# Patient Record
Sex: Female | Born: 1945 | Race: Black or African American | Hispanic: No | Marital: Single | Smoking: Never smoker
Health system: Southern US, Community
[De-identification: ages and names within clinical notes are randomized; demographics above are authoritative.]

## PROBLEM LIST (undated history)

## (undated) DIAGNOSIS — F419 Anxiety disorder, unspecified: Secondary | ICD-10-CM

## (undated) DIAGNOSIS — I499 Cardiac arrhythmia, unspecified: Secondary | ICD-10-CM

## (undated) DIAGNOSIS — K219 Gastro-esophageal reflux disease without esophagitis: Secondary | ICD-10-CM

## (undated) DIAGNOSIS — R011 Cardiac murmur, unspecified: Secondary | ICD-10-CM

## (undated) DIAGNOSIS — F32A Depression, unspecified: Secondary | ICD-10-CM

## (undated) DIAGNOSIS — G4730 Sleep apnea, unspecified: Secondary | ICD-10-CM

## (undated) DIAGNOSIS — E785 Hyperlipidemia, unspecified: Secondary | ICD-10-CM

## (undated) DIAGNOSIS — I10 Essential (primary) hypertension: Secondary | ICD-10-CM

## (undated) DIAGNOSIS — F329 Major depressive disorder, single episode, unspecified: Secondary | ICD-10-CM

## (undated) DIAGNOSIS — M109 Gout, unspecified: Secondary | ICD-10-CM

## (undated) HISTORY — DX: Major depressive disorder, single episode, unspecified: F32.9

## (undated) HISTORY — DX: Sleep apnea, unspecified: G47.30

## (undated) HISTORY — DX: Anxiety disorder, unspecified: F41.9

## (undated) HISTORY — PX: BREAST EXCISIONAL BIOPSY: SUR124

## (undated) HISTORY — DX: Gastro-esophageal reflux disease without esophagitis: K21.9

## (undated) HISTORY — DX: Gout, unspecified: M10.9

## (undated) HISTORY — DX: Essential (primary) hypertension: I10

## (undated) HISTORY — PX: ABDOMINAL HYSTERECTOMY: SHX81

## (undated) HISTORY — PX: CORNEAL TRANSPLANT: SHX108

## (undated) HISTORY — DX: Depression, unspecified: F32.A

## (undated) HISTORY — DX: Hyperlipidemia, unspecified: E78.5

---

## 1984-02-27 HISTORY — PX: PARTIAL HYSTERECTOMY: SHX80

## 2001-04-25 ENCOUNTER — Other Ambulatory Visit: Admission: RE | Admit: 2001-04-25 | Discharge: 2001-04-25 | Payer: Self-pay | Admitting: Obstetrics and Gynecology

## 2001-12-29 ENCOUNTER — Encounter: Admission: RE | Admit: 2001-12-29 | Discharge: 2002-03-29 | Payer: Self-pay | Admitting: Family Medicine

## 2002-06-22 ENCOUNTER — Other Ambulatory Visit: Admission: RE | Admit: 2002-06-22 | Discharge: 2002-06-22 | Payer: Self-pay | Admitting: Obstetrics and Gynecology

## 2003-07-02 ENCOUNTER — Other Ambulatory Visit: Admission: RE | Admit: 2003-07-02 | Discharge: 2003-07-02 | Payer: Self-pay | Admitting: Obstetrics and Gynecology

## 2004-07-07 ENCOUNTER — Other Ambulatory Visit: Admission: RE | Admit: 2004-07-07 | Discharge: 2004-07-07 | Payer: Self-pay | Admitting: Obstetrics and Gynecology

## 2004-07-07 ENCOUNTER — Ambulatory Visit (HOSPITAL_COMMUNITY): Admission: RE | Admit: 2004-07-07 | Discharge: 2004-07-07 | Payer: Self-pay | Admitting: Obstetrics and Gynecology

## 2005-02-26 HISTORY — PX: BACK SURGERY: SHX140

## 2005-07-10 ENCOUNTER — Encounter: Admission: RE | Admit: 2005-07-10 | Discharge: 2005-07-10 | Payer: Self-pay | Admitting: Family Medicine

## 2005-07-10 ENCOUNTER — Ambulatory Visit (HOSPITAL_COMMUNITY): Admission: RE | Admit: 2005-07-10 | Discharge: 2005-07-10 | Payer: Self-pay | Admitting: Obstetrics and Gynecology

## 2005-07-12 ENCOUNTER — Other Ambulatory Visit: Admission: RE | Admit: 2005-07-12 | Discharge: 2005-07-12 | Payer: Self-pay | Admitting: Obstetrics and Gynecology

## 2005-09-06 ENCOUNTER — Encounter: Admission: RE | Admit: 2005-09-06 | Discharge: 2005-09-06 | Payer: Self-pay | Admitting: Family Medicine

## 2005-09-14 ENCOUNTER — Ambulatory Visit (HOSPITAL_COMMUNITY): Admission: RE | Admit: 2005-09-14 | Discharge: 2005-09-16 | Payer: Self-pay | Admitting: Neurosurgery

## 2006-07-23 ENCOUNTER — Ambulatory Visit (HOSPITAL_COMMUNITY): Admission: RE | Admit: 2006-07-23 | Discharge: 2006-07-23 | Payer: Self-pay | Admitting: Obstetrics and Gynecology

## 2006-08-20 ENCOUNTER — Ambulatory Visit: Payer: Self-pay | Admitting: Gastroenterology

## 2006-08-28 ENCOUNTER — Encounter: Admission: RE | Admit: 2006-08-28 | Discharge: 2006-08-28 | Payer: Self-pay | Admitting: Obstetrics and Gynecology

## 2006-09-02 ENCOUNTER — Encounter: Payer: Self-pay | Admitting: Gastroenterology

## 2006-09-02 ENCOUNTER — Ambulatory Visit: Payer: Self-pay | Admitting: Gastroenterology

## 2007-07-29 ENCOUNTER — Encounter: Admission: RE | Admit: 2007-07-29 | Discharge: 2007-07-29 | Payer: Self-pay | Admitting: Obstetrics and Gynecology

## 2008-08-03 ENCOUNTER — Encounter: Admission: RE | Admit: 2008-08-03 | Discharge: 2008-08-03 | Payer: Self-pay | Admitting: Obstetrics and Gynecology

## 2009-09-14 ENCOUNTER — Encounter: Admission: RE | Admit: 2009-09-14 | Discharge: 2009-09-14 | Payer: Self-pay | Admitting: Obstetrics and Gynecology

## 2009-12-19 ENCOUNTER — Encounter: Admission: RE | Admit: 2009-12-19 | Discharge: 2009-12-19 | Payer: Self-pay | Admitting: Family Medicine

## 2010-01-24 DIAGNOSIS — K219 Gastro-esophageal reflux disease without esophagitis: Secondary | ICD-10-CM | POA: Insufficient documentation

## 2010-02-14 ENCOUNTER — Ambulatory Visit: Payer: Self-pay | Admitting: Cardiovascular Disease

## 2010-02-14 LAB — LIPID PANEL
Cholesterol: 217 mg/dL — AB (ref 0–200)
LDL Cholesterol: 135 mg/dL
Triglycerides: 103 mg/dL (ref 40–160)

## 2010-05-03 ENCOUNTER — Encounter: Payer: Self-pay | Admitting: Cardiovascular Disease

## 2010-05-03 DIAGNOSIS — E785 Hyperlipidemia, unspecified: Secondary | ICD-10-CM | POA: Insufficient documentation

## 2010-05-03 DIAGNOSIS — F329 Major depressive disorder, single episode, unspecified: Secondary | ICD-10-CM | POA: Insufficient documentation

## 2010-05-03 DIAGNOSIS — E1159 Type 2 diabetes mellitus with other circulatory complications: Secondary | ICD-10-CM | POA: Insufficient documentation

## 2010-05-03 DIAGNOSIS — E119 Type 2 diabetes mellitus without complications: Secondary | ICD-10-CM | POA: Insufficient documentation

## 2010-05-03 DIAGNOSIS — I10 Essential (primary) hypertension: Secondary | ICD-10-CM | POA: Insufficient documentation

## 2010-05-03 DIAGNOSIS — F32A Depression, unspecified: Secondary | ICD-10-CM | POA: Insufficient documentation

## 2010-05-26 ENCOUNTER — Ambulatory Visit: Payer: Self-pay | Admitting: Cardiovascular Disease

## 2010-06-28 ENCOUNTER — Encounter: Payer: Self-pay | Admitting: Cardiovascular Disease

## 2010-07-14 NOTE — Op Note (Signed)
NAMECHEE, DIMON            ACCOUNT NO.:  [id number]   MEDICAL RECORD NO.:  [id number]          PATIENT TYPE:  OIB   LOCATION:  3172                         FACILITY:  [facility]   PHYSICIAN:  Hilda Lias, M.D.   DATE OF BIRTH:  1945-03-29   DATE OF PROCEDURE:  09/14/2005  DATE OF DISCHARGE:                                 OPERATIVE REPORT   HISTORY:  Ms. Wander is a lady who came to see me in my office yesterday  because of back pain, distal both legs up to the point that she was losing  control of the left foot.  Also, she was having some difficulty with bowel  and bladder.  The patient had MRI and because of the findings, we decided to  go ahead with surgery.   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   PROCEDURE:  1.  L4-L5 laminotomy bilaterally.  2.  Bilateral L4-L5 diskectomy.  3.  Foraminotomy.   SURGEON:  Hilda Lias, M.D.   DESCRIPTION OF PROCEDURE:  The patient was taken to the OR.  She was  positioned on operating room table.  The back was sprayed with DuraPrep.  A  midline incision was made from L4 to L5.  Muscles were retracted laterally.  X-rays showed that indeed we were at the L4-L5 level.  We brought the  microscope into the area.  The canal was quite vertical.  Still using  Kerrison punch we drilled the lower lamina of L4 and the upper of L5.  A  thick yellow ligament was also excised.  Immediately once we saw the dural  sac, it was really tight, it ws difficult to get into the disk space.  Finally, after retraction, we were able to displace slightly the L5 nerve  root medially and we entered the disk space.  A large amount of  degenerative disk was removed.  There was a large osteophyte.  Then we went  to the right side and a L4-L5 foraminotomy was accomplished.  Retraction was  done and then a  large amount of disk was removed.  Having done this, we went  once more into the left side.  Now we have more easily access to the midline  and total gross diskectomy was accomplished.  Foraminotomy was done and  there was plenty of room not only for the thecal sac but also for the L4 and  L5 nerve root.  Valsalva maneuver was done.  Fentanyl and Depo-Medrol were  left in the dural space and the wound was closed with Vicryl and Steri-  Strips.           ______________________________  Hilda Lias, M.D.     EB/MEDQ  D:  09/14/2005  T:  09/14/2005  Job:  [id number]   cc:   Dante Gang, MD

## 2010-09-13 ENCOUNTER — Other Ambulatory Visit: Payer: Self-pay | Admitting: Cardiovascular Disease

## 2010-09-14 ENCOUNTER — Other Ambulatory Visit: Payer: Self-pay | Admitting: *Deleted

## 2010-09-14 MED ORDER — NIACIN ER (ANTIHYPERLIPIDEMIC) 1000 MG PO TBCR: 1000.0000 mg | EXTENDED_RELEASE_TABLET | Freq: Every day | ORAL | Status: DC

## 2010-09-14 NOTE — Telephone Encounter (Signed)
Fax received from pharmacy. Refill completed. Jodette Jenise Iannelli RN  

## 2010-09-27 ENCOUNTER — Other Ambulatory Visit: Payer: Self-pay | Admitting: Obstetrics and Gynecology

## 2010-09-27 DIAGNOSIS — Z1231 Encounter for screening mammogram for malignant neoplasm of breast: Secondary | ICD-10-CM

## 2010-10-09 ENCOUNTER — Ambulatory Visit
Admission: RE | Admit: 2010-10-09 | Discharge: 2010-10-09 | Disposition: A | Discharge status: Home / Self Care | Payer: Medicare Other | Source: Ambulatory Visit | Attending: Obstetrics and Gynecology | Admitting: Obstetrics and Gynecology

## 2010-10-09 DIAGNOSIS — Z1231 Encounter for screening mammogram for malignant neoplasm of breast: Secondary | ICD-10-CM

## 2010-10-25 ENCOUNTER — Ambulatory Visit: Payer: Self-pay | Admitting: Cardiovascular Disease

## 2011-10-15 ENCOUNTER — Other Ambulatory Visit: Payer: Self-pay | Admitting: Obstetrics and Gynecology

## 2011-10-15 DIAGNOSIS — Z1231 Encounter for screening mammogram for malignant neoplasm of breast: Secondary | ICD-10-CM

## 2011-11-01 ENCOUNTER — Ambulatory Visit
Admission: RE | Admit: 2011-11-01 | Discharge: 2011-11-01 | Disposition: A | Discharge status: Home / Self Care | Payer: Medicare Other | Source: Ambulatory Visit | Attending: Obstetrics and Gynecology | Admitting: Obstetrics and Gynecology

## 2011-11-01 DIAGNOSIS — Z1231 Encounter for screening mammogram for malignant neoplasm of breast: Secondary | ICD-10-CM

## 2011-11-09 ENCOUNTER — Encounter: Payer: Self-pay | Admitting: Obstetrics and Gynecology

## 2011-11-13 ENCOUNTER — Encounter: Payer: Self-pay | Admitting: Obstetrics and Gynecology

## 2011-11-14 ENCOUNTER — Ambulatory Visit (INDEPENDENT_AMBULATORY_CARE_PROVIDER_SITE_OTHER): Payer: Medicare Other | Admitting: Obstetrics and Gynecology

## 2011-11-14 ENCOUNTER — Encounter: Payer: Self-pay | Admitting: Obstetrics and Gynecology

## 2011-11-14 VITALS — BP 108/64 | HR 86 | Ht 60.0 in | Wt 180.0 lb

## 2011-11-14 DIAGNOSIS — Z124 Encounter for screening for malignant neoplasm of cervix: Secondary | ICD-10-CM

## 2011-11-14 NOTE — Progress Notes (Signed)
Last Pap: 07/2008 WNL: Yes Regular Periods:no Contraception: hyst/postmenopause  Monthly Breast exam:yes Tetanus<40yrs:no Nl.Bladder Function:yes Daily BMs:yes Healthy Diet:yes Calcium:yes Mammogram:yes Date of Mammogram: 11/02/11 Exercise:yes Have often Exercise: 2-3 times per week  Seatbelt: yes Abuse at home: no Stressful work:no Sigmoid-colonoscopy: 2 years ago Bone Density: Yes 2 years ago PCP: Dr. Dario Guardian in [place] Change in PMH: none Change in WJX:BJYN BP 108/64  Pulse 86  Ht 5' (1.524 m)  Wt 180 lb (81.647 kg)  BMI 35.15 kg/m2 Physical Examination: Neck - supple, no significant adenopathy Chest - clear to auscultation, no wheezes, rales or rhonchi, symmetric air entry Heart - normal rate, regular rhythm, normal S1, S2, no murmurs, rubs, clicks or gallops Abdomen - soft, nontender, nondistended, no masses or organomegaly Breasts - breasts appear normal, no suspicious masses, no skin or nipple changes or axillary nodes Pelvic - normal external genitalia, vulva, vagina, cervix, uterus and adnexa, atrophic Rectal - normal rectal, no masses Musculoskeletal - no joint tenderness, deformity or swelling Extremities - peripheral pulses normal, no pedal edema, no clubbing or cyanosis Skin - normal coloration and turgor, no rashes, no suspicious skin lesions noted Normal AEX Pt due for mammogram no colonoscopy due no Pap done yes last one if normal RT one year Diet and exercise discussed Pt has some skin lightening on her face.  Pt referred to dermatology

## 2011-11-14 NOTE — Patient Instructions (Signed)
Menopause and Herbal Products Menopause is the normal time of life when menstrual periods stop completely. Menopause is complete when you have missed 12 consecutive menstrual periods. It usually occurs between the ages of 48 to 55, with an average age of 51. Very rarely does a woman develop menopause before 65 years old. At menopause, your ovaries stop producing the female hormones, estrogen and progesterone. This can cause undesirable symptoms and also affect your health. Sometimes the symptoms can occur 4 to 5 years before the menopause begins. There is no relationship between menopause and:  Oral contraceptives.   Number of children you had.   Race.   The age your menstrual periods started (menarche).  Heavy smokers and very thin women may develop menopause earlier in life. Estrogen and progesterone hormone treatment is the usual method of treating menopausal symptoms. However, there are women who should not take hormone treatment. This is true of:   Women that have breast or uterine cancer.   Women who prefer not to take hormones because of certain side effects (abnormal uterine bleeding).   Women who are afraid that hormones may cause breast cancer.   Women who have a history of liver disease, heart disease, stroke, or blood clots.  For these women, there are other medications that may help treat their menopausal symptoms. These medications are found in plants and botanical products. They can be found in the form of herbs, teas, oils, tinctures, and pills.  CAUSES:  The ovaries stop producing the female hormones estrogen and progesterone.   Other causes include:   Surgery to remove both ovaries.   The ovaries stop functioning for no know reason.   Tumors of the pituitary gland in the brain.   Medical disease that affects the ovaries and hormone production.   Radiation treatment to the abdomen or pelvis.   Chemotherapy that affects the ovaries.   PHYTOESTROGENS: Phytoestrogen's occur naturally in plants and plant products. They act like estrogen in the body. Herbal medications are made from these plants and botanical steroids. There are 3 types of phytoestrogens:  Isoflavones (genistein and daidzein) are found in soy, garbanzo beans, miso and tofu foods.   Ligins are found in the shell of seeds. They are used to make oils like flaxseed oil. The bacteria in your intestine act on these foods to produce the estrogen-like hormones.   Coumestans are estrogen-like. Some of the foods they are found in include sunflower seeds and bean sprouts.  CONDITIONS AND THERE POSSIBLE HERBAL TREATMENT:  Hot flashes and night sweats.   Soy, black cohosh and evening primrose.   Irritability, insomnia, depression and memory problems.   Chasteberry, ginseng, and soy.   St. John's wort may be helpful for depression. However, there is a concern of it causing cataracts of the eye and may have bad effects on other medications. St. John's wort should not be taken for long time and without your caregiver's advice.   Loss of libido and vaginal and skin dryness.   Wild yam and soy.   Prevention of coronary heart disease and osteoporosis.   Soy and Isoflavones.  Several studies have shown that some women benefit from herbal medications, but most of the studies have not consistently shown that these supplements are much better than placebo. Other forms of treatment to help women with menopausal symptoms include a balanced diet, rest, exercise, vitamin and calcium (with vitamin D) supplements, acupuncture, and group therapy when necessary. THOSE WHO SHOULD NOT TAKE HERBAL MEDICATIONS INCLUDE:    Women who are planning on getting pregnant unless told by your caregiver.   Women who are breastfeeding unless told by your caregiver.   Women who are taking other prescription medications unless told by your caregiver.   Infants, children, and elderly women unless  told by your caregiver.  Different herbal medications have different and unmeasured amounts of the herbal ingredients. There are no regulations, quality control, and standardization of the ingredients in herbal medications. Therefore, the amount of the ingredient in the medication may vary from one herb, pill, tea, oil or tincture to another. Many herbal medications can cause serious problems and can even have poisonous effects if taken too much or too long. If problems develop, the medication should be stopped and recorded by your caregiver. HOME CARE INSTRUCTIONS  Do not take or give children herbal medications without your caregiver's advice.   Let your caregiver know all the medications you are taking. This includes prescription, over-the-counter, eye drops, and creams.   Do not take herbal medications longer or more than recommended.   Tell your caregiver about any side effects from the medication.  SEEK MEDICAL CARE IF:  You develop a fever of 102 F (38.9 C), or as directed by your caregiver.   You feel sick to your stomach (nauseous), vomit, or have diarrhea.   You develop a rash.   You develop abdominal pain.   You develop severe headaches.   You start to have vision problems.   You feel dizzy or faint.   You start to feel numbness in any part of your body.   You start shaking (have convulsions).  Document Released: 08/01/2007 Document Revised: 02/01/2011 Document Reviewed: 02/28/2010 [employer] Patient Information 2012 [employer]. 

## 2011-11-16 LAB — PAP IG W/ RFLX HPV ASCU

## 2011-12-04 ENCOUNTER — Telehealth: Payer: Self-pay

## 2011-12-04 NOTE — Telephone Encounter (Signed)
Message copied by Rolla Plate on Tue Dec 04, 2011 12:56 PM ------      Message from: Jaymes Graff      Created: Sun Dec 02, 2011  9:24 PM       Please tell pt BV was found on her pap smear.  She can be treated with either Metrogel one applicator in vagina QHS for five nights or Flagyl 500mg  one tablet twice a day for seven days.

## 2011-12-04 NOTE — Telephone Encounter (Signed)
Lm on vm tcb rgd labs 

## 2011-12-05 NOTE — Telephone Encounter (Signed)
Lm on vm tcb rgd labs 

## 2011-12-06 NOTE — Telephone Encounter (Signed)
Lm on vm tcb rgd labs 

## 2011-12-07 MED ORDER — METRONIDAZOLE 500 MG PO TABS: 500.0000 mg | ORAL_TABLET | Freq: Two times a day (BID) | ORAL | Status: DC

## 2011-12-07 NOTE — Telephone Encounter (Signed)
Lm on vm tcb rgd labs 

## 2011-12-07 NOTE — Telephone Encounter (Signed)
Spoke with pt rgd pap results informed bv on pap need rx advised pt rx sent to pharm pt voice understanding

## 2012-10-08 ENCOUNTER — Other Ambulatory Visit: Payer: Self-pay

## 2012-10-08 DIAGNOSIS — Z1231 Encounter for screening mammogram for malignant neoplasm of breast: Secondary | ICD-10-CM

## 2012-11-03 ENCOUNTER — Ambulatory Visit
Admission: RE | Admit: 2012-11-03 | Discharge: 2012-11-03 | Disposition: A | Discharge status: Home / Self Care | Payer: Medicare Other | Source: Ambulatory Visit

## 2012-11-03 DIAGNOSIS — Z1231 Encounter for screening mammogram for malignant neoplasm of breast: Secondary | ICD-10-CM

## 2013-04-06 ENCOUNTER — Encounter (HOSPITAL_COMMUNITY): Payer: Self-pay | Admitting: Emergency Medicine

## 2013-04-06 ENCOUNTER — Emergency Department (HOSPITAL_COMMUNITY)
Admission: EM | Admit: 2013-04-06 | Discharge: 2013-04-06 | Disposition: A | Discharge status: Home / Self Care | Payer: No Typology Code available for payment source | Attending: Emergency Medicine | Admitting: Emergency Medicine

## 2013-04-06 ENCOUNTER — Emergency Department (HOSPITAL_COMMUNITY): Payer: No Typology Code available for payment source

## 2013-04-06 DIAGNOSIS — E785 Hyperlipidemia, unspecified: Secondary | ICD-10-CM | POA: Diagnosis not present

## 2013-04-06 DIAGNOSIS — I10 Essential (primary) hypertension: Secondary | ICD-10-CM | POA: Insufficient documentation

## 2013-04-06 DIAGNOSIS — Z79899 Other long term (current) drug therapy: Secondary | ICD-10-CM | POA: Diagnosis not present

## 2013-04-06 DIAGNOSIS — F329 Major depressive disorder, single episode, unspecified: Secondary | ICD-10-CM | POA: Diagnosis not present

## 2013-04-06 DIAGNOSIS — IMO0002 Reserved for concepts with insufficient information to code with codable children: Secondary | ICD-10-CM | POA: Insufficient documentation

## 2013-04-06 DIAGNOSIS — E119 Type 2 diabetes mellitus without complications: Secondary | ICD-10-CM | POA: Insufficient documentation

## 2013-04-06 DIAGNOSIS — S060X9A Concussion with loss of consciousness of unspecified duration, initial encounter: Secondary | ICD-10-CM

## 2013-04-06 DIAGNOSIS — F3289 Other specified depressive episodes: Secondary | ICD-10-CM | POA: Insufficient documentation

## 2013-04-06 DIAGNOSIS — F411 Generalized anxiety disorder: Secondary | ICD-10-CM | POA: Diagnosis not present

## 2013-04-06 DIAGNOSIS — Y9389 Activity, other specified: Secondary | ICD-10-CM | POA: Insufficient documentation

## 2013-04-06 DIAGNOSIS — Y92410 Unspecified street and highway as the place of occurrence of the external cause: Secondary | ICD-10-CM | POA: Diagnosis not present

## 2013-04-06 DIAGNOSIS — F29 Unspecified psychosis not due to a substance or known physiological condition: Secondary | ICD-10-CM | POA: Insufficient documentation

## 2013-04-06 DIAGNOSIS — S060XAA Concussion with loss of consciousness status unknown, initial encounter: Secondary | ICD-10-CM

## 2013-04-06 DIAGNOSIS — S060X0A Concussion without loss of consciousness, initial encounter: Secondary | ICD-10-CM | POA: Diagnosis not present

## 2013-04-06 DIAGNOSIS — S0990XA Unspecified injury of head, initial encounter: Secondary | ICD-10-CM | POA: Diagnosis present

## 2013-04-06 LAB — CBC WITH DIFFERENTIAL/PLATELET
Basophils Absolute: 0 10*3/uL (ref 0.0–0.1)
Basophils Relative: 1 % (ref 0–1)
Eosinophils Absolute: 0.4 10*3/uL (ref 0.0–0.7)
Eosinophils Relative: 6 % — ABNORMAL HIGH (ref 0–5)
HCT: 40.5 % (ref 36.0–46.0)
Hemoglobin: 13.5 g/dL (ref 12.0–15.0)
LYMPHS ABS: 2.9 10*3/uL (ref 0.7–4.0)
LYMPHS PCT: 45 % (ref 12–46)
MCH: 31.1 pg (ref 26.0–34.0)
MCHC: 33.3 g/dL (ref 30.0–36.0)
MCV: 93.3 fL (ref 78.0–100.0)
Monocytes Absolute: 0.6 10*3/uL (ref 0.1–1.0)
Monocytes Relative: 9 % (ref 3–12)
NEUTROS PCT: 40 % — AB (ref 43–77)
Neutro Abs: 2.6 10*3/uL (ref 1.7–7.7)
PLATELETS: 389 10*3/uL (ref 150–400)
RBC: 4.34 MIL/uL (ref 3.87–5.11)
RDW: 14.8 % (ref 11.5–15.5)
WBC: 6.4 10*3/uL (ref 4.0–10.5)

## 2013-04-06 LAB — COMPREHENSIVE METABOLIC PANEL
ALK PHOS: 73 U/L (ref 39–117)
ALT: 28 U/L (ref 0–35)
AST: 31 U/L (ref 0–37)
Albumin: 3.9 g/dL (ref 3.5–5.2)
BUN: 12 mg/dL (ref 6–23)
CHLORIDE: 104 meq/L (ref 96–112)
CO2: 22 meq/L (ref 19–32)
Calcium: 9.5 mg/dL (ref 8.4–10.5)
Creatinine, Ser: 0.78 mg/dL (ref 0.50–1.10)
GFR calc Af Amer: >90 mL/min (ref >90)
GFR calc non Af Amer: 85 mL/min — ABNORMAL LOW (ref >90)
Glucose, Bld: 176 mg/dL — ABNORMAL HIGH (ref 70–99)
Potassium: 4.7 mEq/L (ref 3.7–5.3)
SODIUM: 142 meq/L (ref 137–147)
Total Bilirubin: 0.4 mg/dL (ref 0.3–1.2)
Total Protein: 7.8 g/dL (ref 6.0–8.3)

## 2013-04-06 LAB — ETHANOL: Alcohol, Ethyl (B): <11 mg/dL (ref 0–11)

## 2013-04-06 MED ORDER — SODIUM CHLORIDE 0.9 % IV BOLUS (SEPSIS)
1000.0000 mL | Freq: Once | INTRAVENOUS | Status: AC
  Administered 2013-04-06: 1000 mL via INTRAVENOUS

## 2013-04-06 MED ORDER — DIAZEPAM 5 MG PO TABS
5.0000 mg | ORAL_TABLET | Freq: Once | ORAL | Status: AC
  Administered 2013-04-06: 5 mg via ORAL
  Filled 2013-04-06: qty 1

## 2013-04-06 MED ORDER — HYDROCODONE-ACETAMINOPHEN 5-325 MG PO TABS: 1.0000 | ORAL_TABLET | ORAL | Status: DC | PRN

## 2013-04-06 MED ORDER — KETOROLAC TROMETHAMINE 30 MG/ML IJ SOLN
30.0000 mg | Freq: Once | INTRAMUSCULAR | Status: AC
  Administered 2013-04-06: 30 mg via INTRAVENOUS
  Filled 2013-04-06: qty 1

## 2013-04-06 NOTE — ED Notes (Signed)
Dr. Floyd at the bedside.  

## 2013-04-06 NOTE — ED Notes (Signed)
Pt to CT

## 2013-04-06 NOTE — ED Provider Notes (Signed)
I saw and evaluated the patient, reviewed the resident's note and I agree with the findings and plan.  EKG Interpretation   None       Mental status improving.  Patient discharged home.  Likely concussion.  CT head C-spine normal per chest x-ray normal.  Repeat abdominal exam is benign.  Ambulatory  Hoy Morn, MD 04/06/13 [phone]
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
## 2013-04-06 NOTE — Discharge Instructions (Signed)

## 2013-04-06 NOTE — ED Notes (Signed)
Pt ambulated with mim assistance.
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
## 2013-04-06 NOTE — ED Notes (Signed)
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

## 2013-04-06 NOTE — ED Notes (Signed)
Pt ambulating in room with asst.

## 2013-12-23 ENCOUNTER — Other Ambulatory Visit: Payer: Self-pay

## 2013-12-23 DIAGNOSIS — Z1231 Encounter for screening mammogram for malignant neoplasm of breast: Secondary | ICD-10-CM

## 2013-12-28 ENCOUNTER — Encounter (HOSPITAL_COMMUNITY): Payer: Self-pay | Admitting: Emergency Medicine

## 2014-01-08 ENCOUNTER — Ambulatory Visit
Admission: RE | Admit: 2014-01-08 | Discharge: 2014-01-08 | Disposition: A | Discharge status: Home / Self Care | Payer: Medicare Other | Source: Ambulatory Visit

## 2014-01-08 ENCOUNTER — Encounter (INDEPENDENT_AMBULATORY_CARE_PROVIDER_SITE_OTHER): Payer: Self-pay

## 2014-01-08 DIAGNOSIS — Z1231 Encounter for screening mammogram for malignant neoplasm of breast: Secondary | ICD-10-CM

## 2014-08-05 DIAGNOSIS — Z947 Corneal transplant status: Secondary | ICD-10-CM | POA: Insufficient documentation

## 2015-09-09 ENCOUNTER — Ambulatory Visit: Payer: Medicare Other | Attending: Internal Medicine

## 2015-09-09 DIAGNOSIS — R0683 Snoring: Secondary | ICD-10-CM | POA: Diagnosis not present

## 2015-09-09 DIAGNOSIS — G4733 Obstructive sleep apnea (adult) (pediatric): Secondary | ICD-10-CM | POA: Insufficient documentation

## 2016-08-16 ENCOUNTER — Encounter: Payer: Self-pay | Admitting: Gastroenterology

## 2017-04-23 ENCOUNTER — Encounter: Payer: Self-pay | Admitting: Urology

## 2017-04-23 ENCOUNTER — Ambulatory Visit (INDEPENDENT_AMBULATORY_CARE_PROVIDER_SITE_OTHER): Payer: Medicare Other | Admitting: Urology

## 2017-04-23 VITALS — BP 155/79 | HR 76 | Ht 60.0 in | Wt 185.0 lb

## 2017-04-23 DIAGNOSIS — N3944 Nocturnal enuresis: Secondary | ICD-10-CM

## 2017-04-23 DIAGNOSIS — R351 Nocturia: Secondary | ICD-10-CM

## 2017-04-23 LAB — BLADDER SCAN AMB NON-IMAGING: Scan Result: 0

## 2017-04-23 NOTE — Progress Notes (Signed)
04/23/2017 3:49 PM   Madeline Martinez 07-Nov-1945 [id number]  Referring provider: [name], [name], [place] [address]  Chief Complaint  Patient presents with  . New Patient (Initial Visit)    HPI: Patient is a 72 -year-old African-American female who is referred to [place] by Dr.[name] for urinary incontinence with her daughter, Madeline Martinez.    She is not a clear historian.  She has difficulty with the specifics of her condition.  Patient states that she has had urinary incontinence for awhile.  Patient has incontinence at night.  She states she just wakes up wet.  She denies any issues during the day.  She is experiencing a strong urgency and nocturia x 2.    Her incontinence volume is moderate to large.   She states that she has not had any night time incontinence for the last two weeks.  Her recent HbgA1c was found to be 10% which is up from 7% three months ago.  She has had some recent changes with her DM medications.    She was told by her gynecologist that she had an UTI, but she denied any symptoms of dysuria, fevers, gross hematuria.  Patient denies any gross hematuria, dysuria or suprapubic/flank pain.  Patient denies any fevers, chills, nausea or vomiting.   She is post menopausal.   She denies constipation and/or diarrhea.   She has not had any recent imaging studies.    She is drinking a lot of water daily into the night.  She is drinking two caffeinated beverages daily.  She is not drinking alcoholic beverages daily.   She has sleep apnea, but she does admit that she sometimes does not sleep with her CPAP machine.     Her PVR is 0 mL.     PMH: Past Medical History:  Diagnosis Date  . Acid reflux   . Anxiety   . Depression   . Diabetes mellitus   . Gout   . HTN (hypertension)   . Hyperlipidemia   . Sleep apnea     Surgical History: Past Surgical History:  Procedure Laterality Date  . ABDOMINAL HYSTERECTOMY    . BACK SURGERY  2007   DISCS   . 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 PARTIAL HYSTERECTOMY  1986    Home Medications:  Allergies as of 04/23/2017   No Known Allergies     Medication List        Accurate as of 04/23/17  3:49 PM. Always use your most recent med list.          albuterol 108 (90 Base) MCG/ACT inhaler Commonly known as:  PROVENTIL HFA;VENTOLIN HFA Inhale 2 puffs into the lungs every 6 (six) hours as needed for wheezing or shortness of breath.   allopurinol 100 MG tablet Commonly known as:  ZYLOPRIM allopurinol 100 mg tablet   atorvastatin 80 MG tablet Commonly known as:  LIPITOR Take 80 mg by mouth daily.   buPROPion 150 MG 24 hr tablet Commonly known as:  WELLBUTRIN XL Take 300 mg by mouth daily.   carvedilol 25 MG tablet Commonly known as:  COREG Take 25 mg by mouth 2 (two) times daily.   cetirizine 10 MG tablet Commonly known as:  ZYRTEC   colchicine 0.6 MG tablet Take 0.6 mg by mouth 2 (two) times daily as needed (gout).   diltiazem 180 MG 24 hr capsule Commonly known as:  DILACOR XR Take 180 mg by mouth daily.  DULCOLAX PO Take 1 tablet by mouth daily as needed (constipation).   fluticasone 50 MCG/ACT nasal spray Commonly known as:  FLONASE Place 2 sprays into the nose as needed for allergies.   glyBURIDE 5 MG tablet Commonly known as:  DIABETA Take 10 mg by mouth 2 (two) times daily.   HYDROcodone-acetaminophen 5-325 MG tablet Commonly known as:  NORCO/VICODIN Take 1 tablet by mouth every 4 (four) hours as needed.   LINZESS 145 MCG Caps capsule Generic drug:  linaclotide Take 145 mcg by mouth daily.   liraglutide 18 MG/3ML Sopn Commonly known as:  VICTOZA Inject into the skin.   losartan 100 MG tablet Commonly known as:  COZAAR Take 100 mg by mouth daily.   MELATONIN PO Take 1 tablet by mouth at bedtime.   metFORMIN 1000 MG tablet Commonly known as:  GLUCOPHAGE Take 1,000 mg by mouth 2 (two) times daily.   methocarbamol 750 MG  tablet Commonly known as:  ROBAXIN methocarbamol 750 mg tablet   mirtazapine 15 MG tablet Commonly known as:  REMERON mirtazapine 15 mg tablet   multivitamin with minerals Tabs tablet Take 1 tablet by mouth daily.   nabumetone 750 MG tablet Commonly known as:  RELAFEN Take 750 mg by mouth daily.   ONGLYZA 5 MG Tabs tablet Generic drug:  saxagliptin HCl Onglyza 5 mg tablet   OVER THE COUNTER MEDICATION Place 1 drop into both eyes daily as needed (dry eyes). Eye drops   PROBIOTIC PO Take 1 tablet by mouth daily.   VITAMIN D PO Take 1 tablet by mouth daily.       Allergies: No Known Allergies  Family History: Family History  Problem Relation Age of Onset  . Heart failure Father   . Breast cancer Mother   . Other Sister        DEGEN.DISC DISEASE  . Kidney cancer Neg Hx   . Kidney disease Neg Hx   . Prostate cancer Neg Hx     Social History:  reports that  has never smoked. she has never used smokeless tobacco. She reports that she does not drink alcohol or use drugs.  ROS: UROLOGY Frequent Urination?: No Hard to postpone urination?: Yes Burning/pain with urination?: No Get up at night to urinate?: No Leakage of urine?: No Urine stream starts and stops?: No Trouble starting stream?: No Do you have to strain to urinate?: No Blood in urine?: No Urinary tract infection?: Yes Sexually transmitted disease?: No Injury to kidneys or bladder?: No Painful intercourse?: No Weak stream?: No Currently pregnant?: No Vaginal bleeding?: No Last menstrual period?: n  Gastrointestinal Nausea?: No Vomiting?: No Indigestion/heartburn?: No Diarrhea?: No Constipation?: No  Constitutional Fever: No Night sweats?: No Weight loss?: No Fatigue?: No  Skin Skin rash/lesions?: No Itching?: Yes  Eyes Blurred vision?: No Double vision?: No  Ears/Nose/Throat Sore throat?: No Sinus problems?: Yes  Hematologic/Lymphatic Swollen glands?: No Easy bruising?:  No  Cardiovascular Leg swelling?: No Chest pain?: No  Respiratory Cough?: No Shortness of breath?: No  Endocrine Excessive thirst?: No  Musculoskeletal Back pain?: Yes Joint pain?: No  Neurological Headaches?: No Dizziness?: No  Psychologic Depression?: Yes Anxiety?: Yes  Physical Exam: BP (!) 155/79   Pulse 76   Ht 5' (1.524 m)   Wt 185 lb (83.9 kg)   BMI 36.13 kg/m   Constitutional: Well nourished. Alert and oriented, No acute distress. HEENT: [place] AT, moist mucus membranes. Trachea midline, no masses. Cardiovascular: No clubbing, cyanosis, or edema. Respiratory: Normal  respiratory effort, no increased work of breathing. GI: Abdomen is soft, non tender, non distended, no abdominal masses. Liver and spleen not palpable.  No hernias appreciated.  Stool sample for occult testing is not indicated.   GU: No CVA tenderness.  No bladder fullness or masses.  Normal external genitalia, normal pubic hair distribution, no lesions.  Normal urethral meatus, no lesions, no prolapse, no discharge.   No urethral masses, tenderness and/or tenderness. No bladder fullness, tenderness or masses. Normal vagina mucosa, good estrogen effect, no discharge, no lesions, good pelvic support, no cystocele or rectocele noted.  Cervix, uterus and adnexa are surgically absent.  Anus and perineum are without rashes or lesions.    Skin: No rashes, bruises or suspicious lesions. Lymph: No cervical or inguinal adenopathy. Neurologic: Grossly intact, no focal deficits, moving all 4 extremities. Psychiatric: Normal mood and affect.  Laboratory Data: Lab Results  Component Value Date   WBC 6.4 04/06/2013   HGB 13.5 04/06/2013   HCT 40.5 04/06/2013   MCV 93.3 04/06/2013   PLT 389 04/06/2013    Lab Results  Component Value Date   CREATININE 0.78 04/06/2013    No results found for: PSA  No results found for: TESTOSTERONE  No results found for: HGBA1C  No results found for: TSH      Component Value Date/Time   CHOL 217 (A) 02/14/2010   HDL 61 02/14/2010   LDLCALC 135 02/14/2010    Lab Results  Component Value Date   AST 31 04/06/2013   Lab Results  Component Value Date   ALT 28 04/06/2013   No components found for: ALKALINEPHOPHATASE No components found for: BILIRUBINTOTAL  No results found for: ESTRADIOL  Urinalysis No results found for: COLORURINE, APPEARANCEUR, LABSPEC, PHURINE, GLUCOSEU, HGBUR, BILIRUBINUR, KETONESUR, PROTEINUR, UROBILINOGEN, NITRITE, LEUKOCYTESUR  I have reviewed the labs.   Pertinent Imaging: Results for TSURUKO, MURTHA (MRN [id number]) as of 04/24/2017 12:00  Ref. Range 04/23/2017 15:31  Scan Result Unknown 0     Assessment & Plan:    1. Nighttime Incontinence  - encouraged the patient to be more diligent in using her CPAP machine as sleep apnea contributes to nocturia  - encouraged the patient to work with Dr. Rosario Jacks to get her DM under better control as this contributes to bladder dysfunction  - fluid management - encouraged the patient to limit water intake at night  - offered medical therapy with a beta-3 adrenergic receptor agonist and side effects of therapy - would like to try the beta-3 adrenergic receptor agonist (Myrbetriq).  Given Myrbetriq 25 mg samples, #28.    - RTC in 3 weeks for PVR and OAB questionnaire   2. Nocturia  - advised patient to take the Myrbetriq at night  - see above   Return in about 3 weeks (around 05/14/2017) for PVR and OAB questionnaire.  These notes generated with voice recognition software. I apologize for typographical errors.  Zara Council, [facility] [address] [place], [place] [address] [phone]

## 2017-04-24 ENCOUNTER — Encounter: Payer: Self-pay | Admitting: Urology

## 2017-05-13 NOTE — Progress Notes (Addendum)
05/14/2017 4:13 PM   Madeline Martinez 15-Jun-1945 [id number]  Referring provider: [name], [place],  [address]  No chief complaint on file.   HPI: Patient is a 72 year old African-American female with nighttime incontinence and nocturia who presents today for a 3-week follow-up after switching her Myrbetriq 25 mg daily to night time dosing.    Background history Patient is a 60 -year-old African-American female who is referred to [place] by Dr.[name] for urinary incontinence with her daughter, Madeline Martinez.  She is not a clear historian.  She has difficulty with the specifics of her condition.  Patient states that she has had urinary incontinence for awhile.  Patient has incontinence at night.  She states she just wakes up wet.  She denies any issues during the day.  She is experiencing a strong urgency and nocturia x 2.  Her incontinence volume is moderate to large.   She states that she has not had any night time incontinence for the last two weeks.  Her recent HbgA1c was found to be 10% which is up from 7% three months ago.  She has had some recent changes with her DM medications.  She was told by her gynecologist that she had an UTI, but she denied any symptoms of dysuria, fevers, gross hematuria.  Patient denies any gross hematuria, dysuria or suprapubic/flank pain.  Patient denies any fevers, chills, nausea or vomiting.  She is post menopausal.  She denies constipation and/or diarrhea.   She has not had any recent imaging studies.  She is drinking a lot of water daily into the night.  She is drinking two caffeinated beverages daily.  She is not drinking alcoholic beverages daily.  She has sleep apnea, but she does admit that she sometimes does not sleep with her CPAP machine.   Her PVR is 0 mL.    At her visit on April 23, 2017, she was started on Myrbetriq 25 mg daily and advised to take it at night, to be more diligent in using her CPAP machine and to get tighter  control with her DM.    Today, she is experiencing urgency x 4-7, frequency x 4-7, is restricting fluids to avoid visits to the restroom, is engaging in toilet mapping, incontinence x 4-7 and nocturia x 0-3.  Her PVR is 0 mL. Her BP is 159/78.  Patient denies any gross hematuria, dysuria or suprapubic/flank pain.  Patient denies any fevers, chills, nausea or vomiting.   She feels like she has had some benefit with Myrbetriq 25 mg daily, but she would like to have greater control.  PMH: Past Medical History:  Diagnosis Date  . Acid reflux   . Anxiety   . Depression   . Diabetes mellitus   . Gout   . HTN (hypertension)   . Hyperlipidemia   . Sleep apnea     Surgical History: Past Surgical History:  Procedure Laterality Date  . ABDOMINAL HYSTERECTOMY    . BACK SURGERY  2007   DISCS  . 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 PARTIAL HYSTERECTOMY  1986    Home Medications:  Allergies as of 05/14/2017   No Known Allergies     Medication List        Accurate as of 05/14/17  4:13 PM. Always use your most recent med list.          albuterol 108 (90 Base) MCG/ACT inhaler Commonly known as:  PROVENTIL HFA;VENTOLIN HFA Inhale  2 puffs into the lungs every 6 (six) hours as needed for wheezing or shortness of breath.   allopurinol 100 MG tablet Commonly known as:  ZYLOPRIM allopurinol 100 mg tablet   atorvastatin 80 MG tablet Commonly known as:  LIPITOR Take 80 mg by mouth daily.   buPROPion 150 MG 24 hr tablet Commonly known as:  WELLBUTRIN XL Take 300 mg by mouth daily.   carvedilol 25 MG tablet Commonly known as:  COREG Take 25 mg by mouth 2 (two) times daily.   cetirizine 10 MG tablet Commonly known as:  ZYRTEC   colchicine 0.6 MG tablet Take 0.6 mg by mouth 2 (two) times daily as needed (gout).   diltiazem 180 MG 24 hr capsule Commonly known as:  DILACOR XR Take 180 mg by mouth daily.   DULCOLAX PO Take 1 tablet by mouth daily as needed  (constipation).   fluticasone 50 MCG/ACT nasal spray Commonly known as:  FLONASE Place 2 sprays into the nose as needed for allergies.   glyBURIDE 5 MG tablet Commonly known as:  DIABETA Take 10 mg by mouth 2 (two) times daily.   HYDROcodone-acetaminophen 5-325 MG tablet Commonly known as:  NORCO/VICODIN Take 1 tablet by mouth every 4 (four) hours as needed.   LINZESS 145 MCG Caps capsule Generic drug:  linaclotide Take 145 mcg by mouth daily.   liraglutide 18 MG/3ML Sopn Commonly known as:  VICTOZA Inject into the skin.   losartan 100 MG tablet Commonly known as:  COZAAR Take 100 mg by mouth daily.   MELATONIN PO Take 1 tablet by mouth at bedtime.   metFORMIN 1000 MG tablet Commonly known as:  GLUCOPHAGE Take 1,000 mg by mouth 2 (two) times daily.   methocarbamol 750 MG tablet Commonly known as:  ROBAXIN methocarbamol 750 mg tablet   mirtazapine 15 MG tablet Commonly known as:  REMERON mirtazapine 15 mg tablet   multivitamin with minerals Tabs tablet Take 1 tablet by mouth daily.   nabumetone 750 MG tablet Commonly known as:  RELAFEN Take 750 mg by mouth daily.   ONGLYZA 5 MG Tabs tablet Generic drug:  saxagliptin HCl Onglyza 5 mg tablet   OVER THE COUNTER MEDICATION Place 1 drop into both eyes daily as needed (dry eyes). Eye drops   PROBIOTIC PO Take 1 tablet by mouth daily.   VITAMIN D PO Take 1 tablet by mouth daily.       Allergies: No Known Allergies  Family History: Family History  Problem Relation Age of Onset  . Heart failure Father   . Breast cancer Mother   . Other Sister        DEGEN.DISC DISEASE  . Kidney cancer Neg Hx   . Kidney disease Neg Hx   . Prostate cancer Neg Hx     Social History:  reports that  has never smoked. she has never used smokeless tobacco. She reports that she does not drink alcohol or use drugs.  ROS: UROLOGY Frequent Urination?: No Hard to postpone urination?: Yes Burning/pain with urination?:  No Get up at night to urinate?: No Leakage of urine?: No Urine stream starts and stops?: No Trouble starting stream?: No Do you have to strain to urinate?: No Blood in urine?: No Urinary tract infection?: No Sexually transmitted disease?: No Injury to kidneys or bladder?: No Painful intercourse?: No Weak stream?: No Currently pregnant?: No Vaginal bleeding?: No Last menstrual period?: n  Gastrointestinal Nausea?: No Vomiting?: No Indigestion/heartburn?: No Diarrhea?: No Constipation?: Yes  Constitutional Fever: No Night sweats?: No Weight loss?: No Fatigue?: Yes  Skin Skin rash/lesions?: No Itching?: No  Eyes Blurred vision?: No Double vision?: No  Ears/Nose/Throat Sore throat?: No Sinus problems?: No  Hematologic/Lymphatic Swollen glands?: No Easy bruising?: No  Cardiovascular Leg swelling?: No Chest pain?: No  Respiratory Cough?: No Shortness of breath?: No  Endocrine Excessive thirst?: No  Musculoskeletal Back pain?: No Joint pain?: No  Neurological Headaches?: No Dizziness?: No  Psychologic Depression?: No Anxiety?: No  Physical Exam: BP (!) 159/78   Pulse 99   Ht 5' (1.524 m)   Wt 188 lb 3.2 oz (85.4 kg)   BMI 36.76 kg/m   Constitutional: Well nourished. Alert and oriented, No acute distress. HEENT: [place] AT, moist mucus membranes. Trachea midline, no masses. Cardiovascular: No clubbing, cyanosis, or edema. Respiratory: Normal respiratory effort, no increased work of breathing. Skin: No rashes, bruises or suspicious lesions. Lymph: No cervical or inguinal adenopathy. Neurologic: Grossly intact, no focal deficits, moving all 4 extremities. Psychiatric: Normal mood and affect.  Laboratory Data: Lab Results  Component Value Date   WBC 6.4 04/06/2013   HGB 13.5 04/06/2013   HCT 40.5 04/06/2013   MCV 93.3 04/06/2013   PLT 389 04/06/2013    Lab Results  Component Value Date   CREATININE 0.78 04/06/2013    No results  found for: PSA  No results found for: TESTOSTERONE  No results found for: HGBA1C  No results found for: TSH     Component Value Date/Time   CHOL 217 (A) 02/14/2010   HDL 61 02/14/2010   LDLCALC 135 02/14/2010    Lab Results  Component Value Date   AST 31 04/06/2013   Lab Results  Component Value Date   ALT 28 04/06/2013   No components found for: ALKALINEPHOPHATASE No components found for: BILIRUBINTOTAL  No results found for: ESTRADIOL  Urinalysis No results found for: COLORURINE, APPEARANCEUR, LABSPEC, PHURINE, GLUCOSEU, HGBUR, BILIRUBINUR, KETONESUR, PROTEINUR, UROBILINOGEN, NITRITE, LEUKOCYTESUR  I have reviewed the labs.   Pertinent Imaging: Results for ILYANNA, BAILLARGEON (MRN [id number]) as of 06/05/2017 21:15  Ref. Range 05/14/2017 15:43  Scan Result Unknown 0      Assessment & Plan:    1. Nighttime Incontinence  - encouraged the patient to be continue to be more diligent in using her CPAP machine as sleep apnea contributes to nocturia  - encouraged the patient to continue to work with Dr. Rosario Jacks to get her DM under better control as this contributes to bladder dysfunction  - fluid management - encouraged the patient limiting water at night  - increased Myrbetriq to 50 mg, #28 samples given  - RTC in 3 weeks for PVR and OAB questionnaire   2. Nocturia  - advised patient to take the Myrbetriq at night  - see above   Return in about 3 weeks (around 06/04/2017) for PVR and OAB questionnaire.  These notes generated with voice recognition software. I apologize for typographical errors.  Zara Council, [facility] [address], [place], [place] [address] [phone]

## 2017-05-14 ENCOUNTER — Ambulatory Visit: Payer: Medicare Other | Admitting: Urology

## 2017-05-14 VITALS — BP 159/78 | HR 99 | Ht 60.0 in | Wt 188.2 lb

## 2017-05-14 DIAGNOSIS — N3944 Nocturnal enuresis: Secondary | ICD-10-CM

## 2017-05-14 DIAGNOSIS — R351 Nocturia: Secondary | ICD-10-CM | POA: Diagnosis not present

## 2017-05-14 LAB — BLADDER SCAN AMB NON-IMAGING: Scan Result: 0

## 2017-06-05 ENCOUNTER — Encounter: Payer: Self-pay | Admitting: Urology

## 2017-06-05 NOTE — Progress Notes (Signed)
06/06/2017 2:14 PM   Madeline Martinez [birth year] [id number]  Referring provider: [name], MD No address on file  Chief Complaint  Patient presents with  . Nocturia    3wk    HPI: Patient is a 72 year old African-American female with nighttime incontinence and nocturia who presents today for a 3-week follow-up after increase of her medication of Myrbetriq from 25 mg to 50 mg and take them at night.  Background history Patient is a 47 -year-old African-American female who is referred to [place] by Dr.[name] for urinary incontinence with her daughter, [name].  She is not a clear historian.  She has difficulty with the specifics of her condition.  Patient states that she has had urinary incontinence for awhile.  Patient has incontinence at night.  She states she just wakes up wet.  She denies any issues during the day.  She is experiencing a strong urgency and nocturia x 2.  Her incontinence volume is moderate to large.   She states that she has not had any night time incontinence for the last two weeks.  Her recent HbgA1c was found to be 10% which is up from 7% three months ago.  She has had some recent changes with her DM medications.  She was told by her gynecologist that she had an UTI, but she denied any symptoms of dysuria, fevers, gross hematuria.  Patient denies any gross hematuria, dysuria or suprapubic/flank pain.  Patient denies any fevers, chills, nausea or vomiting.  She is post menopausal.  She denies constipation and/or diarrhea.   She has not had any recent imaging studies.  She is drinking a lot of water daily into the night.  She is drinking two caffeinated beverages daily.  She is not drinking alcoholic beverages daily.  She has sleep apnea, but she does admit that she sometimes does not sleep with her CPAP machine.   Her PVR is 0 mL.    At her visit on April 23, 2017, she was started on Myrbetriq 25 mg daily and advised to take it at night, to be more diligent in using  her CPAP machine and to get tighter control with her DM.    On 05/14/2017, she was experiencing urgency x 4-7, frequency x 4-7, is restricting fluids to avoid visits to the restroom, is engaging in toilet mapping, incontinence x 4-7 and nocturia x 0-3.  Her PVR was 0 mL. Her BP was 159/78.  Patient denies any gross hematuria, dysuria or suprapubic/flank pain.  Patient denies any fevers, chills, nausea or vomiting.   She feels like she has had some benefit with Myrbetriq 25 mg daily, but she would like to have greater control.  Today (06/05/2017), she has been experiencing urgency x 0-3 (improved), frequency x 4-7  (unchanged), not restricting fluids to avoid visits to the restroom, is engaging in toilet mapping, incontinence x 0-3 (improved) and nocturia x 0-3 (unchanged).   Her PVR is 68 mL.  Her BP is 158/84.  Patient denies any gross hematuria, dysuria or suprapubic/flank pain.  Patient denies any fevers, chills, nausea or vomiting.   She states she has found that she is reaching her goals with the Myrbetriq 50 mg daily.  She is also taking her CPAP machine with her on trips.    PMH: Past Medical History:  Diagnosis Date  . Acid reflux   . Anxiety   . Depression   . Diabetes mellitus   . Gout   . HTN (hypertension)   .  Hyperlipidemia   . Sleep apnea     Surgical History: Past Surgical History:  Procedure Laterality Date  . ABDOMINAL HYSTERECTOMY    . BACK SURGERY  2007   DISCS  . 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 PARTIAL HYSTERECTOMY  1986    Home Medications:  Allergies as of 06/06/2017   No Known Allergies     Medication List        Accurate as of 06/06/17 11:59 PM. Always use your most recent med list.          albuterol 108 (90 Base) MCG/ACT inhaler Commonly known as:  PROVENTIL HFA;VENTOLIN HFA Inhale 2 puffs into the lungs every 6 (six) hours as needed for wheezing or shortness of breath.   allopurinol 100 MG tablet Commonly known as:   ZYLOPRIM allopurinol 100 mg tablet   atorvastatin 80 MG tablet Commonly known as:  LIPITOR Take 80 mg by mouth daily.   buPROPion 150 MG 24 hr tablet Commonly known as:  WELLBUTRIN XL Take 300 mg by mouth daily.   carvedilol 25 MG tablet Commonly known as:  COREG Take 25 mg by mouth 2 (two) times daily.   cetirizine 10 MG tablet Commonly known as:  ZYRTEC   colchicine 0.6 MG tablet Take 0.6 mg by mouth 2 (two) times daily as needed (gout).   diltiazem 180 MG 24 hr capsule Commonly known as:  DILACOR XR Take 180 mg by mouth daily.   DULCOLAX PO Take 1 tablet by mouth daily as needed (constipation).   fluticasone 50 MCG/ACT nasal spray Commonly known as:  FLONASE Place 2 sprays into the nose as needed for allergies.   glyBURIDE 5 MG tablet Commonly known as:  DIABETA Take 10 mg by mouth 2 (two) times daily.   HYDROcodone-acetaminophen 5-325 MG tablet Commonly known as:  NORCO/VICODIN Take 1 tablet by mouth every 4 (four) hours as needed.   LINZESS 145 MCG Caps capsule Generic drug:  linaclotide Take 145 mcg by mouth daily.   liraglutide 18 MG/3ML Sopn Commonly known as:  VICTOZA Inject into the skin.   losartan 100 MG tablet Commonly known as:  COZAAR Take 100 mg by mouth daily.   MELATONIN PO Take 1 tablet by mouth at bedtime.   metFORMIN 1000 MG tablet Commonly known as:  GLUCOPHAGE Take 1,000 mg by mouth 2 (two) times daily.   methocarbamol 750 MG tablet Commonly known as:  ROBAXIN methocarbamol 750 mg tablet   mirabegron ER 50 MG Tb24 tablet Commonly known as:  MYRBETRIQ Take 1 tablet (50 mg total) by mouth daily.   mirtazapine 15 MG tablet Commonly known as:  REMERON mirtazapine 15 mg tablet   multivitamin with minerals Tabs tablet Take 1 tablet by mouth daily.   nabumetone 750 MG tablet Commonly known as:  RELAFEN Take 750 mg by mouth daily.   ONGLYZA 5 MG Tabs tablet Generic drug:  saxagliptin HCl Onglyza 5 mg tablet   OVER THE  COUNTER MEDICATION Place 1 drop into both eyes daily as needed (dry eyes). Eye drops   PROBIOTIC PO Take 1 tablet by mouth daily.   VITAMIN D PO Take 1 tablet by mouth daily.       Allergies: No Known Allergies  Family History: Family History  Problem Relation Age of Onset  . Heart failure Father   . Breast cancer Mother   . Other Sister        DEGEN.DISC DISEASE  . Kidney cancer  Neg Hx   . Kidney disease Neg Hx   . Prostate cancer Neg Hx     Social History:  reports that she has never smoked. She has never used smokeless tobacco. She reports that she does not drink alcohol or use drugs.  ROS: UROLOGY Frequent Urination?: No Hard to postpone urination?: Yes Burning/pain with urination?: No Get up at night to urinate?: No Leakage of urine?: Yes Urine stream starts and stops?: No Trouble starting stream?: No Do you have to strain to urinate?: No Blood in urine?: No Urinary tract infection?: No Sexually transmitted disease?: No Injury to kidneys or bladder?: No Painful intercourse?: No Weak stream?: No Currently pregnant?: No Vaginal bleeding?: No Last menstrual period?: n  Gastrointestinal Nausea?: No Vomiting?: No Indigestion/heartburn?: No Diarrhea?: No Constipation?: No  Constitutional Fever: No Night sweats?: No Weight loss?: No Fatigue?: No  Skin Skin rash/lesions?: No Itching?: No  Eyes Blurred vision?: No Double vision?: No  Ears/Nose/Throat Sore throat?: No Sinus problems?: No  Hematologic/Lymphatic Swollen glands?: No Easy bruising?: No  Cardiovascular Leg swelling?: No Chest pain?: No  Respiratory Cough?: No Shortness of breath?: No  Endocrine Excessive thirst?: No  Musculoskeletal Back pain?: No Joint pain?: No  Neurological Headaches?: No Dizziness?: No  Psychologic Depression?: No Anxiety?: No  Physical Exam: BP (!) 158/84   Pulse 94   Ht 5' (1.524 m)   Wt 180 lb (81.6 kg)   BMI 35.15 kg/m    Constitutional: Well nourished. Alert and oriented, No acute distress. HEENT: [place] AT, moist mucus membranes. Trachea midline, no masses. Cardiovascular: No clubbing, cyanosis, or edema. Respiratory: Normal respiratory effort, no increased work of breathing. Skin: No rashes, bruises or suspicious lesions. Neurologic: Grossly intact, no focal deficits, moving all 4 extremities. Psychiatric: Normal mood and affect.  Laboratory Data: Lab Results  Component Value Date   WBC 6.4 04/06/2013   HGB 13.5 04/06/2013   HCT 40.5 04/06/2013   MCV 93.3 04/06/2013   PLT 389 04/06/2013    Lab Results  Component Value Date   CREATININE 0.78 04/06/2013    No results found for: PSA  No results found for: TESTOSTERONE  No results found for: HGBA1C  No results found for: TSH     Component Value Date/Time   CHOL 217 (A) 02/14/2010   HDL 61 02/14/2010   LDLCALC 135 02/14/2010    Lab Results  Component Value Date   AST 31 04/06/2013   Lab Results  Component Value Date   ALT 28 04/06/2013   No components found for: ALKALINEPHOPHATASE No components found for: BILIRUBINTOTAL  No results found for: ESTRADIOL  Urinalysis No results found for: COLORURINE, APPEARANCEUR, LABSPEC, PHURINE, GLUCOSEU, HGBUR, BILIRUBINUR, KETONESUR, PROTEINUR, UROBILINOGEN, NITRITE, LEUKOCYTESUR  I have reviewed the labs.   Pertinent Imaging: Results for JAMESIA, LINNEN (MRN [id number]) as of 06/14/2017 14:16  Ref. Range 06/06/2017 16:02  Scan Result Unknown 39ml   Assessment & Plan:    1. Nighttime Incontinence  - continue to be more diligent in using her CPAP machine as sleep apnea contributes to nocturia  - continue to work with Dr. Rosario Jacks to get her DM under better control as this contributes to bladder dysfunction  - fluid management - encouraged the patient limiting water at night  - continue Myrbetriq to 50 mg; prescription given  - RTC in 3 months for PVR and OAB questionnaire   2.  Nocturia  - advised patient to take the Myrbetriq at night  - see above  Return in about 3 months (around 09/05/2017) for PVR and OAB questionnaire.  These notes generated with voice recognition software. I apologize for typographical errors.  Zara Council, PA-C  [facility] [address] [place], [place] [address] [phone]

## 2017-06-06 ENCOUNTER — Encounter: Payer: Self-pay | Admitting: Urology

## 2017-06-06 ENCOUNTER — Ambulatory Visit (INDEPENDENT_AMBULATORY_CARE_PROVIDER_SITE_OTHER): Payer: Medicare Other | Admitting: Urology

## 2017-06-06 VITALS — BP 158/84 | HR 94 | Ht 60.0 in | Wt 180.0 lb

## 2017-06-06 DIAGNOSIS — R351 Nocturia: Secondary | ICD-10-CM

## 2017-06-06 DIAGNOSIS — N3944 Nocturnal enuresis: Secondary | ICD-10-CM | POA: Diagnosis not present

## 2017-06-06 LAB — BLADDER SCAN AMB NON-IMAGING

## 2017-06-06 MED ORDER — MIRABEGRON ER 50 MG PO TB24: 50.0000 mg | ORAL_TABLET | Freq: Every day | ORAL | 3 refills | Status: DC

## 2017-09-18 NOTE — Progress Notes (Signed)
09/19/2017 4:20 PM   Madeline Martinez 1945/09/29 [id number]  Referring provider: [name], MD No address on file  Chief Complaint  Patient presents with  . Nocturia    3 month    HPI: Patient is a 72 year old African-American female with nighttime incontinence and nocturia who presents today for a 36-month follow-up after increase of her medication of Myrbetriq from 25 mg to 50 mg and take them at night.  Background history Patient is a 61 -year-old African-American female who is referred to [place] by Dr.[name] for urinary incontinence with her daughter, [name].  She is not a clear historian.  She has difficulty with the specifics of her condition.  Patient states that she has had urinary incontinence for awhile.  Patient has incontinence at night.  She states she just wakes up wet.  She denies any issues during the day.  She is experiencing a strong urgency and nocturia x 2.  Her incontinence volume is moderate to large.   She states that she has not had any night time incontinence for the last two weeks.  Her recent HbgA1c was found to be 10% which is up from 7% three months ago.  She has had some recent changes with her DM medications.  She was told by her gynecologist that she had an UTI, but she denied any symptoms of dysuria, fevers, gross hematuria.  Patient denies any gross hematuria, dysuria or suprapubic/flank pain.  Patient denies any fevers, chills, nausea or vomiting.  She is post menopausal.  She denies constipation and/or diarrhea.   She has not had any recent imaging studies.  She is drinking a lot of water daily into the night.  She is drinking two caffeinated beverages daily.  She is not drinking alcoholic beverages daily.  She has sleep apnea, but she does admit that she sometimes does not sleep with her CPAP machine.   Her PVR is 0 mL.    At her visit on April 23, 2017, she was started on Myrbetriq 25 mg daily and advised to take it at night, to be more diligent in  using her CPAP machine and to get tighter control with her DM.    On 05/14/2017, she was experiencing urgency x 4-7, frequency x 4-7, is restricting fluids to avoid visits to the restroom, is engaging in toilet mapping, incontinence x 4-7 and nocturia x 0-3.  Her PVR was 0 mL. Her BP was 159/78.  Patient denies any gross hematuria, dysuria or suprapubic/flank pain.  Patient denies any fevers, chills, nausea or vomiting.   She feels like she has had some benefit with Myrbetriq 25 mg daily, but she would like to have greater control.  (06/05/2017), she has been experiencing urgency x 0-3 (improved), frequency x 4-7  (unchanged), not restricting fluids to avoid visits to the restroom, is engaging in toilet mapping, incontinence x 0-3 (improved) and nocturia x 0-3 (unchanged).   Her PVR is 68 mL.  Her BP is 158/84.  Patient denies any gross hematuria, dysuria or suprapubic/flank pain.  Patient denies any fevers, chills, nausea or vomiting.   She states she has found that she is reaching her goals with the Myrbetriq 50 mg daily.  She is also taking her CPAP machine with her on trips.    The patient has been experiencing urgency x 0-3 (unchange), frequency x 4-7 (unchanged), not restricting fluids to avoid visits to the restroom, is engaging in toilet mapping, incontinence x 4-7 (worse) and nocturia x 0-3 (unchanged).  Patient denies any gross hematuria, dysuria or suprapubic/flank pain.  Patient denies any fevers, chills, nausea or vomiting.  PVR is 79 mL.  BP is 131/72.    She had two episodes of incontinence during a vacation where she was eating and drinking late into the evenings.  She is on Myrbetriq 50 mg daily.    PMH: Past Medical History:  Diagnosis Date  . Acid reflux   . Anxiety   . Depression   . Diabetes mellitus   . Gout   . HTN (hypertension)   . Hyperlipidemia   . Sleep apnea     Surgical History: Past Surgical History:  Procedure Laterality Date  . ABDOMINAL HYSTERECTOMY    .  BACK SURGERY  2007   DISCS  . 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 PARTIAL HYSTERECTOMY  1986    Home Medications:  Allergies as of 09/19/2017   No Known Allergies     Medication List        Accurate as of 09/19/17  4:20 PM. Always use your most recent med list.          albuterol 108 (90 Base) MCG/ACT inhaler Commonly known as:  PROVENTIL HFA;VENTOLIN HFA Inhale 2 puffs into the lungs every 6 (six) hours as needed for wheezing or shortness of breath.   allopurinol 100 MG tablet Commonly known as:  ZYLOPRIM allopurinol 100 mg tablet   atorvastatin 80 MG tablet Commonly known as:  LIPITOR Take 80 mg by mouth daily.   buPROPion 150 MG 24 hr tablet Commonly known as:  WELLBUTRIN XL Take 300 mg by mouth daily.   carvedilol 25 MG tablet Commonly known as:  COREG Take 25 mg by mouth 2 (two) times daily.   cetirizine 10 MG tablet Commonly known as:  ZYRTEC   colchicine 0.6 MG tablet Take 0.6 mg by mouth 2 (two) times daily as needed (gout).   diltiazem 180 MG 24 hr capsule Commonly known as:  DILACOR XR Take 180 mg by mouth daily.   DULCOLAX PO Take 1 tablet by mouth daily as needed (constipation).   fluticasone 50 MCG/ACT nasal spray Commonly known as:  FLONASE Place 2 sprays into the nose as needed for allergies.   glyBURIDE 5 MG tablet Commonly known as:  DIABETA Take 10 mg by mouth 2 (two) times daily.   HYDROcodone-acetaminophen 5-325 MG tablet Commonly known as:  NORCO/VICODIN Take 1 tablet by mouth every 4 (four) hours as needed.   LINZESS 145 MCG Caps capsule Generic drug:  linaclotide Take 145 mcg by mouth daily.   liraglutide 18 MG/3ML Sopn Commonly known as:  VICTOZA Inject into the skin.   losartan 100 MG tablet Commonly known as:  COZAAR Take 100 mg by mouth daily.   MELATONIN PO Take 1 tablet by mouth at bedtime.   metFORMIN 1000 MG tablet Commonly known as:  GLUCOPHAGE Take 1,000 mg by mouth 2 (two) times daily.     methocarbamol 750 MG tablet Commonly known as:  ROBAXIN methocarbamol 750 mg tablet   mirabegron ER 50 MG Tb24 tablet Commonly known as:  MYRBETRIQ Take 1 tablet (50 mg total) by mouth daily.   mirtazapine 15 MG tablet Commonly known as:  REMERON mirtazapine 15 mg tablet   multivitamin with minerals Tabs tablet Take 1 tablet by mouth daily.   nabumetone 750 MG tablet Commonly known as:  RELAFEN Take 750 mg by mouth daily.   ONGLYZA 5 MG Tabs tablet Generic  drug:  saxagliptin HCl Onglyza 5 mg tablet   OVER THE COUNTER MEDICATION Place 1 drop into both eyes daily as needed (dry eyes). Eye drops   PROBIOTIC PO Take 1 tablet by mouth daily.   TOUJEO SOLOSTAR 300 UNIT/ML Sopn Generic drug:  Insulin Glargine   VITAMIN D PO Take 1 tablet by mouth daily.       Allergies: No Known Allergies  Family History: Family History  Problem Relation Age of Onset  . Heart failure Father   . Breast cancer Mother   . Other Sister        DEGEN.DISC DISEASE  . Kidney cancer Neg Hx   . Kidney disease Neg Hx   . Prostate cancer Neg Hx     Social History:  reports that she has never smoked. She has never used smokeless tobacco. She reports that she does not drink alcohol or use drugs.  ROS: UROLOGY Frequent Urination?: No Hard to postpone urination?: No Burning/pain with urination?: No Get up at night to urinate?: No Leakage of urine?: No Urine stream starts and stops?: No Trouble starting stream?: No Do you have to strain to urinate?: No Blood in urine?: No Urinary tract infection?: No Sexually transmitted disease?: No Injury to kidneys or bladder?: No Painful intercourse?: No Weak stream?: No Currently pregnant?: No Vaginal bleeding?: No Last menstrual period?: n  Gastrointestinal Nausea?: No Vomiting?: No Indigestion/heartburn?: No Diarrhea?: No Constipation?: No  Constitutional Fever: No Night sweats?: No Weight loss?: No Fatigue?: No  Skin Skin  rash/lesions?: No Itching?: No  Eyes Blurred vision?: No Double vision?: No  Ears/Nose/Throat Sore throat?: No Sinus problems?: No  Hematologic/Lymphatic Swollen glands?: No Easy bruising?: No  Cardiovascular Leg swelling?: No Chest pain?: No  Respiratory Cough?: No Shortness of breath?: No  Endocrine Excessive thirst?: No  Musculoskeletal Back pain?: No Joint pain?: No  Neurological Headaches?: No Dizziness?: No  Psychologic Depression?: No Anxiety?: No  Physical Exam: BP 131/72   Pulse 85   Ht 5\' 1"  (1.549 m)   Wt 185 lb (83.9 kg)   BMI 34.96 kg/m   Constitutional: Well nourished. Alert and oriented, No acute distress. HEENT: [place] AT, moist mucus membranes. Trachea midline, no masses. Cardiovascular: No clubbing, cyanosis, or edema. Respiratory: Normal respiratory effort, no increased work of breathing. Skin: No rashes, bruises or suspicious lesions. Lymph: No cervical or inguinal adenopathy. Neurologic: Grossly intact, no focal deficits, moving all 4 extremities. Psychiatric: Normal mood and affect.  Laboratory Data: Lab Results  Component Value Date   WBC 6.4 04/06/2013   HGB 13.5 04/06/2013   HCT 40.5 04/06/2013   MCV 93.3 04/06/2013   PLT 389 04/06/2013    Lab Results  Component Value Date   CREATININE 0.78 04/06/2013    No results found for: PSA  No results found for: TESTOSTERONE  No results found for: HGBA1C  No results found for: TSH     Component Value Date/Time   CHOL 217 (A) 02/14/2010   HDL 61 02/14/2010   LDLCALC 135 02/14/2010    Lab Results  Component Value Date   AST 31 04/06/2013   Lab Results  Component Value Date   ALT 28 04/06/2013   No components found for: ALKALINEPHOPHATASE No components found for: BILIRUBINTOTAL  No results found for: ESTRADIOL  Urinalysis No results found for: COLORURINE, APPEARANCEUR, LABSPEC, PHURINE, GLUCOSEU, HGBUR, BILIRUBINUR, KETONESUR, PROTEINUR, UROBILINOGEN, NITRITE,  LEUKOCYTESUR  I have reviewed the labs.   Pertinent Imaging: Results for ANNAMARIA, SALAH (MRN [id number])  as of 09/19/2017 16:17  Ref. Range 09/19/2017 15:54  Scan Result Unknown 90ml    Assessment & Plan:    1. Nighttime Incontinence Continue to be more diligent in using her CPAP machine as sleep apnea contributes to nocturia Continue to work with Dr. Rosario Jacks to get her DM under better control as this contributes to bladder dysfunction Fluid management - encouraged the patient limiting water at night Continue Myrbetriq to 50 mg; prescription given RTC in one year for PVR and OAB questionnaire   2. Nocturia Explained to the patient how diet, DM and sleep apnea play a part in the night time episodes Encouraged her to engage in the 80/20 rule where she is living a good life style (eating healthy and exercises 80% of the time)  Return in about 1 year (around 09/20/2018) for PVR and OAB questionnaire.  These notes generated with voice recognition software. I apologize for typographical errors.  Zara Council, PA-C  [facility] [address] [place], [place] [address] [phone]

## 2017-09-19 ENCOUNTER — Encounter: Payer: Self-pay | Admitting: Urology

## 2017-09-19 ENCOUNTER — Ambulatory Visit (INDEPENDENT_AMBULATORY_CARE_PROVIDER_SITE_OTHER): Payer: Medicare Other | Admitting: Urology

## 2017-09-19 VITALS — BP 131/72 | HR 85 | Ht 61.0 in | Wt 185.0 lb

## 2017-09-19 DIAGNOSIS — N3944 Nocturnal enuresis: Secondary | ICD-10-CM

## 2017-09-19 DIAGNOSIS — R351 Nocturia: Secondary | ICD-10-CM

## 2017-09-19 LAB — BLADDER SCAN AMB NON-IMAGING

## 2017-11-15 ENCOUNTER — Encounter: Payer: Self-pay | Admitting: Internal Medicine

## 2017-11-27 ENCOUNTER — Other Ambulatory Visit: Payer: Self-pay

## 2017-11-27 DIAGNOSIS — Z1211 Encounter for screening for malignant neoplasm of colon: Secondary | ICD-10-CM

## 2017-12-19 ENCOUNTER — Ambulatory Visit: Payer: Medicare Other | Admitting: Anesthesiology

## 2017-12-19 ENCOUNTER — Ambulatory Visit
Admission: RE | Admit: 2017-12-19 | Discharge: 2017-12-19 | Disposition: A | Discharge status: Home / Self Care | Payer: Medicare Other | Source: Ambulatory Visit | Attending: Gastroenterology | Admitting: Gastroenterology

## 2017-12-19 ENCOUNTER — Encounter: Payer: Self-pay | Admitting: Anesthesiology

## 2017-12-19 ENCOUNTER — Encounter
Admission: RE | Disposition: A | Discharge status: Home / Self Care | Payer: Self-pay | Source: Ambulatory Visit | Attending: Gastroenterology

## 2017-12-19 DIAGNOSIS — Z79899 Other long term (current) drug therapy: Secondary | ICD-10-CM | POA: Diagnosis not present

## 2017-12-19 DIAGNOSIS — Z947 Corneal transplant status: Secondary | ICD-10-CM | POA: Insufficient documentation

## 2017-12-19 DIAGNOSIS — Z8249 Family history of ischemic heart disease and other diseases of the circulatory system: Secondary | ICD-10-CM | POA: Diagnosis not present

## 2017-12-19 DIAGNOSIS — K635 Polyp of colon: Secondary | ICD-10-CM | POA: Diagnosis not present

## 2017-12-19 DIAGNOSIS — F329 Major depressive disorder, single episode, unspecified: Secondary | ICD-10-CM | POA: Insufficient documentation

## 2017-12-19 DIAGNOSIS — M109 Gout, unspecified: Secondary | ICD-10-CM | POA: Diagnosis not present

## 2017-12-19 DIAGNOSIS — G4730 Sleep apnea, unspecified: Secondary | ICD-10-CM | POA: Diagnosis not present

## 2017-12-19 DIAGNOSIS — D125 Benign neoplasm of sigmoid colon: Secondary | ICD-10-CM

## 2017-12-19 DIAGNOSIS — F419 Anxiety disorder, unspecified: Secondary | ICD-10-CM | POA: Insufficient documentation

## 2017-12-19 DIAGNOSIS — Z1211 Encounter for screening for malignant neoplasm of colon: Secondary | ICD-10-CM

## 2017-12-19 DIAGNOSIS — Z7984 Long term (current) use of oral hypoglycemic drugs: Secondary | ICD-10-CM | POA: Insufficient documentation

## 2017-12-19 DIAGNOSIS — D122 Benign neoplasm of ascending colon: Secondary | ICD-10-CM | POA: Insufficient documentation

## 2017-12-19 DIAGNOSIS — K640 First degree hemorrhoids: Secondary | ICD-10-CM | POA: Insufficient documentation

## 2017-12-19 DIAGNOSIS — E119 Type 2 diabetes mellitus without complications: Secondary | ICD-10-CM | POA: Insufficient documentation

## 2017-12-19 DIAGNOSIS — Z6836 Body mass index (BMI) 36.0-36.9, adult: Secondary | ICD-10-CM | POA: Diagnosis not present

## 2017-12-19 DIAGNOSIS — I10 Essential (primary) hypertension: Secondary | ICD-10-CM | POA: Diagnosis not present

## 2017-12-19 DIAGNOSIS — E669 Obesity, unspecified: Secondary | ICD-10-CM | POA: Insufficient documentation

## 2017-12-19 DIAGNOSIS — E785 Hyperlipidemia, unspecified: Secondary | ICD-10-CM | POA: Diagnosis not present

## 2017-12-19 DIAGNOSIS — K219 Gastro-esophageal reflux disease without esophagitis: Secondary | ICD-10-CM | POA: Diagnosis not present

## 2017-12-19 HISTORY — PX: COLONOSCOPY WITH PROPOFOL: SHX5780

## 2017-12-19 SURGERY — COLONOSCOPY WITH PROPOFOL
Anesthesia: General

## 2017-12-19 MED ORDER — PROPOFOL 500 MG/50ML IV EMUL
INTRAVENOUS | Status: DC | PRN
  Administered 2017-12-19: 150 ug/kg/min via INTRAVENOUS

## 2017-12-19 MED ORDER — SODIUM CHLORIDE 0.9 % IV SOLN
INTRAVENOUS | Status: DC
  Administered 2017-12-19: 1000 mL via INTRAVENOUS

## 2017-12-19 MED ORDER — LIDOCAINE HCL (CARDIAC) PF 100 MG/5ML IV SOSY
PREFILLED_SYRINGE | INTRAVENOUS | Status: DC | PRN
  Administered 2017-12-19: 50 mg via INTRAVENOUS

## 2017-12-19 MED ORDER — PROPOFOL 10 MG/ML IV BOLUS
INTRAVENOUS | Status: AC
  Filled 2017-12-19: qty 20

## 2017-12-19 MED ORDER — PROPOFOL 10 MG/ML IV BOLUS
INTRAVENOUS | Status: DC | PRN
  Administered 2017-12-19: 80 mg via INTRAVENOUS

## 2017-12-19 NOTE — Op Note (Signed)
[facility] Gastroenterology Patient Name: Madeline Martinez Procedure Date: 12/19/2017 8:09 AM MRN: [id number] Account #: [id number] Date of Birth: [birth year] Admit Type: Outpatient Age: 72 Room: [facility] ENDO ROOM 4 Gender: Female Note Status: Finalized Procedure:            Colonoscopy Indications:          Screening for colorectal malignant neoplasm Providers:            Jonathon Bellows MD, MD Referring MD:         [name], MD (Referring MD) Medicines:            Monitored Anesthesia Care Complications:        No immediate complications. Procedure:            Pre-Anesthesia Assessment:                       - Prior to the procedure, a History and Physical was                        performed, and patient medications, allergies and                        sensitivities were reviewed. The patient's tolerance of                        previous anesthesia was reviewed.                       - The risks and benefits of the procedure and the                        sedation options and risks were discussed with the                        patient. All questions were answered and informed                        consent was obtained.                       - ASA Grade Assessment: III - A patient with severe                        systemic disease.                       After obtaining informed consent, the colonoscope was                        passed under direct vision. Throughout the procedure,                        the patient's blood pressure, pulse, and oxygen                        saturations were monitored continuously. The                        Colonoscope was introduced through the anus and  advanced to the the cecum, identified by the                        appendiceal orifice, IC valve and transillumination.                        The colonoscopy was performed with ease. The patient                        tolerated the procedure well. The  quality of the bowel                        preparation was good. Findings:      The perianal and digital rectal examinations were normal.      A 5 mm polyp was found in the sigmoid colon. The polyp was sessile. The       polyp was removed with a cold snare. Resection and retrieval were       complete.      A 7 mm polyp was found in the ascending colon. The polyp was sessile.       The polyp was removed with a cold snare. Resection and retrieval were       complete.      Non-bleeding internal hemorrhoids were found during retroflexion. The       hemorrhoids were medium-sized and Grade I (internal hemorrhoids that do       not prolapse).      The exam was otherwise without abnormality on direct and retroflexion       views. Impression:           - One 5 mm polyp in the sigmoid colon, removed with a                        cold snare. Resected and retrieved.                       - One 7 mm polyp in the ascending colon, removed with a                        cold snare. Resected and retrieved.                       - Non-bleeding internal hemorrhoids.                       - The examination was otherwise normal on direct and                        retroflexion views. Recommendation:       - Discharge patient to home (with escort).                       - Resume previous diet.                       - Continue present medications.                       - Await pathology results.                       - Repeat colonoscopy  in 5 years for surveillance. Procedure Code(s):    --- Professional ---                       [phone], Colonoscopy, flexible; with removal of tumor(s),                        polyp(s), or other lesion(s) by snare technique Diagnosis Code(s):    --- Professional ---                       Z12.11, Encounter for screening for malignant neoplasm                        of colon                       D12.5, Benign neoplasm of sigmoid colon                       D12.2, Benign  neoplasm of ascending colon                       K64.0, First degree hemorrhoids CPT copyright 2018 [employer]. All rights reserved. The codes documented in this report are preliminary and upon coder review may  be revised to meet current compliance requirements. Jonathon Bellows, MD Jonathon Bellows MD, MD 12/19/2017 8:41:14 AM This report has been signed electronically. Number of Addenda: 0 Note Initiated On: 12/19/2017 8:09 AM Scope Withdrawal Time: 0 hours 13 minutes 23 seconds  Total Procedure Duration: 0 hours 19 minutes 32 seconds       [facility]

## 2017-12-19 NOTE — Transfer of Care (Signed)
Immediate Anesthesia Transfer of Care Note  Patient: Elley Harp  Procedure(s) Performed: COLONOSCOPY WITH PROPOFOL (N/A )  Patient Location: PACU  Anesthesia Type:General  Level of Consciousness: sedated  Airway & Oxygen Therapy: Patient Spontanous Breathing and Patient connected to nasal cannula oxygen  Post-op Assessment: Report given to RN and Post -op Vital signs reviewed and stable  Post vital signs: Reviewed and stable  Last Vitals:  Vitals Value Taken Time  BP 107/54 12/19/2017  8:43 AM  Temp 36.2 C 12/19/2017  8:43 AM  Pulse 82 12/19/2017  8:43 AM  Resp 18 12/19/2017  8:43 AM  SpO2 99 % 12/19/2017  8:43 AM  Vitals shown include unvalidated device data.  Last Pain:  Vitals:   12/19/17 0843  TempSrc: Tympanic  PainSc: 0-No pain         Complications: No apparent anesthesia complications

## 2017-12-19 NOTE — Anesthesia Post-op Follow-up Note (Signed)
Anesthesia QCDR form completed.        

## 2017-12-19 NOTE — Anesthesia Procedure Notes (Signed)
Date/Time: 12/19/2017 8:05 AM Performed by: Johnna Acosta, CRNA Pre-anesthesia Checklist: Patient identified, Emergency Drugs available, Suction available, Patient being monitored and Timeout performed Patient Re-evaluated:Patient Re-evaluated prior to induction Oxygen Delivery Method: Nasal cannula Preoxygenation: Pre-oxygenation with 100% oxygen Induction Type: IV induction

## 2017-12-19 NOTE — Anesthesia Postprocedure Evaluation (Signed)
Anesthesia Post Note  Patient: Madeline Martinez  Procedure(s) Performed: COLONOSCOPY WITH PROPOFOL (N/A )  Patient location during evaluation: Endoscopy Anesthesia Type: General Level of consciousness: awake and alert Pain management: pain level controlled Vital Signs Assessment: post-procedure vital signs reviewed and stable Respiratory status: spontaneous breathing, nonlabored ventilation, respiratory function stable and patient connected to nasal cannula oxygen Cardiovascular status: blood pressure returned to baseline and stable Postop Assessment: no apparent nausea or vomiting Anesthetic complications: no     Last Vitals:  Vitals:   12/19/17 0903 12/19/17 0913  BP: (!) 142/76 (!) 149/76  Pulse: 78 77  Resp: (!) 22 15  Temp:    SpO2: 100% 100%    Last Pain:  Vitals:   12/19/17 0913  TempSrc:   PainSc: 0-No pain                 Taber Sweetser S

## 2017-12-19 NOTE — Anesthesia Preprocedure Evaluation (Addendum)
Anesthesia Evaluation  Patient identified by MRN, date of birth, ID band Patient awake    Reviewed: Allergy & Precautions, NPO status , Patient's Chart, lab work & pertinent test results, reviewed documented beta blocker date and time   Airway Mallampati: III  TM Distance: >3 FB     Dental  (+) Upper Dentures, Lower Dentures   Pulmonary sleep apnea ,           Cardiovascular hypertension, Pt. on medications and Pt. on home beta blockers      Neuro/Psych PSYCHIATRIC DISORDERS Anxiety Depression    GI/Hepatic GERD  ,  Endo/Other  diabetes, Type 2  Renal/GU      Musculoskeletal   Abdominal   Peds  Hematology   Anesthesia Other Findings Gout. Obese.  Reproductive/Obstetrics                            Anesthesia Physical Anesthesia Plan  ASA: III  Anesthesia Plan: General   Post-op Pain Management:    Induction: Intravenous  PONV Risk Score and Plan:   Airway Management Planned:   Additional Equipment:   Intra-op Plan:   Post-operative Plan:   Informed Consent: I have reviewed the patients History and Physical, chart, labs and discussed the procedure including the risks, benefits and alternatives for the proposed anesthesia with the patient or authorized representative who has indicated his/her understanding and acceptance.     Plan Discussed with: CRNA  Anesthesia Plan Comments:         Anesthesia Quick Evaluation

## 2017-12-19 NOTE — H&P (Signed)
Madeline Bellows, MD [address] [place], [place], [place], [address] [address], [place], [place], [place], [address] Phone: [phone]  Fax: [phone]  Primary Care Physician:  [name], MD   Pre-Procedure History & Physical: HPI:  Madeline Martinez is a 72 y.o. female is here for an colonoscopy.   Past Medical History:  Diagnosis Date  . Acid reflux   . Anxiety   . Depression   . Diabetes mellitus   . Gout   . HTN (hypertension)   . Hyperlipidemia   . Sleep apnea     Past Surgical History:  Procedure Laterality Date  . ABDOMINAL HYSTERECTOMY    . BACK SURGERY  2007   DISCS  . 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 PARTIAL HYSTERECTOMY  1986    Prior to Admission medications   Medication Sig Start Date End Date Taking? Authorizing Provider  albuterol (PROVENTIL HFA;VENTOLIN HFA) 108 (90 BASE) MCG/ACT inhaler Inhale 2 puffs into the lungs every 6 (six) hours as needed for wheezing or shortness of breath.   Yes [provider]  allopurinol (ZYLOPRIM) 100 MG tablet allopurinol 100 mg tablet 04/30/16  Yes [provider]  atorvastatin (LIPITOR) 80 MG tablet Take 80 mg by mouth daily.     Yes [provider]  Bisacodyl (DULCOLAX PO) Take 1 tablet by mouth daily as needed (constipation).    Yes [provider]  buPROPion (WELLBUTRIN XL) 150 MG 24 hr tablet Take 300 mg by mouth daily.    Yes [provider]  carvedilol (COREG) 25 MG tablet Take 25 mg by mouth 2 (two) times daily.     Yes [provider]  cetirizine (ZYRTEC) 10 MG tablet  04/20/17  Yes [provider]  Cholecalciferol (VITAMIN D PO) Take 1 tablet by mouth daily.   Yes [provider]  colchicine 0.6 MG tablet Take 0.6 mg by mouth 2 (two) times daily as needed (gout).    Yes [provider]  diltiazem (DILACOR XR) 180 MG 24 hr capsule Take 180 mg by mouth daily.     Yes [provider]    fluticasone (FLONASE) 50 MCG/ACT nasal spray Place 2 sprays into the nose as needed for allergies.    Yes [provider]  glyBURIDE (DIABETA) 5 MG tablet Take 10 mg by mouth 2 (two) times daily.     Yes [provider]  HYDROcodone-acetaminophen (NORCO/VICODIN) 5-325 MG per tablet Take 1 tablet by mouth every 4 (four) hours as needed. 04/06/13  Yes Deno Etienne, DO  LINZESS 145 MCG CAPS capsule Take 145 mcg by mouth daily. 02/21/17  Yes [provider]  losartan (COZAAR) 100 MG tablet Take 100 mg by mouth daily.   02/14/10  Yes [provider]  MELATONIN PO Take 1 tablet by mouth at bedtime.   Yes [provider]  metFORMIN (GLUCOPHAGE) 1000 MG tablet Take 1,000 mg by mouth 2 (two) times daily.     Yes [provider]  methocarbamol (ROBAXIN) 750 MG tablet methocarbamol 750 mg tablet 05/10/16  Yes [provider]  mirabegron ER (MYRBETRIQ) 50 MG TB24 tablet Take 1 tablet (50 mg total) by mouth daily. 06/06/17  Yes McGowan, Larene Beach A, PA-C  mirtazapine (REMERON) 15 MG tablet mirtazapine 15 mg tablet 04/29/16  Yes [provider]  Multiple Vitamin (MULTIVITAMIN WITH MINERALS) TABS tablet Take 1 tablet by mouth daily.   Yes  [provider]  nabumetone (RELAFEN) 750 MG tablet Take 750 mg by mouth daily.    Yes [provider]  OVER THE COUNTER MEDICATION Place 1 drop into both eyes daily as needed (dry eyes). Eye drops   Yes [provider]  Probiotic Product (PROBIOTIC PO) Take 1 tablet by mouth daily.    Yes [provider]  saxagliptin HCl (ONGLYZA) 5 MG TABS tablet Onglyza 5 mg tablet   Yes [provider]  TOUJEO SOLOSTAR 300 UNIT/ML SOPN  09/18/17  Yes [provider]  liraglutide (VICTOZA) 18 MG/3ML SOPN Inject into the skin.    [provider]    Allergies as of 11/27/2017  . (No Known Allergies)    Family History  Problem Relation Age of Onset  . Heart failure  Father   . Breast cancer Mother   . Other Sister        DEGEN.DISC DISEASE  . Kidney cancer Neg Hx   . Kidney disease Neg Hx   . Prostate cancer Neg Hx     Social History   Socioeconomic History  . Marital status: Single    Spouse name: Not on file  . Number of children: Not on file  . Years of education: Not on file  . Highest education level: Not on file  Occupational History  . Not on file  Social Needs  . Financial resource strain: Not on file  . Food insecurity:    Worry: Not on file    Inability: Not on file  . Transportation needs:    Medical: Not on file    Non-medical: Not on file  Tobacco Use  . Smoking status: Never Smoker  . Smokeless tobacco: Never Used  Substance and Sexual Activity  . Alcohol use: No  . Drug use: No  . Sexual activity: Not on file    Comment: HYST  Lifestyle  . Physical activity:    Days per week: Not on file    Minutes per session: Not on file  . Stress: Not on file  Relationships  . Social connections:    Talks on phone: Not on file    Gets together: Not on file    Attends religious service: Not on file    Active member of club or organization: Not on file    Attends meetings of clubs or organizations: Not on file    Relationship status: Not on file  . Intimate partner violence:    Fear of current or ex partner: Not on file    Emotionally abused: Not on file    Physically abused: Not on file    Forced sexual activity: Not on file  Other Topics Concern  . Not on file  Social History Narrative  . Not on file    Review of Systems: See HPI, otherwise negative ROS  Physical Exam: BP (!) 144/89   Pulse 83   Temp (!) 97.2 F (36.2 C) (Tympanic)   Resp 20   Ht 5' (1.524 m)   Wt 83.9 kg   SpO2 99%   BMI 36.13 kg/m  General:   Alert,  pleasant and cooperative in NAD Head:  Normocephalic and atraumatic. Neck:  Supple; no masses or thyromegaly. Lungs:  Clear throughout to auscultation, normal respiratory effort.      Heart:  +S1, +S2, Regular rate and rhythm, No edema. Abdomen:  Soft, nontender and nondistended. Normal bowel sounds, without guarding, and without rebound.   Neurologic:  Alert and  oriented x4;  grossly normal neurologically.  Impression/Plan: Madeline Martinez is here for an colonoscopy to be performed for Screening colonoscopy average risk   Risks, benefits, limitations, and alternatives regarding  colonoscopy have been reviewed with the patient.  Questions have been answered.  All parties agreeable.   Madeline Bellows, MD  12/19/2017, 8:07 AM

## 2017-12-21 LAB — SURGICAL PATHOLOGY

## 2017-12-22 ENCOUNTER — Encounter: Payer: Self-pay | Admitting: Gastroenterology

## 2017-12-23 ENCOUNTER — Encounter: Payer: Self-pay | Admitting: Gastroenterology

## 2018-03-12 ENCOUNTER — Ambulatory Visit: Payer: Medicare Other | Admitting: Urology

## 2018-03-19 ENCOUNTER — Other Ambulatory Visit: Payer: Self-pay

## 2018-03-19 ENCOUNTER — Ambulatory Visit
Admission: EM | Admit: 2018-03-19 | Discharge: 2018-03-19 | Disposition: A | Discharge status: Home / Self Care | Payer: Medicare HMO | Attending: Family Medicine | Admitting: Family Medicine

## 2018-03-19 DIAGNOSIS — J0110 Acute frontal sinusitis, unspecified: Secondary | ICD-10-CM

## 2018-03-19 DIAGNOSIS — R05 Cough: Secondary | ICD-10-CM | POA: Diagnosis not present

## 2018-03-19 DIAGNOSIS — R059 Cough, unspecified: Secondary | ICD-10-CM

## 2018-03-19 MED ORDER — BENZONATATE 100 MG PO CAPS: 100.0000 mg | ORAL_CAPSULE | Freq: Three times a day (TID) | ORAL | 0 refills | Status: DC | PRN

## 2018-03-19 MED ORDER — DOXYCYCLINE HYCLATE 100 MG PO CAPS: 100.0000 mg | ORAL_CAPSULE | Freq: Two times a day (BID) | ORAL | 0 refills | Status: AC

## 2018-03-19 NOTE — ED Provider Notes (Signed)
[facility]    CSN: [id number] Arrival date & time: 03/19/18  3762     History   Chief Complaint Chief Complaint  Patient presents with  . Appointment  . Fatigue  . Cough    HPI Madeline Martinez is a 73 y.o. female.   73 year old female accompanied by her daughter with concern over headache, facial pressure and sinus pain along with fatigue over the past 5 to 6 days. Feels congested but unable to get any mucus to come out. Also having a mild cough, sore throat, decreased appetite and night sweats. Denies any distinct fever, vomiting or diarrhea. Has not taken any OTC medications for symptoms. Has tried drinking tea with minimal relief. Has history of multiple chronic health issues including Diabetes, HTN, hyperlipidemia, GERD, gout, anxiety and depression. Currently on over 15 medications including 3 blood pressure meds, 4 diabetes medications including insulin, Lipitor, Allopurinol, Remeron, Linzess, Relafen, Melatonin, and Robaxin prn.   The history is provided by the patient and a caregiver.    Past Medical History:  Diagnosis Date  . Acid reflux   . Anxiety   . Depression   . Diabetes mellitus   . Gout   . HTN (hypertension)   . Hyperlipidemia   . Sleep apnea     Patient Active Problem List   Diagnosis Date Noted  . HTN (hypertension)   . Diabetes mellitus   . Hyperlipidemia   . Depression     Past Surgical History:  Procedure Laterality Date  . ABDOMINAL HYSTERECTOMY    . BACK SURGERY  2007   DISCS  . COLONOSCOPY WITH PROPOFOL N/A 12/19/2017   Procedure: COLONOSCOPY WITH PROPOFOL;  Surgeon: Jonathon Bellows, MD;  Location: [facility] ENDOSCOPY;  Service: Gastroenterology;  Laterality: N/A;  . 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 PARTIAL HYSTERECTOMY  1986    OB History    Gravida  2   Para  1   Term  1   Preterm      AB  1   Living  1     SAB      TAB      Ectopic      Multiple      Live Births                Home Medications    Prior to Admission medications   Medication Sig Start Date End Date Taking? Authorizing Provider  allopurinol (ZYLOPRIM) 100 MG tablet allopurinol 100 mg tablet 04/30/16   [provider]  atorvastatin (LIPITOR) 80 MG tablet Take 80 mg by mouth daily.      [provider]  benzonatate (TESSALON) 100 MG capsule Take 1 capsule (100 mg total) by mouth 3 (three) times daily as needed for cough. 03/19/18   Katy Apo, NP  Bisacodyl (DULCOLAX PO) Take 1 tablet by mouth daily as needed (constipation).     [provider]  carvedilol (COREG) 25 MG tablet Take 25 mg by mouth 2 (two) times daily.      [provider]  cetirizine (ZYRTEC) 10 MG tablet  04/20/17   [provider]  Cholecalciferol (VITAMIN D PO) Take 1 tablet by mouth daily.    [provider]  colchicine 0.6 MG tablet Take 0.6 mg by mouth 2 (two) times daily as needed (gout).     [provider]  diltiazem (DILACOR XR) 180 MG 24 hr capsule Take 180 mg by mouth daily.  [provider]  doxycycline (VIBRAMYCIN) 100 MG capsule Take 1 capsule (100 mg total) by mouth 2 (two) times daily for 7 days. 03/19/18 03/26/18  Katy Apo, NP  glyBURIDE (DIABETA) 5 MG tablet Take 10 mg by mouth 2 (two) times daily.      [provider]  LINZESS 145 MCG CAPS capsule Take 145 mcg by mouth daily. 02/21/17   [provider]  losartan (COZAAR) 100 MG tablet Take 100 mg by mouth daily.   02/14/10   [provider]  MELATONIN PO Take 1 tablet by mouth at bedtime.    [provider]  metFORMIN (GLUCOPHAGE) 1000 MG tablet Take 1,000 mg by mouth 2 (two) times daily.      [provider]  methocarbamol (ROBAXIN) 750 MG tablet methocarbamol 750 mg tablet 05/10/16   [provider]  mirabegron ER (MYRBETRIQ) 50 MG TB24 tablet Take 1 tablet (50 mg total) by mouth daily. 06/06/17   Zara Council A, PA-C   mirtazapine (REMERON) 15 MG tablet mirtazapine 15 mg tablet 04/29/16   [provider]  Multiple Vitamin (MULTIVITAMIN WITH MINERALS) TABS tablet Take 1 tablet by mouth daily.    [provider]  nabumetone (RELAFEN) 750 MG tablet Take 750 mg by mouth daily.     [provider]  OVER THE COUNTER MEDICATION Place 1 drop into both eyes daily as needed (dry eyes). Eye drops    [provider]  Probiotic Product (PROBIOTIC PO) Take 1 tablet by mouth daily.     [provider]  saxagliptin HCl (ONGLYZA) 5 MG TABS tablet Onglyza 5 mg tablet    [provider]  TOUJEO SOLOSTAR 300 UNIT/ML SOPN  09/18/17   [provider]    Family History Family History  Problem Relation Age of Onset  . Heart failure Father   . Breast cancer Mother   . Other Sister        DEGEN.DISC DISEASE  . Kidney cancer Neg Hx   . Kidney disease Neg Hx   . Prostate cancer Neg Hx     Social History Social History   Tobacco Use  . Smoking status: Never Smoker  . Smokeless tobacco: Never Used  Substance Use Topics  . Alcohol use: No  . Drug use: No     Allergies   Patient has no known allergies.   Review of Systems Review of Systems  Constitutional: Positive for appetite change and fatigue. Negative for activity change, chills and fever.  HENT: Positive for congestion, postnasal drip, sinus pressure, sinus pain and sore throat. Negative for ear discharge, ear pain, facial swelling, mouth sores, nosebleeds, rhinorrhea, sneezing and trouble swallowing.   Eyes: Negative for pain, discharge and itching.  Respiratory: Positive for cough. Negative for chest tightness, shortness of breath and wheezing.   Gastrointestinal: Positive for nausea. Negative for abdominal pain, diarrhea and vomiting.  Musculoskeletal: Positive for arthralgias and myalgias. Negative for neck pain.  Skin: Negative for color change, rash and wound.  Allergic/Immunologic: Positive  for environmental allergies.  Neurological: Positive for headaches. Negative for dizziness, tremors, seizures, syncope, weakness, light-headedness and numbness.  Hematological: Negative for adenopathy. Does not bruise/bleed easily.  Psychiatric/Behavioral: Positive for sleep disturbance.     Physical Exam Triage Vital Signs ED Triage Vitals  Enc Vitals Group     BP 03/19/18 0855 (!) 165/91     Pulse Rate 03/19/18 0855 (!) 105     Resp 03/19/18 0855 18  Temp 03/19/18 0855 98.6 F (37 C)     Temp Source 03/19/18 0855 Oral     SpO2 03/19/18 0855 97 %     Weight 03/19/18 0856 190 lb (86.2 kg)     Height 03/19/18 0856 5' (1.524 m)     Head Circumference --      Peak Flow --      Pain Score 03/19/18 0856 4     Pain Loc --      Pain Edu? --      Excl. in [place]? --    No data found.  Updated Vital Signs BP (!) 165/91 (BP Location: Left Arm)   Pulse (!) 105   Temp 98.6 F (37 C) (Oral)   Resp 18   Ht 5' (1.524 m)   Wt 190 lb (86.2 kg)   SpO2 97%   BMI 37.11 kg/m   Visual Acuity Right Eye Distance:   Left Eye Distance:   Bilateral Distance:    Right Eye Near:   Left Eye Near:    Bilateral Near:     Physical Exam Vitals signs reviewed.  Constitutional:      General: She is awake. She is not in acute distress.    Appearance: Normal appearance. She is well-developed, well-groomed and overweight. She is ill-appearing.     Comments: Patient lying down on exam table in no acute distress but appears ill.   HENT:     Head: Normocephalic and atraumatic.     Right Ear: Hearing, tympanic membrane, ear canal and external ear normal.     Left Ear: Hearing, tympanic membrane, ear canal and external ear normal.     Nose: Congestion present.     Right Turbinates: Enlarged and swollen.     Left Turbinates: Enlarged and swollen.     Right Sinus: Maxillary sinus tenderness and frontal sinus tenderness present.     Left Sinus: Maxillary sinus tenderness and frontal sinus tenderness  present.     Mouth/Throat:     Lips: Pink.     Mouth: Mucous membranes are moist.     Dentition: Has dentures.     Pharynx: Uvula midline. Oropharyngeal exudate (slight yellowish post nasal drainage present) and posterior oropharyngeal erythema present. No pharyngeal swelling or uvula swelling.  Eyes:     Extraocular Movements: Extraocular movements intact.     Conjunctiva/sclera: Conjunctivae normal.  Neck:     Musculoskeletal: Normal range of motion and neck supple. No neck rigidity or muscular tenderness.  Cardiovascular:     Rate and Rhythm: Regular rhythm. Tachycardia present.     Pulses: Normal pulses.     Heart sounds: Normal heart sounds. No murmur.  Pulmonary:     Effort: Pulmonary effort is normal. No respiratory distress.     Breath sounds: Normal breath sounds and air entry. No decreased breath sounds, wheezing, rhonchi or rales.  Musculoskeletal: Normal range of motion.  Skin:    General: Skin is warm and dry.     Capillary Refill: Capillary refill takes less than 2 seconds.  Neurological:     General: No focal deficit present.     Mental Status: She is alert and oriented to person, place, and time.  Psychiatric:        Mood and Affect: Mood normal.        Behavior: Behavior normal. Behavior is cooperative.      UC Treatments / Results  Labs (all labs ordered are listed, but only abnormal results are displayed) Labs  Reviewed - No data to display  EKG None  Radiology No results found.  Procedures Procedures (including critical care time)  Medications Ordered in UC Medications - No data to display  Initial Impression / Assessment and Plan / UC Course  I have reviewed the triage vital signs and the nursing notes.  Pertinent labs & imaging results that were available during my care of the patient were reviewed by me and considered in my medical decision making (see chart for details).    Discussed with patient and daughter that she appears to have an  early sinusitis. Discussed treatment options- daughter indicated that her mom has done well on Doxycycline in the past. Will trial Doxycycline 100mg  twice a day as directed. May take Tessalon cough pills 1 every 8 hours as needed. Continue to push fluids. May also take OTC Delsym cough syrup as needed at night. Avoid decongestants which can raise blood pressure. Recommend follow-up with her PCP in 2 to 3 days if not improving.   Final Clinical Impressions(s) / UC Diagnoses   Final diagnoses:  Acute non-recurrent frontal sinusitis  Cough     Discharge Instructions     Recommend start Doxycycline 100mg  twice a day as directed. May take Tessalon cough pills 1 every 8 hours as needed. Continue to increase fluids to help loosen up mucus. May take OTC Delsym cough syrup at night to help with cough and sleep. Follow-up with your PCP in 2 to 3 days if not improving.     ED Prescriptions    Medication Sig Dispense Auth. Provider   doxycycline (VIBRAMYCIN) 100 MG capsule Take 1 capsule (100 mg total) by mouth 2 (two) times daily for 7 days. 14 capsule Katy Apo, NP   benzonatate (TESSALON) 100 MG capsule Take 1 capsule (100 mg total) by mouth 3 (three) times daily as needed for cough. 30 capsule Katy Apo, NP     Controlled Substance Prescriptions [place] Controlled Substance Registry consulted? Not Applicable   Katy Apo, NP 03/19/18 2125

## 2018-03-19 NOTE — ED Triage Notes (Signed)
Pt with facial pain, head pressure, cough, and fatigue. Pt reports sx started over the weekend.

## 2018-03-19 NOTE — Discharge Instructions (Addendum)
Recommend start Doxycycline 100mg  twice a day as directed. May take Tessalon cough pills 1 every 8 hours as needed. Continue to increase fluids to help loosen up mucus. May take OTC Delsym cough syrup at night to help with cough and sleep. Follow-up with your PCP in 2 to 3 days if not improving.

## 2018-03-28 ENCOUNTER — Ambulatory Visit: Payer: Self-pay | Admitting: Urology

## 2018-06-19 ENCOUNTER — Ambulatory Visit (INDEPENDENT_AMBULATORY_CARE_PROVIDER_SITE_OTHER): Payer: Medicare HMO | Admitting: Gastroenterology

## 2018-06-19 DIAGNOSIS — R140 Abdominal distension (gaseous): Secondary | ICD-10-CM | POA: Diagnosis not present

## 2018-06-19 DIAGNOSIS — K5900 Constipation, unspecified: Secondary | ICD-10-CM

## 2018-06-19 DIAGNOSIS — R1310 Dysphagia, unspecified: Secondary | ICD-10-CM | POA: Diagnosis not present

## 2018-06-19 NOTE — Progress Notes (Signed)
Madeline Martinez  [address]  [place]  [place], [place] [address]  Main: [phone]  Fax: [phone]   Gastroenterology Consultation  Referring Provider:     [name], MD Primary Care Physician:  [name], MD Reason for Consultation:     Dysphagia        HPI:   Virtual Visit via video  Note  I connected with patient on 06/19/18 at  9:30 AM EDT by video  and verified that I am speaking with the correct person using two identifiers.   I discussed the limitations, risks, security and privacy concerns of performing an evaluation and management service by video and the availability of in person appointments. I also discussed with the patient that there may be a patient responsible charge related to this service. The patient expressed understanding and agreed to proceed.  Location of the patient: Home Location of provider: Home Participating persons: Patient and provider only   History of Present Illness: Chief Complaint  Patient presents with  . Dysphagia     Madeline Martinez is a 73 y.o. y/o female referred for consultation & management  by Dr. [name], MD .  She says her stomach is swelling and has a lot og gas, pain in her naval area.    Dysphagia: Onset and any progression: 1 month , getting worse  Frequency: almost every day last week  Foods affected : meats, usually not liquids  Prior episodes of impaction: no2 History of asthma/allergy : no  History of heartburn/Reflux : no -  Weight loss/weight gain : 5 lbs gain in weight - not able to exercise due to back spasms  Prior EGD: no PPI/H2 blocker use : Prilosec - once in few days , after food  Smoking history : does not smoke   Uses stevia x1 daily , crystalite- 2 glasses  , coffee mate    She has a bowel movement 1-2 times a day - small in qty , feels when she has a good bowel movement she has less bloating and gas, Takes linzess daily    Past Medical History:  Diagnosis Date  .  Acid reflux   . Anxiety   . Depression   . Diabetes mellitus   . Gout   . HTN (hypertension)   . Hyperlipidemia   . Sleep apnea     Past Surgical History:  Procedure Laterality Date  . ABDOMINAL HYSTERECTOMY    . BACK SURGERY  2007   DISCS  . COLONOSCOPY WITH PROPOFOL N/A 12/19/2017   Procedure: COLONOSCOPY WITH PROPOFOL;  Surgeon: Madeline Bellows, MD;  Location: [facility] ENDOSCOPY;  Service: Gastroenterology;  Laterality: N/A;  . 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 PARTIAL HYSTERECTOMY  1986    Prior to Admission medications   Medication Sig Start Date End Date Taking? Authorizing Provider  allopurinol (ZYLOPRIM) 100 MG tablet allopurinol 100 mg tablet 04/30/16  Yes [provider]  atorvastatin (LIPITOR) 80 MG tablet Take 80 mg by mouth daily.     Yes [provider]  carvedilol (COREG) 25 MG tablet Take 25 mg by mouth 2 (two) times daily.     Yes [provider]  cetirizine (ZYRTEC) 10 MG tablet  04/20/17  Yes [provider]  Cholecalciferol (VITAMIN D PO) Take 1 tablet by mouth daily.   Yes [provider]  diltiazem (DILACOR XR) 180 MG 24 hr capsule Take 180 mg by mouth daily.  Yes [provider]  glyBURIDE (DIABETA) 5 MG tablet Take 10 mg by mouth 2 (two) times daily.     Yes [provider]  LINZESS 145 MCG CAPS capsule Take 145 mcg by mouth daily. 02/21/17  Yes [provider]  losartan (COZAAR) 100 MG tablet Take 100 mg by mouth daily.   02/14/10  Yes [provider]  metFORMIN (GLUCOPHAGE) 1000 MG tablet Take 1,000 mg by mouth 2 (two) times daily.     Yes [provider]  methocarbamol (ROBAXIN) 750 MG tablet methocarbamol 750 mg tablet 05/10/16  Yes [provider]  Multiple Vitamin (MULTIVITAMIN WITH MINERALS) TABS tablet Take 1 tablet by mouth daily.   Yes [provider]  nabumetone (RELAFEN) 750 MG tablet Take 750 mg by mouth daily.    Yes [provider]  OVER THE COUNTER MEDICATION Place 1 drop into both eyes daily as needed (dry eyes). Eye drops   Yes [provider]  Probiotic Product (PROBIOTIC PO) Take 1 tablet by mouth daily.    Yes [provider]  saxagliptin HCl (ONGLYZA) 5 MG TABS tablet Onglyza 5 mg tablet   Yes [provider]  TOUJEO SOLOSTAR 300 UNIT/ML SOPN  09/18/17  Yes [provider]  benzonatate (TESSALON) 100 MG capsule Take 1 capsule (100 mg total) by mouth 3 (three) times daily as needed for cough. Patient not taking: Reported on 06/19/2018 03/19/18   Katy Apo, NP  Bisacodyl (DULCOLAX PO) Take 1 tablet by mouth daily as needed (constipation).     [provider]  colchicine 0.6 MG tablet Take 0.6 mg by mouth 2 (two) times daily as needed (gout).     [provider]  MELATONIN PO Take 1 tablet by mouth at bedtime.    [provider]  mirabegron ER (MYRBETRIQ) 50 MG TB24 tablet Take 1 tablet (50 mg total) by mouth daily. Patient not taking: Reported on 06/19/2018 06/06/17   Zara Council A, PA-C  mirtazapine (REMERON) 15 MG tablet mirtazapine 15 mg tablet 04/29/16   [provider]    Family History  Problem Relation Age of Onset  . Heart failure Father   . Breast cancer Mother   . Other Sister        DEGEN.DISC DISEASE  . Kidney cancer Neg Hx   . Kidney disease Neg Hx   . Prostate cancer Neg Hx      Social History   Tobacco Use  . Smoking status: Never Smoker  . Smokeless tobacco: Never Used  Substance Use Topics  . Alcohol use: No  . Drug use: No    Allergies as of 06/19/2018  . (No Known Allergies)    Review of Systems:    All systems reviewed and negative except where noted in HPI. General Appearance:    Alert, cooperative, no distress, appears stated age  Head:    Normocephalic, without obvious abnormality, atraumatic  Eyes:    PERRL, conjunctiva/corneas clear,  Ears:    Grossly normal hearing     Neurologic:   Grossly appears normal     Observations/Objective:  Labs: CBC    Component Value Date/Time   WBC 6.4 04/06/2013 1135   RBC 4.34 04/06/2013 1135   HGB 13.5 04/06/2013 1135   HCT 40.5 04/06/2013 1135   PLT 389 04/06/2013 1135   MCV 93.3 04/06/2013 1135   MCH 31.1 04/06/2013 1135   MCHC 33.3 04/06/2013 1135   RDW 14.8 04/06/2013 1135  LYMPHSABS 2.9 04/06/2013 1135   MONOABS 0.6 04/06/2013 1135   EOSABS 0.4 04/06/2013 1135   BASOSABS 0.0 04/06/2013 1135   CMP     Component Value Date/Time   NA 142 04/06/2013 1135   K 4.7 04/06/2013 1135   CL 104 04/06/2013 1135   CO2 22 04/06/2013 1135   GLUCOSE 176 (H) 04/06/2013 1135   BUN 12 04/06/2013 1135   CREATININE 0.78 04/06/2013 1135   CALCIUM 9.5 04/06/2013 1135   PROT 7.8 04/06/2013 1135   ALBUMIN 3.9 04/06/2013 1135   AST 31 04/06/2013 1135   ALT 28 04/06/2013 1135   ALKPHOS 73 04/06/2013 1135   BILITOT 0.4 04/06/2013 1135   GFRNONAA 85 (L) 04/06/2013 1135   GFRAA >90 04/06/2013 1135    Imaging Studies: No results found.  Assessment and Plan:   Madeline Martinez is a 73 y.o. y/o female has been referred for dysphagia. She also has gas,bloating and constipation. Last colonoscopy 11/2017 - no large masses  Plan  1. Dysphagia- short trial of PPI taken daily for 2 weeks- if symptoms do not get better then will ned EGD- she is aware that we trying this approach due to the COVID as agree with the plan  2. Gas/bloating- likely a combination of constipation/use of artificial sugars, suggest increase linzess to 290 mcg a day, stop al artifical sugars  Follow Up Instructions:  F/u in 2 weeks   I discussed the assessment and treatment plan with the patient. The patient was provided an opportunity to ask questions and all were answered. The patient agreed with the plan and demonstrated an understanding of the instructions.   The patient was advised to call back or seek an in-person evaluation if the symptoms  worsen or if the condition fails to improve as anticipated.    Dr Madeline Bellows MD,MRCP [facility]) Gastroenterology/Hepatology Pager: [phone]   Speech recognition software was used to dictate the above note.

## 2018-06-27 ENCOUNTER — Ambulatory Visit: Payer: Medicare HMO | Admitting: Gastroenterology

## 2018-07-07 ENCOUNTER — Ambulatory Visit (INDEPENDENT_AMBULATORY_CARE_PROVIDER_SITE_OTHER): Payer: Medicare HMO | Admitting: Gastroenterology

## 2018-07-07 DIAGNOSIS — R140 Abdominal distension (gaseous): Secondary | ICD-10-CM | POA: Diagnosis not present

## 2018-07-07 DIAGNOSIS — R1310 Dysphagia, unspecified: Secondary | ICD-10-CM | POA: Diagnosis not present

## 2018-07-07 NOTE — Progress Notes (Signed)
Madeline Martinez , MD [address]  [place], [place] [address]  Main: [phone]  Fax: [phone]   Primary Care Physician: [name], MD  Virtual Visit via Video Note  I connected with patient on 07/07/18 at 10:00 AM EDT by video and verified that I am speaking with the correct person using two identifiers.   I discussed the limitations, risks, security and privacy concerns of performing an evaluation and management service by video  and the availability of in person appointments. I also discussed with the patient that there may be a patient responsible charge related to this service. The patient expressed understanding and agreed to proceed.  Location of Patient: Home Location of Provider: Home Persons involved: Patient and provider only   History of Present Illness: Chief Complaint  Patient presents with   Follow-up    Dysphagia    HPI: Madeline Martinez is a 73 y.o. female  F/u for dysphagia, gas and bloating.   Summary of history :  Initially referred and see 06/19/2018 for dysphagia. On going for over a month, every day , usually for meats but not liquids. No reflux like symptoms. Gained weight recently. Occasionally took Prilosec. Uses stevia x1 daily , crystalite- 2 glasses  , coffee mate    She has a bowel movement 1-2 times a day - small in qty , feels when she has a good bowel movement she has less bloating and gas, Takes linzess daily    Interval history   06/19/2018- 07/07/2018  She says she is taking the linzess 2 times a day , prilosec . Swallowing is much better but not resolved. Bloating is better. Stopped most of the artificial sugars. Overall thinks her bloating.     Current Outpatient Medications  Medication Sig Dispense Refill   allopurinol (ZYLOPRIM) 100 MG tablet allopurinol 100 mg tablet     atorvastatin (LIPITOR) 80 MG tablet Take 80 mg by mouth daily.       Bisacodyl (DULCOLAX PO) Take 1 tablet by mouth daily as needed  (constipation).      carvedilol (COREG) 25 MG tablet Take 25 mg by mouth 2 (two) times daily.       cetirizine (ZYRTEC) 10 MG tablet      Cholecalciferol (VITAMIN D PO) Take 1 tablet by mouth daily.     colchicine 0.6 MG tablet Take 0.6 mg by mouth 2 (two) times daily as needed (gout).      diltiazem (DILACOR XR) 180 MG 24 hr capsule Take 180 mg by mouth daily.       glyBURIDE (DIABETA) 5 MG tablet Take 10 mg by mouth 2 (two) times daily.       LINZESS 145 MCG CAPS capsule Take 145 mcg by mouth daily.  1   losartan (COZAAR) 100 MG tablet Take 100 mg by mouth daily.       MELATONIN PO Take 1 tablet by mouth at bedtime.     metFORMIN (GLUCOPHAGE) 1000 MG tablet Take 1,000 mg by mouth 2 (two) times daily.       methocarbamol (ROBAXIN) 750 MG tablet methocarbamol 750 mg tablet     mirtazapine (REMERON) 15 MG tablet mirtazapine 15 mg tablet     Multiple Vitamin (MULTIVITAMIN WITH MINERALS) TABS tablet Take 1 tablet by mouth daily.     nabumetone (RELAFEN) 750 MG tablet Take 750 mg by mouth daily.      OVER THE COUNTER MEDICATION Place 1 drop into both eyes daily  as needed (dry eyes). Eye drops     Probiotic Product (PROBIOTIC PO) Take 1 tablet by mouth daily.      saxagliptin HCl (ONGLYZA) 5 MG TABS tablet Onglyza 5 mg tablet     TOUJEO SOLOSTAR 300 UNIT/ML SOPN      benzonatate (TESSALON) 100 MG capsule Take 1 capsule (100 mg total) by mouth 3 (three) times daily as needed for cough. (Patient not taking: Reported on 06/19/2018) 30 capsule 0   mirabegron ER (MYRBETRIQ) 50 MG TB24 tablet Take 1 tablet (50 mg total) by mouth daily. (Patient not taking: Reported on 06/19/2018) 90 tablet 3   No current facility-administered medications for this visit.     Allergies as of 07/07/2018   (No Known Allergies)    Review of Systems:    All systems reviewed and negative except where noted in HPI.  General Appearance:    Alert, cooperative, no distress, appears stated age  Head:     Normocephalic, without obvious abnormality, atraumatic  Eyes:    PERRL, conjunctiva/corneas clear,  Ears:    Grossly normal hearing    Neurologic:  Grossly normal    Observations/Objective:  Labs: CMP     Component Value Date/Time   NA 142 04/06/2013 1135   K 4.7 04/06/2013 1135   CL 104 04/06/2013 1135   CO2 22 04/06/2013 1135   GLUCOSE 176 (H) 04/06/2013 1135   BUN 12 04/06/2013 1135   CREATININE 0.78 04/06/2013 1135   CALCIUM 9.5 04/06/2013 1135   PROT 7.8 04/06/2013 1135   ALBUMIN 3.9 04/06/2013 1135   AST 31 04/06/2013 1135   ALT 28 04/06/2013 1135   ALKPHOS 73 04/06/2013 1135   BILITOT 0.4 04/06/2013 1135   GFRNONAA 85 (L) 04/06/2013 1135   GFRAA >90 04/06/2013 1135   Lab Results  Component Value Date   WBC 6.4 04/06/2013   HGB 13.5 04/06/2013   HCT 40.5 04/06/2013   MCV 93.3 04/06/2013   PLT 389 04/06/2013    Imaging Studies: No results found.  Assessment and Plan:   Madeline Martinez is a 73 y.o. y/o female here to follow up for dysphagia,  gas,bloating and constipation. Last colonoscopy 11/2017 - no large masses. Overall feeling better since increasing PPI  Plan  1. Dysphagia- Symptoms better but not resolved. Suggested EGD to r/o any malignancy or stricture- she is not keen to come in due to [place]. She will decide in 3 weeks. Continue PPI daily   2. Bloating- Doing well on linzess, she will use charcoal PRN     I discussed the assessment and treatment plan with the patient. The patient was provided an opportunity to ask questions and all were answered. The patient agreed with the plan and demonstrated an understanding of the instructions.   The patient was advised to call back or seek an in-person evaluation if the symptoms worsen or if the condition fails to improve as anticipated.    Dr Madeline Bellows MD,MRCP [facility]) Gastroenterology/Hepatology Pager: [phone]   Speech recognition software was used to dictate this note.

## 2018-07-30 ENCOUNTER — Telehealth: Payer: Self-pay | Admitting: Gastroenterology

## 2018-07-30 ENCOUNTER — Other Ambulatory Visit: Payer: Self-pay

## 2018-07-30 MED ORDER — LINACLOTIDE 290 MCG PO CAPS: 290.0000 ug | ORAL_CAPSULE | Freq: Every day | ORAL | 1 refills | Status: DC

## 2018-07-30 NOTE — Telephone Encounter (Signed)
Pt is calling to get rx Linzess she states she is out and Dr. Vicente Males changed the prescription to 2 times a day

## 2018-07-30 NOTE — Telephone Encounter (Signed)
Spoke with pt and explained that we can send a prescription for Linzess 290 daily instead of Linzess 145 mg twice daily. Pt agrees. New prescription has been sent to pt preferred pharmacy.

## 2018-07-31 ENCOUNTER — Ambulatory Visit: Payer: Self-pay | Admitting: Urology

## 2018-08-03 NOTE — Progress Notes (Signed)
08/04/2018 3:00 PM   Madeline Martinez 04-26-1945 [id number]  Referring provider: [name], [place], [place] [address]  Chief Complaint  Patient presents with  . Nocturia    HPI: Patient is a 73 year old African-American female with nighttime incontinence and nocturia who presents today for a follow up visit.  Her daughter, Madeline Martinez, is on speaker phone.   Background history Patient is a 73 -year-old African-American female who is referred to [place] by Dr.[name] for urinary incontinence with her daughter, Madeline Martinez.  She is not a clear historian.  She has difficulty with the specifics of her condition.  Patient states that she has had urinary incontinence for awhile.  Patient has incontinence at night.  She states she just wakes up wet.  She denies any issues during the day.  She is experiencing a strong urgency and nocturia x 2.  Her incontinence volume is moderate to large.   She states that she has not had any night time incontinence for the last two weeks.  Her recent HbgA1c was found to be 10% which is up from 7% three months ago.  She has had some recent changes with her DM medications.  She was told by her gynecologist that she had an UTI, but she denied any symptoms of dysuria, fevers, gross hematuria.  Patient denies any gross hematuria, dysuria or suprapubic/flank pain.  Patient denies any fevers, chills, nausea or vomiting.  She is post menopausal.  She denies constipation and/or diarrhea.   She has not had any recent imaging studies.  She is drinking a lot of water daily into the night.  She is drinking two caffeinated beverages daily.  She is not drinking alcoholic beverages daily.  She has sleep apnea, but she does admit that she sometimes does not sleep with her CPAP machine.   Her PVR is 0 mL.    At her visit on April 23, 2017, she was started on Myrbetriq 25 mg daily and advised to take it at night, to be more diligent in using her CPAP machine and to get tighter  control with her DM.    On 05/14/2017, she was experiencing urgency x 4-7, frequency x 4-7, is restricting fluids to avoid visits to the restroom, is engaging in toilet mapping, incontinence x 4-7 and nocturia x 0-3.  Her PVR was 0 mL. Her BP was 159/78.  Patient denies any gross hematuria, dysuria or suprapubic/flank pain.  Patient denies any fevers, chills, nausea or vomiting.   She feels like she has had some benefit with Myrbetriq 25 mg daily, but she would like to have greater control.  (06/05/2017), she has been experiencing urgency x 0-3 (improved), frequency x 4-7  (unchanged), not restricting fluids to avoid visits to the restroom, is engaging in toilet mapping, incontinence x 0-3 (improved) and nocturia x 0-3 (unchanged).   Her PVR is 68 mL.  Her BP is 158/84.  Patient denies any gross hematuria, dysuria or suprapubic/flank pain.  Patient denies any fevers, chills, nausea or vomiting.   She states she has found that she is reaching her goals with the Myrbetriq 50 mg daily.  She is also taking her CPAP machine with her on trips.    (09/20/2018) The patient has been experiencing urgency x 0-3 (unchanged), frequency x 4-7 (unchanged), not restricting fluids to avoid visits to the restroom, is engaging in toilet mapping, incontinence x 4-7 (worse) and nocturia x 0-3 (unchanged).  Patient denies any gross hematuria, dysuria or suprapubic/flank pain.  Patient denies any fevers, chills, nausea or vomiting.  PVR is 79 mL.  BP is 131/72.    She had two episodes of incontinence during a vacation where she was eating and drinking late into the evenings.  She is on Myrbetriq 50 mg daily.    The patient is  experiencing urgency x 4-7 (worsening), frequency x 4-7 (unchanged), is restricting fluids to avoid visits to the restroom, is engaging in toilet mapping, incontinence x 4-7 (unchanged) and nocturia x 4-7 (worsened).   Her BP is 181/93.   Her PVR is 78 mL.   She is complaining of frequency, urgency,  incontinence and intermittency.  She is also experiencing constipation.  She is having to wear depends at night as the night time incontinence is worse.  She is on Myrbetriq 50 mg daily.    She is also having urgency and urge incontinence which has started three weeks ago.  Patient denies any gross hematuria, dysuria or suprapubic/flank pain.  Patient denies any fevers, chills, nausea or vomiting.   Her most recent Hgb A1c was 8%.  She has sleep apnea and sleeps with a CPAP machine.     Her UA today is negative.    PMH: Past Medical History:  Diagnosis Date  . Acid reflux   . Anxiety   . Depression   . Diabetes mellitus   . Gout   . HTN (hypertension)   . Hyperlipidemia   . Sleep apnea     Surgical History: Past Surgical History:  Procedure Laterality Date  . ABDOMINAL HYSTERECTOMY    . BACK SURGERY  2007   DISCS  . COLONOSCOPY WITH PROPOFOL N/A 12/19/2017   Procedure: COLONOSCOPY WITH PROPOFOL;  Surgeon: Jonathon Bellows, MD;  Location: [facility] ENDOSCOPY;  Service: Gastroenterology;  Laterality: N/A;  . 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 PARTIAL HYSTERECTOMY  1986    Home Medications:  Allergies as of 08/04/2018   No Known Allergies     Medication List       Accurate as of August 04, 2018  3:00 PM. If you have any questions, ask your nurse or doctor.        STOP taking these medications   benzonatate 100 MG capsule Commonly known as:  TESSALON Stopped by:  Zara Council, PA-C     TAKE these medications   allopurinol 100 MG tablet Commonly known as:  ZYLOPRIM allopurinol 100 mg tablet   atorvastatin 80 MG tablet Commonly known as:  LIPITOR Take 80 mg by mouth daily.   carvedilol 25 MG tablet Commonly known as:  COREG Take 25 mg by mouth 2 (two) times daily.   cetirizine 10 MG tablet Commonly known as:  ZYRTEC   colchicine 0.6 MG tablet Take 0.6 mg by mouth 2 (two) times daily as needed (gout).   diltiazem 180 MG 24 hr capsule Commonly known as:   DILACOR XR Take 180 mg by mouth daily.   DULCOLAX PO Take 1 tablet by mouth daily as needed (constipation).   glyBURIDE 5 MG tablet Commonly known as:  DIABETA Take 10 mg by mouth 2 (two) times daily.   linaclotide 290 MCG Caps capsule Commonly known as:  Linzess Take 1 capsule (290 mcg total) by mouth daily before breakfast.   losartan 100 MG tablet Commonly known as:  COZAAR Take 100 mg by mouth daily.   MELATONIN PO Take 1 tablet by mouth at bedtime.   metFORMIN 1000 MG tablet Commonly known as:  GLUCOPHAGE Take 1,000 mg by mouth 2 (two) times daily.   methocarbamol 750 MG tablet Commonly known as:  ROBAXIN methocarbamol 750 mg tablet   mirabegron ER 50 MG Tb24 tablet Commonly known as:  MYRBETRIQ Take 1 tablet (50 mg total) by mouth daily.   mirtazapine 15 MG tablet Commonly known as:  REMERON mirtazapine 15 mg tablet   multivitamin with minerals Tabs tablet Take 1 tablet by mouth daily.   nabumetone 750 MG tablet Commonly known as:  RELAFEN Take 750 mg by mouth daily.   Onglyza 5 MG Tabs tablet Generic drug:  saxagliptin HCl Onglyza 5 mg tablet   OVER THE COUNTER MEDICATION Place 1 drop into both eyes daily as needed (dry eyes). Eye drops   PROBIOTIC PO Take 1 tablet by mouth daily.   Toujeo SoloStar 300 UNIT/ML Sopn Generic drug:  Insulin Glargine (1 Unit Dial)   VITAMIN D PO Take 1 tablet by mouth daily.       Allergies: No Known Allergies  Family History: Family History  Problem Relation Age of Onset  . Heart failure Father   . Breast cancer Mother   . Other Sister        DEGEN.DISC DISEASE  . Kidney cancer Neg Hx   . Kidney disease Neg Hx   . Prostate cancer Neg Hx     Social History:  reports that she has never smoked. She has never used smokeless tobacco. She reports that she does not drink alcohol or use drugs.  ROS: UROLOGY Frequent Urination?: Yes Hard to postpone urination?: Yes Burning/pain with urination?: No Get up  at night to urinate?: No Leakage of urine?: Yes Urine stream starts and stops?: Yes Trouble starting stream?: No Do you have to strain to urinate?: No Blood in urine?: No Urinary tract infection?: No Sexually transmitted disease?: No Injury to kidneys or bladder?: No Painful intercourse?: No Weak stream?: No Currently pregnant?: No Vaginal bleeding?: No Last menstrual period?: n  Gastrointestinal Nausea?: No Vomiting?: No Indigestion/heartburn?: Yes Diarrhea?: No Constipation?: Yes  Constitutional Fever: No Night sweats?: No Weight loss?: No Fatigue?: Yes  Skin Skin rash/lesions?: No Itching?: No  Eyes Blurred vision?: No Double vision?: No  Ears/Nose/Throat Sore throat?: No Sinus problems?: No  Hematologic/Lymphatic Swollen glands?: No Easy bruising?: No  Cardiovascular Leg swelling?: No Chest pain?: No  Respiratory Cough?: No Shortness of breath?: No  Endocrine Excessive thirst?: No  Musculoskeletal Back pain?: Yes Joint pain?: No  Neurological Headaches?: No Dizziness?: No  Psychologic Depression?: No Anxiety?: No  Physical Exam: BP (!) 181/93   Pulse (!) 116   Wt 205 lb 8 oz (93.2 kg)   BMI 40.13 kg/m   Constitutional:  Well nourished. Alert and oriented, No acute distress. HEENT: [place] AT, moist mucus membranes.  Trachea midline, no masses. Cardiovascular: No clubbing, cyanosis, or edema. Respiratory: Normal respiratory effort, no increased work of breathing. Neurologic: Grossly intact, no focal deficits, moving all 4 extremities. Psychiatric: Normal mood and affect.   Laboratory Data: Lab Results  Component Value Date   WBC 6.4 04/06/2013   HGB 13.5 04/06/2013   HCT 40.5 04/06/2013   MCV 93.3 04/06/2013   PLT 389 04/06/2013    Lab Results  Component Value Date   CREATININE 0.78 04/06/2013    No results found for: PSA  No results found for: TESTOSTERONE  No results found for: HGBA1C  No results found for: TSH      Component Value Date/Time   CHOL 217 (A)  02/14/2010   HDL 61 02/14/2010   LDLCALC 135 02/14/2010    Lab Results  Component Value Date   AST 31 04/06/2013   Lab Results  Component Value Date   ALT 28 04/06/2013   No components found for: ALKALINEPHOPHATASE No components found for: BILIRUBINTOTAL  No results found for: ESTRADIOL  Urinalysis No results found for: COLORURINE, APPEARANCEUR, LABSPEC, PHURINE, GLUCOSEU, HGBUR, BILIRUBINUR, KETONESUR, PROTEINUR, UROBILINOGEN, NITRITE, LEUKOCYTESUR  I have reviewed the labs.   Pertinent Imaging: Results for JANEAH, KOVACICH (MRN [id number]) as of 08/04/2018 13:58  Ref. Range 08/04/2018 13:40  Scan Result Unknown 78    Assessment & Plan:    1. Nighttime Incontinence Continue nightly use of CPAP Continue to get tighter control of her DM Continue abstaining from fluid intake after 6 pm Continue Myrbetriq to 50 mg; refill given Discussed adding an anticholinergic but cautioned against due to age side effects and patient and daughter agrees Will pursue PTNS explained the PTNS provides treatment by indirectly providing electrical stimulation to the nerves responsible for bladder and pelvic floor function - a needle electrode generates an adjustable electrical pulse that travels to the sacral plexus via the tibial nerve which is located in the ankle, among other functions, the sacral nerve plexus regulates bladder and pelvic floor function - treatment protocol requires once-a-week treatments for 12 weeks, 30 minutes per session and many patients begin to see improvements by the 6th treatment. Patients who respond to treatment may require occasional treatments (~ once every 3 weeks) to sustain improvements. PTNS is a low-risk procedure. The most common side-effects with PTNS treatment are temporary and minor, resulting from the placement of the needle electrode. They include minor bleeding, mild pain and skin inflammation and patients have seen  up to an 80% success rate with this form of treatment RTC for PTNS   2. Nocturia See above  3. Urgency UA negative for infection   Return for schedule PTNS .  These notes generated with voice recognition software. I apologize for typographical errors.  Zara Council, PA-C  [facility] [address] [place], [place] [address] [phone]

## 2018-08-04 ENCOUNTER — Other Ambulatory Visit: Payer: Self-pay

## 2018-08-04 ENCOUNTER — Encounter: Payer: Self-pay | Admitting: Urology

## 2018-08-04 ENCOUNTER — Ambulatory Visit: Payer: Medicare HMO | Admitting: Urology

## 2018-08-04 ENCOUNTER — Telehealth: Payer: Self-pay | Admitting: Urology

## 2018-08-04 VITALS — BP 181/93 | HR 116 | Wt 205.5 lb

## 2018-08-04 DIAGNOSIS — N3944 Nocturnal enuresis: Secondary | ICD-10-CM | POA: Diagnosis not present

## 2018-08-04 DIAGNOSIS — R351 Nocturia: Secondary | ICD-10-CM

## 2018-08-04 DIAGNOSIS — R3915 Urgency of urination: Secondary | ICD-10-CM

## 2018-08-04 LAB — MICROSCOPIC EXAMINATION
Bacteria, UA: NONE SEEN
Epithelial Cells (non renal): >10 /hpf — AB (ref 0–10)
RBC: NONE SEEN /hpf (ref 0–2)

## 2018-08-04 LAB — BLADDER SCAN AMB NON-IMAGING: Scan Result: 78

## 2018-08-04 LAB — URINALYSIS, COMPLETE
Bilirubin, UA: NEGATIVE
Glucose, UA: NEGATIVE
Ketones, UA: NEGATIVE
Leukocytes,UA: NEGATIVE
Nitrite, UA: NEGATIVE
Protein,UA: NEGATIVE
RBC, UA: NEGATIVE
Specific Gravity, UA: 1.02 (ref 1.005–1.030)
Urobilinogen, Ur: 0.2 mg/dL (ref 0.2–1.0)
pH, UA: 7 (ref 5.0–7.5)

## 2018-08-04 MED ORDER — MIRABEGRON ER 50 MG PO TB24: 50.0000 mg | ORAL_TABLET | Freq: Every day | ORAL | 3 refills | Status: DC

## 2018-08-04 NOTE — Telephone Encounter (Signed)
Would you check with Mrs. Farnam insurance and see if it covers PTNS?

## 2018-08-04 NOTE — Telephone Encounter (Signed)
I will add her to my list

## 2018-08-05 ENCOUNTER — Other Ambulatory Visit: Payer: Self-pay

## 2018-08-05 ENCOUNTER — Telehealth: Payer: Self-pay | Admitting: Gastroenterology

## 2018-08-05 DIAGNOSIS — R1310 Dysphagia, unspecified: Secondary | ICD-10-CM

## 2018-08-05 NOTE — Telephone Encounter (Signed)
Called pt she has an upcoming apt for 08/20/18 for a 3 week f/u she states she was supposed to have an Upper GI  done does she need to have this first or keep f/u apt for 08/20/18 please advise

## 2018-08-06 NOTE — Telephone Encounter (Signed)
Spoke with pt and was able to schedule her endoscopy procedure.

## 2018-08-08 ENCOUNTER — Other Ambulatory Visit: Payer: Self-pay | Admitting: Orthopaedic Surgery

## 2018-08-08 DIAGNOSIS — M47817 Spondylosis without myelopathy or radiculopathy, lumbosacral region: Secondary | ICD-10-CM

## 2018-08-11 ENCOUNTER — Telehealth: Payer: Self-pay | Admitting: Gastroenterology

## 2018-08-11 NOTE — Telephone Encounter (Signed)
Patient called & wants to cancel her colonoscopy on 08-18-18. She would like to be r/s but can not do 09-01-2018 or 09-15-18

## 2018-08-12 NOTE — Telephone Encounter (Signed)
Spoke with pt and was able to reschedule her endoscopy procedure to 08-28-18. Pt is aware of when and where to go for her COVID test. [facility] has been notified.

## 2018-08-14 ENCOUNTER — Ambulatory Visit: Payer: Medicare HMO | Admitting: Gastroenterology

## 2018-08-14 NOTE — Telephone Encounter (Signed)
I would push it out for after her twelve PTNS treatments.

## 2018-08-14 NOTE — Telephone Encounter (Signed)
She does not need a PA for PTNS I have called her and scheduled her she starts Monday the 22nd She does have to pay a $30.00 copay for each PTNS and she has agreed to that.   She has a follow up with you in July do you want her to keep that or push it out?   Sharyn Lull

## 2018-08-18 ENCOUNTER — Other Ambulatory Visit: Payer: Self-pay

## 2018-08-18 ENCOUNTER — Ambulatory Visit: Payer: Medicare HMO | Admitting: Family Medicine

## 2018-08-18 DIAGNOSIS — R351 Nocturia: Secondary | ICD-10-CM | POA: Diagnosis not present

## 2018-08-18 NOTE — Progress Notes (Signed)
PTNS  Session # 1  Health & Social Factors: no change Caffeine:1 Alcohol: 0 Daytime voids #per day: 6 Night-time voids #per night: 0 Urgency: strong Incontinence Episodes #per day: 2 Ankle used: right Treatment Setting: 5 Feeling/ Response: sensory Comments: Patient tolerated well  Preformed By: Elberta Leatherwood, CMA  Follow Up: 1 week #2

## 2018-08-19 ENCOUNTER — Ambulatory Visit: Payer: Medicare HMO | Admitting: Gastroenterology

## 2018-08-20 ENCOUNTER — Encounter

## 2018-08-20 ENCOUNTER — Ambulatory Visit: Payer: Medicare HMO | Admitting: Gastroenterology

## 2018-08-25 ENCOUNTER — Other Ambulatory Visit
Admission: RE | Admit: 2018-08-25 | Discharge: 2018-08-25 | Disposition: A | Discharge status: Home / Self Care | Payer: Medicare HMO | Source: Ambulatory Visit | Attending: Gastroenterology | Admitting: Gastroenterology

## 2018-08-25 ENCOUNTER — Other Ambulatory Visit: Payer: Self-pay

## 2018-08-25 ENCOUNTER — Ambulatory Visit: Payer: Medicare HMO | Admitting: Family Medicine

## 2018-08-25 DIAGNOSIS — R351 Nocturia: Secondary | ICD-10-CM | POA: Diagnosis not present

## 2018-08-25 DIAGNOSIS — Z01812 Encounter for preprocedural laboratory examination: Secondary | ICD-10-CM | POA: Insufficient documentation

## 2018-08-25 DIAGNOSIS — Z1159 Encounter for screening for other viral diseases: Secondary | ICD-10-CM | POA: Diagnosis not present

## 2018-08-25 NOTE — Progress Notes (Signed)
PTNS  Session # 2  Health & Social Factors: no change Caffeine: 1 Alcohol: 0 Daytime voids #per day: 5 Night-time voids #per night: 2 Urgency: Mild Incontinence Episodes #per day: 2 Ankle used: left  Treatment Setting: 4 Feeling/ Response: Sensory Comments: Patient tolerated well  Preformed By: Elberta Leatherwood, CMA  Follow Up: 1 week #3

## 2018-08-26 LAB — NOVEL CORONAVIRUS, NAA (HOSP ORDER, SEND-OUT TO REF LAB; TAT 18-24 HRS): SARS-CoV-2, NAA: NOT DETECTED

## 2018-08-27 ENCOUNTER — Encounter: Payer: Self-pay | Admitting: Anesthesiology

## 2018-08-28 ENCOUNTER — Ambulatory Visit: Payer: Medicare HMO | Admitting: Anesthesiology

## 2018-08-28 ENCOUNTER — Encounter
Admission: RE | Disposition: A | Discharge status: Home / Self Care | Payer: Self-pay | Source: Home / Self Care | Attending: Gastroenterology

## 2018-08-28 ENCOUNTER — Encounter: Payer: Self-pay | Admitting: Gastroenterology

## 2018-08-28 ENCOUNTER — Ambulatory Visit
Admission: RE | Admit: 2018-08-28 | Discharge: 2018-08-28 | Disposition: A | Discharge status: Home / Self Care | Payer: Medicare HMO | Attending: Gastroenterology | Admitting: Gastroenterology

## 2018-08-28 ENCOUNTER — Other Ambulatory Visit: Payer: Self-pay

## 2018-08-28 DIAGNOSIS — K317 Polyp of stomach and duodenum: Secondary | ICD-10-CM

## 2018-08-28 DIAGNOSIS — Z79899 Other long term (current) drug therapy: Secondary | ICD-10-CM | POA: Diagnosis not present

## 2018-08-28 DIAGNOSIS — Z947 Corneal transplant status: Secondary | ICD-10-CM | POA: Diagnosis not present

## 2018-08-28 DIAGNOSIS — I10 Essential (primary) hypertension: Secondary | ICD-10-CM | POA: Insufficient documentation

## 2018-08-28 DIAGNOSIS — R1310 Dysphagia, unspecified: Secondary | ICD-10-CM | POA: Diagnosis not present

## 2018-08-28 DIAGNOSIS — Z791 Long term (current) use of non-steroidal anti-inflammatories (NSAID): Secondary | ICD-10-CM | POA: Diagnosis not present

## 2018-08-28 DIAGNOSIS — Z6839 Body mass index (BMI) 39.0-39.9, adult: Secondary | ICD-10-CM | POA: Insufficient documentation

## 2018-08-28 DIAGNOSIS — E785 Hyperlipidemia, unspecified: Secondary | ICD-10-CM | POA: Diagnosis not present

## 2018-08-28 DIAGNOSIS — F419 Anxiety disorder, unspecified: Secondary | ICD-10-CM | POA: Insufficient documentation

## 2018-08-28 DIAGNOSIS — Z794 Long term (current) use of insulin: Secondary | ICD-10-CM | POA: Diagnosis not present

## 2018-08-28 DIAGNOSIS — E119 Type 2 diabetes mellitus without complications: Secondary | ICD-10-CM | POA: Diagnosis not present

## 2018-08-28 DIAGNOSIS — K222 Esophageal obstruction: Secondary | ICD-10-CM | POA: Diagnosis not present

## 2018-08-28 DIAGNOSIS — M109 Gout, unspecified: Secondary | ICD-10-CM | POA: Diagnosis not present

## 2018-08-28 DIAGNOSIS — E669 Obesity, unspecified: Secondary | ICD-10-CM | POA: Insufficient documentation

## 2018-08-28 DIAGNOSIS — G4730 Sleep apnea, unspecified: Secondary | ICD-10-CM | POA: Insufficient documentation

## 2018-08-28 DIAGNOSIS — F329 Major depressive disorder, single episode, unspecified: Secondary | ICD-10-CM | POA: Diagnosis not present

## 2018-08-28 DIAGNOSIS — K219 Gastro-esophageal reflux disease without esophagitis: Secondary | ICD-10-CM | POA: Insufficient documentation

## 2018-08-28 HISTORY — PX: ESOPHAGOGASTRODUODENOSCOPY (EGD) WITH PROPOFOL: SHX5813

## 2018-08-28 LAB — KOH PREP: KOH Prep: NONE SEEN

## 2018-08-28 LAB — GLUCOSE, CAPILLARY: Glucose-Capillary: 168 mg/dL — ABNORMAL HIGH (ref 70–99)

## 2018-08-28 SURGERY — ESOPHAGOGASTRODUODENOSCOPY (EGD) WITH PROPOFOL
Anesthesia: General

## 2018-08-28 MED ORDER — FENTANYL CITRATE (PF) 100 MCG/2ML IJ SOLN
INTRAMUSCULAR | Status: DC | PRN
  Administered 2018-08-28 (×2): 50 ug via INTRAVENOUS

## 2018-08-28 MED ORDER — PROPOFOL 500 MG/50ML IV EMUL
INTRAVENOUS | Status: DC | PRN
  Administered 2018-08-28: 75 ug/kg/min via INTRAVENOUS

## 2018-08-28 MED ORDER — PROPOFOL 10 MG/ML IV BOLUS
INTRAVENOUS | Status: DC | PRN
  Administered 2018-08-28 (×2): 50 mg via INTRAVENOUS

## 2018-08-28 MED ORDER — SODIUM CHLORIDE 0.9 % IV SOLN
INTRAVENOUS | Status: DC
  Administered 2018-08-28: 1000 mL via INTRAVENOUS

## 2018-08-28 MED ORDER — LIDOCAINE HCL (CARDIAC) PF 100 MG/5ML IV SOSY
PREFILLED_SYRINGE | INTRAVENOUS | Status: DC | PRN
  Administered 2018-08-28: 80 mg via INTRAVENOUS

## 2018-08-28 NOTE — Anesthesia Post-op Follow-up Note (Signed)
Anesthesia QCDR form completed.        

## 2018-08-28 NOTE — H&P (Signed)
Jonathon Bellows, MD [address] [place], [place], [place], [address] [address], [place], [place], [place], [address] Phone: [phone]  Fax: [phone]  Primary Care Physician:  [name], MD   Pre-Procedure History & Physical: HPI:  Madeline Martinez is a 73 y.o. female is here for an endoscopy    Past Medical History:  Diagnosis Date  . Acid reflux   . Anxiety   . Depression   . Diabetes mellitus   . Gout   . HTN (hypertension)   . Hyperlipidemia   . Sleep apnea     Past Surgical History:  Procedure Laterality Date  . ABDOMINAL HYSTERECTOMY    . BACK SURGERY  2007   DISCS  . COLONOSCOPY WITH PROPOFOL N/A 12/19/2017   Procedure: COLONOSCOPY WITH PROPOFOL;  Surgeon: Jonathon Bellows, MD;  Location: [facility];  Service: Gastroenterology;  Laterality: N/A;  . 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 PARTIAL HYSTERECTOMY  1986    Prior to Admission medications   Medication Sig Start Date End Date Taking? Authorizing Provider  allopurinol (ZYLOPRIM) 100 MG tablet allopurinol 100 mg tablet 04/30/16  Yes [provider]  atorvastatin (LIPITOR) 80 MG tablet Take 80 mg by mouth daily.     Yes [provider]  Bisacodyl (DULCOLAX PO) Take 1 tablet by mouth daily as needed (constipation).    Yes [provider]  carvedilol (COREG) 25 MG tablet Take 25 mg by mouth 2 (two) times daily.     Yes [provider]  cetirizine (ZYRTEC) 10 MG tablet  04/20/17  Yes [provider]  Cholecalciferol (VITAMIN D PO) Take 1 tablet by mouth daily.   Yes [provider]  colchicine 0.6 MG tablet Take 0.6 mg by mouth 2 (two) times daily as needed (gout).    Yes [provider]  diltiazem (DILACOR XR) 180 MG 24 hr capsule Take 180 mg by mouth daily.     Yes [provider]  glyBURIDE (DIABETA) 5 MG tablet Take 10 mg by mouth 2 (two) times daily.     Yes [provider]  HYDROcodone-acetaminophen  (NORCO/VICODIN) 5-325 MG tablet TAKE 1 TABLET BY MOUTH EVERY DAY AS NEEDED FOR PAIN 08/11/18  Yes [provider]  linaclotide (LINZESS) 290 MCG CAPS capsule Take 1 capsule (290 mcg total) by mouth daily before breakfast. 07/30/18  Yes Jonathon Bellows, MD  LORazepam (ATIVAN) 1 MG tablet  08/17/18  Yes [provider]  losartan (COZAAR) 100 MG tablet Take 100 mg by mouth daily.   02/14/10  Yes [provider]  MELATONIN PO Take 1 tablet by mouth at bedtime.   Yes [provider]  metFORMIN (GLUCOPHAGE) 1000 MG tablet Take 1,000 mg by mouth 2 (two) times daily.     Yes [provider]  methocarbamol (ROBAXIN) 750 MG tablet methocarbamol 750 mg tablet 05/10/16  Yes [provider]  mirabegron ER (MYRBETRIQ) 50 MG TB24 tablet Take 1 tablet (50 mg total) by mouth daily. 08/04/18  Yes McGowan, Larene Beach A, PA-C  mirtazapine (REMERON) 15 MG tablet mirtazapine 15 mg tablet 04/29/16  Yes [provider]  Multiple Vitamin (MULTIVITAMIN WITH MINERALS) TABS tablet Take 1 tablet by mouth daily.   Yes [provider]  nabumetone (RELAFEN) 750 MG tablet Take 750 mg by mouth daily.    Yes [provider]  OVER THE COUNTER MEDICATION Place 1 drop into both eyes daily as needed (dry eyes). Eye  drops   Yes [provider]  Probiotic Product (PROBIOTIC PO) Take 1 tablet by mouth daily.    Yes [provider]  saxagliptin HCl (ONGLYZA) 5 MG TABS tablet Onglyza 5 mg tablet   Yes [provider]  TOUJEO SOLOSTAR 300 UNIT/ML SOPN  09/18/17  Yes [provider]    Allergies as of 08/06/2018  . (No Known Allergies)    Family History  Problem Relation Age of Onset  . Heart failure Father   . Breast cancer Mother   . Other Sister        DEGEN.DISC DISEASE  . Kidney cancer Neg Hx   . Kidney disease Neg Hx   . Prostate cancer Neg Hx     Social History   Socioeconomic History  . Marital status: Single    Spouse  name: Not on file  . Number of children: Not on file  . Years of education: Not on file  . Highest education level: Not on file  Occupational History  . Not on file  Social Needs  . Financial resource strain: Not on file  . Food insecurity    Worry: Not on file    Inability: Not on file  . Transportation needs    Medical: Not on file    Non-medical: Not on file  Tobacco Use  . Smoking status: Never Smoker  . Smokeless tobacco: Never Used  Substance and Sexual Activity  . Alcohol use: No  . Drug use: No  . Sexual activity: Not on file    Comment: HYST  Lifestyle  . Physical activity    Days per week: Not on file    Minutes per session: Not on file  . Stress: Not on file  Relationships  . Social Herbalist on phone: Not on file    Gets together: Not on file    Attends religious service: Not on file    Active member of club or organization: Not on file    Attends meetings of clubs or organizations: Not on file    Relationship status: Not on file  . Intimate partner violence    Fear of current or ex partner: Not on file    Emotionally abused: Not on file    Physically abused: Not on file    Forced sexual activity: Not on file  Other Topics Concern  . Not on file  Social History Narrative  . Not on file    Review of Systems: See HPI, otherwise negative ROS  Physical Exam: BP 138/76   Pulse 91   Temp (!) 97.3 F (36.3 C) (Tympanic)   Resp 20   Ht 5' (1.524 m)   Wt 90.7 kg   SpO2 98%   BMI 39.06 kg/m  General:   Alert,  pleasant and cooperative in NAD Head:  Normocephalic and atraumatic. Neck:  Supple; no masses or thyromegaly. Lungs:  Clear throughout to auscultation, normal respiratory effort.    Heart:  +S1, +S2, Regular rate and rhythm, No edema. Abdomen:  Soft, nontender and nondistended. Normal bowel sounds, without guarding, and without rebound.   Neurologic:  Alert and  oriented x4;  grossly normal neurologically.  Impression/Plan:  Madeline Martinez is here for an endoscopy  to be performed for  evaluation of dysphagia    Risks, benefits, limitations, and alternatives regarding endoscopy and dilation have been reviewed with the patient.  Questions have been answered.  All parties agreeable.   Jonathon Bellows, MD  08/28/2018, 8:43 AM

## 2018-08-28 NOTE — Transfer of Care (Signed)
Immediate Anesthesia Transfer of Care Note  Patient: Madeline Martinez  Procedure(s) Performed: ESOPHAGOGASTRODUODENOSCOPY (EGD) WITH PROPOFOL (N/A )  Patient Location: PACU  Anesthesia Type:General  Level of Consciousness: awake and alert   Airway & Oxygen Therapy: Patient Spontanous Breathing and Patient connected to nasal cannula oxygen  Post-op Assessment: Report given to RN and Post -op Vital signs reviewed and stable  Post vital signs: Reviewed and stable  Last Vitals:  Vitals Value Taken Time  BP    Temp    Pulse 90 08/28/18 0915  Resp 8 08/28/18 0915  SpO2 99 % 08/28/18 0915  Vitals shown include unvalidated device data.  Last Pain:  Vitals:   08/28/18 0754  TempSrc: Tympanic  PainSc: 0-No pain         Complications: No apparent anesthesia complications

## 2018-08-28 NOTE — Anesthesia Postprocedure Evaluation (Signed)
Anesthesia Post Note  Patient: Madeline Martinez  Procedure(s) Performed: ESOPHAGOGASTRODUODENOSCOPY (EGD) WITH PROPOFOL (N/A )  Patient location during evaluation: Endoscopy Anesthesia Type: General Level of consciousness: awake and alert Pain management: pain level controlled Vital Signs Assessment: post-procedure vital signs reviewed and stable Respiratory status: spontaneous breathing, nonlabored ventilation, respiratory function stable and patient connected to nasal cannula oxygen Cardiovascular status: blood pressure returned to baseline and stable Postop Assessment: no apparent nausea or vomiting Anesthetic complications: no     Last Vitals:  Vitals:   08/28/18 0913 08/28/18 0916  BP: (!) 180/122 (!) 165/123  Pulse:    Resp:    Temp:  (!) 36.1 C  SpO2:      Last Pain:  Vitals:   08/28/18 0916  TempSrc: Tympanic  PainSc:                  Hines Kloss S

## 2018-08-28 NOTE — Op Note (Signed)
[facility] Gastroenterology Patient Name: Madeline Martinez Procedure Date: 08/28/2018 8:41 AM MRN: [id number] Account #: [id number] Date of Birth: 1945/07/05 Admit Type: Outpatient Age: 73 Room: [facility] ENDO ROOM 3 Gender: Female Note Status: Finalized Procedure:            Upper GI endoscopy Indications:          Dysphagia Providers:            Jonathon Bellows MD, MD Medicines:            Monitored Anesthesia Care Complications:        No immediate complications. Procedure:            Pre-Anesthesia Assessment:                       - Prior to the procedure, a History and Physical was                        performed, and patient medications, allergies and                        sensitivities were reviewed. The patient's tolerance of                        previous anesthesia was reviewed.                       - The risks and benefits of the procedure and the                        sedation options and risks were discussed with the                        patient. All questions were answered and informed                        consent was obtained.                       - ASA Grade Assessment: III - A patient with severe                        systemic disease.                       After obtaining informed consent, the endoscope was                        passed under direct vision. Throughout the procedure,                        the patient's blood pressure, pulse, and oxygen                        saturations were monitored continuously. The Endoscope                        was introduced through the mouth, and advanced to the                        third part of duodenum. The upper GI endoscopy was  accomplished with ease. The patient tolerated the                        procedure well. Findings:      The examined duodenum was normal.      A single 15 mm sessile polyp with no bleeding and no stigmata of recent       bleeding was found on the  lesser curvature of the stomach. Biopsies were       taken with a cold forceps for histology.      A single 6 mm sessile polyp with no bleeding and no stigmata of recent       bleeding was found in the cardia. The polyp was removed with a cold       biopsy forceps. Resection and retrieval were complete.      The cardia and gastric fundus were normal on retroflexion.      One benign-appearing, intrinsic mild stenosis was found at the       gastroesophageal junction. This stenosis measured 1.4 cm (inner       diameter) x less than one cm (in length). The stenosis was traversed. A       TTS dilator was passed through the scope. Dilation with a 15-16.5-18 mm       balloon dilator was performed to 18 mm. The dilation site was examined       and showed complete resolution of luminal narrowing.      Normal mucosa was found in the middle third of the esophagus and in the       lower third of the esophagus. Biopsies were taken with a cold forceps       for histology.      Patchy, white plaques were found in the upper third of the esophagus.       Cells for cytology were obtained by brushing. Impression:           - Normal examined duodenum.                       - A single gastric polyp. Biopsied.                       - A single gastric polyp. Resected and retrieved.                       - Benign-appearing esophageal stenosis. Dilated.                       - Normal mucosa was found in the middle third of the                        esophagus and in the lower third of the esophagus.                        Biopsied.                       - Esophageal plaques were found, suspicious for                        candidiasis. Cells for cytology obtained. Recommendation:       - Discharge patient to home (with escort).                       -  Resume previous diet.                       - Continue present medications.                       - Await pathology results.                       - Perform an  upper endoscopic ultrasound (UEUS) in 4                        weeks.                       - Return to my office in 2 weeks. Procedure Code(s):    --- Professional ---                       [phone], Esophagogastroduodenoscopy, flexible, transoral;                        with transendoscopic balloon dilation of esophagus                        (less than 30 mm diameter)                       43239, 59, Esophagogastroduodenoscopy, flexible,                        transoral; with biopsy, single or multiple Diagnosis Code(s):    --- Professional ---                       K31.7, Polyp of stomach and duodenum                       K22.2, Esophageal obstruction                       K22.9, Disease of esophagus, unspecified                       R13.10, Dysphagia, unspecified CPT copyright 2019 American Medical Association. All rights reserved. The codes documented in this report are preliminary and upon coder review may  be revised to meet current compliance requirements. Jonathon Bellows, MD Jonathon Bellows MD, MD 08/28/2018 9:09:39 AM This report has been signed electronically. Number of Addenda: 0 Note Initiated On: 08/28/2018 8:41 AM Estimated Blood Loss: Estimated blood loss: none.      [facility]

## 2018-08-28 NOTE — Anesthesia Preprocedure Evaluation (Addendum)
Anesthesia Evaluation  Patient identified by MRN, date of birth, ID band Patient awake    Reviewed: Allergy & Precautions, NPO status , Patient's Chart, lab work & pertinent test results, reviewed documented beta blocker date and time   Airway Mallampati: III  TM Distance: >3 FB     Dental  (+) Upper Dentures, Lower Dentures   Pulmonary sleep apnea and Continuous Positive Airway Pressure Ventilation ,           Cardiovascular hypertension, Pt. on medications and Pt. on home beta blockers      Neuro/Psych PSYCHIATRIC DISORDERS Anxiety Depression    GI/Hepatic GERD  ,  Endo/Other  diabetes, Type 2  Renal/GU      Musculoskeletal   Abdominal   Peds  Hematology   Anesthesia Other Findings Gout. Corneal transplant. Obese.  Reproductive/Obstetrics                            Anesthesia Physical Anesthesia Plan  ASA: III  Anesthesia Plan: General   Post-op Pain Management:    Induction: Intravenous  PONV Risk Score and Plan:   Airway Management Planned:   Additional Equipment:   Intra-op Plan:   Post-operative Plan:   Informed Consent: I have reviewed the patients History and Physical, chart, labs and discussed the procedure including the risks, benefits and alternatives for the proposed anesthesia with the patient or authorized representative who has indicated his/her understanding and acceptance.       Plan Discussed with: CRNA  Anesthesia Plan Comments:         Anesthesia Quick Evaluation

## 2018-09-01 ENCOUNTER — Other Ambulatory Visit: Payer: Self-pay

## 2018-09-01 ENCOUNTER — Ambulatory Visit: Payer: Medicare HMO | Admitting: Family Medicine

## 2018-09-01 ENCOUNTER — Ambulatory Visit
Admission: RE | Admit: 2018-09-01 | Discharge: 2018-09-01 | Disposition: A | Discharge status: Home / Self Care | Payer: Medicare HMO | Source: Ambulatory Visit | Attending: Orthopaedic Surgery | Admitting: Orthopaedic Surgery

## 2018-09-01 DIAGNOSIS — M47817 Spondylosis without myelopathy or radiculopathy, lumbosacral region: Secondary | ICD-10-CM

## 2018-09-01 DIAGNOSIS — R351 Nocturia: Secondary | ICD-10-CM | POA: Diagnosis not present

## 2018-09-01 MED ORDER — GADOBENATE DIMEGLUMINE 529 MG/ML IV SOLN
18.0000 mL | Freq: Once | INTRAVENOUS | Status: AC | PRN
  Administered 2018-09-01: 18 mL via INTRAVENOUS

## 2018-09-01 NOTE — Progress Notes (Signed)
PTNS  Session # 3  Health & Social Factors: no change Caffeine: 1 Alcohol: 0 Daytime voids #per day: 6 Night-time voids #per night: 1 Urgency: mild Incontinence Episodes #per day: 2 during the night Ankle used: right Treatment Setting: 3 Feeling/ Response: sensory Comments: Patient tolerated well  Preformed By: Elberta Leatherwood, CMA  Follow Up: 1 week #4

## 2018-09-02 ENCOUNTER — Other Ambulatory Visit: Payer: Self-pay

## 2018-09-02 DIAGNOSIS — K229 Disease of esophagus, unspecified: Secondary | ICD-10-CM

## 2018-09-02 DIAGNOSIS — K317 Polyp of stomach and duodenum: Secondary | ICD-10-CM

## 2018-09-02 DIAGNOSIS — K222 Esophageal obstruction: Secondary | ICD-10-CM

## 2018-09-02 LAB — SURGICAL PATHOLOGY

## 2018-09-04 ENCOUNTER — Telehealth: Payer: Self-pay | Admitting: Gastroenterology

## 2018-09-04 NOTE — Telephone Encounter (Signed)
There is a referral for this pt to schedule an EUS.

## 2018-09-04 NOTE — Telephone Encounter (Signed)
Hi   The biopsy returned as a hyperplastic polyp- since > 10 mm in size would need EMR, Was sessile in appearance.   Regards  Cailee Blanke

## 2018-09-04 NOTE — Telephone Encounter (Signed)
Looking at the EGD, looks like evaluation of the gastric polyp/nodule to ensure there is not a submucosal lesion is reasonable via EUS. Dr. Vicente Males, were you wanting [place] to consider resection of this reported hyperplastic polyp or just to ensure there is not something more to the nodularity? If just for EUS and possible FNA/FNB then Dan or I can be available for the procedure. If for EUS and possible EMR, then I would need to have it scheduled in my block. Please let us know your thoughts. Thanks for the referral.  GM

## 2018-09-04 NOTE — Telephone Encounter (Signed)
Dr Jacobs and Mansouraty please review for EUS  

## 2018-09-05 NOTE — Telephone Encounter (Signed)
Dr Vicente Males, Thanks for the update. Sheri, please move forward with scheduling this as an EGD/EUS with EMR with me. Please schedule a visit with me to discuss EMR before the EUS date. Thanks. GM

## 2018-09-05 NOTE — Telephone Encounter (Signed)
Left message for patient to call back  

## 2018-09-08 ENCOUNTER — Other Ambulatory Visit: Payer: Self-pay

## 2018-09-08 ENCOUNTER — Ambulatory Visit: Payer: Medicare HMO | Admitting: Family Medicine

## 2018-09-08 DIAGNOSIS — R351 Nocturia: Secondary | ICD-10-CM

## 2018-09-08 DIAGNOSIS — K317 Polyp of stomach and duodenum: Secondary | ICD-10-CM

## 2018-09-08 NOTE — Telephone Encounter (Signed)
appt made on 8/12 with Dr Jerilynn Mages at [facility] testing also scheduled.  The pt has been advised and instructions mailed.

## 2018-09-08 NOTE — Progress Notes (Signed)
PTNS  Session # 4  Health & Social Factors: no change Caffeine: 1 Alcohol: 0 Daytime voids #per day: 6 Night-time voids #per night: 1 Urgency: mild Incontinence Episodes #per day: 6 Ankle used: right Treatment Setting: 7 Feeling/ Response: sensory Comments: Patient tolerated well  Preformed By: Elberta Leatherwood, CMA  Follow Up: 1 week #5

## 2018-09-11 ENCOUNTER — Encounter: Payer: Self-pay | Admitting: Gastroenterology

## 2018-09-11 ENCOUNTER — Other Ambulatory Visit: Payer: Self-pay

## 2018-09-11 ENCOUNTER — Ambulatory Visit: Payer: Medicare HMO | Admitting: Gastroenterology

## 2018-09-11 VITALS — BP 147/78 | HR 101 | Temp 98.4°F | Ht 61.0 in | Wt 206.0 lb

## 2018-09-11 DIAGNOSIS — R1310 Dysphagia, unspecified: Secondary | ICD-10-CM

## 2018-09-11 DIAGNOSIS — K317 Polyp of stomach and duodenum: Secondary | ICD-10-CM

## 2018-09-11 NOTE — Progress Notes (Signed)
Jonathon Bellows MD, [name] [address]  [place]  [place], [place] [address]  Main: [phone]  Fax: [phone]   Primary Care Physician: [name], MD  Primary Gastroenterologist:  Dr. Jonathon Bellows   Follow-up for dysphagia  HPI: Madeline Martinez is a 73 y.o. female   Summary of history :  Initially referred and see 06/19/2018 for dysphagia. On going for over a month, every day , usually for meats but not liquids. No reflux like symptoms. Gained weight recently. Occasionally took Prilosec. 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  A polyp was seen in the greater curvature the stomach close to the pylorus sessile in nature about 15 mm in size biopsy suggested a hyperplastic polyp.  No issues with constipation, no issues with dysphagia, on her PPI which he takes regularly. Current Outpatient Medications  Medication Sig Dispense Refill   allopurinol (ZYLOPRIM) 100 MG tablet allopurinol 100 mg tablet     atorvastatin (LIPITOR) 80 MG tablet Take 80 mg by mouth daily.       Bisacodyl (DULCOLAX PO) Take 1 tablet by mouth daily as needed (constipation).      carvedilol (COREG) 25 MG tablet Take 25 mg by mouth 2 (two) times daily.       cetirizine (ZYRTEC) 10 MG tablet      Cholecalciferol (VITAMIN D PO) Take 1 tablet by mouth daily.     colchicine 0.6 MG tablet Take 0.6 mg by mouth 2 (two) times daily as needed (gout).      diltiazem (DILACOR XR) 180 MG 24 hr capsule Take 180 mg by mouth daily.       glyBURIDE (DIABETA) 5 MG tablet Take 10 mg by mouth 2 (two) times daily.       HYDROcodone-acetaminophen (NORCO/VICODIN) 5-325 MG tablet TAKE 1 TABLET BY MOUTH EVERY DAY AS NEEDED FOR  PAIN     linaclotide (LINZESS) 290 MCG CAPS capsule Take 1 capsule (290 mcg total) by mouth daily before breakfast. 90 capsule 1   LORazepam (ATIVAN) 1 MG tablet      losartan (COZAAR) 100 MG tablet Take 100 mg by mouth daily.       MELATONIN PO Take 1 tablet by mouth at bedtime.     metFORMIN (GLUCOPHAGE) 1000 MG tablet Take 1,000 mg by mouth 2 (two) times daily.       methocarbamol (ROBAXIN) 750 MG tablet methocarbamol 750 mg tablet     mirabegron ER (MYRBETRIQ) 50 MG TB24 tablet Take 1 tablet (50 mg total) by mouth daily. 90 tablet 3   mirtazapine (REMERON) 15 MG tablet mirtazapine 15 mg tablet     Multiple Vitamin (MULTIVITAMIN WITH MINERALS) TABS tablet Take 1 tablet by mouth daily.     nabumetone (RELAFEN) 750 MG tablet Take 750 mg by mouth daily.      OVER THE COUNTER MEDICATION Place 1 drop into both eyes daily as needed (dry eyes). Eye drops     Probiotic Product (PROBIOTIC PO) Take 1 tablet by mouth daily.      saxagliptin HCl (ONGLYZA) 5 MG TABS tablet Onglyza 5 mg tablet     TOUJEO SOLOSTAR 300 UNIT/ML SOPN  No current facility-administered medications for this visit.     Allergies as of 09/11/2018   (No Known Allergies)    ROS:  General: Negative for anorexia, weight loss, fever, chills, fatigue, weakness. ENT: Negative for hoarseness, difficulty swallowing , nasal congestion. CV: Negative for chest pain, angina, palpitations, dyspnea on exertion, peripheral edema.  Respiratory: Negative for dyspnea at rest, dyspnea on exertion, cough, sputum, wheezing.  GI: See history of present illness. GU:  Negative for dysuria, hematuria, urinary incontinence, urinary frequency, nocturnal urination.  Endo: Negative for unusual weight change.    Physical Examination:   There were no vitals taken for this visit.  General: Well-nourished, well-developed in no acute distress.  Eyes: No icterus. Conjunctivae pink. Mouth: Oropharyngeal mucosa moist and pink , no  lesions erythema or exudate. Lungs: Clear to auscultation bilaterally. Non-labored. Heart: Regular rate and rhythm, no murmurs rubs or gallops.  Abdomen: Bowel sounds are normal, nontender, nondistended, no hepatosplenomegaly or masses, no abdominal bruits or hernia , no rebound or guarding.   Extremities: No lower extremity edema. No clubbing or deformities. Neuro: Alert and oriented x 3.  Grossly intact. Skin: Warm and dry, no jaundice.   Psych: Alert and cooperative, normal mood and affect.   Imaging Studies: Mr Lumbar Spine W Wo Contrast  Result Date: 09/01/2018 CLINICAL DATA:  Chronic low back pain radiating to the right leg. History of lumbar surgery 09/14/2005. Creatinine was obtained on site at [facility] at [address] Results: Creatinine 1.2 mg/dL. EXAM: MRI LUMBAR SPINE WITHOUT AND WITH CONTRAST TECHNIQUE: Multiplanar and multiecho pulse sequences of the lumbar spine were obtained without and with intravenous contrast. CONTRAST:  18 mL MULTIHANCE GADOBENATE DIMEGLUMINE 529 MG/ML IV SOLN COMPARISON:  MRI lumbar spine 09/06/2005. FINDINGS: Segmentation:  Standard. Alignment: Trace facet mediated anterolisthesis L5 on S1 is identified. Vertebrae: No fracture or worrisome lesion. Degenerative endplate signal change W0-9 noted. Conus medullaris and cauda equina: Conus extends to the T12-L1 level. Conus and cauda equina appear normal. Paraspinal and other soft tissues: Large right renal cyst noted. Disc levels: T10-11 and T11-12 are imaged in the sagittal plane only. There is loss of disc space height at both levels and central disc protrusions. Both disc protrusions appear to contact the distal cord. Facet arthropathy and ligamentum flavum thickening are identified. Moderate to moderately severe bilateral foraminal narrowing is present at both levels. Spondylosis has worsened at both levels since the prior MRI. T12-L1: Mild facet degenerative change.  Otherwise negative. L1-2:  Mild-to-moderate facet degenerative disease, worse on the right. This level is otherwise negative. L2-3: Moderate to moderately severe facet arthropathy is worse on the right. There is a facet joint effusion on the right which is new since the prior exam. Ligamentum flavum thickening and a shallow disc bulge are identified. There is mild central canal and bilateral foraminal narrowing. L3-4: Broad-based disc bulge, facet degenerative disease and ligamentum flavum thickening. Moderate central canal stenosis has progressed since the prior MRI. Mild bilateral foraminal narrowing also appears worse. L4-5: The patient has undergone discectomy at this level. Shallow central protrusion and endplate spur are identified. The central canal is open. Mild bilateral foraminal narrowing is present. L5-S1: The disc is uncovered with a broad-based bulge. More focal disc protrusion in the left foramen with cephalad extension is identified. Facet arthropathy and ligamentum flavum thickening are present. There is moderately severe central canal stenosis and narrowing of both subarticular recesses which could impact the S1 roots. Moderate to moderately severe bilateral foraminal  narrowing is present. IMPRESSION: Spondylosis appears worst at L5-S1 where it has progressed since the prior examination. Moderate to moderately severe central canal stenosis is present at L5-S1 with narrowing of both subarticular recesses which could impact either S1 root. Moderate to moderately severe bilateral foraminal narrowing is also present this level. Worsened spondylosis at L3-4 where there is moderate central canal narrowing and mild bilateral foraminal narrowing. Progressive facet arthropathy at L2-3 is more notable on the right where there is a facet joint effusion. Mild central canal and bilateral foraminal narrowing are seen at this level. Postoperative change at L4-5. Shallow central protrusion without central canal stenosis is identified.  Spondylosis at T10-11 and T11-12 has worsened since the prior MRI. These levels are imaged in the sagittal plane but disc appears to contact the cord and there is bilateral foraminal narrowing at both levels. Electronically Signed   By: Inge Rise M.D.   On: 09/01/2018 11:32    Assessment and Plan:   Madeline Martinez is a 73 y.o. y/o femalehere to follow up for dysphagia,  status post dilation of the GE junction stricture since has resolved.  Incidentally found to have a polyp in the stomach close to the antrum hyperplastic in nature on sample biopsy.  It was about 15 mm in size and not resected.  Plan  1. She has been referred to Dr. Rush Landmark who is planning an EMR of the gastric polyp soon  2.  Dysphagia: Continue PPI has resolved  3.  Constipation.  Continue Linzess.  Dr Jonathon Bellows  MD,MRCP [facility]) Follow up in as needed

## 2018-09-15 ENCOUNTER — Ambulatory Visit: Payer: Medicare HMO

## 2018-09-17 ENCOUNTER — Ambulatory Visit: Payer: Medicare HMO

## 2018-09-17 ENCOUNTER — Other Ambulatory Visit: Payer: Self-pay

## 2018-09-17 DIAGNOSIS — R351 Nocturia: Secondary | ICD-10-CM

## 2018-09-17 NOTE — Progress Notes (Signed)
PTNS  Session # 5  Health & Social Factors: Pt states she was able to go 2 nights without wearing a pad   Caffeine: 1 Alcohol: 1 Daytime voids #per day: 6 Night-time voids #per night: 1-2 Urgency: Strong Incontinence Episodes #per day: 6 Ankle used: Right Treatment Setting: 6 Feeling/ Response: Sensory Comments: N/A  Preformed By: Gordy Clement, CMA (AAMA)  Follow Up: RTC in one week as scheduled

## 2018-09-22 ENCOUNTER — Ambulatory Visit: Payer: Medicare HMO | Admitting: Family Medicine

## 2018-09-22 ENCOUNTER — Other Ambulatory Visit: Payer: Self-pay

## 2018-09-22 DIAGNOSIS — R351 Nocturia: Secondary | ICD-10-CM | POA: Diagnosis not present

## 2018-09-22 NOTE — Progress Notes (Signed)
PTNS  Session # 6  Health & Social Factors: No change Caffeine: 1 Alcohol: 0 Daytime voids #per day: 4 Night-time voids #per night: 1 Urgency: mild Incontinence Episodes #per day: 0-1 Ankle used: left Treatment Setting: 2 Feeling/ Response: sensory Comments: Patient tolerated well  Preformed By: Elberta Leatherwood, CMA  Follow Up: 1 week # 7

## 2018-09-25 ENCOUNTER — Ambulatory Visit: Payer: Self-pay | Admitting: Urology

## 2018-09-29 ENCOUNTER — Other Ambulatory Visit: Payer: Self-pay

## 2018-09-29 ENCOUNTER — Ambulatory Visit (INDEPENDENT_AMBULATORY_CARE_PROVIDER_SITE_OTHER): Payer: Medicare HMO

## 2018-09-29 DIAGNOSIS — R351 Nocturia: Secondary | ICD-10-CM | POA: Diagnosis not present

## 2018-09-29 NOTE — Progress Notes (Signed)
PTNS  Session # 7  Health & Social Factors: no change Caffeine: 1 Alcohol: 0 Daytime voids #per day: 4 Night-time voids #per night: 1 Urgency: mild Incontinence Episodes #per day: 0-1 Ankle used: right Treatment Setting: 5 Feeling/ Response: sensory Comments: patient tolerated well  Preformed By: Shawnie Dapper, CMA  Follow Up: 1 week

## 2018-10-04 ENCOUNTER — Other Ambulatory Visit (HOSPITAL_COMMUNITY)
Admission: RE | Admit: 2018-10-04 | Discharge: 2018-10-04 | Disposition: A | Discharge status: Home / Self Care | Payer: Medicare HMO | Source: Ambulatory Visit | Attending: Gastroenterology | Admitting: Gastroenterology

## 2018-10-04 DIAGNOSIS — Z01818 Encounter for other preprocedural examination: Secondary | ICD-10-CM | POA: Diagnosis present

## 2018-10-04 DIAGNOSIS — Z20828 Contact with and (suspected) exposure to other viral communicable diseases: Secondary | ICD-10-CM | POA: Insufficient documentation

## 2018-10-05 LAB — SARS CORONAVIRUS 2 (TAT 6-24 HRS): SARS Coronavirus 2: NEGATIVE

## 2018-10-06 ENCOUNTER — Other Ambulatory Visit: Payer: Self-pay

## 2018-10-06 ENCOUNTER — Ambulatory Visit (INDEPENDENT_AMBULATORY_CARE_PROVIDER_SITE_OTHER): Payer: Medicare HMO | Admitting: Family Medicine

## 2018-10-06 DIAGNOSIS — R351 Nocturia: Secondary | ICD-10-CM

## 2018-10-06 NOTE — Progress Notes (Signed)
PTNS  Session # 8  Health & Social Factors: no change Caffeine: 1 Alcohol: 0 Daytime voids #per day: 4 Night-time voids #per night: 1 Urgency: mild Incontinence Episodes #per day: 1 Ankle used: right Treatment Setting: 6 Feeling/ Response: sensory Comments: Patient tolerated well  Preformed By: Elberta Leatherwood, CMA  Follow Up: 1 week #9

## 2018-10-07 ENCOUNTER — Encounter (HOSPITAL_COMMUNITY): Payer: Self-pay | Admitting: *Deleted

## 2018-10-07 ENCOUNTER — Other Ambulatory Visit: Payer: Self-pay

## 2018-10-08 ENCOUNTER — Ambulatory Visit (HOSPITAL_COMMUNITY): Payer: Medicare HMO | Admitting: Anesthesiology

## 2018-10-08 ENCOUNTER — Other Ambulatory Visit: Payer: Self-pay

## 2018-10-08 ENCOUNTER — Encounter (HOSPITAL_COMMUNITY)
Admission: RE | Disposition: A | Discharge status: Home / Self Care | Payer: Self-pay | Source: Home / Self Care | Attending: Gastroenterology

## 2018-10-08 ENCOUNTER — Encounter (HOSPITAL_COMMUNITY): Payer: Self-pay | Admitting: *Deleted

## 2018-10-08 ENCOUNTER — Ambulatory Visit (HOSPITAL_COMMUNITY)
Admission: RE | Admit: 2018-10-08 | Discharge: 2018-10-08 | Disposition: A | Discharge status: Home / Self Care | Payer: Medicare HMO | Attending: Gastroenterology | Admitting: Gastroenterology

## 2018-10-08 DIAGNOSIS — E119 Type 2 diabetes mellitus without complications: Secondary | ICD-10-CM | POA: Insufficient documentation

## 2018-10-08 DIAGNOSIS — K319 Disease of stomach and duodenum, unspecified: Secondary | ICD-10-CM | POA: Diagnosis present

## 2018-10-08 DIAGNOSIS — R011 Cardiac murmur, unspecified: Secondary | ICD-10-CM | POA: Diagnosis not present

## 2018-10-08 DIAGNOSIS — E785 Hyperlipidemia, unspecified: Secondary | ICD-10-CM | POA: Diagnosis not present

## 2018-10-08 DIAGNOSIS — Z947 Corneal transplant status: Secondary | ICD-10-CM | POA: Insufficient documentation

## 2018-10-08 DIAGNOSIS — G4730 Sleep apnea, unspecified: Secondary | ICD-10-CM | POA: Diagnosis not present

## 2018-10-08 DIAGNOSIS — Z803 Family history of malignant neoplasm of breast: Secondary | ICD-10-CM | POA: Insufficient documentation

## 2018-10-08 DIAGNOSIS — M109 Gout, unspecified: Secondary | ICD-10-CM | POA: Diagnosis not present

## 2018-10-08 DIAGNOSIS — K219 Gastro-esophageal reflux disease without esophagitis: Secondary | ICD-10-CM | POA: Diagnosis not present

## 2018-10-08 DIAGNOSIS — F419 Anxiety disorder, unspecified: Secondary | ICD-10-CM | POA: Insufficient documentation

## 2018-10-08 DIAGNOSIS — Z8249 Family history of ischemic heart disease and other diseases of the circulatory system: Secondary | ICD-10-CM | POA: Diagnosis not present

## 2018-10-08 DIAGNOSIS — I10 Essential (primary) hypertension: Secondary | ICD-10-CM | POA: Diagnosis not present

## 2018-10-08 DIAGNOSIS — K228 Other specified diseases of esophagus: Secondary | ICD-10-CM

## 2018-10-08 DIAGNOSIS — F329 Major depressive disorder, single episode, unspecified: Secondary | ICD-10-CM | POA: Diagnosis not present

## 2018-10-08 DIAGNOSIS — Z8261 Family history of arthritis: Secondary | ICD-10-CM | POA: Insufficient documentation

## 2018-10-08 DIAGNOSIS — I499 Cardiac arrhythmia, unspecified: Secondary | ICD-10-CM | POA: Diagnosis not present

## 2018-10-08 DIAGNOSIS — Z90710 Acquired absence of both cervix and uterus: Secondary | ICD-10-CM | POA: Diagnosis not present

## 2018-10-08 DIAGNOSIS — K3189 Other diseases of stomach and duodenum: Secondary | ICD-10-CM | POA: Diagnosis not present

## 2018-10-08 DIAGNOSIS — K317 Polyp of stomach and duodenum: Secondary | ICD-10-CM | POA: Diagnosis not present

## 2018-10-08 DIAGNOSIS — I899 Noninfective disorder of lymphatic vessels and lymph nodes, unspecified: Secondary | ICD-10-CM | POA: Diagnosis not present

## 2018-10-08 HISTORY — PX: ESOPHAGOGASTRODUODENOSCOPY (EGD) WITH PROPOFOL: SHX5813

## 2018-10-08 HISTORY — DX: Cardiac murmur, unspecified: R01.1

## 2018-10-08 HISTORY — PX: POLYPECTOMY: SHX5525

## 2018-10-08 HISTORY — DX: Cardiac arrhythmia, unspecified: I49.9

## 2018-10-08 HISTORY — PX: EUS: SHX5427

## 2018-10-08 HISTORY — PX: ENDOSCOPIC MUCOSAL RESECTION: SHX6839

## 2018-10-08 HISTORY — PX: BIOPSY: SHX5522

## 2018-10-08 LAB — GLUCOSE, CAPILLARY: Glucose-Capillary: 279 mg/dL — ABNORMAL HIGH (ref 70–99)

## 2018-10-08 SURGERY — ESOPHAGOGASTRODUODENOSCOPY (EGD) WITH PROPOFOL
Anesthesia: General

## 2018-10-08 MED ORDER — ONDANSETRON HCL 4 MG/2ML IJ SOLN
INTRAMUSCULAR | Status: DC | PRN
  Administered 2018-10-08: 4 mg via INTRAVENOUS

## 2018-10-08 MED ORDER — OMEPRAZOLE 20 MG PO CPDR: 40.0000 mg | DELAYED_RELEASE_CAPSULE | Freq: Two times a day (BID) | ORAL | 3 refills | Status: DC

## 2018-10-08 MED ORDER — SODIUM CHLORIDE 0.9 % IV SOLN: INTRAVENOUS | Status: DC

## 2018-10-08 MED ORDER — FENTANYL CITRATE (PF) 100 MCG/2ML IJ SOLN
INTRAMUSCULAR | Status: DC | PRN
  Administered 2018-10-08 (×2): 50 ug via INTRAVENOUS

## 2018-10-08 MED ORDER — FENTANYL CITRATE (PF) 100 MCG/2ML IJ SOLN
INTRAMUSCULAR | Status: AC
  Filled 2018-10-08: qty 2

## 2018-10-08 MED ORDER — IODINE STRONG (LUGOLS) 5 % PO SOLN: ORAL | Status: DC | PRN

## 2018-10-08 MED ORDER — SUGAMMADEX SODIUM 500 MG/5ML IV SOLN
INTRAVENOUS | Status: DC | PRN
  Administered 2018-10-08: 300 mg via INTRAVENOUS

## 2018-10-08 MED ORDER — PROPOFOL 10 MG/ML IV BOLUS
INTRAVENOUS | Status: DC | PRN
  Administered 2018-10-08: 150 mg via INTRAVENOUS

## 2018-10-08 MED ORDER — ROCURONIUM BROMIDE 10 MG/ML (PF) SYRINGE
PREFILLED_SYRINGE | INTRAVENOUS | Status: DC | PRN
  Administered 2018-10-08: 50 mg via INTRAVENOUS
  Administered 2018-10-08: 20 mg via INTRAVENOUS

## 2018-10-08 MED ORDER — LIDOCAINE 2% (20 MG/ML) 5 ML SYRINGE
INTRAMUSCULAR | Status: DC | PRN
  Administered 2018-10-08: 50 mg via INTRAVENOUS

## 2018-10-08 MED ORDER — LACTATED RINGERS IV SOLN
INTRAVENOUS | Status: DC
  Administered 2018-10-08: 1000 mL via INTRAVENOUS

## 2018-10-08 MED ORDER — PROPOFOL 10 MG/ML IV BOLUS
INTRAVENOUS | Status: AC
  Filled 2018-10-08: qty 20

## 2018-10-08 SURGICAL SUPPLY — 15 items

## 2018-10-08 NOTE — H&P (Signed)
GASTROENTEROLOGY OUTPATIENT PROCEDURE H&P NOTE   Primary Care Physician: [name], MD  HPI: Madeline Martinez is a 73 y.o. female who presents for EGD/EUS with possible EMR.  Past Medical History:  Diagnosis Date  . Acid reflux   . Anxiety   . Depression   . Diabetes mellitus   . Dysrhythmia   . Gout   . Heart murmur   . HTN (hypertension)   . Hyperlipidemia   . Sleep apnea    Past Surgical History:  Procedure Laterality Date  . ABDOMINAL HYSTERECTOMY    . BACK SURGERY  2007   DISCS  . COLONOSCOPY WITH PROPOFOL N/A 12/19/2017   Procedure: COLONOSCOPY WITH PROPOFOL;  Surgeon: Jonathon Bellows, MD;  Location: [facility] ENDOSCOPY;  Service: Gastroenterology;  Laterality: N/A;  . 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 ESOPHAGOGASTRODUODENOSCOPY (EGD) WITH PROPOFOL N/A 08/28/2018   Procedure: ESOPHAGOGASTRODUODENOSCOPY (EGD) WITH PROPOFOL;  Surgeon: Jonathon Bellows, MD;  Location: [facility] ENDOSCOPY;  Service: Gastroenterology;  Laterality: N/A;  . PARTIAL HYSTERECTOMY  1986   Current Facility-Administered Medications  Medication Dose Route Frequency Provider Last Rate Last Dose  . 0.9 %  sodium chloride infusion   Intravenous Continuous Mansouraty, Telford Nab., MD      . lactated ringers infusion   Intravenous Continuous Mansouraty, Telford Nab., MD 125 mL/hr at 10/08/18 1007 1,000 mL at 10/08/18 1007   No Known Allergies Family History  Problem Relation Age of Onset  . Heart failure Father   . Breast cancer Mother   . Other Sister        DEGEN.DISC DISEASE  . Kidney cancer Neg Hx   . Kidney disease Neg Hx   . Prostate cancer Neg Hx    Social History   Socioeconomic History  . Marital status: Single    Spouse name: Not on file  . Number of children: Not on file  . Years of education: Not on file  . Highest education level: Not on file  Occupational History  . Not on file  Social Needs  . Financial resource strain: Not on file  . Food insecurity    Worry: Not  on file    Inability: Not on file  . Transportation needs    Medical: Not on file    Non-medical: Not on file  Tobacco Use  . Smoking status: Never Smoker  . Smokeless tobacco: Never Used  Substance and Sexual Activity  . Alcohol use: No  . Drug use: No  . Sexual activity: Not on file    Comment: HYST  Lifestyle  . Physical activity    Days per week: Not on file    Minutes per session: Not on file  . Stress: Not on file  Relationships  . Social Herbalist on phone: Not on file    Gets together: Not on file    Attends religious service: Not on file    Active member of club or organization: Not on file    Attends meetings of clubs or organizations: Not on file    Relationship status: Not on file  . Intimate partner violence    Fear of current or ex partner: Not on file    Emotionally abused: Not on file    Physically abused: Not on file    Forced sexual activity: Not on file  Other Topics Concern  . Not on file  Social History Narrative  . Not on file    Physical Exam:  Vital signs in last 24 hours: Temp:  [98.1 F (36.7 C)] 98.1 F (36.7 C) (08/12 0949) Pulse Rate:  [88] 88 (08/12 0949) Resp:  [16] 16 (08/12 0949) BP: (193)/(99) 193/99 (08/12 0949) SpO2:  [96 %] 96 % (08/12 0949) Weight:  [90.7 kg-92.1 kg] 90.7 kg (08/12 0949)   GEN: NAD EYE: Sclerae anicteric ENT: MMM RESP: CTAB posteriorly GI: Soft, NT/ND, protuberant NEURO:  Alert & Oriented x 3  Lab Results: No results for input(s): WBC, HGB, HCT, PLT in the last 72 hours. BMET No results for input(s): NA, K, CL, CO2, GLUCOSE, BUN, CREATININE, CALCIUM in the last 72 hours. LFT No results for input(s): PROT, ALBUMIN, AST, ALT, ALKPHOS, BILITOT, BILIDIR, IBILI in the last 72 hours. PT/INR No results for input(s): LABPROT, INR in the last 72 hours.   Impression / Plan: This is a 73 y.o.female who presents for EGD/EUS with possible EMR.  The risks of EUS including bleeding, infection,  aspiration pneumonia and intestinal perforation were discussed as was the possibility it may not give a definitive diagnosis.  If a biopsy of the pancreas is done as part of the EUS, there is an additional risk of pancreatitis at the rate of about 1%.  It was explained that procedure related pancreatitis is typically mild, although can be severe and even life threatening, which is why we do not perform random pancreatic biopsies and only biopsy a lesion we feel is concerning enough to warrant the risk.   The risks and benefits of endoscopic evaluation were discussed with the patient; these include but are not limited to the risk of perforation, infection, bleeding, missed lesions, lack of diagnosis, severe illness requiring hospitalization, as well as anesthesia and sedation related illnesses.  The patient is agreeable to proceed.    Justice Britain, MD [facility] Office # [phone]

## 2018-10-08 NOTE — Anesthesia Preprocedure Evaluation (Signed)
Anesthesia Evaluation  Patient identified by MRN, date of birth, ID band Patient awake    Reviewed: Allergy & Precautions, NPO status , Patient's Chart, lab work & pertinent test results  Airway Mallampati: II  TM Distance: >3 FB Neck ROM: Full    Dental  (+) Edentulous Upper, Edentulous Lower   Pulmonary    breath sounds clear to auscultation       Cardiovascular hypertension,  Rhythm:Regular Rate:Normal     Neuro/Psych    GI/Hepatic   Endo/Other  diabetes  Renal/GU      Musculoskeletal   Abdominal   Peds  Hematology   Anesthesia Other Findings   Reproductive/Obstetrics                             Anesthesia Physical Anesthesia Plan  ASA: III  Anesthesia Plan: General   Post-op Pain Management:    Induction: Intravenous  PONV Risk Score and Plan: Ondansetron and Dexamethasone  Airway Management Planned: Oral ETT  Additional Equipment:   Intra-op Plan:   Post-operative Plan: Extubation in OR  Informed Consent: I have reviewed the patients History and Physical, chart, labs and discussed the procedure including the risks, benefits and alternatives for the proposed anesthesia with the patient or authorized representative who has indicated his/her understanding and acceptance.       Plan Discussed with: CRNA and Anesthesiologist  Anesthesia Plan Comments:         Anesthesia Quick Evaluation

## 2018-10-08 NOTE — Anesthesia Postprocedure Evaluation (Signed)
Anesthesia Post Note  Patient: Madeline Martinez  Procedure(s) Performed: ESOPHAGOGASTRODUODENOSCOPY (EGD) WITH PROPOFOL (N/A ) ENDOSCOPIC MUCOSAL RESECTION (N/A ) UPPER ENDOSCOPIC ULTRASOUND (EUS) RADIAL (N/A ) POLYPECTOMY BIOPSY     Patient location during evaluation: PACU Anesthesia Type: General Level of consciousness: awake and alert Pain management: pain level controlled Vital Signs Assessment: post-procedure vital signs reviewed and stable Respiratory status: spontaneous breathing, nonlabored ventilation, respiratory function stable and patient connected to nasal cannula oxygen Cardiovascular status: blood pressure returned to baseline and stable Postop Assessment: no apparent nausea or vomiting Anesthetic complications: no    Last Vitals:  Vitals:   10/08/18 1220 10/08/18 1230  BP: (!) 160/75 (!) 144/70  Pulse: 93 86  Resp: 15 20  Temp:    SpO2: 91% 93%    Last Pain:  Vitals:   10/08/18 1230  TempSrc:   PainSc: 0-No pain                 Marisa Hufstetler COKER

## 2018-10-08 NOTE — Discharge Instructions (Signed)
YOU HAD AN ENDOSCOPIC PROCEDURE TODAY: Refer to the procedure report and other information in the discharge instructions given to you for any specific questions about what was found during the examination. If this information does not answer your questions, please call [place] office at [phone] to clarify.  ° °YOU SHOULD EXPECT: Some feelings of bloating in the abdomen. Passage of more gas than usual. Walking can help get rid of the air that was put into your GI tract during the procedure and reduce the bloating.  ° °DIET: Your first meal following the procedure should be a light meal and then it is ok to progress to your normal diet. A half-sandwich or bowl of soup is an example of a good first meal. Heavy or fried foods are harder to digest and may make you feel nauseous or bloated. Drink plenty of fluids but you should avoid alcoholic beverages for 24 hours. ° °ACTIVITY: Your care partner should take you home directly after the procedure. You should plan to take it easy, moving slowly for the rest of the day. You can resume normal activity the day after the procedure however YOU SHOULD NOT DRIVE, use power tools, machinery or perform tasks that involve climbing or major physical exertion for 24 hours (because of the sedation medicines used during the test).  ° °SYMPTOMS TO REPORT IMMEDIATELY: °A gastroenterologist can be reached at any hour. Please call [phone]  for any of the following symptoms:  ° °Following upper endoscopy (EGD, EUS, ERCP, esophageal dilation) °Vomiting of blood or coffee ground material  °New, significant abdominal pain  °New, significant chest pain or pain under the shoulder blades  °Painful or persistently difficult swallowing  °New shortness of breath  °Black, tarry-looking or red, bloody stools ° °FOLLOW UP:  °If any biopsies were taken you will be contacted by phone or by letter within the next 1-3 weeks. Call [phone]  if you have not heard about the biopsies in 3 weeks.    °Please also call with any specific questions about appointments or follow up tests. ° °

## 2018-10-08 NOTE — Op Note (Signed)
[facility] Patient Name: Madeline Martinez Procedure Date: 10/08/2018 MRN: [id number] Attending MD: Justice Britain , MD Date of Birth: February 03, 1946 CSN: [id number] Age: 73 Admit Type: Outpatient Procedure:                Upper EUS Indications:              Gastric deformity on endoscopy/Subepithelial tumor                            versus extrinsic compression Providers:                Justice Britain, MD, Burtis Junes, RN, Elspeth Cho Tech., Technician, Anne Fu                            CRNA, CRNA Referring MD:             Jonathon Bellows MD, MD Medicines:                General Anesthesia Complications:            No immediate complications. Estimated Blood Loss:     Estimated blood loss was minimal. Procedure:                Pre-Anesthesia Assessment:                           - Prior to the procedure, a History and Physical                            was performed, and patient medications and                            allergies were reviewed. The patient's tolerance of                            previous anesthesia was also reviewed. The risks                            and benefits of the procedure and the sedation                            options and risks were discussed with the patient.                            All questions were answered, and informed consent                            was obtained. Prior Anticoagulants: The patient has                            taken no previous anticoagulant or antiplatelet                            agents. ASA Grade  Assessment: III - A patient with                            severe systemic disease. After reviewing the risks                            and benefits, the patient was deemed in                            satisfactory condition to undergo the procedure.                           After obtaining informed consent, the endoscope was                            passed  under direct vision. Throughout the                            procedure, the patient's blood pressure, pulse, and                            oxygen saturations were monitored continuously. The                            [id number]-[id number] ([id number]) Olympus gastroscope was                            introduced through the mouth, and advanced to the                            second part of duodenum. The GF-[id number]-[id number] ([id number])                            Olympus Radial EUS was introduced through the                            mouth, and advanced to the stomach for ultrasound                            examination. The upper EUS was accomplished without                            difficulty. The patient tolerated the procedure. Scope In: Scope Out: Findings:      ENDOSCOPIC FINDING: :      No gross lesions were noted in the proximal esophagus, in the mid       esophagus and in the distal esophagus.      Localized moderate mucosal changes characterized by congestion and       altered texture were found at the gastroesophageal junction at 36 cm.       Biopsies were taken with a cold forceps for histology.      A single 15 mm submucosal papule (nodule) with no stigmata of recent       bleeding was found in the gastric antrum. After the EUS as noted  below       was performed, preparations were made for mucosal resection. Orise gel       was injected to raise the lesion. Using the Duette ligator we placed a       single band and snare mucosal resection was performed. Resection and       retrieval were complete. To close the defect after mucosal resection,       three hemostatic clips were successfully placed (MR conditional). There       was no bleeding during, or at the end, of the procedure.      A single 4 mm sessile polyp with no bleeding and no stigmata of recent       bleeding was found in the gastric antrum adjacent to the previously       noted nodule. It had an inflammatory appearance likely from  friction       from larger nodule. The polyp was removed with a cold snare. Resection       and retrieval were complete. To prevent bleeding after the polypectomy,       one hemostatic clip was successfully placed (MR conditional). There was       no bleeding at the end of the procedure.      No other gross lesions were noted in the entire examined stomach.      No gross lesions were noted in the duodenal bulb, in the first portion       of the duodenum and in the second portion of the duodenum.      ENDOSONOGRAPHIC FINDING: :      A round intramural (subepithelial) lesion was found in the antrum of the       stomach. The lesion was hypoechoic. Sonographically, the lesion appeared       to originate from the luminal interface/superficial mucosa (Layer 1) and       deep mucosa (Layer 2). The lesion measured 12 mm (in maximum thickness).       The lesion also measured 11 mm in diameter. The outer endosonographic       borders were well defined.      Endosonographic imaging in the cardia of the stomach and in the body of       the stomach showed no wall thickening.      Endosonographic imaging in the duodenal bulb showed no wall thickening.      Endosonographic imaging in the visualized portion of the liver showed no       mass.      No malignant-appearing lymph nodes were visualized in the left gastric       region (level 17), gastrohepatic ligament (level 18) and celiac region       (level 20).      The celiac region was visualized. Impression:               EGD Impression:                           - No gross lesions in esophagus.                           - Congested, texture changed mucosa at the [facility]. Biopsied.                           -  A single submucosal papule (nodule) found in the                            stomach. After EUS, complete removal was                            accomplished via mucosal band resection. Clips (MR                             conditional) were placed.                           - A single gastric polyp. Resected and retrieved.                            Clip (MR conditional) was placed.                           - No gross lesions in the duodenal bulb, in the                            first portion of the duodenum and in the second                            portion of the duodenum.                           EUS Impression:                           - An intramural (subepithelial) lesion was found in                            the antrum of the stomach. The lesion appeared to                            originate from within the luminal                            interface/superficial mucosa (Layer 1) and deep                            mucosa (Layer 2). Tissue was obtained from this                            exam, and results are pending. However, the                            endosonographic appearance is suspicious for benign                            hyperplastic change as per prior biopsies.                           -  No malignant-appearing lymph nodes were                            visualized in the left gastric region (level 17),                            gastrohepatic ligament (level 18) and celiac region                            (level 20). Moderate Sedation:      Not Applicable - Patient had care per Anesthesia. Recommendation:           - The patient will be observed post-procedure,                            until all discharge criteria are met.                           - Discharge patient to home.                           - Patient has a contact number available for                            emergencies. The signs and symptoms of potential                            delayed complications were discussed with the                            patient. Return to normal activities tomorrow.                            Written discharge instructions were provided to the                             patient.                           - Observe patient's clinical course.                           - Increase PPI to 40 mg Omeprazole twice daily x                            66-month. Then can go back to prior dosing (she was                            taking 20 mg TID vs 20 mg BID) - will defer to                            primary GI to discuss, as long as biopsies are  non-concerning.                           - Await path results.                           - Repeat EGD to evaluate for                            recurrence/surveillance to be dictated by final                            pathology in 6-12 months.                           - The findings and recommendations were discussed                            with the patient.                           - The findings and recommendations were discussed                            with the patient's family. Procedure Code(s):        --- Professional ---                           [phone], Esophagogastroduodenoscopy, flexible,                            transoral; with endoscopic mucosal resection                           [id number], Esophagogastroduodenoscopy, flexible,                            transoral; with endoscopic ultrasound examination                            limited to the esophagus, stomach or duodenum, and                            adjacent structures Diagnosis Code(s):        --- Professional ---                           K22.8, Other specified diseases of esophagus                           K31.89, Other diseases of stomach and duodenum                           K31.7, Polyp of stomach and duodenum                           I89.9, Noninfective disorder of lymphatic vessels  and lymph nodes, unspecified CPT copyright 2019 American Medical Association. All rights reserved. The codes documented in this report are preliminary and upon coder review may  be revised to  meet current compliance requirements. Justice Britain, MD 10/08/2018 12:02:06 PM Number of Addenda: 0

## 2018-10-08 NOTE — Transfer of Care (Signed)
Immediate Anesthesia Transfer of Care Note  Patient: Madeline Martinez  Procedure(s) Performed: Procedure(s): ESOPHAGOGASTRODUODENOSCOPY (EGD) WITH PROPOFOL (N/A) ENDOSCOPIC MUCOSAL RESECTION (N/A) UPPER ENDOSCOPIC ULTRASOUND (EUS) RADIAL (N/A) POLYPECTOMY BIOPSY  Patient Location: PACU  Anesthesia Type:General  Level of Consciousness:  sedated, patient cooperative and responds to stimulation  Airway & Oxygen Therapy:Patient Spontanous Breathing and Patient connected to face mask oxgen  Post-op Assessment:  Report given to PACU RN and Post -op Vital signs reviewed and stable  Post vital signs:  Reviewed and stable  Last Vitals:  Vitals:   10/08/18 1139 10/08/18 1140  BP: (!) 177/78 (!) 177/78  Pulse: 92 95  Resp: 16 17  Temp: 36.7 C   SpO2: 997% 74%    Complications: No apparent anesthesia complications

## 2018-10-08 NOTE — Anesthesia Procedure Notes (Signed)
Procedure Name: Intubation Date/Time: 10/08/2018 10:27 AM Performed by: Anne Fu, CRNA Pre-anesthesia Checklist: Patient identified, Emergency Drugs available, Suction available, Patient being monitored and Timeout performed Patient Re-evaluated:Patient Re-evaluated prior to induction Oxygen Delivery Method: Circle system utilized Preoxygenation: Pre-oxygenation with 100% oxygen Induction Type: IV induction Ventilation: Mask ventilation without difficulty Laryngoscope Size: Mac and 4 Grade View: Grade I Tube type: Oral Tube size: 7.5 mm Number of attempts: 1 Airway Equipment and Method: Stylet Placement Confirmation: ETT inserted through vocal cords under direct vision,  positive ETCO2 and breath sounds checked- equal and bilateral Secured at: 21 cm Tube secured with: Tape Dental Injury: Teeth and Oropharynx as per pre-operative assessment

## 2018-10-10 ENCOUNTER — Encounter (HOSPITAL_COMMUNITY): Payer: Self-pay | Admitting: Gastroenterology

## 2018-10-13 ENCOUNTER — Ambulatory Visit: Payer: Medicare HMO | Admitting: Family Medicine

## 2018-10-13 ENCOUNTER — Encounter: Payer: Self-pay | Admitting: Gastroenterology

## 2018-10-13 ENCOUNTER — Other Ambulatory Visit: Payer: Self-pay

## 2018-10-13 DIAGNOSIS — R351 Nocturia: Secondary | ICD-10-CM | POA: Diagnosis not present

## 2018-10-13 NOTE — Progress Notes (Signed)
PTNS  Session # 9  Health & Social Factors: no change Caffeine: 1 Alcohol: 0 Daytime voids #per day: 4 Night-time voids #per night: 1 Urgency: mild Incontinence Episodes #per day: 1 Ankle used: left Treatment Setting: 6 Feeling/ Response: Sensory Comments: Patient tolerated well  Preformed By: Elberta Leatherwood, CMA  Follow Up: 1 week #10

## 2018-10-15 ENCOUNTER — Telehealth: Payer: Self-pay

## 2018-10-15 NOTE — Telephone Encounter (Signed)
-----   Message from Jonathon Bellows, MD sent at 10/12/2018 12:30 PM EDT ----- The biopsies which I have taken prior to her EUS at [facility] were all benign . Madeline Martinez looked at her biopsies from [place] too and they are benign as well

## 2018-10-15 NOTE — Telephone Encounter (Signed)
Called pt to inform her of biopsy results and Dr. Georgeann Oppenheim instructions to repeat upper endoscopy in 6 months.  Unable to contact, call did not go to VM

## 2018-10-20 ENCOUNTER — Ambulatory Visit: Payer: Medicare HMO

## 2018-10-21 ENCOUNTER — Encounter: Payer: Self-pay | Admitting: Urology

## 2018-10-27 ENCOUNTER — Other Ambulatory Visit: Payer: Self-pay

## 2018-10-27 ENCOUNTER — Ambulatory Visit: Payer: Medicare HMO | Admitting: Family Medicine

## 2018-10-27 DIAGNOSIS — R351 Nocturia: Secondary | ICD-10-CM

## 2018-10-27 NOTE — Progress Notes (Signed)
PTNS  Session # 10  Health & Social Factors: no change Caffeine: 1 Alcohol: 0 Daytime voids #per day: 4 Night-time voids #per night: 1 Urgency: mild Incontinence Episodes #per day: 1 Ankle used: right Treatment Setting: 7 Feeling/ Response: sensory Comments: Patient tolerated well  Preformed By: Elberta Leatherwood, CMA  Follow Up: 1 week #11

## 2018-11-07 ENCOUNTER — Ambulatory Visit: Payer: Medicare HMO | Admitting: Family Medicine

## 2018-11-07 ENCOUNTER — Other Ambulatory Visit: Payer: Self-pay

## 2018-11-07 DIAGNOSIS — R351 Nocturia: Secondary | ICD-10-CM

## 2018-11-07 NOTE — Progress Notes (Signed)
PTNS  Session # 11  Health & Social Factors: no change Caffeine: 1 Alcohol: 0 Daytime voids #per day: 5 Night-time voids #per night: 0 Urgency: mild Incontinence Episodes #per day: 0 Ankle used: right Treatment Setting: 8 Feeling/ Response: sensory Comments: Patient tolerated well  Preformed By: Elberta Leatherwood, CMA  Follow Up: 1 week #12

## 2018-11-07 NOTE — Addendum Note (Signed)
Addended by: Kyra Manges on: 11/07/2018 02:49 PM   Modules accepted: Level of Service

## 2018-11-10 NOTE — Progress Notes (Signed)
11/11/2018 4:47 PM   Rozetta Nunnery 1946-02-21 [id number]  Referring provider: [name], [place],  [place] [address]  Chief Complaint  Patient presents with  . Nocturia    HPI: Patient is a 73 year old female with nighttime incontinence and nocturia who presents today for a follow up visit.    OAB Currently on Mybetriq 50 mg daily, PTNS and CPAP.    The patient is  experiencing urgency x 0-3 (improved), frequency x 4-7 (stable), not restricting fluids to avoid visits to the restroom, is engaging in toilet mapping, incontinence x 0-3 (improved) and nocturia x 0-3 (improved).   Her BP is 165/82.   Her PVR is 43 mL.      Her main complaint is urgency.    She has sleep apnea and sleeps with a CPAP machine.     She is not sure what her sugars have been running as she has been taking care of her daughter after her knee surgery the last two weeks.    PMH: Past Medical History:  Diagnosis Date  . Acid reflux   . Anxiety   . Depression   . Diabetes mellitus   . Dysrhythmia   . Gout   . Heart murmur   . HTN (hypertension)   . Hyperlipidemia   . Sleep apnea     Surgical History: Past Surgical History:  Procedure Laterality Date  . ABDOMINAL HYSTERECTOMY    . BACK SURGERY  2007   DISCS  . BIOPSY  10/08/2018   Procedure: BIOPSY;  Surgeon: Irving Copas., MD;  Location: [facility];  Service: Gastroenterology;;  . COLONOSCOPY WITH PROPOFOL N/A 12/19/2017   Procedure: COLONOSCOPY WITH PROPOFOL;  Surgeon: Jonathon Bellows, MD;  Location: [facility] ENDOSCOPY;  Service: Gastroenterology;  Laterality: N/A;  . 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 ENDOSCOPIC MUCOSAL RESECTION N/A 10/08/2018   Procedure: ENDOSCOPIC MUCOSAL RESECTION;  Surgeon: Rush Landmark Telford Nab., MD;  Location: [facility] ENDOSCOPY;  Service: Gastroenterology;  Laterality: N/A;  . ESOPHAGOGASTRODUODENOSCOPY (EGD) WITH PROPOFOL N/A 08/28/2018   Procedure: ESOPHAGOGASTRODUODENOSCOPY  (EGD) WITH PROPOFOL;  Surgeon: Jonathon Bellows, MD;  Location: [facility] ENDOSCOPY;  Service: Gastroenterology;  Laterality: N/A;  . ESOPHAGOGASTRODUODENOSCOPY (EGD) WITH PROPOFOL N/A 10/08/2018   Procedure: ESOPHAGOGASTRODUODENOSCOPY (EGD) WITH PROPOFOL;  Surgeon: Rush Landmark Telford Nab., MD;  Location: [facility] ENDOSCOPY;  Service: Gastroenterology;  Laterality: N/A;  . EUS N/A 10/08/2018   Procedure: UPPER ENDOSCOPIC ULTRASOUND (EUS) RADIAL;  Surgeon: Rush Landmark Telford Nab., MD;  Location: [facility] ENDOSCOPY;  Service: Gastroenterology;  Laterality: N/A;  . PARTIAL HYSTERECTOMY  1986  . POLYPECTOMY  10/08/2018   Procedure: POLYPECTOMY;  Surgeon: Mansouraty, Telford Nab., MD;  Location: [facility]OS[facility];  Service: Gastroenterology;;    Home Medications:  Allergies as of 11/11/2018   No Known Allergies     Medication List       Accurate as of November 11, 2018 11:59 PM. If you have any questions, ask your nurse or doctor.        allopurinol 100 MG tablet Commonly known as: ZYLOPRIM Take 100 mg by mouth daily with lunch.   atorvastatin 80 MG tablet Commonly known as: LIPITOR Take 80 mg by mouth daily.   Biotin 5000 MCG Tabs Take 5,000 mcg by mouth daily. HAIR/SKIN/NAILS   carvedilol 12.5 MG tablet Commonly known as: COREG Take 12.5 mg by mouth 2 (two) times daily.   cetirizine 10 MG tablet Commonly known as: ZYRTEC Take 10 mg by mouth daily.  FLUoxetine 40 MG capsule Commonly known as: PROZAC Take 40 mg by mouth daily.   glipiZIDE 5 MG 24 hr tablet Commonly known as: GLUCOTROL XL Take 5 mg by mouth 3 (three) times daily.   HYDROcodone-acetaminophen 5-325 MG tablet Commonly known as: NORCO/VICODIN Take 1 tablet by mouth daily as needed (pain.).   linaclotide 290 MCG Caps capsule Commonly known as: Linzess Take 1 capsule (290 mcg total) by mouth daily before breakfast.   LORazepam 1 MG tablet Commonly known as: ATIVAN Take 1 mg by mouth 4 (four) times daily.   meloxicam 15 MG tablet  Commonly known as: MOBIC Take 15 mg by mouth daily.   metFORMIN 1000 MG tablet Commonly known as: GLUCOPHAGE Take 1,000 mg by mouth 2 (two) times daily.   methocarbamol 750 MG tablet Commonly known as: ROBAXIN Take 750 mg by mouth daily with lunch.   mirabegron ER 50 MG Tb24 tablet Commonly known as: MYRBETRIQ Take 1 tablet (50 mg total) by mouth daily.   mirtazapine 15 MG tablet Commonly known as: REMERON Take 15 mg by mouth at bedtime.   omeprazole 20 MG capsule Commonly known as: PRILOSEC Take 2 capsules (40 mg total) by mouth 2 (two) times daily before a meal.   PROBIOTIC PO Take 1 tablet by mouth daily.   Toujeo SoloStar 300 UNIT/ML Sopn Generic drug: Insulin Glargine (1 Unit Dial) Inject 20 Units into the skin daily.   Vitamin D-3 125 MCG (5000 UT) Tabs Take 5,000 Units by mouth daily.       Allergies: No Known Allergies  Family History: Family History  Problem Relation Age of Onset  . Heart failure Father   . Breast cancer Mother   . Other Sister        DEGEN.DISC DISEASE  . Kidney cancer Neg Hx   . Kidney disease Neg Hx   . Prostate cancer Neg Hx     Social History:  reports that she has never smoked. She has never used smokeless tobacco. She reports that she does not drink alcohol or use drugs.  ROS: UROLOGY Frequent Urination?: No Hard to postpone urination?: Yes Burning/pain with urination?: No Get up at night to urinate?: No Leakage of urine?: No Urine stream starts and stops?: No Trouble starting stream?: No Do you have to strain to urinate?: No Blood in urine?: No Urinary tract infection?: No Sexually transmitted disease?: No Injury to kidneys or bladder?: No Painful intercourse?: No Weak stream?: No Currently pregnant?: No Vaginal bleeding?: No Last menstrual period?: n  Gastrointestinal Nausea?: No Vomiting?: No Indigestion/heartburn?: Yes Diarrhea?: No Constipation?: Yes  Constitutional Fever: No Night sweats?: No  Weight loss?: No Fatigue?: Yes  Skin Skin rash/lesions?: No Itching?: No  Eyes Blurred vision?: No Double vision?: No  Ears/Nose/Throat Sore throat?: No Sinus problems?: Yes  Hematologic/Lymphatic Swollen glands?: No Easy bruising?: No  Cardiovascular Leg swelling?: No Chest pain?: No  Respiratory Cough?: No Shortness of breath?: No  Endocrine Excessive thirst?: No  Musculoskeletal Back pain?: No Joint pain?: No  Neurological Headaches?: No Dizziness?: No  Psychologic Depression?: No Anxiety?: No  Physical Exam: BP (!) 165/82 (BP Location: Left Arm, Patient Position: Sitting, Cuff Size: Normal)   Pulse 86   Ht 5' (1.524 m)   Wt 200 lb (90.7 kg)   BMI 39.06 kg/m   Constitutional:  Well nourished. Alert and oriented, No acute distress. HEENT: [place] AT, moist mucus membranes.  Trachea midline, no masses. Cardiovascular: No clubbing, cyanosis, or edema. Respiratory: Normal respiratory  effort, no increased work of breathing. Neurologic: Grossly intact, no focal deficits, moving all 4 extremities. Psychiatric: Normal mood and affect.   Laboratory Data: Lab Results  Component Value Date   WBC 6.4 04/06/2013   HGB 13.5 04/06/2013   HCT 40.5 04/06/2013   MCV 93.3 04/06/2013   PLT 389 04/06/2013    Lab Results  Component Value Date   CREATININE 0.78 04/06/2013    No results found for: PSA  No results found for: TESTOSTERONE  No results found for: HGBA1C  No results found for: TSH     Component Value Date/Time   CHOL 217 (A) 02/14/2010   HDL 61 02/14/2010   LDLCALC 135 02/14/2010    Lab Results  Component Value Date   AST 31 04/06/2013   Lab Results  Component Value Date   ALT 28 04/06/2013   No components found for: ALKALINEPHOPHATASE No components found for: BILIRUBINTOTAL  No results found for: ESTRADIOL  Urinalysis    Component Value Date/Time   APPEARANCEUR Hazy (A) 08/04/2018 1403   GLUCOSEU Negative 08/04/2018 1403    BILIRUBINUR Negative 08/04/2018 1403   PROTEINUR Negative 08/04/2018 1403   NITRITE Negative 08/04/2018 1403   LEUKOCYTESUR Negative 08/04/2018 1403    I have reviewed the labs.   Pertinent Imaging: Results for GRETE, DELUDE (MRN [id number]) as of 11/13/2018 16:46  Ref. Range 11/11/2018 13:54  Scan Result Unknown 43     Assessment & Plan:    1. Nighttime Incontinence Greatly improved with the Myrbetriq, CPAP and PTNS Continue the Myrbetriq, CPAP and PTNS - Thursday will be her last weekly PTNS treatment and then she will move to monthly   2. Nocturia See above  3. Urgency See above    Return for Thursday for PTNS .  These notes generated with voice recognition software. I apologize for typographical errors.  Zara Council, PA-C  [facility] [address] [place], [place] [address] [phone]

## 2018-11-11 ENCOUNTER — Encounter: Payer: Self-pay | Admitting: Urology

## 2018-11-11 ENCOUNTER — Other Ambulatory Visit: Payer: Self-pay

## 2018-11-11 ENCOUNTER — Ambulatory Visit: Payer: Medicare HMO | Admitting: Urology

## 2018-11-11 VITALS — BP 165/82 | HR 86 | Ht 60.0 in | Wt 200.0 lb

## 2018-11-11 DIAGNOSIS — N3944 Nocturnal enuresis: Secondary | ICD-10-CM

## 2018-11-11 DIAGNOSIS — R351 Nocturia: Secondary | ICD-10-CM

## 2018-11-11 DIAGNOSIS — R3915 Urgency of urination: Secondary | ICD-10-CM | POA: Diagnosis not present

## 2018-11-11 LAB — BLADDER SCAN AMB NON-IMAGING: Scan Result: 43

## 2018-11-13 ENCOUNTER — Ambulatory Visit: Payer: Medicare HMO | Admitting: *Deleted

## 2018-11-13 ENCOUNTER — Other Ambulatory Visit: Payer: Self-pay

## 2018-11-13 DIAGNOSIS — R351 Nocturia: Secondary | ICD-10-CM

## 2018-11-13 NOTE — Progress Notes (Signed)
PTNS  Session # 12  Health & Social Factors: No change Caffeine: 1 Alcohol: 0 Daytime voids #per day: 5 Night-time voids #per night: 0 Urgency: Mild Incontinence Episodes #per day: 0 Ankle used: Left Treatment Setting: 7 Feeling/ Response: both Comments: Tolerated well  Performed by: Verlene Mayer, CMA   Follow Up: with provider

## 2018-11-27 NOTE — Telephone Encounter (Signed)
Patient verbalized understanding. Put patient on recall list for 6 months EGD

## 2018-12-16 ENCOUNTER — Other Ambulatory Visit: Payer: Self-pay

## 2018-12-16 ENCOUNTER — Ambulatory Visit (INDEPENDENT_AMBULATORY_CARE_PROVIDER_SITE_OTHER): Payer: Medicare HMO

## 2018-12-16 DIAGNOSIS — R351 Nocturia: Secondary | ICD-10-CM

## 2018-12-16 NOTE — Progress Notes (Signed)
Session # maintenance   Health & Social Factors: No change Caffeine: 1 Alcohol: 0 Daytime voids #per day: 5 Night-time voids #per night: 0 Urgency: Mild Incontinence Episodes #per day: 0 Ankle used: Left Treatment Setting: 7 Feeling/ Response: both Comments: Tolerated well  Performed by: Gaspar Cola  CMA   Follow Up: one month maintenance

## 2018-12-25 ENCOUNTER — Other Ambulatory Visit: Payer: Self-pay | Admitting: Gastroenterology

## 2019-01-19 ENCOUNTER — Other Ambulatory Visit: Payer: Self-pay

## 2019-01-19 ENCOUNTER — Ambulatory Visit: Payer: Medicare HMO | Admitting: Physician Assistant

## 2019-01-19 DIAGNOSIS — N3944 Nocturnal enuresis: Secondary | ICD-10-CM | POA: Diagnosis not present

## 2019-01-19 NOTE — Progress Notes (Signed)
PTNS  Session # monthly maintenance  Health & Social Factors: no change Caffeine: 1 Alcohol: 0 Daytime voids #per day: 6 Night-time voids #per night: unknown Urgency: strong Incontinence Episodes #per day: unknown Ankle used: right Treatment Setting: 3 Feeling/ Response: both Comments: Patient reports urinary incontinence only at night; she is unsure how many occurrences there are because she is not always awoken by them.  Performed By: Debroah Loop, PA-C   Follow Up: Return in about 4 weeks (around 02/16/2019) for PTNS maintenance.

## 2019-02-16 ENCOUNTER — Ambulatory Visit: Payer: Medicare HMO | Admitting: Physician Assistant

## 2019-03-04 ENCOUNTER — Other Ambulatory Visit: Payer: Self-pay | Admitting: Gastroenterology

## 2019-03-04 NOTE — Progress Notes (Signed)
03/05/2019 2:27 PM   Madeline Martinez [birth year] [id number]  Referring provider: [name], [place],  [place] [address]  Chief Complaint  Patient presents with  . Nocturia    HPI: Patient is a 74 year old female with nighttime incontinence and nocturia who presents today stating the PTNS is not working.    OAB Currently on Mybetriq 50 mg daily, PTNS and CPAP.   She feels initially the PTNS was effective.  She was no longer having to wear incontinent pads, but she believes approximately 3 months ago she started experiencing incontinent episodes during the night.  She is now wearing pads nightly and they are wet every night.  She did admit that she is not consistent in her use of the CPAP machine.    She currently has a CGM in place to monitor her blood sugars.  Her sugars have been ranging from 100-200 daily.  Patient denies any gross hematuria, dysuria or suprapubic/flank pain.  Patient denies any fevers, chills, nausea or vomiting.    PMH: Past Medical History:  Diagnosis Date  . Acid reflux   . Anxiety   . Depression   . Diabetes mellitus   . Dysrhythmia   . Gout   . Heart murmur   . HTN (hypertension)   . Hyperlipidemia   . Sleep apnea     Surgical History: Past Surgical History:  Procedure Laterality Date  . ABDOMINAL HYSTERECTOMY    . BACK SURGERY  2007   DISCS  . BIOPSY  10/08/2018   Procedure: BIOPSY;  Surgeon: Irving Copas., MD;  Location: [facility];  Service: Gastroenterology;;  . COLONOSCOPY WITH PROPOFOL N/A 12/19/2017   Procedure: COLONOSCOPY WITH PROPOFOL;  Surgeon: Jonathon Bellows, MD;  Location: [facility] ENDOSCOPY;  Service: Gastroenterology;  Laterality: N/A;  . 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 ENDOSCOPIC MUCOSAL RESECTION N/A 10/08/2018   Procedure: ENDOSCOPIC MUCOSAL RESECTION;  Surgeon: Rush Landmark Telford Nab., MD;  Location: [facility] ENDOSCOPY;  Service: Gastroenterology;  Laterality: N/A;  .  ESOPHAGOGASTRODUODENOSCOPY (EGD) WITH PROPOFOL N/A 08/28/2018   Procedure: ESOPHAGOGASTRODUODENOSCOPY (EGD) WITH PROPOFOL;  Surgeon: Jonathon Bellows, MD;  Location: [facility] ENDOSCOPY;  Service: Gastroenterology;  Laterality: N/A;  . ESOPHAGOGASTRODUODENOSCOPY (EGD) WITH PROPOFOL N/A 10/08/2018   Procedure: ESOPHAGOGASTRODUODENOSCOPY (EGD) WITH PROPOFOL;  Surgeon: Rush Landmark Telford Nab., MD;  Location: [facility] ENDOSCOPY;  Service: Gastroenterology;  Laterality: N/A;  . EUS N/A 10/08/2018   Procedure: UPPER ENDOSCOPIC ULTRASOUND (EUS) RADIAL;  Surgeon: Rush Landmark Telford Nab., MD;  Location: [facility] ENDOSCOPY;  Service: Gastroenterology;  Laterality: N/A;  . PARTIAL HYSTERECTOMY  1986  . POLYPECTOMY  10/08/2018   Procedure: POLYPECTOMY;  Surgeon: Mansouraty, Telford Nab., MD;  Location: [facility] ENDOSCOPY;  Service: Gastroenterology;;    Home Medications:  Allergies as of 03/05/2019   No Known Allergies     Medication List       Accurate as of March 05, 2019 11:59 PM. If you have any questions, ask your nurse or doctor.        allopurinol 100 MG tablet Commonly known as: ZYLOPRIM Take 100 mg by mouth daily with lunch.   atorvastatin 80 MG tablet Commonly known as: LIPITOR Take 80 mg by mouth daily.   Biotin 5000 MCG Tabs Take 5,000 mcg by mouth daily. HAIR/SKIN/NAILS   carvedilol 12.5 MG tablet Commonly known as: COREG Take 12.5 mg by mouth 2 (two) times daily.   cetirizine 10 MG tablet Commonly known as: ZYRTEC Take 10 mg by mouth daily.  FLUoxetine 40 MG capsule Commonly known as: PROZAC Take 40 mg by mouth daily.   glipiZIDE 5 MG 24 hr tablet Commonly known as: GLUCOTROL XL Take 5 mg by mouth 3 (three) times daily.   HYDROcodone-acetaminophen 5-325 MG tablet Commonly known as: NORCO/VICODIN Take 1 tablet by mouth daily as needed (pain.).   Linzess 290 MCG Caps capsule Generic drug: linaclotide TAKE 1 CAPSULE (290 MCG TOTAL) BY MOUTH DAILY BEFORE BREAKFAST.   LORazepam 1 MG  tablet Commonly known as: ATIVAN Take 1 mg by mouth 4 (four) times daily.   meloxicam 15 MG tablet Commonly known as: MOBIC Take 15 mg by mouth daily.   metFORMIN 1000 MG tablet Commonly known as: GLUCOPHAGE Take 1,000 mg by mouth 2 (two) times daily.   methocarbamol 750 MG tablet Commonly known as: ROBAXIN Take 750 mg by mouth daily with lunch.   mirabegron ER 50 MG Tb24 tablet Commonly known as: MYRBETRIQ Take 1 tablet (50 mg total) by mouth daily.   mirtazapine 15 MG tablet Commonly known as: REMERON Take 15 mg by mouth at bedtime.   omeprazole 20 MG capsule Commonly known as: PRILOSEC TAKE 2 CAPSULES (40 MG TOTAL) BY MOUTH 2 (TWO) TIMES DAILY BEFORE A MEAL.   oxybutynin 10 MG 24 hr tablet Commonly known as: DITROPAN-XL Take 1 tablet (10 mg total) by mouth daily. Started by: Zara Council, PA-C   PROBIOTIC PO Take 1 tablet by mouth daily.   Toujeo SoloStar 300 UNIT/ML Sopn Generic drug: Insulin Glargine (1 Unit Dial) Inject 20 Units into the skin daily.   Vitamin D-3 125 MCG (5000 UT) Tabs Take 5,000 Units by mouth daily.       Allergies: No Known Allergies  Family History: Family History  Problem Relation Age of Onset  . Heart failure Father   . Breast cancer Mother   . Other Sister        DEGEN.DISC DISEASE  . Kidney cancer Neg Hx   . Kidney disease Neg Hx   . Prostate cancer Neg Hx     Social History:  reports that she has never smoked. She has never used smokeless tobacco. She reports that she does not drink alcohol or use drugs.  ROS: UROLOGY Frequent Urination?: Yes Hard to postpone urination?: Yes Burning/pain with urination?: No Get up at night to urinate?: No Leakage of urine?: Yes Urine stream starts and stops?: No Trouble starting stream?: No Do you have to strain to urinate?: No Blood in urine?: No Urinary tract infection?: No Sexually transmitted disease?: No Injury to kidneys or bladder?: No Painful intercourse?: No Weak  stream?: No Currently pregnant?: No Vaginal bleeding?: No Last menstrual period?: n  Gastrointestinal Nausea?: No Vomiting?: No Indigestion/heartburn?: Yes Diarrhea?: No Constipation?: Yes  Constitutional Fever: No Night sweats?: No Weight loss?: No Fatigue?: Yes  Skin Skin rash/lesions?: No Itching?: No  Eyes Blurred vision?: No Double vision?: No  Ears/Nose/Throat Sore throat?: No Sinus problems?: Yes  Hematologic/Lymphatic Swollen glands?: No Easy bruising?: No  Cardiovascular Leg swelling?: No Chest pain?: No  Respiratory Cough?: No Shortness of breath?: Yes  Endocrine Excessive thirst?: No  Musculoskeletal Back pain?: Yes Joint pain?: No  Neurological Headaches?: No Dizziness?: No  Psychologic Depression?: No Anxiety?: No  Physical Exam: BP (!) 169/90   Pulse 94   Ht 5' (1.524 m)   BMI 39.06 kg/m   Constitutional:  Well nourished. Alert and oriented, No acute distress. HEENT: [place] AT, mask in place.  Trachea midline, no masses. Cardiovascular:  No clubbing, cyanosis, or edema. Respiratory: Normal respiratory effort, no increased work of breathing.  Neurologic: Grossly intact, no focal deficits, moving all 4 extremities. Psychiatric: Normal mood and affect.  Laboratory Data: Lab Results  Component Value Date   WBC 6.4 04/06/2013   HGB 13.5 04/06/2013   HCT 40.5 04/06/2013   MCV 93.3 04/06/2013   PLT 389 04/06/2013    Lab Results  Component Value Date   CREATININE 0.78 04/06/2013    No results found for: PSA  No results found for: TESTOSTERONE  No results found for: HGBA1C  No results found for: TSH     Component Value Date/Time   CHOL 217 (A) 02/14/2010 0000   HDL 61 02/14/2010 0000   LDLCALC 135 02/14/2010 0000    Lab Results  Component Value Date   AST 31 04/06/2013   Lab Results  Component Value Date   ALT 28 04/06/2013   No components found for: ALKALINEPHOPHATASE No components found for:  BILIRUBINTOTAL  No results found for: ESTRADIOL  Urinalysis    Component Value Date/Time   APPEARANCEUR Hazy (A) 08/04/2018 1403   GLUCOSEU Negative 08/04/2018 1403   BILIRUBINUR Negative 08/04/2018 1403   PROTEINUR Negative 08/04/2018 1403   NITRITE Negative 08/04/2018 1403   LEUKOCYTESUR Negative 08/04/2018 1403    I have reviewed the labs.  Pertinent Imaging: Results for MATASHA, BORZA (MRN [id number]) as of 11/13/2018 16:46  Ref. Range 11/11/2018 13:54  Scan Result Unknown 43     Assessment & Plan:    1. Nighttime Incontinence Worsening We will discontinue the PTNS treatments at this time.  She will continue her Myrbetriq 50 mg daily.  I have also reminded her that consistent use of the CPAP machine is vital in order to help with nighttime urinary issues.  She will also stop fluid at 6 PM and only use a swallow of water to take nighttime medications.  I will also add Ditropan XL 10 mg daily to her medications.  She will return in 6 weeks for OAB questionnaire and PVR.  2. Nocturia See above  3. Urgency See above    Return in about 6 weeks (around 04/16/2019) for PVR and OAB questionnaire.  These notes generated with voice recognition software. I apologize for typographical errors.  Zara Council, PA-C  [facility] [address] [place], [place] [address] [phone]

## 2019-03-05 ENCOUNTER — Encounter: Payer: Self-pay | Admitting: Urology

## 2019-03-05 ENCOUNTER — Ambulatory Visit: Payer: Medicare HMO | Admitting: Urology

## 2019-03-05 ENCOUNTER — Other Ambulatory Visit: Payer: Self-pay

## 2019-03-05 VITALS — BP 169/90 | HR 94 | Ht 60.0 in

## 2019-03-05 DIAGNOSIS — N3944 Nocturnal enuresis: Secondary | ICD-10-CM | POA: Diagnosis not present

## 2019-03-05 DIAGNOSIS — R351 Nocturia: Secondary | ICD-10-CM | POA: Diagnosis not present

## 2019-03-05 DIAGNOSIS — R3915 Urgency of urination: Secondary | ICD-10-CM

## 2019-03-05 MED ORDER — MIRABEGRON ER 50 MG PO TB24: 50.0000 mg | ORAL_TABLET | Freq: Every day | ORAL | 3 refills | Status: DC

## 2019-03-05 MED ORDER — OXYBUTYNIN CHLORIDE ER 10 MG PO TB24: 10.0000 mg | ORAL_TABLET | Freq: Every day | ORAL | 3 refills | Status: DC

## 2019-03-24 ENCOUNTER — Other Ambulatory Visit: Payer: Self-pay | Admitting: Gastroenterology

## 2019-03-30 ENCOUNTER — Ambulatory Visit: Payer: Medicare HMO

## 2019-04-04 ENCOUNTER — Ambulatory Visit: Payer: Medicare HMO | Attending: Internal Medicine

## 2019-04-04 DIAGNOSIS — Z23 Encounter for immunization: Secondary | ICD-10-CM | POA: Insufficient documentation

## 2019-04-04 NOTE — Progress Notes (Signed)
   Covid-19 Vaccination Clinic  Name:  Madeline Martinez    MRN: [id number] DOB: [birth year]  04/04/2019  Ms. Neighbors was observed post Covid-19 immunization for 15 minutes without incidence. She was provided with Vaccine Information Sheet and instruction to access the V-Safe system.   [name] was instructed to call 911 with any severe reactions post vaccine: Marland Kitchen Difficulty breathing  . Swelling of your face and throat  . A fast heartbeat  . A bad rash all over your body  . Dizziness and weakness    Immunizations Administered    Name Date Dose VIS Date Route   Pfizer COVID-19 Vaccine 04/04/2019 12:00 PM 0.3 mL 02/06/2019 Intramuscular   Manufacturer: [place]   Lot: [id number]   [place]: [id number]

## 2019-04-07 ENCOUNTER — Telehealth: Payer: Self-pay

## 2019-04-07 NOTE — Telephone Encounter (Signed)
Called pt and pt's daughter to schedule pt's repeat upper endoscopy as recommended by Dr. Vicente Males.  Unable to contact. LVM to return call

## 2019-04-07 NOTE — Telephone Encounter (Signed)
-----   Message from Rushie Chestnut, [place] sent at 11/21/2018 12:25 PM EDT ----- Regarding: Pt due for repeat endoscopy Call pt to schedule repeat EGD (to check for polyp regrowth)  due this month

## 2019-04-14 ENCOUNTER — Other Ambulatory Visit: Payer: Self-pay

## 2019-04-14 DIAGNOSIS — K317 Polyp of stomach and duodenum: Secondary | ICD-10-CM

## 2019-04-14 NOTE — Telephone Encounter (Signed)
Spoke with pt and was able to schedule her procedure. Pt is aware she'll receive her prep instructions via mail.

## 2019-04-15 ENCOUNTER — Ambulatory Visit: Payer: Medicare HMO | Admitting: Urology

## 2019-04-28 ENCOUNTER — Ambulatory Visit: Payer: Medicare HMO | Attending: Internal Medicine

## 2019-04-28 DIAGNOSIS — Z23 Encounter for immunization: Secondary | ICD-10-CM | POA: Insufficient documentation

## 2019-04-28 NOTE — Progress Notes (Signed)
   Covid-19 Vaccination Clinic  Name:  Madeline Martinez    MRN: [id number] DOB: 11-Feb-1946  04/28/2019  [name] was observed post Covid-19 immunization for 15 minutes without incident. She was provided with Vaccine Information Sheet and instruction to access the V-Safe system.   [name] was instructed to call 911 with any severe reactions post vaccine: Marland Kitchen Difficulty breathing  . Swelling of face and throat  . A fast heartbeat  . A bad rash all over body  . Dizziness and weakness   Immunizations Administered    Name Date Dose VIS Date Route   Pfizer COVID-19 Vaccine 04/28/2019 10:12 AM 0.3 mL 02/06/2019 Intramuscular   Manufacturer: [place]   Lot: [id number]   [place]: [id number]

## 2019-04-29 ENCOUNTER — Ambulatory Visit: Payer: Medicare HMO

## 2019-05-07 ENCOUNTER — Other Ambulatory Visit: Admission: RE | Admit: 2019-05-07 | Payer: Medicare HMO | Source: Ambulatory Visit

## 2019-05-07 ENCOUNTER — Telehealth: Payer: Self-pay

## 2019-05-07 NOTE — Telephone Encounter (Signed)
Returned patients call in regards to rescheduling her 05/11/19 EGD with Dr. Vicente Males due to having eye problems she's experiencing.  LVM for her to call me back to reschedule.  Thanks,  [place], [place]

## 2019-05-08 ENCOUNTER — Telehealth: Payer: Self-pay

## 2019-05-08 NOTE — Telephone Encounter (Signed)
/  Contacted patient in regard to rescheduling her EGD.  EGD has been rescheduled to 06/15/19 with Dr. Vicente Males still.  New instructions will be sent to her to reflect date change.  [name] in Endo notified.  Thanks,  [place], [place]

## 2019-06-04 ENCOUNTER — Encounter: Payer: Self-pay | Admitting: Internal Medicine

## 2019-06-11 ENCOUNTER — Telehealth: Payer: Self-pay

## 2019-06-11 NOTE — Telephone Encounter (Signed)
Patient called to cancel procedure for 06/15/19. I saw a procedure scheduled with Dr. Vicente Males on 10/15/19. Please advise. Is the date showing incorrectly the system?

## 2019-06-16 ENCOUNTER — Other Ambulatory Visit: Payer: Self-pay | Admitting: Gastroenterology

## 2019-06-26 DIAGNOSIS — N1831 Chronic kidney disease, stage 3a: Secondary | ICD-10-CM | POA: Insufficient documentation

## 2019-09-01 ENCOUNTER — Other Ambulatory Visit: Payer: Self-pay | Admitting: Gastroenterology

## 2019-09-29 ENCOUNTER — Telehealth: Payer: Self-pay

## 2019-09-29 NOTE — Telephone Encounter (Signed)
Patient contacted the office to cancel and reschedule her EGD that was scheduled 10/15/19.  I informed her that she had already canceled the procedure.  She said she has to go out of town to see a sick family member.  Patient has not been seen since 09/11/18.  I explained to her that I would like to check with Dr. Vicente Males to see if he would like to see her in the office before we rescheduled her repeat EGD-since it has been a little over a year.  I made her aware that Dr. Vicente Males is on vacation currently and I will call her back to let her know what Dr. Vicente Males advises just as soon as he's back from vacation.  She said this is fine since she will be going out of town as well.  Please advise as to if she needs a follow up office visit prior to rescheduling her EGD.    Thanks,  [place], [place]

## 2019-10-05 NOTE — Telephone Encounter (Signed)
Can reschedule EGD at her convinience unless medical history has changed

## 2019-10-15 ENCOUNTER — Encounter: Admission: RE | Payer: Self-pay | Source: Ambulatory Visit

## 2019-10-15 ENCOUNTER — Ambulatory Visit: Admission: RE | Admit: 2019-10-15 | Payer: Medicare HMO | Source: Ambulatory Visit | Admitting: Gastroenterology

## 2019-10-15 SURGERY — ESOPHAGOGASTRODUODENOSCOPY (EGD) WITH PROPOFOL
Anesthesia: Choice

## 2019-11-16 ENCOUNTER — Other Ambulatory Visit: Payer: Self-pay | Admitting: Internal Medicine

## 2019-11-16 DIAGNOSIS — R0609 Other forms of dyspnea: Secondary | ICD-10-CM

## 2019-11-17 ENCOUNTER — Other Ambulatory Visit: Payer: Self-pay

## 2019-11-17 ENCOUNTER — Ambulatory Visit (INDEPENDENT_AMBULATORY_CARE_PROVIDER_SITE_OTHER): Payer: Medicare HMO | Admitting: Internal Medicine

## 2019-11-17 DIAGNOSIS — R0609 Other forms of dyspnea: Secondary | ICD-10-CM

## 2019-11-17 DIAGNOSIS — R0600 Dyspnea, unspecified: Secondary | ICD-10-CM | POA: Diagnosis not present

## 2019-11-17 LAB — PULMONARY FUNCTION TEST
DL/VA % pred: 99 %
DL/VA: 4.23 ml/min/mmHg/L
DLCO cor % pred: 84 %
DLCO cor: 14.18 ml/min/mmHg
DLCO unc % pred: 84 %
DLCO unc: 14.18 ml/min/mmHg
FEF 25-75 Post: 1.02 L/sec
FEF 25-75 Pre: 1.27 L/sec
FEF2575-%Change-Post: -19 %
FEF2575-%Pred-Post: 78 %
FEF2575-%Pred-Pre: 97 %
FEV1-%Change-Post: -6 %
FEV1-%Pred-Post: 77 %
FEV1-%Pred-Pre: 83 %
FEV1-Post: 1.1 L
FEV1-Pre: 1.17 L
FEV1FVC-%Change-Post: 5 %
FEV1FVC-%Pred-Pre: 110 %
FEV6-%Change-Post: -10 %
FEV6-%Pred-Post: 70 %
FEV6-%Pred-Pre: 79 %
FEV6-Post: 1.23 L
FEV6-Pre: 1.38 L
FEV6FVC-%Pred-Post: 105 %
FEV6FVC-%Pred-Pre: 105 %
FVC-%Change-Post: -10 %
FVC-%Pred-Post: 67 %
FVC-%Pred-Pre: 75 %
FVC-Post: 1.23 L
Post FEV1/FVC ratio: 89 %
Post FEV6/FVC ratio: 100 %
Pre FEV1/FVC ratio: 85 %
Pre FEV6/FVC Ratio: 100 %
RV % pred: 73 %
RV: 1.5 L
TLC % pred: 77 %
TLC: 3.47 L

## 2019-11-17 NOTE — Progress Notes (Signed)
PFT done today. 

## 2019-12-08 ENCOUNTER — Ambulatory Visit: Payer: Medicare HMO | Attending: Critical Care Medicine

## 2019-12-08 DIAGNOSIS — Z23 Encounter for immunization: Secondary | ICD-10-CM

## 2019-12-08 NOTE — Progress Notes (Signed)
° °  Covid-19 Vaccination Clinic  Name:  Raylinn Kosar    MRN: [id number] DOB: 1945-07-03  12/08/2019  Ms. Schlafer was observed post Covid-19 immunization for 15 minutes without incident. She was provided with Vaccine Information Sheet and instruction to access the V-Safe system.   Ms. Rinn was instructed to call 911 with any severe reactions post vaccine:  Difficulty breathing   Swelling of face and throat   A fast heartbeat   A bad rash all over body   Dizziness and weakness

## 2020-01-14 ENCOUNTER — Other Ambulatory Visit
Admission: RE | Admit: 2020-01-14 | Discharge: 2020-01-14 | Disposition: A | Discharge status: Home / Self Care | Payer: Medicare HMO | Source: Ambulatory Visit | Attending: Pulmonary Disease | Admitting: Pulmonary Disease

## 2020-01-14 DIAGNOSIS — J9801 Acute bronchospasm: Secondary | ICD-10-CM | POA: Insufficient documentation

## 2020-01-14 DIAGNOSIS — R0602 Shortness of breath: Secondary | ICD-10-CM | POA: Diagnosis present

## 2020-01-14 LAB — FIBRIN DERIVATIVES D-DIMER (ARMC ONLY): Fibrin derivatives D-dimer (ARMC): 325.24 ng/mL (FEU) (ref 0.00–499.00)

## 2020-02-04 ENCOUNTER — Other Ambulatory Visit: Payer: Self-pay | Admitting: Urology

## 2020-02-09 NOTE — Progress Notes (Signed)
02/10/2020 2:32 PM   Madeline Martinez Apr 15, 1945 [id number]  Referring provider: [name], [place],  [place] [address]  Chief Complaint  Patient presents with  . Over Active Bladder    HPI: Patient is a 74 year old female with nighttime incontinence and nocturia who presents today for follow up.    OAB The patient is  experiencing urgency x 0-3, frequency x 4-7, not restricting fluids to avoid visits to the restroom, is engaging in toilet mapping, incontinence x 0-3 and nocturia x 5.   Her BP is 155/84.   Her PVR is 12 mL.    Patient denies any modifying or aggravating factors.  Patient denies any gross hematuria, dysuria or suprapubic/flank pain.  Patient denies any fevers, chills, nausea or vomiting.   She is not sleeping with her CPAP machine, but she is seeing her pulmonologist tomorrow.     PMH: Past Medical History:  Diagnosis Date  . Acid reflux   . Anxiety   . Depression   . Diabetes mellitus   . Dysrhythmia   . Gout   . Heart murmur   . HTN (hypertension)   . Hyperlipidemia   . Sleep apnea     Surgical History: Past Surgical History:  Procedure Laterality Date  . ABDOMINAL HYSTERECTOMY    . BACK SURGERY  2007   DISCS  . BIOPSY  10/08/2018   Procedure: BIOPSY;  Surgeon: Irving Copas., MD;  Location: [facility] END[facility]COPY;  Service: Gastroenterology;;  . COLONOSCOPY WITH PROPOFOL N/A 12/19/2017   Procedure: COLONOSCOPY WITH PROPOFOL;  Surgeon: Jonathon Bellows, MD;  Location: [facility] ENDOSCOPY;  Service: Gastroenterology;  Laterality: N/A;  . 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 ENDOSCOPIC MUCOSAL RESECTION N/A 10/08/2018   Procedure: ENDOSCOPIC MUCOSAL RESECTION;  Surgeon: Rush Landmark Telford Nab., MD;  Location: [facility] ENDOSCOPY;  Service: Gastroenterology;  Laterality: N/A;  . ESOPHAGOGASTRODUODENOSCOPY (EGD) WITH PROPOFOL N/A 08/28/2018   Procedure: ESOPHAGOGASTRODUODENOSCOPY (EGD) WITH PROPOFOL;  Surgeon: Jonathon Bellows, MD;   Location: [facility] ENDOSCOPY;  Service: Gastroenterology;  Laterality: N/A;  . ESOPHAGOGASTRODUODENOSCOPY (EGD) WITH PROPOFOL N/A 10/08/2018   Procedure: ESOPHAGOGASTRODUODENOSCOPY (EGD) WITH PROPOFOL;  Surgeon: Rush Landmark Telford Nab., MD;  Location: [facility] ENDOSCOPY;  Service: Gastroenterology;  Laterality: N/A;  . EUS N/A 10/08/2018   Procedure: UPPER ENDOSCOPIC ULTRASOUND (EUS) RADIAL;  Surgeon: Rush Landmark Telford Nab., MD;  Location: [facility] ENDOSCOPY;  Service: Gastroenterology;  Laterality: N/A;  . PARTIAL HYSTERECTOMY  1986  . POLYPECTOMY  10/08/2018   Procedure: POLYPECTOMY;  Surgeon: Mansouraty, Telford Nab., MD;  Location: [facility] ENDOSCOPY;  Service: Gastroenterology;;    Home Medications:  Allergies as of 02/10/2020   No Known Allergies     Medication List       Accurate as of February 10, 2020  2:32 PM. If you have any questions, ask your nurse or doctor.        allopurinol 100 MG tablet Commonly known as: ZYLOPRIM Take 100 mg by mouth daily with lunch.   aspirin 81 MG EC tablet Take by mouth.   atorvastatin 80 MG tablet Commonly known as: LIPITOR Take 80 mg by mouth daily.   Biotin 5000 MCG Tabs Take 5,000 mcg by mouth daily. HAIR/SKIN/NAILS   carvedilol 12.5 MG tablet Commonly known as: COREG Take 12.5 mg by mouth 2 (two) times daily.   cetirizine 10 MG tablet Commonly known as: ZYRTEC Take 10 mg by mouth daily.   Dexilant 60 MG capsule Generic drug: dexlansoprazole Take 1 capsule by mouth  daily.   ferrous sulfate 325 (65 FE) MG tablet Take 325 mg by mouth daily.   FLUoxetine 40 MG capsule Commonly known as: PROZAC Take 40 mg by mouth daily.   gabapentin 400 MG capsule Commonly known as: NEURONTIN Take 400 mg by mouth daily.   glipiZIDE 5 MG 24 hr tablet Commonly known as: GLUCOTROL XL Take 5 mg by mouth 3 (three) times daily.   hydrochlorothiazide 25 MG tablet Commonly known as: HYDRODIURIL Take 25 mg by mouth daily.   HYDROcodone-acetaminophen 5-325 MG  tablet Commonly known as: NORCO/VICODIN Take 1 tablet by mouth daily as needed (pain.).   levothyroxine 25 MCG tablet Commonly known as: SYNTHROID Take 25 mcg by mouth daily.   Linzess 290 MCG Caps capsule Generic drug: linaclotide TAKE 1 CAPSULE (290 MCG TOTAL) BY MOUTH DAILY BEFORE BREAKFAST.   LORazepam 1 MG tablet Commonly known as: ATIVAN Take 1 mg by mouth 4 (four) times daily.   losartan 100 MG tablet Commonly known as: COZAAR Take 100 mg by mouth daily.   meloxicam 15 MG tablet Commonly known as: MOBIC Take 15 mg by mouth daily.   metFORMIN 1000 MG tablet Commonly known as: GLUCOPHAGE Take 1,000 mg by mouth 2 (two) times daily.   methocarbamol 750 MG tablet Commonly known as: ROBAXIN Take 750 mg by mouth daily with lunch.   mirabegron ER 50 MG Tb24 tablet Commonly known as: MYRBETRIQ Take 1 tablet (50 mg total) by mouth daily.   mirtazapine 15 MG tablet Commonly known as: REMERON Take 15 mg by mouth at bedtime.   omeprazole 20 MG capsule Commonly known as: PRILOSEC TAKE 2 CAPSULES (40 MG TOTAL) BY MOUTH 2 (TWO) TIMES DAILY BEFORE A MEAL.   oxybutynin 10 MG 24 hr tablet Commonly known as: DITROPAN-XL Take 1 tablet (10 mg total) by mouth daily.   pregabalin 75 MG capsule Commonly known as: LYRICA Take 75 mg by mouth 3 (three) times daily.   PROBIOTIC PO Take 1 tablet by mouth daily.   Toujeo SoloStar 300 UNIT/ML Solostar Pen Generic drug: insulin glargine (1 Unit Dial) Inject 20 Units into the skin daily.   Vitamin D-3 125 MCG (5000 UT) Tabs Take 5,000 Units by mouth daily.   vitamin E 45 MG (100 UNITS) capsule Take by mouth.       Allergies: No Known Allergies  Family History: Family History  Problem Relation Age of Onset  . Heart failure Father   . Breast cancer Mother   . Other Sister        DEGEN.DISC DISEASE  . Kidney cancer Neg Hx   . Kidney disease Neg Hx   . Prostate cancer Neg Hx     Social History:  reports that she has  never smoked. She has never used smokeless tobacco. She reports that she does not drink alcohol and does not use drugs.  ROS: For pertinent review of systems please refer to history of present illness  Physical Exam: BP (!) 155/84 (BP Location: Left Arm, Patient Position: Sitting, Cuff Size: Normal)   Pulse (!) 101   Ht 5\' 1"  (1.549 m)   Wt 219 lb (99.3 kg)   BMI 41.38 kg/m   Constitutional:  Well nourished. Alert and oriented, No acute distress. HEENT: [place] AT, mask in place.  Trachea midline Cardiovascular: No clubbing, cyanosis, or edema. Respiratory: Normal respiratory effort, no increased work of breathing. Neurologic: Grossly intact, no focal deficits, moving all 4 extremities.  In wheel chair.   Psychiatric: Normal  mood and affect.   Laboratory Data: Lab Results  Component Value Date   WBC 6.4 04/06/2013   HGB 13.5 04/06/2013   HCT 40.5 04/06/2013   MCV 93.3 04/06/2013   PLT 389 04/06/2013    Lab Results  Component Value Date   CREATININE 0.78 04/06/2013       Component Value Date/Time   CHOL 217 (A) 02/14/2010 0000   HDL 61 02/14/2010 0000   LDLCALC 135 02/14/2010 0000    Lab Results  Component Value Date   AST 31 04/06/2013   Lab Results  Component Value Date   ALT 28 04/06/2013   Urinalysis Color, Urine YELLOW YELLOW    Appearance Urine CLEAR CLEAR    Specific Gravity, UA 1.001 - 1.035 1.005    pH Urine 5.0 - 8.0 7.0    Glucose, Ur NEGATIVE NEGATIVE    Bilirubin, Urine NEGATIVE NEGATIVE    Ketones, Urine NEGATIVE NEGATIVE    Hemoglobin Ur Ql Strip NEGATIVE NEGATIVE    Protein, Ur NEGATIVE NEGATIVE    Nitrite, Urine NEGATIVE NEGATIVE    WBC Esterase Urine NEGATIVE NEGATIVE    WBC, Urine < OR = 5 /HPF NONE SEEN    RBC, Urine < OR = 2 /HPF NONE SEEN    Epithelial Cells in Urine < OR = 5 /HPF NONE SEEN    Trans Epithelial, Urine < OR = 5 /HPF CANCELED  Result canceled by the ancillary.  Renal Epithelial Cells, Urine < OR = 3 /HPF CANCELED  Result  canceled by the ancillary.  Bacteria NONE SEEN /HPF NONE SEEN    Calcium Oxalate Crystals, Urine NONE OR FEW /HPF CANCELED  Result canceled by the ancillary.  Triple Phosphate Crystals, Urine NONE OR FEW /HPF CANCELED  Result canceled by the ancillary.  Uric Acid Crystals, Urine NONE OR FEW /HPF CANCELED  Result canceled by the ancillary.  Amorphous Sediments NONE OR FEW /HPF CANCELED  Result canceled by the ancillary.  Crystals NONE SEEN /HPF CANCELED  Result canceled by the ancillary.  Hyaline Casts, Urine NONE SEEN /LPF NONE SEEN    Granular Casts, Urine NONE SEEN /LPF CANCELED  Result canceled by the ancillary.  Casts NONE SEEN /LPF CANCELED  Result canceled by the ancillary.  Yeast, UA NONE SEEN /HPF CANCELED  Result canceled by the ancillary.  Comments  CANCELED  Result canceled by the ancillary.  Note:  CANCELED  Result canceled by the ancillary.  Resulting Agency  [id number]    Resulting Agency Comment  Performing Organization Information:   Site ID: [id number]   Name: [facility]   Address: [address] [place], [place] [address]   Director: Cathleen Fears [place] Specimen Collected: 11/11/19 10:53 AM Last Resulted: 11/12/19 5:18 PM  Received From: [facility]  Result Received: 11/16/19 4:31 PM     I have reviewed the labs.  Pertinent Imaging: Results for INETTA, DICKE (MRN [id number]) as of 02/10/2020 09:54  Ref. Range 02/10/2020 09:23  Scan Result Unknown 33mL   Assessment & Plan:    1. Nighttime Incontinence Continue the oxybutynin and Myrbetriq  Explained how sleep apnea contributes to night time urination and encouraged her to get with her pulmonologist so that she can resume her CPAP machine  She is also going to have a consultation with Bariatric surgery as her PCP has recommended her to lose weight which will also help her urinary issues RTC in one year for OAB questionnaire and PVR  2. Nocturia See above  3.  Urgency See above   Return in about 1 year (around 02/09/2021) for PVR and OAB questionnaire.  These notes generated with voice recognition software. I apologize for typographical errors.  Zara Council, PA-C  [facility] [address] [place], [place] [address] [phone]

## 2020-02-10 ENCOUNTER — Ambulatory Visit: Payer: Medicare HMO | Admitting: Urology

## 2020-02-10 ENCOUNTER — Encounter: Payer: Self-pay | Admitting: Urology

## 2020-02-10 ENCOUNTER — Other Ambulatory Visit: Payer: Self-pay

## 2020-02-10 VITALS — BP 155/84 | HR 101 | Ht 61.0 in | Wt 219.0 lb

## 2020-02-10 DIAGNOSIS — N3944 Nocturnal enuresis: Secondary | ICD-10-CM | POA: Diagnosis not present

## 2020-02-10 LAB — BLADDER SCAN AMB NON-IMAGING

## 2020-02-10 MED ORDER — OXYBUTYNIN CHLORIDE ER 10 MG PO TB24: 10.0000 mg | ORAL_TABLET | Freq: Every day | ORAL | 3 refills | Status: DC

## 2020-02-10 MED ORDER — MIRABEGRON ER 50 MG PO TB24: 50.0000 mg | ORAL_TABLET | Freq: Every day | ORAL | 3 refills | Status: DC

## 2020-02-11 ENCOUNTER — Other Ambulatory Visit: Payer: Self-pay | Admitting: Pulmonary Disease

## 2020-02-11 DIAGNOSIS — R0600 Dyspnea, unspecified: Secondary | ICD-10-CM

## 2020-02-29 ENCOUNTER — Ambulatory Visit: Admission: RE | Admit: 2020-02-29 | Payer: Medicare HMO | Source: Ambulatory Visit

## 2020-03-01 ENCOUNTER — Other Ambulatory Visit: Payer: Self-pay | Admitting: Gastroenterology

## 2020-03-16 ENCOUNTER — Ambulatory Visit: Admission: RE | Admit: 2020-03-16 | Payer: Medicare HMO | Source: Ambulatory Visit

## 2020-03-22 ENCOUNTER — Ambulatory Visit: Payer: Medicare HMO | Attending: Pulmonary Disease

## 2020-04-01 ENCOUNTER — Emergency Department (HOSPITAL_COMMUNITY): Payer: BLUE CROSS/BLUE SHIELD

## 2020-04-01 ENCOUNTER — Other Ambulatory Visit: Payer: Self-pay

## 2020-04-01 ENCOUNTER — Emergency Department (HOSPITAL_COMMUNITY)
Admission: EM | Admit: 2020-04-01 | Discharge: 2020-04-01 | Disposition: A | Discharge status: Home / Self Care | Payer: BLUE CROSS/BLUE SHIELD | Attending: Emergency Medicine | Admitting: Emergency Medicine

## 2020-04-01 DIAGNOSIS — I10 Essential (primary) hypertension: Secondary | ICD-10-CM | POA: Insufficient documentation

## 2020-04-01 DIAGNOSIS — S299XXA Unspecified injury of thorax, initial encounter: Secondary | ICD-10-CM | POA: Diagnosis present

## 2020-04-01 DIAGNOSIS — Y92410 Unspecified street and highway as the place of occurrence of the external cause: Secondary | ICD-10-CM | POA: Diagnosis not present

## 2020-04-01 DIAGNOSIS — E119 Type 2 diabetes mellitus without complications: Secondary | ICD-10-CM | POA: Diagnosis not present

## 2020-04-01 DIAGNOSIS — R519 Headache, unspecified: Secondary | ICD-10-CM | POA: Diagnosis not present

## 2020-04-01 DIAGNOSIS — S20211A Contusion of right front wall of thorax, initial encounter: Secondary | ICD-10-CM

## 2020-04-01 DIAGNOSIS — Z7982 Long term (current) use of aspirin: Secondary | ICD-10-CM | POA: Insufficient documentation

## 2020-04-01 DIAGNOSIS — Z79899 Other long term (current) drug therapy: Secondary | ICD-10-CM | POA: Insufficient documentation

## 2020-04-01 DIAGNOSIS — Z7984 Long term (current) use of oral hypoglycemic drugs: Secondary | ICD-10-CM | POA: Insufficient documentation

## 2020-04-01 DIAGNOSIS — S301XXA Contusion of abdominal wall, initial encounter: Secondary | ICD-10-CM | POA: Insufficient documentation

## 2020-04-01 LAB — COMPREHENSIVE METABOLIC PANEL
ALT: 32 U/L (ref 0–44)
AST: 34 U/L (ref 15–41)
Albumin: 3.8 g/dL (ref 3.5–5.0)
Alkaline Phosphatase: 62 U/L (ref 38–126)
Anion gap: 13 (ref 5–15)
BUN: 26 mg/dL — ABNORMAL HIGH (ref 8–23)
CO2: 21 mmol/L — ABNORMAL LOW (ref 22–32)
Calcium: 9.4 mg/dL (ref 8.9–10.3)
Chloride: 104 mmol/L (ref 98–111)
Creatinine, Ser: 1.09 mg/dL — ABNORMAL HIGH (ref 0.44–1.00)
GFR, Estimated: 54 mL/min — ABNORMAL LOW (ref >60)
Glucose, Bld: 84 mg/dL (ref 70–99)
Potassium: 5.1 mmol/L (ref 3.5–5.1)
Sodium: 138 mmol/L (ref 135–145)
Total Bilirubin: 0.5 mg/dL (ref 0.3–1.2)
Total Protein: 7.2 g/dL (ref 6.5–8.1)

## 2020-04-01 LAB — URINALYSIS, ROUTINE W REFLEX MICROSCOPIC
Bilirubin Urine: NEGATIVE
Glucose, UA: NEGATIVE mg/dL
Hgb urine dipstick: NEGATIVE
Ketones, ur: NEGATIVE mg/dL
Leukocytes,Ua: NEGATIVE
Nitrite: NEGATIVE
Protein, ur: NEGATIVE mg/dL
Specific Gravity, Urine: >1.046 — ABNORMAL HIGH (ref 1.005–1.030)
pH: 6 (ref 5.0–8.0)

## 2020-04-01 LAB — CBC
HCT: 39.2 % (ref 36.0–46.0)
Hemoglobin: 12 g/dL (ref 12.0–15.0)
MCH: 29.4 pg (ref 26.0–34.0)
MCHC: 30.6 g/dL (ref 30.0–36.0)
MCV: 96.1 fL (ref 80.0–100.0)
Platelets: 368 10*3/uL (ref 150–400)
RBC: 4.08 MIL/uL (ref 3.87–5.11)
RDW: 16.7 % — ABNORMAL HIGH (ref 11.5–15.5)
WBC: 8.7 10*3/uL (ref 4.0–10.5)
nRBC: 0 % (ref 0.0–0.2)

## 2020-04-01 LAB — LACTIC ACID, PLASMA: Lactic Acid, Venous: 2.4 mmol/L (ref 0.5–1.9)

## 2020-04-01 LAB — I-STAT CHEM 8, ED
BUN: 30 mg/dL — ABNORMAL HIGH (ref 8–23)
Calcium, Ion: 1.18 mmol/L (ref 1.15–1.40)
Chloride: 105 mmol/L (ref 98–111)
Creatinine, Ser: 1.1 mg/dL — ABNORMAL HIGH (ref 0.44–1.00)
Glucose, Bld: 83 mg/dL (ref 70–99)
HCT: 39 % (ref 36.0–46.0)
Hemoglobin: 13.3 g/dL (ref 12.0–15.0)
Potassium: 5.1 mmol/L (ref 3.5–5.1)
Sodium: 139 mmol/L (ref 135–145)
TCO2: 27 mmol/L (ref 22–32)

## 2020-04-01 LAB — PROTIME-INR
INR: 1 (ref 0.8–1.2)
Prothrombin Time: 12.8 seconds (ref 11.4–15.2)

## 2020-04-01 MED ORDER — IOHEXOL 300 MG/ML  SOLN
100.0000 mL | Freq: Once | INTRAMUSCULAR | Status: AC | PRN
  Administered 2020-04-01: 100 mL via INTRAVENOUS

## 2020-04-01 MED ORDER — ONDANSETRON HCL 4 MG/2ML IJ SOLN
4.0000 mg | Freq: Once | INTRAMUSCULAR | Status: AC
  Administered 2020-04-01: 4 mg via INTRAVENOUS
  Filled 2020-04-01: qty 2

## 2020-04-01 MED ORDER — FENTANYL CITRATE (PF) 100 MCG/2ML IJ SOLN
100.0000 ug | Freq: Once | INTRAMUSCULAR | Status: AC
  Administered 2020-04-01: 100 ug via INTRAVENOUS
  Filled 2020-04-01: qty 2

## 2020-04-01 MED ORDER — FENTANYL CITRATE (PF) 100 MCG/2ML IJ SOLN
50.0000 ug | Freq: Once | INTRAMUSCULAR | Status: AC
  Administered 2020-04-01: 50 ug via INTRAVENOUS
  Filled 2020-04-01: qty 2

## 2020-04-01 MED ORDER — OXYCODONE-ACETAMINOPHEN 5-325 MG PO TABS: 1.0000 | ORAL_TABLET | ORAL | 0 refills | Status: DC | PRN

## 2020-04-01 MED ORDER — MORPHINE SULFATE (PF) 4 MG/ML IV SOLN
4.0000 mg | Freq: Once | INTRAVENOUS | Status: AC
  Administered 2020-04-01: 4 mg via INTRAVENOUS
  Filled 2020-04-01: qty 1

## 2020-04-01 MED ORDER — LIDOCAINE 5 % EX PTCH: 1.0000 | MEDICATED_PATCH | CUTANEOUS | 0 refills | Status: DC

## 2020-04-01 NOTE — ED Notes (Signed)
Pt to CT
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
## 2020-04-01 NOTE — ED Notes (Signed)
[place], MD speaking with pt about CT results at this time.
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
## 2020-04-17 ENCOUNTER — Other Ambulatory Visit: Payer: Self-pay | Admitting: Gastroenterology

## 2020-05-17 ENCOUNTER — Other Ambulatory Visit: Payer: Self-pay | Admitting: Internal Medicine

## 2020-05-17 DIAGNOSIS — N63 Unspecified lump in unspecified breast: Secondary | ICD-10-CM

## 2020-09-01 ENCOUNTER — Other Ambulatory Visit
Admission: RE | Admit: 2020-09-01 | Discharge: 2020-09-01 | Disposition: A | Discharge status: Home / Self Care | Payer: Medicare HMO | Source: Ambulatory Visit | Attending: Pulmonary Disease | Admitting: Pulmonary Disease

## 2020-09-01 DIAGNOSIS — J4540 Moderate persistent asthma, uncomplicated: Secondary | ICD-10-CM | POA: Insufficient documentation

## 2020-09-01 LAB — D-DIMER, QUANTITATIVE: D-Dimer, Quant: 0.27 ug/mL-FEU (ref 0.00–0.50)

## 2020-10-17 ENCOUNTER — Other Ambulatory Visit: Payer: Self-pay | Admitting: Gastroenterology

## 2020-11-15 ENCOUNTER — Other Ambulatory Visit: Payer: Self-pay | Admitting: Urology

## 2020-12-12 ENCOUNTER — Encounter: Payer: Medicare HMO | Attending: Pulmonary Disease | Admitting: *Deleted

## 2020-12-12 ENCOUNTER — Other Ambulatory Visit: Payer: Self-pay

## 2020-12-12 DIAGNOSIS — J45909 Unspecified asthma, uncomplicated: Secondary | ICD-10-CM | POA: Insufficient documentation

## 2020-12-12 NOTE — Progress Notes (Signed)
Completed virtual orientation today.  EP evaluation is scheduled for Thursday 10/20 at 8am.  Documentation for diagnosis can be found in CE encounter 11/24/20.

## 2020-12-15 ENCOUNTER — Other Ambulatory Visit: Payer: Self-pay

## 2020-12-15 ENCOUNTER — Ambulatory Visit
Admission: RE | Admit: 2020-12-15 | Discharge: 2020-12-15 | Disposition: A | Discharge status: Home / Self Care | Payer: BLUE CROSS/BLUE SHIELD | Source: Ambulatory Visit | Attending: Internal Medicine | Admitting: Internal Medicine

## 2020-12-15 VITALS — Ht 60.5 in | Wt 208.6 lb

## 2020-12-15 DIAGNOSIS — J45909 Unspecified asthma, uncomplicated: Secondary | ICD-10-CM

## 2020-12-15 DIAGNOSIS — N6311 Unspecified lump in the right breast, upper outer quadrant: Secondary | ICD-10-CM | POA: Diagnosis not present

## 2020-12-15 DIAGNOSIS — N63 Unspecified lump in unspecified breast: Secondary | ICD-10-CM | POA: Insufficient documentation

## 2020-12-15 NOTE — Progress Notes (Signed)
Pulmonary Individual Treatment Plan  Patient Details  Name: Madeline Martinez MRN: [id number] Date of Birth: [birth year] Referring Provider:   Flowsheet Row Pulmonary Rehab from 12/15/2020 in [facility] Cardiac and Pulmonary Rehab  Referring Provider Ottie Glazier MD       Initial Encounter Date:  Flowsheet Row Pulmonary Rehab from 12/15/2020 in [facility] Cardiac and Pulmonary Rehab  Date 12/15/20       Visit Diagnosis: Asthma, unspecified asthma severity, unspecified whether complicated, unspecified whether persistent  Patient's Home Medications on Admission:  Current Outpatient Medications:    allopurinol (ZYLOPRIM) 100 MG tablet, Take 100 mg by mouth daily with lunch. , Disp: , Rfl:    aspirin 81 MG EC tablet, Take by mouth., Disp: , Rfl:    atorvastatin (LIPITOR) 80 MG tablet, Take 80 mg by mouth daily., Disp: , Rfl:    Biotin 5000 MCG TABS, Take 5,000 mcg by mouth daily. HAIR/SKIN/NAILS, Disp: , Rfl:    carvedilol (COREG) 12.5 MG tablet, Take 12.5 mg by mouth 2 (two) times daily., Disp: , Rfl:    cetirizine (ZYRTEC) 10 MG tablet, Take 10 mg by mouth daily.  (Patient not taking: Reported on 12/12/2020), Disp: , Rfl:    Cholecalciferol (VITAMIN D-3) 125 MCG (5000 UT) TABS, Take 5,000 Units by mouth daily., Disp: , Rfl:    DEXILANT 60 MG capsule, Take 1 capsule by mouth daily. (Patient not taking: Reported on 12/12/2020), Disp: , Rfl:    ferrous sulfate 325 (65 FE) MG tablet, Take 325 mg by mouth daily., Disp: , Rfl:    FLUoxetine (PROZAC) 40 MG capsule, Take 40 mg by mouth daily., Disp: , Rfl:    gabapentin (NEURONTIN) 400 MG capsule, Take 400 mg by mouth daily. (Patient not taking: Reported on 12/12/2020), Disp: , Rfl:    glipiZIDE (GLUCOTROL XL) 5 MG 24 hr tablet, Take 5 mg by mouth 3 (three) times daily., Disp: , Rfl:    hydrochlorothiazide (HYDRODIURIL) 25 MG tablet, Take 25 mg by mouth daily., Disp: , Rfl:    HYDROcodone-acetaminophen (NORCO/VICODIN) 5-325 MG tablet, Take 1 tablet by  mouth daily as needed (pain.). , Disp: , Rfl:    levothyroxine (SYNTHROID) 25 MCG tablet, Take 25 mcg by mouth daily., Disp: , Rfl:    lidocaine (LIDODERM) 5 %, Place 1 patch onto the skin daily. Remove & Discard patch within 12 hours or as directed by MD, Disp: 15 patch, Rfl: 0   LINZESS 290 MCG CAPS capsule, TAKE 1 CAPSULE BY MOUTH DAILY BEFORE BREAKFAST., Disp: 90 capsule, Rfl: 1   LORazepam (ATIVAN) 1 MG tablet, Take 1 mg by mouth 4 (four) times daily. , Disp: , Rfl:    losartan (COZAAR) 100 MG tablet, Take 100 mg by mouth daily., Disp: , Rfl:    meloxicam (MOBIC) 15 MG tablet, Take 15 mg by mouth daily. (Patient not taking: No sig reported), Disp: , Rfl:    metFORMIN (GLUCOPHAGE) 1000 MG tablet, Take 1,000 mg by mouth 2 (two) times daily., Disp: , Rfl:    methocarbamol (ROBAXIN) 750 MG tablet, Take 750 mg by mouth daily with lunch.  (Patient not taking: No sig reported), Disp: , Rfl:    mirabegron ER (MYRBETRIQ) 50 MG TB24 tablet, Take 1 tablet (50 mg total) by mouth daily., Disp: 90 tablet, Rfl: 3   mirtazapine (REMERON) 15 MG tablet, Take 15 mg by mouth at bedtime. , Disp: , Rfl:    omeprazole (PRILOSEC) 20 MG capsule, TAKE 2 CAPSULES (40 MG TOTAL) BY MOUTH 2 (  TWO) TIMES DAILY BEFORE A MEAL., Disp: 180 capsule, Rfl: 1   oxybutynin (DITROPAN-XL) 10 MG 24 hr tablet, TAKE 1 TABLET BY MOUTH EVERY DAY, Disp: 90 tablet, Rfl: 3   oxyCODONE-acetaminophen (PERCOCET/ROXICET) 5-325 MG tablet, Take 1 tablet by mouth every 4 (four) hours as needed., Disp: 15 tablet, Rfl: 0   pregabalin (LYRICA) 75 MG capsule, Take 75 mg by mouth 3 (three) times daily., Disp: , Rfl:    Probiotic Product (PROBIOTIC PO), Take 1 tablet by mouth daily. (Patient not taking: Reported on 12/12/2020), Disp: , Rfl:    TOUJEO SOLOSTAR 300 UNIT/ML SOPN, Inject 20 Units into the skin daily. , Disp: , Rfl:    vitamin E 45 MG (100 UNITS) capsule, Take by mouth., Disp: , Rfl:   Past Medical History: Past Medical History:  Diagnosis  Date   Acid reflux    Anxiety    Depression    Diabetes mellitus    Dysrhythmia    Gout    Heart murmur    HTN (hypertension)    Hyperlipidemia    Sleep apnea     Tobacco Use: Social History   Tobacco Use  Smoking Status Never  Smokeless Tobacco Never    Labs: Recent Review Flowsheet Data     Labs for ITP Cardiac and Pulmonary Rehab Latest Ref Rng & Units 02/14/2010 04/01/2020   Cholestrol 0 - 200 mg/dL 217(A) -   LDLCALC mg/dL 135 -   HDL 35 - 70 mg/dL 61 -   Trlycerides 40 - 160 mg/dL 103 -   TCO2 22 - 32 mmol/L - 27        Pulmonary Assessment Scores:  Pulmonary Assessment Scores     Row Name 12/15/20 1427         ADL UCSD   ADL Phase Entry     SOB Score total 59     Rest 0     Walk 5     Stairs 5     Bath 0     Dress 5     Shop 4       CAT Score   CAT Score 37       mMRC Score   mMRC Score 3              UCSD: Self-administered rating of dyspnea associated with activities of daily living (ADLs) 6-point scale (0 = "not at all" to 5 = "maximal or unable to do because of breathlessness")  Scoring Scores range from 0 to 120.  Minimally important difference is 5 units  CAT: CAT can identify the health impairment of COPD patients and is better correlated with disease progression.  CAT has a scoring range of zero to 40. The CAT score is classified into four groups of low (less than 10), medium (10 - 20), high (21-30) and very high (31-40) based on the impact level of disease on health status. A CAT score over 10 suggests significant symptoms.  A worsening CAT score could be explained by an exacerbation, poor medication adherence, poor inhaler technique, or progression of COPD or comorbid conditions.  CAT MCID is 2 points  mMRC: mMRC (Modified Medical Research Council) Dyspnea Scale is used to assess the degree of baseline functional disability in patients of respiratory disease due to dyspnea. No minimal important difference is established. A  decrease in score of 1 point or greater is considered a positive change.   Pulmonary Function Assessment:   Exercise Target Goals: Exercise Program Goal:  Individual exercise prescription set using results from initial 6 min walk test and THRR while considering  patient's activity barriers and safety.   Exercise Prescription Goal: Initial exercise prescription builds to 30-45 minutes a day of aerobic activity, 2-3 days per week.  Home exercise guidelines will be given to patient during program as part of exercise prescription that the participant will acknowledge.  Education: Aerobic Exercise: - Group verbal and visual presentation on the components of exercise prescription. Introduces F.I.T.T principle from ACSM for exercise prescriptions.  Reviews F.I.T.T. principles of aerobic exercise including progression. Written material given at graduation.   Education: Resistance Exercise: - Group verbal and visual presentation on the components of exercise prescription. Introduces F.I.T.T principle from ACSM for exercise prescriptions  Reviews F.I.T.T. principles of resistance exercise including progression. Written material given at graduation.    Education: Exercise & Equipment Safety: - Individual verbal instruction and demonstration of equipment use and safety with use of the equipment. Flowsheet Row Pulmonary Rehab from 12/15/2020 in [facility] Cardiac and Pulmonary Rehab  Education need identified 12/15/20  Date 12/15/20  Educator [place]  Instruction Review Code 1- Verbalizes Understanding       Education: Exercise Physiology & General Exercise Guidelines: - Group verbal and written instruction with models to review the exercise physiology of the cardiovascular system and associated critical values. Provides general exercise guidelines with specific guidelines to those with heart or lung disease.    Education: Flexibility, Balance, Mind/Body Relaxation: - Group verbal and visual presentation  with interactive activity on the components of exercise prescription. Introduces F.I.T.T principle from ACSM for exercise prescriptions. Reviews F.I.T.T. principles of flexibility and balance exercise training including progression. Also discusses the mind body connection.  Reviews various relaxation techniques to help reduce and manage stress (i.e. Deep breathing, progressive muscle relaxation, and visualization). Balance handout provided to take home. Written material given at graduation.   Activity Barriers & Risk Stratification:  Activity Barriers & Cardiac Risk Stratification - 12/15/20 1425       Activity Barriers & Cardiac Risk Stratification   Activity Barriers Back Problems;Other (comment);Shortness of Breath;Muscular Weakness;Deconditioning;Joint Problems;Balance Concerns;History of Falls    Comments chronic back pain, hard for her to walk distance, occasional knee pain             6 Minute Walk:  6 Minute Walk     Row Name 12/15/20 1431         6 Minute Walk   Phase Initial     Distance 90 feet     Walk Time 4 minutes  Break 1:54-4:10     # of Rest Breaks 1     MPH 0.17     METS 0.1     RPE 15     Perceived Dyspnea  3     VO2 Peak 0.35     Symptoms Yes (comment)     Comments SOB, Right knee pain 5/10     Resting HR 89 bpm     Resting BP 140/70     Resting Oxygen Saturation  94 %     Exercise Oxygen Saturation  during 6 min walk 93 %     Max Ex. HR 102 bpm     Max Ex. BP 162/72     2 Minute Post BP 144/74       Interval HR   1 Minute HR 102     2 Minute HR 98     3 Minute HR 99     4  Minute HR 97     5 Minute HR 102     6 Minute HR 88     2 Minute Post HR 84     Interval Heart Rate? Yes       Interval Oxygen   Interval Oxygen? Yes     Baseline Oxygen Saturation % 94 %     1 Minute Oxygen Saturation % 93 %     1 Minute Liters of Oxygen 0 L  RA     2 Minute Oxygen Saturation % 95 %     2 Minute Liters of Oxygen 0 L     3 Minute Oxygen Saturation %  94 %     3 Minute Liters of Oxygen 0 L     4 Minute Oxygen Saturation % 97 %     4 Minute Liters of Oxygen 0 L     5 Minute Oxygen Saturation % 96 %     5 Minute Liters of Oxygen 0 L     6 Minute Oxygen Saturation % 95 %     6 Minute Liters of Oxygen 0 L     2 Minute Post Oxygen Saturation % 96 %     2 Minute Post Liters of Oxygen 0 L             Oxygen Initial Assessment:  Oxygen Initial Assessment - 12/15/20 1426       Home Oxygen   Home Oxygen Device None   currently working up oxygen therapy   Sleep Oxygen Prescription None   has CPAP on back order   Home Exercise Oxygen Prescription None   Working on getting oxygen   Home Resting Oxygen Prescription None      Initial 6 min Walk   Oxygen Used None      Program Oxygen Prescription   Program Oxygen Prescription Continuous   when available   Liters per minute 2      Intervention   Short Term Goals To learn and exhibit compliance with exercise, home and travel O2 prescription;To learn and understand importance of monitoring SPO2 with pulse oximeter and demonstrate accurate use of the pulse oximeter.;To learn and understand importance of maintaining oxygen saturations>88%;To learn and demonstrate proper pursed lip breathing techniques or other breathing techniques. ;To learn and demonstrate proper use of respiratory medications    Long  Term Goals Exhibits compliance with exercise, home  and travel O2 prescription;Verbalizes importance of monitoring SPO2 with pulse oximeter and return demonstration;Maintenance of O2 saturations>88%;Exhibits proper breathing techniques, such as pursed lip breathing or other method taught during program session;Compliance with respiratory medication;Demonstrates proper use of MDI's             Oxygen Re-Evaluation:   Oxygen Discharge (Final Oxygen Re-Evaluation):   Initial Exercise Prescription:  Initial Exercise Prescription - 12/15/20 1400       Date of Initial Exercise RX and  Referring Provider   Date 12/15/20    Referring Provider Ottie Glazier MD      Oxygen   Oxygen Continuous    Liters 2   when available   Maintain Oxygen Saturation 88% or higher      Recumbant Bike   Level 1    RPM 60    Minutes 15    METs 1      NuStep   Level 1    SPM 80    Minutes 15    METs 1      REL-XR  Level 1    Speed 50    Minutes 15    METs 1      Prescription Details   Frequency (times per week) 3    Duration Progress to 30 minutes of continuous aerobic without signs/symptoms of physical distress      Intensity   THRR 40-80% of Max Heartrate 111-134    Ratings of Perceived Exertion 11-13    Perceived Dyspnea 0-4      Progression   Progression Continue to progress workloads to maintain intensity without signs/symptoms of physical distress.      Resistance Training   Training Prescription Yes    Weight 2 lb    Reps 10-15             Perform Capillary Blood Glucose checks as needed.  Exercise Prescription Changes:   Exercise Prescription Changes     Row Name 12/15/20 1400             Response to Exercise   Blood Pressure (Admit) 140/70       Blood Pressure (Exercise) 162/72       Blood Pressure (Exit) 144/74       Heart Rate (Admit) 89 bpm       Heart Rate (Exercise) 102 bpm       Heart Rate (Exit) 84 bpm       Oxygen Saturation (Admit) 94 %       Oxygen Saturation (Exercise) 93 %       Oxygen Saturation (Exit) 96 %       Rating of Perceived Exertion (Exercise) 15       Perceived Dyspnea (Exercise) 3       Symptoms SOB, right knee pain 5/10       Comments walk test results                Exercise Comments:   Exercise Goals and Review:   Exercise Goals     Row Name 12/15/20 1436             Exercise Goals   Increase Physical Activity Yes       Intervention Provide advice, education, support and counseling about physical activity/exercise needs.;Develop an individualized exercise prescription for aerobic and  resistive training based on initial evaluation findings, risk stratification, comorbidities and participant's personal goals.       Expected Outcomes Short Term: Attend rehab on a regular basis to increase amount of physical activity.;Long Term: Add in home exercise to make exercise part of routine and to increase amount of physical activity.;Long Term: Exercising regularly at least 3-5 days a week.       Increase Strength and Stamina Yes       Intervention Provide advice, education, support and counseling about physical activity/exercise needs.;Develop an individualized exercise prescription for aerobic and resistive training based on initial evaluation findings, risk stratification, comorbidities and participant's personal goals.       Expected Outcomes Short Term: Increase workloads from initial exercise prescription for resistance, speed, and METs.;Short Term: Perform resistance training exercises routinely during rehab and add in resistance training at home;Long Term: Improve cardiorespiratory fitness, muscular endurance and strength as measured by increased METs and functional capacity (6MWT)       Able to understand and use rate of perceived exertion (RPE) scale Yes       Intervention Provide education and explanation on how to use RPE scale       Expected Outcomes Short Term: Able to use RPE  daily in rehab to express subjective intensity level;Long Term:  Able to use RPE to guide intensity level when exercising independently       Able to understand and use Dyspnea scale Yes       Intervention Provide education and explanation on how to use Dyspnea scale       Expected Outcomes Short Term: Able to use Dyspnea scale daily in rehab to express subjective sense of shortness of breath during exertion;Long Term: Able to use Dyspnea scale to guide intensity level when exercising independently       Knowledge and understanding of Target Heart Rate Range (THRR) Yes       Intervention Provide education and  explanation of THRR including how the numbers were predicted and where they are located for reference       Expected Outcomes Short Term: Able to state/look up THRR;Long Term: Able to use THRR to govern intensity when exercising independently;Short Term: Able to use daily as guideline for intensity in rehab       Able to check pulse independently Yes       Intervention Provide education and demonstration on how to check pulse in carotid and radial arteries.;Review the importance of being able to check your own pulse for safety during independent exercise       Expected Outcomes Short Term: Able to explain why pulse checking is important during independent exercise;Long Term: Able to check pulse independently and accurately       Understanding of Exercise Prescription Yes       Intervention Provide education, explanation, and written materials on patient's individual exercise prescription       Expected Outcomes Short Term: Able to explain program exercise prescription;Long Term: Able to explain home exercise prescription to exercise independently                Exercise Goals Re-Evaluation :   Discharge Exercise Prescription (Final Exercise Prescription Changes):  Exercise Prescription Changes - 12/15/20 1400       Response to Exercise   Blood Pressure (Admit) 140/70    Blood Pressure (Exercise) 162/72    Blood Pressure (Exit) 144/74    Heart Rate (Admit) 89 bpm    Heart Rate (Exercise) 102 bpm    Heart Rate (Exit) 84 bpm    Oxygen Saturation (Admit) 94 %    Oxygen Saturation (Exercise) 93 %    Oxygen Saturation (Exit) 96 %    Rating of Perceived Exertion (Exercise) 15    Perceived Dyspnea (Exercise) 3    Symptoms SOB, right knee pain 5/10    Comments walk test results             Nutrition:  Target Goals: Understanding of nutrition guidelines, daily intake of sodium 1500mg , cholesterol 200mg , calories 30% from fat and 7% or less from saturated fats, daily to have 5 or  more servings of fruits and vegetables.  Education: All About Nutrition: -Group instruction provided by verbal, written material, interactive activities, discussions, models, and posters to present general guidelines for heart healthy nutrition including fat, fiber, MyPlate, the role of sodium in heart healthy nutrition, utilization of the nutrition label, and utilization of this knowledge for meal planning. Follow up email sent as well. Written material given at graduation. Flowsheet Row Pulmonary Rehab from 12/15/2020 in [facility] Cardiac and Pulmonary Rehab  Education need identified 12/15/20       Biometrics:  Pre Biometrics - 12/15/20 1425       Pre Biometrics  Height 5' 0.5" (1.537 m)    Weight 208 lb 9.6 oz (94.6 kg)    BMI (Calculated) 40.05              Nutrition Therapy Plan and Nutrition Goals:  Nutrition Therapy & Goals - 12/12/20 1327       Intervention Plan   Intervention Prescribe, educate and counsel regarding individualized specific dietary modifications aiming towards targeted core components such as weight, hypertension, lipid management, diabetes, heart failure and other comorbidities.    Expected Outcomes Short Term Goal: Understand basic principles of dietary content, such as calories, fat, sodium, cholesterol and nutrients.;Short Term Goal: A plan has been developed with personal nutrition goals set during dietitian appointment.;Long Term Goal: Adherence to prescribed nutrition plan.             Nutrition Assessments:  MEDIFICTS Score Key: ?70 Need to make dietary changes  40-70 Heart Healthy Diet ? 40 Therapeutic Level Cholesterol Diet  Flowsheet Row Pulmonary Rehab from 12/15/2020 in [facility] Cardiac and Pulmonary Rehab  Picture Your Plate Total Score on Admission 49      Picture Your Plate Scores: <63 Unhealthy dietary pattern with much room for improvement. 41-50 Dietary pattern unlikely to meet recommendations for good health and room for  improvement. 51-60 More healthful dietary pattern, with some room for improvement.  >60 Healthy dietary pattern, although there may be some specific behaviors that could be improved.   Nutrition Goals Re-Evaluation:   Nutrition Goals Discharge (Final Nutrition Goals Re-Evaluation):   Psychosocial: Target Goals: Acknowledge presence or absence of significant depression and/or stress, maximize coping skills, provide positive support system. Participant is able to verbalize types and ability to use techniques and skills needed for reducing stress and depression.   Education: Stress, Anxiety, and Depression - Group verbal and visual presentation to define topics covered.  Reviews how body is impacted by stress, anxiety, and depression.  Also discusses healthy ways to reduce stress and to treat/manage anxiety and depression.  Written material given at graduation.   Education: Sleep Hygiene -Provides group verbal and written instruction about how sleep can affect your health.  Define sleep hygiene, discuss sleep cycles and impact of sleep habits. Review good sleep hygiene tips.    Initial Review & Psychosocial Screening:  Initial Psych Review & Screening - 12/12/20 1327       Initial Review   Current issues with Current Depression;Current Anxiety/Panic;History of Depression;Current Psychotropic Meds;Current Stress Concerns    Source of Stress Concerns Chronic Illness;Unable to participate in former interests or hobbies;Unable to perform yard/household activities    Comments getting worked up for oxygen therapy, on meds for both depression and anxiety (feels well managed)      [place]? Yes   two daughters (lives with one of them, other near by), sister also comes to help as needed     Barriers   Psychosocial barriers to participate in program The patient should benefit from training in stress management and relaxation.;Psychosocial barriers identified (see  note)      Screening Interventions   Interventions Encouraged to exercise;To provide support and resources with identified psychosocial needs;Provide feedback about the scores to participant    Expected Outcomes Short Term goal: Utilizing psychosocial counselor, staff and physician to assist with identification of specific Stressors or current issues interfering with healing process. Setting desired goal for each stressor or current issue identified.;Long Term Goal: Stressors or current issues are controlled or eliminated.;Short Term goal: Identification  and review with participant of any Quality of Life or Depression concerns found by scoring the questionnaire.;Long Term goal: The participant improves quality of Life and PHQ9 Scores as seen by post scores and/or verbalization of changes             Quality of Life Scores:  Scores of 19 and below usually indicate a poorer quality of life in these areas.  A difference of  2-3 points is a clinically meaningful difference.  A difference of 2-3 points in the total score of the Quality of Life Index has been associated with significant improvement in overall quality of life, self-image, physical symptoms, and general health in studies assessing change in quality of life.  PHQ-9: Recent Review Flowsheet Data     Depression screen [facility] 2/9 12/15/2020   Decreased Interest 2   Down, Depressed, Hopeless 0   PHQ - 2 Score 2   Altered sleeping 3   Tired, decreased energy 2   Change in appetite 3   Feeling bad or failure about yourself  0   Trouble concentrating 3   Moving slowly or fidgety/restless 2   Suicidal thoughts 0   PHQ-9 Score 15   Difficult doing work/chores Somewhat difficult      Interpretation of Total Score  Total Score Depression Severity:  1-4 = Minimal depression, 5-9 = Mild depression, 10-14 = Moderate depression, 15-19 = Moderately severe depression, 20-27 = Severe depression   Psychosocial Evaluation and Intervention:   Psychosocial Evaluation - 12/12/20 1342       Psychosocial Evaluation & Interventions   Interventions Encouraged to exercise with the program and follow exercise prescription;Stress management education    Comments Ms. Raulerson is coming into pulmonary rehab for asthma.  She has oxygen prescribed but they are still in the process of getting her set up for home oxygen therapy.  She was encouraged to also request a pulse oximeter to monitor her saturations at home.  Currently, she does not have any portable equipment, thus we will see how she does on her walk test without oxygen.  She has also had a sleep study completed that shows that she needs a CPAP but it is currently on back order.  She is eager to start rehab and hopes to learn her limitations and be able to breathe better.  She lives at home with one of her two daughters who help her as needed.  She also has a sister that lives within a few hours drive that also stops by to help when needed.  She has a long history of both depression and anxiety and is on medication for both.  She is currently feeling like her mood is well managed and does not feel that she has any current symptoms.  We will continue to check in with her on these.    Expected Outcomes Short: Attend rehab to learn limits and breathing techniques to help her manage life better Long: Continued compliance on medication and mood boost with exercise too    Continue Psychosocial Services  Follow up required by staff             Psychosocial Re-Evaluation:   Psychosocial Discharge (Final Psychosocial Re-Evaluation):   Education: Education Goals: Education classes will be provided on a weekly basis, covering required topics. Participant will state understanding/return demonstration of topics presented.  Learning Barriers/Preferences:  Learning Barriers/Preferences - 12/12/20 1324       Learning Barriers/Preferences   Learning Barriers Sight   contacts and  glasses, corneal  transplant   Learning Preferences None             General Pulmonary Education Topics:  Infection Prevention: - Provides verbal and written material to individual with discussion of infection control including proper hand washing and proper equipment cleaning during exercise session. Flowsheet Row Pulmonary Rehab from 12/15/2020 in [facility] Cardiac and Pulmonary Rehab  Education need identified 12/15/20  Date 12/15/20  Educator [place]  Instruction Review Code 1- Verbalizes Understanding       Falls Prevention: - Provides verbal and written material to individual with discussion of falls prevention and safety. Flowsheet Row Pulmonary Rehab from 12/15/2020 in [facility] Cardiac and Pulmonary Rehab  Education need identified 12/15/20  Date 12/15/20  Educator [place]  Instruction Review Code 1- Verbalizes Understanding       Chronic Lung Disease Review: - Group verbal instruction with posters, models, PowerPoint presentations and videos,  to review new updates, new respiratory medications, new advancements in procedures and treatments. Providing information on websites and "800" numbers for continued self-education. Includes information about supplement oxygen, available portable oxygen systems, continuous and intermittent flow rates, oxygen safety, concentrators, and Medicare reimbursement for oxygen. Explanation of Pulmonary Drugs, including class, frequency, complications, importance of spacers, rinsing mouth after steroid MDI's, and proper cleaning methods for nebulizers. Review of basic lung anatomy and physiology related to function, structure, and complications of lung disease. Review of risk factors. Discussion about methods for diagnosing sleep apnea and types of masks and machines for OSA. Includes a review of the use of types of environmental controls: home humidity, furnaces, filters, dust mite/pet prevention, HEPA vacuums. Discussion about weather changes, air quality and the benefits of  nasal washing. Instruction on Warning signs, infection symptoms, calling MD promptly, preventive modes, and value of vaccinations. Review of effective airway clearance, coughing and/or vibration techniques. Emphasizing that all should Create an Action Plan. Written material given at graduation. Flowsheet Row Pulmonary Rehab from 12/15/2020 in [facility] Cardiac and Pulmonary Rehab  Education need identified 12/15/20       AED/CPR: - Group verbal and written instruction with the use of models to demonstrate the basic use of the AED with the basic ABC's of resuscitation.    Anatomy and Cardiac Procedures: - Group verbal and visual presentation and models provide information about basic cardiac anatomy and function. Reviews the testing methods done to diagnose heart disease and the outcomes of the test results. Describes the treatment choices: Medical Management, Angioplasty, or Coronary Bypass Surgery for treating various heart conditions including Myocardial Infarction, Angina, Valve Disease, and Cardiac Arrhythmias.  Written material given at graduation.   Medication Safety: - Group verbal and visual instruction to review commonly prescribed medications for heart and lung disease. Reviews the medication, class of the drug, and side effects. Includes the steps to properly store meds and maintain the prescription regimen.  Written material given at graduation.   Other: -Provides group and verbal instruction on various topics (see comments)   Knowledge Questionnaire Score:  Knowledge Questionnaire Score - 12/15/20 1429       Knowledge Questionnaire Score   Pre Score 16/18: Lung disease, Oxygen              Core Components/Risk Factors/Patient Goals at Admission:  Personal Goals and Risk Factors at Admission - 12/15/20 1436       Core Components/Risk Factors/Patient Goals on Admission    Weight Management Yes;Weight Loss;Obesity    Intervention Weight Management: Develop a combined  nutrition and exercise  program designed to reach desired caloric intake, while maintaining appropriate intake of nutrient and fiber, sodium and fats, and appropriate energy expenditure required for the weight goal.;Weight Management: Provide education and appropriate resources to help participant work on and attain dietary goals.    Admit Weight 208 lb (94.3 kg)    Goal Weight: Short Term 203 lb (92.1 kg)    Goal Weight: Long Term 190 lb (86.2 kg)    Expected Outcomes Short Term: Continue to assess and modify interventions until short term weight is achieved;Long Term: Adherence to nutrition and physical activity/exercise program aimed toward attainment of established weight goal;Weight Loss: Understanding of general recommendations for a balanced deficit meal plan, which promotes 1-2 lb weight loss per week and includes a negative energy balance of [phone] kcal/d;Understanding recommendations for meals to include 15-35% energy as protein, 25-35% energy from fat, 35-60% energy from carbohydrates, less than 200mg  of dietary cholesterol, 20-35 gm of total fiber daily;Understanding of distribution of calorie intake throughout the day with the consumption of 4-5 meals/snacks    Improve shortness of breath with ADL's Yes    Intervention Provide education, individualized exercise plan and daily activity instruction to help decrease symptoms of SOB with activities of daily living.    Expected Outcomes Short Term: Improve cardiorespiratory fitness to achieve a reduction of symptoms when performing ADLs;Long Term: Be able to perform more ADLs without symptoms or delay the onset of symptoms    Diabetes Yes    Intervention Provide education about signs/symptoms and action to take for hypo/hyperglycemia.;Provide education about proper nutrition, including hydration, and aerobic/resistive exercise prescription along with prescribed medications to achieve blood glucose in normal ranges: Fasting glucose 65-99 mg/dL     Expected Outcomes Short Term: Participant verbalizes understanding of the signs/symptoms and immediate care of hyper/hypoglycemia, proper foot care and importance of medication, aerobic/resistive exercise and nutrition plan for blood glucose control.;Long Term: Attainment of HbA1C < 7%.    Hypertension Yes    Intervention Provide education on lifestyle modifcations including regular physical activity/exercise, weight management, moderate sodium restriction and increased consumption of fresh fruit, vegetables, and low fat dairy, alcohol moderation, and smoking cessation.;Monitor prescription use compliance.    Expected Outcomes Short Term: Continued assessment and intervention until BP is < 140/67mm HG in hypertensive participants. < 130/92mm HG in hypertensive participants with diabetes, heart failure or chronic kidney disease.;Long Term: Maintenance of blood pressure at goal levels.    Lipids Yes    Intervention Provide education and support for participant on nutrition & aerobic/resistive exercise along with prescribed medications to achieve LDL 70mg , HDL >40mg .    Expected Outcomes Short Term: Participant states understanding of desired cholesterol values and is compliant with medications prescribed. Participant is following exercise prescription and nutrition guidelines.;Long Term: Cholesterol controlled with medications as prescribed, with individualized exercise RX and with personalized nutrition plan. Value goals: LDL < 70mg , HDL > 40 mg.             Education:Diabetes - Individual verbal and written instruction to review signs/symptoms of diabetes, desired ranges of glucose level fasting, after meals and with exercise. Acknowledge that pre and post exercise glucose checks will be done for 3 sessions at entry of program. Flowsheet Row Pulmonary Rehab from 12/12/2020 in [facility] Cardiac and Pulmonary Rehab  Date 12/12/20  Educator [facility]  Instruction Review Code 1- Verbalizes Understanding        Know Your Numbers and Heart Failure: - Group verbal and visual instruction to discuss disease risk factors  for cardiac and pulmonary disease and treatment options.  Reviews associated critical values for Overweight/Obesity, Hypertension, Cholesterol, and Diabetes.  Discusses basics of heart failure: signs/symptoms and treatments.  Introduces Heart Failure Zone chart for action plan for heart failure.  Written material given at graduation.   Core Components/Risk Factors/Patient Goals Review:    Core Components/Risk Factors/Patient Goals at Discharge (Final Review):    ITP Comments:  ITP Comments     Row Name 12/12/20 1341 12/15/20 1204         ITP Comments Completed virtual orientation today.  EP evaluation is scheduled for Thursday 10/20 at 8am.  Documentation for diagnosis can be found in CE encounter 11/24/20. Completed 6MWT and gym orientation. Initial ITP created and sent for review to Dr. Ottie Glazier, Medical Director.               Comments: Initial ITP

## 2020-12-15 NOTE — Patient Instructions (Signed)
Patient Instructions  Patient Details  Name: Madeline Martinez MRN: [id number] Date of Birth: 08/28/1945 Referring Provider:  Ottie Glazier, MD  Below are your personal goals for exercise, nutrition, and risk factors. Our goal is to help you stay on track towards obtaining and maintaining these goals. We will be discussing your progress on these goals with you throughout the program.  Initial Exercise Prescription:  Initial Exercise Prescription - 12/15/20 1400       Date of Initial Exercise RX and Referring Provider   Date 12/15/20    Referring Provider Ottie Glazier MD      Oxygen   Oxygen Continuous    Liters 2   when available   Maintain Oxygen Saturation 88% or higher      Recumbant Bike   Level 1    RPM 60    Minutes 15    METs 1      NuStep   Level 1    SPM 80    Minutes 15    METs 1      REL-XR   Level 1    Speed 50    Minutes 15    METs 1      Prescription Details   Frequency (times per week) 3    Duration Progress to 30 minutes of continuous aerobic without signs/symptoms of physical distress      Intensity   THRR 40-80% of Max Heartrate 111-134    Ratings of Perceived Exertion 11-13    Perceived Dyspnea 0-4      Progression   Progression Continue to progress workloads to maintain intensity without signs/symptoms of physical distress.      Resistance Training   Training Prescription Yes    Weight 2 lb    Reps 10-15             Exercise Goals: Frequency: Be able to perform aerobic exercise two to three times per week in program working toward 2-5 days per week of home exercise.  Intensity: Work with a perceived exertion of 11 (fairly light) - 15 (hard) while following your exercise prescription.  We will make changes to your prescription with you as you progress through the program.   Duration: Be able to do 30 to 45 minutes of continuous aerobic exercise in addition to a 5 minute warm-up and a 5 minute cool-down routine.   Nutrition  Goals: Your personal nutrition goals will be established when you do your nutrition analysis with the dietician.  The following are general nutrition guidelines to follow: Cholesterol < 200mg /day Sodium < 1500mg /day Fiber: Women over 50 yrs - 21 grams per day  Personal Goals:  Personal Goals and Risk Factors at Admission - 12/15/20 1436       Core Components/Risk Factors/Patient Goals on Admission    Weight Management Yes;Weight Loss;Obesity    Intervention Weight Management: Develop a combined nutrition and exercise program designed to reach desired caloric intake, while maintaining appropriate intake of nutrient and fiber, sodium and fats, and appropriate energy expenditure required for the weight goal.;Weight Management: Provide education and appropriate resources to help participant work on and attain dietary goals.    Admit Weight 208 lb (94.3 kg)    Goal Weight: Short Term 203 lb (92.1 kg)    Goal Weight: Long Term 190 lb (86.2 kg)    Expected Outcomes Short Term: Continue to assess and modify interventions until short term weight is achieved;Long Term: Adherence to nutrition and physical activity/exercise program aimed toward attainment of  established weight goal;Weight Loss: Understanding of general recommendations for a balanced deficit meal plan, which promotes 1-2 lb weight loss per week and includes a negative energy balance of [phone] kcal/d;Understanding recommendations for meals to include 15-35% energy as protein, 25-35% energy from fat, 35-60% energy from carbohydrates, less than 200mg  of dietary cholesterol, 20-35 gm of total fiber daily;Understanding of distribution of calorie intake throughout the day with the consumption of 4-5 meals/snacks    Improve shortness of breath with ADL's Yes    Intervention Provide education, individualized exercise plan and daily activity instruction to help decrease symptoms of SOB with activities of daily living.    Expected Outcomes Short  Term: Improve cardiorespiratory fitness to achieve a reduction of symptoms when performing ADLs;Long Term: Be able to perform more ADLs without symptoms or delay the onset of symptoms    Diabetes Yes    Intervention Provide education about signs/symptoms and action to take for hypo/hyperglycemia.;Provide education about proper nutrition, including hydration, and aerobic/resistive exercise prescription along with prescribed medications to achieve blood glucose in normal ranges: Fasting glucose 65-99 mg/dL    Expected Outcomes Short Term: Participant verbalizes understanding of the signs/symptoms and immediate care of hyper/hypoglycemia, proper foot care and importance of medication, aerobic/resistive exercise and nutrition plan for blood glucose control.;Long Term: Attainment of HbA1C < 7%.    Hypertension Yes    Intervention Provide education on lifestyle modifcations including regular physical activity/exercise, weight management, moderate sodium restriction and increased consumption of fresh fruit, vegetables, and low fat dairy, alcohol moderation, and smoking cessation.;Monitor prescription use compliance.    Expected Outcomes Short Term: Continued assessment and intervention until BP is < 140/92mm HG in hypertensive participants. < 130/27mm HG in hypertensive participants with diabetes, heart failure or chronic kidney disease.;Long Term: Maintenance of blood pressure at goal levels.    Lipids Yes    Intervention Provide education and support for participant on nutrition & aerobic/resistive exercise along with prescribed medications to achieve LDL 70mg , HDL >40mg .    Expected Outcomes Short Term: Participant states understanding of desired cholesterol values and is compliant with medications prescribed. Participant is following exercise prescription and nutrition guidelines.;Long Term: Cholesterol controlled with medications as prescribed, with individualized exercise RX and with personalized nutrition  plan. Value goals: LDL < 70mg , HDL > 40 mg.             Tobacco Use Initial Evaluation: Social History   Tobacco Use  Smoking Status Never  Smokeless Tobacco Never    Exercise Goals and Review:  Exercise Goals     Row Name 12/15/20 1436             Exercise Goals   Increase Physical Activity Yes       Intervention Provide advice, education, support and counseling about physical activity/exercise needs.;Develop an individualized exercise prescription for aerobic and resistive training based on initial evaluation findings, risk stratification, comorbidities and participant's personal goals.       Expected Outcomes Short Term: Attend rehab on a regular basis to increase amount of physical activity.;Long Term: Add in home exercise to make exercise part of routine and to increase amount of physical activity.;Long Term: Exercising regularly at least 3-5 days a week.       Increase Strength and Stamina Yes       Intervention Provide advice, education, support and counseling about physical activity/exercise needs.;Develop an individualized exercise prescription for aerobic and resistive training based on initial evaluation findings, risk stratification, comorbidities and participant's personal goals.  Expected Outcomes Short Term: Increase workloads from initial exercise prescription for resistance, speed, and METs.;Short Term: Perform resistance training exercises routinely during rehab and add in resistance training at home;Long Term: Improve cardiorespiratory fitness, muscular endurance and strength as measured by increased METs and functional capacity (6MWT)       Able to understand and use rate of perceived exertion (RPE) scale Yes       Intervention Provide education and explanation on how to use RPE scale       Expected Outcomes Short Term: Able to use RPE daily in rehab to express subjective intensity level;Long Term:  Able to use RPE to guide intensity level when exercising  independently       Able to understand and use Dyspnea scale Yes       Intervention Provide education and explanation on how to use Dyspnea scale       Expected Outcomes Short Term: Able to use Dyspnea scale daily in rehab to express subjective sense of shortness of breath during exertion;Long Term: Able to use Dyspnea scale to guide intensity level when exercising independently       Knowledge and understanding of Target Heart Rate Range (THRR) Yes       Intervention Provide education and explanation of THRR including how the numbers were predicted and where they are located for reference       Expected Outcomes Short Term: Able to state/look up THRR;Long Term: Able to use THRR to govern intensity when exercising independently;Short Term: Able to use daily as guideline for intensity in rehab       Able to check pulse independently Yes       Intervention Provide education and demonstration on how to check pulse in carotid and radial arteries.;Review the importance of being able to check your own pulse for safety during independent exercise       Expected Outcomes Short Term: Able to explain why pulse checking is important during independent exercise;Long Term: Able to check pulse independently and accurately       Understanding of Exercise Prescription Yes       Intervention Provide education, explanation, and written materials on patient's individual exercise prescription       Expected Outcomes Short Term: Able to explain program exercise prescription;Long Term: Able to explain home exercise prescription to exercise independently                Copy of goals given to participant.

## 2020-12-21 ENCOUNTER — Other Ambulatory Visit: Payer: Self-pay

## 2020-12-21 DIAGNOSIS — J45909 Unspecified asthma, uncomplicated: Secondary | ICD-10-CM | POA: Diagnosis not present

## 2020-12-21 LAB — GLUCOSE, CAPILLARY
Glucose-Capillary: 125 mg/dL — ABNORMAL HIGH (ref 70–99)
Glucose-Capillary: 114 mg/dL — ABNORMAL HIGH (ref 70–99)

## 2020-12-21 NOTE — Progress Notes (Signed)
Daily Session Note  Patient Details  Name: Madeline Martinez MRN: [id number] Date of Birth: Jul 31, 1945 Referring Provider:   Flowsheet Row Pulmonary Rehab from 12/15/2020 in [facility] Cardiac and Pulmonary Rehab  Referring Provider Ottie Glazier MD       Encounter Date: 12/21/2020  Check In:  Session Check In - 12/21/20 1000       Check-In   Supervising physician immediately available to respond to emergencies See telemetry face sheet for immediately available ER MD    Location [facility]-Cardiac & Pulmonary Rehab    Staff Present Birdie Sons, MPA, RN;Joseph [place], Lindell Spar, BA, ACSM CEP, Exercise Physiologist;Jessica [place], MA, RCEP, CCRP, CCET    Virtual Visit No    Medication changes reported     No    Fall or balance concerns reported    No    Warm-up and Cool-down Performed on first and last piece of equipment    Resistance Training Performed Yes    VAD Patient? No    PAD/SET Patient? No      Pain Assessment   Currently in Pain? No/denies                Social History   Tobacco Use  Smoking Status Never  Smokeless Tobacco Never    Goals Met:  Independence with exercise equipment Exercise tolerated well No report of concerns or symptoms today Strength training completed today  Goals Unmet:  Not Applicable  Comments: First full day of exercise!  Patient was oriented to gym and equipment including functions, settings, policies, and procedures.  Patient's individual exercise prescription and treatment plan were reviewed.  All starting workloads were established based on the results of the 6 minute walk test done at initial orientation visit.  The plan for exercise progression was also introduced and progression will be customized based on patient's performance and goals.    Dr. Emily Filbert is Medical Director for [place].  Dr. Ottie Glazier is Medical Director for [facility] Pulmonary Rehabilitation.

## 2020-12-23 ENCOUNTER — Encounter: Payer: Medicare HMO | Admitting: *Deleted

## 2020-12-23 ENCOUNTER — Other Ambulatory Visit: Payer: Self-pay

## 2020-12-23 DIAGNOSIS — J45909 Unspecified asthma, uncomplicated: Secondary | ICD-10-CM

## 2020-12-23 LAB — GLUCOSE, CAPILLARY
Glucose-Capillary: 178 mg/dL — ABNORMAL HIGH (ref 70–99)
Glucose-Capillary: 111 mg/dL — ABNORMAL HIGH (ref 70–99)

## 2020-12-23 NOTE — Progress Notes (Signed)
Daily Session Note  Patient Details  Name: Madeline Martinez MRN: [id number] Date of Birth: [birth year] Referring Provider:   Flowsheet Row Pulmonary Rehab from 12/15/2020 in [facility] Cardiac and Pulmonary Rehab  Referring Provider Ottie Glazier MD       Encounter Date: 12/23/2020  Check In:  Session Check In - 12/23/20 0943       Check-In   Supervising physician immediately available to respond to emergencies See telemetry face sheet for immediately available ER MD    Location [facility]-Cardiac & Pulmonary Rehab    Staff Present Renita Papa, RN BSN;Joseph [place], RCP,RRT,BSRT;Jessica [place], [place], RCEP, CCRP, CCET    Virtual Visit No    Medication changes reported     No    Fall or balance concerns reported    No    Warm-up and Cool-down Performed on first and last piece of equipment    Resistance Training Performed Yes    VAD Patient? No      Pain Assessment   Currently in Pain? No/denies                Social History   Tobacco Use  Smoking Status Never  Smokeless Tobacco Never    Goals Met:  Independence with exercise equipment Exercise tolerated well No report of concerns or symptoms today Strength training completed today  Goals Unmet:  Not Applicable  Comments: Pt able to follow exercise prescription today without complaint.  Will continue to monitor for progression.    Dr. Emily Filbert is Medical Director for [place].  Dr. Ottie Glazier is Medical Director for [facility] Pulmonary Rehabilitation.

## 2020-12-26 ENCOUNTER — Encounter: Payer: Medicare HMO | Admitting: *Deleted

## 2020-12-26 ENCOUNTER — Other Ambulatory Visit: Payer: Self-pay

## 2020-12-26 DIAGNOSIS — J45909 Unspecified asthma, uncomplicated: Secondary | ICD-10-CM

## 2020-12-26 LAB — GLUCOSE, CAPILLARY
Glucose-Capillary: 150 mg/dL — ABNORMAL HIGH (ref 70–99)
Glucose-Capillary: 122 mg/dL — ABNORMAL HIGH (ref 70–99)

## 2020-12-26 NOTE — Progress Notes (Signed)
Daily Session Note  Patient Details  Name: Leeana Creer MRN: [id number] Date of Birth: 29-Aug-1945 Referring Provider:   Flowsheet Row Pulmonary Rehab from 12/15/2020 in [facility] Cardiac and Pulmonary Rehab  Referring Provider Ottie Glazier MD       Encounter Date: 12/26/2020  Check In:  Session Check In - 12/26/20 1013       Check-In   Supervising physician immediately available to respond to emergencies See telemetry face sheet for immediately available ER MD    Location [facility]-Cardiac & Pulmonary Rehab    Staff Present Heath Lark, RN, BSN, CCRP;Laureen Owens Shark, BS, RRT, CPFT;Kelly Amedeo Plenty, BS, ACSM CEP, Exercise Physiologist;Meredith Sherryll Burger, RN BSN    Virtual Visit No    Medication changes reported     No    Fall or balance concerns reported    No    Warm-up and Cool-down Performed on first and last piece of equipment    Resistance Training Performed Yes    VAD Patient? No    PAD/SET Patient? No      Pain Assessment   Currently in Pain? No/denies                Social History   Tobacco Use  Smoking Status Never  Smokeless Tobacco Never    Goals Met:  Proper associated with RPD/PD & O2 Sat Independence with exercise equipment Exercise tolerated well No report of concerns or symptoms today  Goals Unmet:  Not Applicable  Comments: Pt able to follow exercise prescription today without complaint.  Will continue to monitor for progression.    Dr. Emily Filbert is Medical Director for [place].  Dr. Ottie Glazier is Medical Director for [facility] Pulmonary Rehabilitation.

## 2020-12-28 ENCOUNTER — Encounter: Payer: Self-pay | Admitting: *Deleted

## 2020-12-28 ENCOUNTER — Other Ambulatory Visit: Payer: Self-pay

## 2020-12-28 ENCOUNTER — Encounter: Payer: Medicare HMO | Attending: Pulmonary Disease

## 2020-12-28 DIAGNOSIS — J45909 Unspecified asthma, uncomplicated: Secondary | ICD-10-CM | POA: Insufficient documentation

## 2020-12-28 NOTE — Progress Notes (Signed)
Daily Session Note  Patient Details  Name: Madeline Martinez MRN: [id number] Date of Birth: 1945/04/12 Referring Provider:   Flowsheet Row Pulmonary Rehab from 12/15/2020 in [facility] Cardiac and Pulmonary Rehab  Referring Provider Ottie Glazier MD       Encounter Date: 12/28/2020  Check In:  Session Check In - 12/28/20 1000       Check-In   Supervising physician immediately available to respond to emergencies See telemetry face sheet for immediately available ER MD    Location [facility]-Cardiac & Pulmonary Rehab    Staff Present Birdie Sons, MPA, Elveria Rising, BA, ACSM CEP, Exercise Physiologist;Joseph Tessie Fass, [place]    Virtual Visit No    Medication changes reported     No    Fall or balance concerns reported    No    Warm-up and Cool-down Performed on first and last piece of equipment    Resistance Training Performed Yes    VAD Patient? No    PAD/SET Patient? No      Pain Assessment   Currently in Pain? No/denies                Social History   Tobacco Use  Smoking Status Never  Smokeless Tobacco Never    Goals Met:  Independence with exercise equipment Exercise tolerated well No report of concerns or symptoms today Strength training completed today  Goals Unmet:  Not Applicable  Comments: Pt able to follow exercise prescription today without complaint.  Will continue to monitor for progression.    Dr. Emily Filbert is Medical Director for [place].  Dr. Ottie Glazier is Medical Director for [facility] Pulmonary Rehabilitation.

## 2020-12-28 NOTE — Progress Notes (Signed)
Pulmonary Individual Treatment Plan  Patient Details  Name: Madeline Martinez MRN: [id number] Date of Birth: [birth year] Referring Provider:   Flowsheet Row Pulmonary Rehab from 12/15/2020 in [facility] Cardiac and Pulmonary Rehab  Referring Provider Ottie Glazier MD       Initial Encounter Date:  Flowsheet Row Pulmonary Rehab from 12/15/2020 in [facility] Cardiac and Pulmonary Rehab  Date 12/15/20       Visit Diagnosis: Asthma, unspecified asthma severity, unspecified whether complicated, unspecified whether persistent  Patient's Home Medications on Admission:  Current Outpatient Medications:    allopurinol (ZYLOPRIM) 100 MG tablet, Take 100 mg by mouth daily with lunch. , Disp: , Rfl:    aspirin 81 MG EC tablet, Take by mouth., Disp: , Rfl:    atorvastatin (LIPITOR) 80 MG tablet, Take 80 mg by mouth daily., Disp: , Rfl:    Biotin 5000 MCG TABS, Take 5,000 mcg by mouth daily. HAIR/SKIN/NAILS, Disp: , Rfl:    carvedilol (COREG) 12.5 MG tablet, Take 12.5 mg by mouth 2 (two) times daily., Disp: , Rfl:    cetirizine (ZYRTEC) 10 MG tablet, Take 10 mg by mouth daily.  (Patient not taking: Reported on 12/12/2020), Disp: , Rfl:    Cholecalciferol (VITAMIN D-3) 125 MCG (5000 UT) TABS, Take 5,000 Units by mouth daily., Disp: , Rfl:    DEXILANT 60 MG capsule, Take 1 capsule by mouth daily. (Patient not taking: Reported on 12/12/2020), Disp: , Rfl:    ferrous sulfate 325 (65 FE) MG tablet, Take 325 mg by mouth daily., Disp: , Rfl:    FLUoxetine (PROZAC) 40 MG capsule, Take 40 mg by mouth daily., Disp: , Rfl:    gabapentin (NEURONTIN) 400 MG capsule, Take 400 mg by mouth daily. (Patient not taking: Reported on 12/12/2020), Disp: , Rfl:    glipiZIDE (GLUCOTROL XL) 5 MG 24 hr tablet, Take 5 mg by mouth 3 (three) times daily., Disp: , Rfl:    hydrochlorothiazide (HYDRODIURIL) 25 MG tablet, Take 25 mg by mouth daily., Disp: , Rfl:    HYDROcodone-acetaminophen (NORCO/VICODIN) 5-325 MG tablet, Take 1 tablet by  mouth daily as needed (pain.). , Disp: , Rfl:    levothyroxine (SYNTHROID) 25 MCG tablet, Take 25 mcg by mouth daily., Disp: , Rfl:    lidocaine (LIDODERM) 5 %, Place 1 patch onto the skin daily. Remove & Discard patch within 12 hours or as directed by MD, Disp: 15 patch, Rfl: 0   LINZESS 290 MCG CAPS capsule, TAKE 1 CAPSULE BY MOUTH DAILY BEFORE BREAKFAST., Disp: 90 capsule, Rfl: 1   LORazepam (ATIVAN) 1 MG tablet, Take 1 mg by mouth 4 (four) times daily. , Disp: , Rfl:    losartan (COZAAR) 100 MG tablet, Take 100 mg by mouth daily., Disp: , Rfl:    meloxicam (MOBIC) 15 MG tablet, Take 15 mg by mouth daily. (Patient not taking: No sig reported), Disp: , Rfl:    metFORMIN (GLUCOPHAGE) 1000 MG tablet, Take 1,000 mg by mouth 2 (two) times daily., Disp: , Rfl:    methocarbamol (ROBAXIN) 750 MG tablet, Take 750 mg by mouth daily with lunch.  (Patient not taking: No sig reported), Disp: , Rfl:    mirabegron ER (MYRBETRIQ) 50 MG TB24 tablet, Take 1 tablet (50 mg total) by mouth daily., Disp: 90 tablet, Rfl: 3   mirtazapine (REMERON) 15 MG tablet, Take 15 mg by mouth at bedtime. , Disp: , Rfl:    omeprazole (PRILOSEC) 20 MG capsule, TAKE 2 CAPSULES (40 MG TOTAL) BY MOUTH 2 (  TWO) TIMES DAILY BEFORE A MEAL., Disp: 180 capsule, Rfl: 1   oxybutynin (DITROPAN-XL) 10 MG 24 hr tablet, TAKE 1 TABLET BY MOUTH EVERY DAY, Disp: 90 tablet, Rfl: 3   oxyCODONE-acetaminophen (PERCOCET/ROXICET) 5-325 MG tablet, Take 1 tablet by mouth every 4 (four) hours as needed., Disp: 15 tablet, Rfl: 0   pregabalin (LYRICA) 75 MG capsule, Take 75 mg by mouth 3 (three) times daily., Disp: , Rfl:    Probiotic Product (PROBIOTIC PO), Take 1 tablet by mouth daily. (Patient not taking: Reported on 12/12/2020), Disp: , Rfl:    TOUJEO SOLOSTAR 300 UNIT/ML SOPN, Inject 20 Units into the skin daily. , Disp: , Rfl:    vitamin E 45 MG (100 UNITS) capsule, Take by mouth., Disp: , Rfl:   Past Medical History: Past Medical History:  Diagnosis  Date   Acid reflux    Anxiety    Depression    Diabetes mellitus    Dysrhythmia    Gout    Heart murmur    HTN (hypertension)    Hyperlipidemia    Sleep apnea     Tobacco Use: Social History   Tobacco Use  Smoking Status Never  Smokeless Tobacco Never    Labs: Recent Review Flowsheet Data     Labs for ITP Cardiac and Pulmonary Rehab Latest Ref Rng & Units 02/14/2010 04/01/2020   Cholestrol 0 - 200 mg/dL 217(A) -   LDLCALC mg/dL 135 -   HDL 35 - 70 mg/dL 61 -   Trlycerides 40 - 160 mg/dL 103 -   TCO2 22 - 32 mmol/L - 27        Pulmonary Assessment Scores:  Pulmonary Assessment Scores     Row Name 12/15/20 1427         ADL UCSD   ADL Phase Entry     SOB Score total 59     Rest 0     Walk 5     Stairs 5     Bath 0     Dress 5     Shop 4       CAT Score   CAT Score 37       mMRC Score   mMRC Score 3              UCSD: Self-administered rating of dyspnea associated with activities of daily living (ADLs) 6-point scale (0 = "not at all" to 5 = "maximal or unable to do because of breathlessness")  Scoring Scores range from 0 to 120.  Minimally important difference is 5 units  CAT: CAT can identify the health impairment of COPD patients and is better correlated with disease progression.  CAT has a scoring range of zero to 40. The CAT score is classified into four groups of low (less than 10), medium (10 - 20), high (21-30) and very high (31-40) based on the impact level of disease on health status. A CAT score over 10 suggests significant symptoms.  A worsening CAT score could be explained by an exacerbation, poor medication adherence, poor inhaler technique, or progression of COPD or comorbid conditions.  CAT MCID is 2 points  mMRC: mMRC (Modified Medical Research Council) Dyspnea Scale is used to assess the degree of baseline functional disability in patients of respiratory disease due to dyspnea. No minimal important difference is established. A  decrease in score of 1 point or greater is considered a positive change.   Pulmonary Function Assessment:   Exercise Target Goals: Exercise Program Goal:  Individual exercise prescription set using results from initial 6 min walk test and THRR while considering  patient's activity barriers and safety.   Exercise Prescription Goal: Initial exercise prescription builds to 30-45 minutes a day of aerobic activity, 2-3 days per week.  Home exercise guidelines will be given to patient during program as part of exercise prescription that the participant will acknowledge.  Education: Aerobic Exercise: - Group verbal and visual presentation on the components of exercise prescription. Introduces F.I.T.T principle from ACSM for exercise prescriptions.  Reviews F.I.T.T. principles of aerobic exercise including progression. Written material given at graduation.   Education: Resistance Exercise: - Group verbal and visual presentation on the components of exercise prescription. Introduces F.I.T.T principle from ACSM for exercise prescriptions  Reviews F.I.T.T. principles of resistance exercise including progression. Written material given at graduation.    Education: Exercise & Equipment Safety: - Individual verbal instruction and demonstration of equipment use and safety with use of the equipment. Flowsheet Row Pulmonary Rehab from 12/15/2020 in [facility] Cardiac and Pulmonary Rehab  Education need identified 12/15/20  Date 12/15/20  Educator [place]  Instruction Review Code 1- Verbalizes Understanding       Education: Exercise Physiology & General Exercise Guidelines: - Group verbal and written instruction with models to review the exercise physiology of the cardiovascular system and associated critical values. Provides general exercise guidelines with specific guidelines to those with heart or lung disease.    Education: Flexibility, Balance, Mind/Body Relaxation: - Group verbal and visual presentation  with interactive activity on the components of exercise prescription. Introduces F.I.T.T principle from ACSM for exercise prescriptions. Reviews F.I.T.T. principles of flexibility and balance exercise training including progression. Also discusses the mind body connection.  Reviews various relaxation techniques to help reduce and manage stress (i.e. Deep breathing, progressive muscle relaxation, and visualization). Balance handout provided to take home. Written material given at graduation.   Activity Barriers & Risk Stratification:  Activity Barriers & Cardiac Risk Stratification - 12/15/20 1425       Activity Barriers & Cardiac Risk Stratification   Activity Barriers Back Problems;Other (comment);Shortness of Breath;Muscular Weakness;Deconditioning;Joint Problems;Balance Concerns;History of Falls    Comments chronic back pain, hard for her to walk distance, occasional knee pain             6 Minute Walk:  6 Minute Walk     Row Name 12/15/20 1431         6 Minute Walk   Phase Initial     Distance 90 feet     Walk Time 4 minutes  Break 1:54-4:10     # of Rest Breaks 1     MPH 0.17     METS 0.1     RPE 15     Perceived Dyspnea  3     VO2 Peak 0.35     Symptoms Yes (comment)     Comments SOB, Right knee pain 5/10     Resting HR 89 bpm     Resting BP 140/70     Resting Oxygen Saturation  94 %     Exercise Oxygen Saturation  during 6 min walk 93 %     Max Ex. HR 102 bpm     Max Ex. BP 162/72     2 Minute Post BP 144/74       Interval HR   1 Minute HR 102     2 Minute HR 98     3 Minute HR 99     4  Minute HR 97     5 Minute HR 102     6 Minute HR 88     2 Minute Post HR 84     Interval Heart Rate? Yes       Interval Oxygen   Interval Oxygen? Yes     Baseline Oxygen Saturation % 94 %     1 Minute Oxygen Saturation % 93 %     1 Minute Liters of Oxygen 0 L  RA     2 Minute Oxygen Saturation % 95 %     2 Minute Liters of Oxygen 0 L     3 Minute Oxygen Saturation %  94 %     3 Minute Liters of Oxygen 0 L     4 Minute Oxygen Saturation % 97 %     4 Minute Liters of Oxygen 0 L     5 Minute Oxygen Saturation % 96 %     5 Minute Liters of Oxygen 0 L     6 Minute Oxygen Saturation % 95 %     6 Minute Liters of Oxygen 0 L     2 Minute Post Oxygen Saturation % 96 %     2 Minute Post Liters of Oxygen 0 L             Oxygen Initial Assessment:  Oxygen Initial Assessment - 12/15/20 1426       Home Oxygen   Home Oxygen Device None   currently working up oxygen therapy   Sleep Oxygen Prescription None   has CPAP on back order   Home Exercise Oxygen Prescription None   Working on getting oxygen   Home Resting Oxygen Prescription None      Initial 6 min Walk   Oxygen Used None      Program Oxygen Prescription   Program Oxygen Prescription Continuous   when available   Liters per minute 2      Intervention   Short Term Goals To learn and exhibit compliance with exercise, home and travel O2 prescription;To learn and understand importance of monitoring SPO2 with pulse oximeter and demonstrate accurate use of the pulse oximeter.;To learn and understand importance of maintaining oxygen saturations>88%;To learn and demonstrate proper pursed lip breathing techniques or other breathing techniques. ;To learn and demonstrate proper use of respiratory medications    Long  Term Goals Exhibits compliance with exercise, home  and travel O2 prescription;Verbalizes importance of monitoring SPO2 with pulse oximeter and return demonstration;Maintenance of O2 saturations>88%;Exhibits proper breathing techniques, such as pursed lip breathing or other method taught during program session;Compliance with respiratory medication;Demonstrates proper use of MDI's             Oxygen Re-Evaluation:  Oxygen Re-Evaluation     Row Name 12/21/20 1002             Program Oxygen Prescription   Program Oxygen Prescription Continuous       Liters per minute 2         Home  Oxygen   Home Oxygen Device None       Sleep Oxygen Prescription None       Home Exercise Oxygen Prescription None       Home Resting Oxygen Prescription None         Goals/Expected Outcomes   Short Term Goals To learn and exhibit compliance with exercise, home and travel O2 prescription;To learn and understand importance of monitoring SPO2 with pulse oximeter and demonstrate accurate use  of the pulse oximeter.;To learn and understand importance of maintaining oxygen saturations>88%;To learn and demonstrate proper pursed lip breathing techniques or other breathing techniques.        Long  Term Goals Exhibits compliance with exercise, home  and travel O2 prescription;Verbalizes importance of monitoring SPO2 with pulse oximeter and return demonstration;Maintenance of O2 saturations>88%;Exhibits proper breathing techniques, such as pursed lip breathing or other method taught during program session;Compliance with respiratory medication       Comments Reviewed PLB technique with pt.  Talked about how it works and it's importance in maintaining their exercise saturations.       Goals/Expected Outcomes Short: Become more profiecient at using PLB.   Long: Become independent at using PLB.                Oxygen Discharge (Final Oxygen Re-Evaluation):  Oxygen Re-Evaluation - 12/21/20 1002       Program Oxygen Prescription   Program Oxygen Prescription Continuous    Liters per minute 2      Home Oxygen   Home Oxygen Device None    Sleep Oxygen Prescription None    Home Exercise Oxygen Prescription None    Home Resting Oxygen Prescription None      Goals/Expected Outcomes   Short Term Goals To learn and exhibit compliance with exercise, home and travel O2 prescription;To learn and understand importance of monitoring SPO2 with pulse oximeter and demonstrate accurate use of the pulse oximeter.;To learn and understand importance of maintaining oxygen saturations>88%;To learn and demonstrate proper  pursed lip breathing techniques or other breathing techniques.     Long  Term Goals Exhibits compliance with exercise, home  and travel O2 prescription;Verbalizes importance of monitoring SPO2 with pulse oximeter and return demonstration;Maintenance of O2 saturations>88%;Exhibits proper breathing techniques, such as pursed lip breathing or other method taught during program session;Compliance with respiratory medication    Comments Reviewed PLB technique with pt.  Talked about how it works and it's importance in maintaining their exercise saturations.    Goals/Expected Outcomes Short: Become more profiecient at using PLB.   Long: Become independent at using PLB.             Initial Exercise Prescription:  Initial Exercise Prescription - 12/15/20 1400       Date of Initial Exercise RX and Referring Provider   Date 12/15/20    Referring Provider Ottie Glazier MD      Oxygen   Oxygen Continuous    Liters 2   when available   Maintain Oxygen Saturation 88% or higher      Recumbant Bike   Level 1    RPM 60    Minutes 15    METs 1      NuStep   Level 1    SPM 80    Minutes 15    METs 1      REL-XR   Level 1    Speed 50    Minutes 15    METs 1      Prescription Details   Frequency (times per week) 3    Duration Progress to 30 minutes of continuous aerobic without signs/symptoms of physical distress      Intensity   THRR 40-80% of Max Heartrate 111-134    Ratings of Perceived Exertion 11-13    Perceived Dyspnea 0-4      Progression   Progression Continue to progress workloads to maintain intensity without signs/symptoms of physical distress.      Resistance  Training   Training Prescription Yes    Weight 2 lb    Reps 10-15             Perform Capillary Blood Glucose checks as needed.  Exercise Prescription Changes:   Exercise Prescription Changes     Row Name 12/15/20 1400 12/26/20 0900           Response to Exercise   Blood Pressure (Admit)  140/70 134/72      Blood Pressure (Exercise) 162/72 142/76      Blood Pressure (Exit) 144/74 118/60      Heart Rate (Admit) 89 bpm 97 bpm      Heart Rate (Exercise) 102 bpm 104 bpm      Heart Rate (Exit) 84 bpm 89 bpm      Oxygen Saturation (Admit) 94 % 96 %      Oxygen Saturation (Exercise) 93 % 94 %      Oxygen Saturation (Exit) 96 % 96 %      Rating of Perceived Exertion (Exercise) 15 15      Perceived Dyspnea (Exercise) 3 3      Symptoms SOB, right knee pain 5/10 SOB      Comments walk test results second full day of exercise      Duration -- Progress to 30 minutes of  aerobic without signs/symptoms of physical distress      Intensity -- THRR unchanged        Progression   Progression -- Continue to progress workloads to maintain intensity without signs/symptoms of physical distress.      Average METs -- 1.25        Resistance Training   Training Prescription -- Yes      Weight -- 2 lb      Reps -- 10-15        Interval Training   Interval Training -- No        Oxygen   Oxygen -- --  Currently using Room Air        NuStep   Level -- 1      Minutes -- 30      METs -- 1.5        Biostep-RELP   Level -- 1      Minutes -- 30      METs -- 1        Oxygen   Maintain Oxygen Saturation -- 88% or higher               Exercise Comments:   Exercise Comments     Row Name 12/21/20 1001           Exercise Comments First full day of exercise!  Patient was oriented to gym and equipment including functions, settings, policies, and procedures.  Patient's individual exercise prescription and treatment plan were reviewed.  All starting workloads were established based on the results of the 6 minute walk test done at initial orientation visit.  The plan for exercise progression was also introduced and progression will be customized based on patient's performance and goals.                Exercise Goals and Review:   Exercise Goals     Row Name 12/15/20 1436              Exercise Goals   Increase Physical Activity Yes       Intervention Provide advice, education, support and counseling about physical activity/exercise needs.;Develop an individualized exercise prescription  for aerobic and resistive training based on initial evaluation findings, risk stratification, comorbidities and participant's personal goals.       Expected Outcomes Short Term: Attend rehab on a regular basis to increase amount of physical activity.;Long Term: Add in home exercise to make exercise part of routine and to increase amount of physical activity.;Long Term: Exercising regularly at least 3-5 days a week.       Increase Strength and Stamina Yes       Intervention Provide advice, education, support and counseling about physical activity/exercise needs.;Develop an individualized exercise prescription for aerobic and resistive training based on initial evaluation findings, risk stratification, comorbidities and participant's personal goals.       Expected Outcomes Short Term: Increase workloads from initial exercise prescription for resistance, speed, and METs.;Short Term: Perform resistance training exercises routinely during rehab and add in resistance training at home;Long Term: Improve cardiorespiratory fitness, muscular endurance and strength as measured by increased METs and functional capacity (6MWT)       Able to understand and use rate of perceived exertion (RPE) scale Yes       Intervention Provide education and explanation on how to use RPE scale       Expected Outcomes Short Term: Able to use RPE daily in rehab to express subjective intensity level;Long Term:  Able to use RPE to guide intensity level when exercising independently       Able to understand and use Dyspnea scale Yes       Intervention Provide education and explanation on how to use Dyspnea scale       Expected Outcomes Short Term: Able to use Dyspnea scale daily in rehab to express subjective sense of  shortness of breath during exertion;Long Term: Able to use Dyspnea scale to guide intensity level when exercising independently       Knowledge and understanding of Target Heart Rate Range (THRR) Yes       Intervention Provide education and explanation of THRR including how the numbers were predicted and where they are located for reference       Expected Outcomes Short Term: Able to state/look up THRR;Long Term: Able to use THRR to govern intensity when exercising independently;Short Term: Able to use daily as guideline for intensity in rehab       Able to check pulse independently Yes       Intervention Provide education and demonstration on how to check pulse in carotid and radial arteries.;Review the importance of being able to check your own pulse for safety during independent exercise       Expected Outcomes Short Term: Able to explain why pulse checking is important during independent exercise;Long Term: Able to check pulse independently and accurately       Understanding of Exercise Prescription Yes       Intervention Provide education, explanation, and written materials on patient's individual exercise prescription       Expected Outcomes Short Term: Able to explain program exercise prescription;Long Term: Able to explain home exercise prescription to exercise independently                Exercise Goals Re-Evaluation :  Exercise Goals Re-Evaluation     Row Name 12/21/20 1001 12/26/20 0940           Exercise Goal Re-Evaluation   Exercise Goals Review Able to understand and use rate of perceived exertion (RPE) scale;Increase Physical Activity;Knowledge and understanding of Target Heart Rate Range (THRR);Understanding of Exercise Prescription;Increase Strength and Stamina;Able  to understand and use Dyspnea scale;Able to check pulse independently Increase Physical Activity;Increase Strength and Stamina;Understanding of Exercise Prescription      Comments Reviewed RPE and dyspnea  scales, THR and program prescription with pt today.  Pt voiced understanding and was given a copy of goals to take home. Madeline Martinez is off to a good start in rehab.  She has completed her first two full days of exercise thus far.  We will continue to monitor her progress.      Expected Outcomes Short: Use RPE daily to regulate intensity. Long: Follow program prescription in THR. Short: Continue to attend rehab regularly Long: Continue to follow program prescription               Discharge Exercise Prescription (Final Exercise Prescription Changes):  Exercise Prescription Changes - 12/26/20 0900       Response to Exercise   Blood Pressure (Admit) 134/72    Blood Pressure (Exercise) 142/76    Blood Pressure (Exit) 118/60    Heart Rate (Admit) 97 bpm    Heart Rate (Exercise) 104 bpm    Heart Rate (Exit) 89 bpm    Oxygen Saturation (Admit) 96 %    Oxygen Saturation (Exercise) 94 %    Oxygen Saturation (Exit) 96 %    Rating of Perceived Exertion (Exercise) 15    Perceived Dyspnea (Exercise) 3    Symptoms SOB    Comments second full day of exercise    Duration Progress to 30 minutes of  aerobic without signs/symptoms of physical distress    Intensity THRR unchanged      Progression   Progression Continue to progress workloads to maintain intensity without signs/symptoms of physical distress.    Average METs 1.25      Resistance Training   Training Prescription Yes    Weight 2 lb    Reps 10-15      Interval Training   Interval Training No      Oxygen   Oxygen --   Currently using Room Air     NuStep   Level 1    Minutes 30    METs 1.5      Biostep-RELP   Level 1    Minutes 30    METs 1      Oxygen   Maintain Oxygen Saturation 88% or higher             Nutrition:  Target Goals: Understanding of nutrition guidelines, daily intake of sodium 1500mg , cholesterol 200mg , calories 30% from fat and 7% or less from saturated fats, daily to have 5 or more servings of  fruits and vegetables.  Education: All About Nutrition: -Group instruction provided by verbal, written material, interactive activities, discussions, models, and posters to present general guidelines for heart healthy nutrition including fat, fiber, MyPlate, the role of sodium in heart healthy nutrition, utilization of the nutrition label, and utilization of this knowledge for meal planning. Follow up email sent as well. Written material given at graduation. Flowsheet Row Pulmonary Rehab from 12/15/2020 in [facility] Cardiac and Pulmonary Rehab  Education need identified 12/15/20       Biometrics:  Pre Biometrics - 12/15/20 1425       Pre Biometrics   Height 5' 0.5" (1.537 m)    Weight 208 lb 9.6 oz (94.6 kg)    BMI (Calculated) 40.05              Nutrition Therapy Plan and Nutrition Goals:  Nutrition Therapy & Goals -  12/12/20 1327       Intervention Plan   Intervention Prescribe, educate and counsel regarding individualized specific dietary modifications aiming towards targeted core components such as weight, hypertension, lipid management, diabetes, heart failure and other comorbidities.    Expected Outcomes Short Term Goal: Understand basic principles of dietary content, such as calories, fat, sodium, cholesterol and nutrients.;Short Term Goal: A plan has been developed with personal nutrition goals set during dietitian appointment.;Long Term Goal: Adherence to prescribed nutrition plan.             Nutrition Assessments:  MEDIFICTS Score Key: ?70 Need to make dietary changes  40-70 Heart Healthy Diet ? 40 Therapeutic Level Cholesterol Diet  Flowsheet Row Pulmonary Rehab from 12/15/2020 in [facility] Cardiac and Pulmonary Rehab  Picture Your Plate Total Score on Admission 49      Picture Your Plate Scores: <98 Unhealthy dietary pattern with much room for improvement. 41-50 Dietary pattern unlikely to meet recommendations for good health and room for improvement. 51-60  More healthful dietary pattern, with some room for improvement.  >60 Healthy dietary pattern, although there may be some specific behaviors that could be improved.   Nutrition Goals Re-Evaluation:   Nutrition Goals Discharge (Final Nutrition Goals Re-Evaluation):   Psychosocial: Target Goals: Acknowledge presence or absence of significant depression and/or stress, maximize coping skills, provide positive support system. Participant is able to verbalize types and ability to use techniques and skills needed for reducing stress and depression.   Education: Stress, Anxiety, and Depression - Group verbal and visual presentation to define topics covered.  Reviews how body is impacted by stress, anxiety, and depression.  Also discusses healthy ways to reduce stress and to treat/manage anxiety and depression.  Written material given at graduation.   Education: Sleep Hygiene -Provides group verbal and written instruction about how sleep can affect your health.  Define sleep hygiene, discuss sleep cycles and impact of sleep habits. Review good sleep hygiene tips.    Initial Review & Psychosocial Screening:  Initial Psych Review & Screening - 12/12/20 1327       Initial Review   Current issues with Current Depression;Current Anxiety/Panic;History of Depression;Current Psychotropic Meds;Current Stress Concerns    Source of Stress Concerns Chronic Illness;Unable to participate in former interests or hobbies;Unable to perform yard/household activities    Comments getting worked up for oxygen therapy, on meds for both depression and anxiety (feels well managed)      [place]? Yes   two daughters (lives with one of them, other near by), sister also comes to help as needed     Barriers   Psychosocial barriers to participate in program The patient should benefit from training in stress management and relaxation.;Psychosocial barriers identified (see note)      Screening  Interventions   Interventions Encouraged to exercise;To provide support and resources with identified psychosocial needs;Provide feedback about the scores to participant    Expected Outcomes Short Term goal: Utilizing psychosocial counselor, staff and physician to assist with identification of specific Stressors or current issues interfering with healing process. Setting desired goal for each stressor or current issue identified.;Long Term Goal: Stressors or current issues are controlled or eliminated.;Short Term goal: Identification and review with participant of any Quality of Life or Depression concerns found by scoring the questionnaire.;Long Term goal: The participant improves quality of Life and PHQ9 Scores as seen by post scores and/or verbalization of changes  Quality of Life Scores:  Scores of 19 and below usually indicate a poorer quality of life in these areas.  A difference of  2-3 points is a clinically meaningful difference.  A difference of 2-3 points in the total score of the Quality of Life Index has been associated with significant improvement in overall quality of life, self-image, physical symptoms, and general health in studies assessing change in quality of life.  PHQ-9: Recent Review Flowsheet Data     Depression screen [facility] 2/9 12/15/2020   Decreased Interest 2   Down, Depressed, Hopeless 0   PHQ - 2 Score 2   Altered sleeping 3   Tired, decreased energy 2   Change in appetite 3   Feeling bad or failure about yourself  0   Trouble concentrating 3   Moving slowly or fidgety/restless 2   Suicidal thoughts 0   PHQ-9 Score 15   Difficult doing work/chores Somewhat difficult      Interpretation of Total Score  Total Score Depression Severity:  1-4 = Minimal depression, 5-9 = Mild depression, 10-14 = Moderate depression, 15-19 = Moderately severe depression, 20-27 = Severe depression   Psychosocial Evaluation and Intervention:  Psychosocial Evaluation  - 12/12/20 1342       Psychosocial Evaluation & Interventions   Interventions Encouraged to exercise with the program and follow exercise prescription;Stress management education    Comments Ms. Schuessler is coming into pulmonary rehab for asthma.  She has oxygen prescribed but they are still in the process of getting her set up for home oxygen therapy.  She was encouraged to also request a pulse oximeter to monitor her saturations at home.  Currently, she does not have any portable equipment, thus we will see how she does on her walk test without oxygen.  She has also had a sleep study completed that shows that she needs a CPAP but it is currently on back order.  She is eager to start rehab and hopes to learn her limitations and be able to breathe better.  She lives at home with one of her two daughters who help her as needed.  She also has a sister that lives within a few hours drive that also stops by to help when needed.  She has a long history of both depression and anxiety and is on medication for both.  She is currently feeling like her mood is well managed and does not feel that she has any current symptoms.  We will continue to check in with her on these.    Expected Outcomes Short: Attend rehab to learn limits and breathing techniques to help her manage life better Long: Continued compliance on medication and mood boost with exercise too    Continue Psychosocial Services  Follow up required by staff             Psychosocial Re-Evaluation:   Psychosocial Discharge (Final Psychosocial Re-Evaluation):   Education: Education Goals: Education classes will be provided on a weekly basis, covering required topics. Participant will state understanding/return demonstration of topics presented.  Learning Barriers/Preferences:  Learning Barriers/Preferences - 12/12/20 1324       Learning Barriers/Preferences   Learning Barriers Sight   contacts and glasses, corneal transplant   Learning  Preferences None             General Pulmonary Education Topics:  Infection Prevention: - Provides verbal and written material to individual with discussion of infection control including proper hand washing and proper equipment cleaning  during exercise session. Flowsheet Row Pulmonary Rehab from 12/15/2020 in [facility] Cardiac and Pulmonary Rehab  Education need identified 12/15/20  Date 12/15/20  Educator [place]  Instruction Review Code 1- Verbalizes Understanding       Falls Prevention: - Provides verbal and written material to individual with discussion of falls prevention and safety. Flowsheet Row Pulmonary Rehab from 12/15/2020 in [facility] Cardiac and Pulmonary Rehab  Education need identified 12/15/20  Date 12/15/20  Educator [facility]  Instruction Review Code 1- Verbalizes Understanding       Chronic Lung Disease Review: - Group verbal instruction with posters, models, PowerPoint presentations and videos,  to review new updates, new respiratory medications, new advancements in procedures and treatments. Providing information on websites and "800" numbers for continued self-education. Includes information about supplement oxygen, available portable oxygen systems, continuous and intermittent flow rates, oxygen safety, concentrators, and Medicare reimbursement for oxygen. Explanation of Pulmonary Drugs, including class, frequency, complications, importance of spacers, rinsing mouth after steroid MDI's, and proper cleaning methods for nebulizers. Review of basic lung anatomy and physiology related to function, structure, and complications of lung disease. Review of risk factors. Discussion about methods for diagnosing sleep apnea and types of masks and machines for OSA. Includes a review of the use of types of environmental controls: home humidity, furnaces, filters, dust mite/pet prevention, HEPA vacuums. Discussion about weather changes, air quality and the benefits of nasal washing. Instruction  on Warning signs, infection symptoms, calling MD promptly, preventive modes, and value of vaccinations. Review of effective airway clearance, coughing and/or vibration techniques. Emphasizing that all should Create an Action Plan. Written material given at graduation. Flowsheet Row Pulmonary Rehab from 12/15/2020 in [facility] Cardiac and Pulmonary Rehab  Education need identified 12/15/20       AED/CPR: - Group verbal and written instruction with the use of models to demonstrate the basic use of the AED with the basic ABC's of resuscitation.    Anatomy and Cardiac Procedures: - Group verbal and visual presentation and models provide information about basic cardiac anatomy and function. Reviews the testing methods done to diagnose heart disease and the outcomes of the test results. Describes the treatment choices: Medical Management, Angioplasty, or Coronary Bypass Surgery for treating various heart conditions including Myocardial Infarction, Angina, Valve Disease, and Cardiac Arrhythmias.  Written material given at graduation.   Medication Safety: - Group verbal and visual instruction to review commonly prescribed medications for heart and lung disease. Reviews the medication, class of the drug, and side effects. Includes the steps to properly store meds and maintain the prescription regimen.  Written material given at graduation.   Other: -Provides group and verbal instruction on various topics (see comments)   Knowledge Questionnaire Score:  Knowledge Questionnaire Score - 12/15/20 1429       Knowledge Questionnaire Score   Pre Score 16/18: Lung disease, Oxygen              Core Components/Risk Factors/Patient Goals at Admission:  Personal Goals and Risk Factors at Admission - 12/15/20 1436       Core Components/Risk Factors/Patient Goals on Admission    Weight Management Yes;Weight Loss;Obesity    Intervention Weight Management: Develop a combined nutrition and exercise  program designed to reach desired caloric intake, while maintaining appropriate intake of nutrient and fiber, sodium and fats, and appropriate energy expenditure required for the weight goal.;Weight Management: Provide education and appropriate resources to help participant work on and attain dietary goals.    Admit Weight 208 lb (  94.3 kg)    Goal Weight: Short Term 203 lb (92.1 kg)    Goal Weight: Long Term 190 lb (86.2 kg)    Expected Outcomes Short Term: Continue to assess and modify interventions until short term weight is achieved;Long Term: Adherence to nutrition and physical activity/exercise program aimed toward attainment of established weight goal;Weight Loss: Understanding of general recommendations for a balanced deficit meal plan, which promotes 1-2 lb weight loss per week and includes a negative energy balance of [id number] kcal/d;Understanding recommendations for meals to include 15-35% energy as protein, 25-35% energy from fat, 35-60% energy from carbohydrates, less than 200mg  of dietary cholesterol, 20-35 gm of total fiber daily;Understanding of distribution of calorie intake throughout the day with the consumption of 4-5 meals/snacks    Improve shortness of breath with ADL's Yes    Intervention Provide education, individualized exercise plan and daily activity instruction to help decrease symptoms of SOB with activities of daily living.    Expected Outcomes Short Term: Improve cardiorespiratory fitness to achieve a reduction of symptoms when performing ADLs;Long Term: Be able to perform more ADLs without symptoms or delay the onset of symptoms    Diabetes Yes    Intervention Provide education about signs/symptoms and action to take for hypo/hyperglycemia.;Provide education about proper nutrition, including hydration, and aerobic/resistive exercise prescription along with prescribed medications to achieve blood glucose in normal ranges: Fasting glucose 65-99 mg/dL    Expected Outcomes Short  Term: Participant verbalizes understanding of the signs/symptoms and immediate care of hyper/hypoglycemia, proper foot care and importance of medication, aerobic/resistive exercise and nutrition plan for blood glucose control.;Long Term: Attainment of HbA1C < 7%.    Hypertension Yes    Intervention Provide education on lifestyle modifcations including regular physical activity/exercise, weight management, moderate sodium restriction and increased consumption of fresh fruit, vegetables, and low fat dairy, alcohol moderation, and smoking cessation.;Monitor prescription use compliance.    Expected Outcomes Short Term: Continued assessment and intervention until BP is < 140/50mm HG in hypertensive participants. < 130/15mm HG in hypertensive participants with diabetes, heart failure or chronic kidney disease.;Long Term: Maintenance of blood pressure at goal levels.    Lipids Yes    Intervention Provide education and support for participant on nutrition & aerobic/resistive exercise along with prescribed medications to achieve LDL 70mg , HDL >40mg .    Expected Outcomes Short Term: Participant states understanding of desired cholesterol values and is compliant with medications prescribed. Participant is following exercise prescription and nutrition guidelines.;Long Term: Cholesterol controlled with medications as prescribed, with individualized exercise RX and with personalized nutrition plan. Value goals: LDL < 70mg , HDL > 40 mg.             Education:Diabetes - Individual verbal and written instruction to review signs/symptoms of diabetes, desired ranges of glucose level fasting, after meals and with exercise. Acknowledge that pre and post exercise glucose checks will be done for 3 sessions at entry of program. Flowsheet Row Pulmonary Rehab from 12/12/2020 in [facility] Cardiac and Pulmonary Rehab  Date 12/12/20  Educator [facility]  Instruction Review Code 1- Verbalizes Understanding       Know Your Numbers and  Heart Failure: - Group verbal and visual instruction to discuss disease risk factors for cardiac and pulmonary disease and treatment options.  Reviews associated critical values for Overweight/Obesity, Hypertension, Cholesterol, and Diabetes.  Discusses basics of heart failure: signs/symptoms and treatments.  Introduces Heart Failure Zone chart for action plan for heart failure.  Written material given at graduation.   Core  Components/Risk Factors/Patient Goals Review:    Core Components/Risk Factors/Patient Goals at Discharge (Final Review):    ITP Comments:  ITP Comments     Row Name 12/12/20 1341 12/15/20 1204 12/21/20 1001 12/28/20 0629     ITP Comments Completed virtual orientation today.  EP evaluation is scheduled for Thursday 10/20 at 8am.  Documentation for diagnosis can be found in CE encounter 11/24/20. Completed 6MWT and gym orientation. Initial ITP created and sent for review to Dr. Ottie Glazier, Medical Director. First full day of exercise!  Patient was oriented to gym and equipment including functions, settings, policies, and procedures.  Patient's individual exercise prescription and treatment plan were reviewed.  All starting workloads were established based on the results of the 6 minute walk test done at initial orientation visit.  The plan for exercise progression was also introduced and progression will be customized based on patient's performance and goals. 30 Day review completed. Medical Director ITP review done, changes made as directed, and signed approval by Medical Director.    New to program             Comments:  30 Day review completed. Medical Director ITP review done, changes made as directed, and signed approval by Medical Director.

## 2020-12-30 ENCOUNTER — Encounter: Payer: Medicare HMO | Admitting: *Deleted

## 2020-12-30 ENCOUNTER — Other Ambulatory Visit: Payer: Self-pay

## 2020-12-30 DIAGNOSIS — J45909 Unspecified asthma, uncomplicated: Secondary | ICD-10-CM

## 2020-12-30 NOTE — Progress Notes (Signed)
Daily Session Note  Patient Details  Name: Madeline Martinez MRN: [id number] Date of Birth: 04/11/1945 Referring Provider:   Flowsheet Row Pulmonary Rehab from 12/15/2020 in [facility] Cardiac and Pulmonary Rehab  Referring Provider Ottie Glazier MD       Encounter Date: 12/30/2020  Check In:  Session Check In - 12/30/20 1036       Check-In   Supervising physician immediately available to respond to emergencies See telemetry face sheet for immediately available ER MD    Location [facility]-Cardiac & Pulmonary Rehab    Staff Present Heath Lark, RN, BSN, CCRP;Jessica Dalton, MA, RCEP, CCRP, CCET;Joseph Gaastra, [place]    Virtual Visit No    Medication changes reported     No    Fall or balance concerns reported    No    Warm-up and Cool-down Performed on first and last piece of equipment    Resistance Training Performed Yes    VAD Patient? No    PAD/SET Patient? No      Pain Assessment   Currently in Pain? No/denies                Social History   Tobacco Use  Smoking Status Never  Smokeless Tobacco Never    Goals Met:  Proper associated with RPD/PD & O2 Sat Independence with exercise equipment Exercise tolerated well No report of concerns or symptoms today  Goals Unmet:  Not Applicable  Comments: Pt able to follow exercise prescription today without complaint.  Will continue to monitor for progression.    Dr. Emily Filbert is Medical Director for [place].  Dr. Ottie Glazier is Medical Director for [facility] Pulmonary Rehabilitation.

## 2021-01-02 ENCOUNTER — Encounter: Payer: Medicare HMO | Admitting: *Deleted

## 2021-01-02 ENCOUNTER — Other Ambulatory Visit: Payer: Self-pay

## 2021-01-02 DIAGNOSIS — J45909 Unspecified asthma, uncomplicated: Secondary | ICD-10-CM | POA: Diagnosis not present

## 2021-01-02 NOTE — Progress Notes (Signed)
Completed initial consultation, scheduled follow-up 11/21 at 915am.

## 2021-01-02 NOTE — Progress Notes (Signed)
Daily Session Note  Patient Details  Name: Madeline Martinez MRN: [id number] Date of Birth: 1945-03-21 Referring Provider:   Flowsheet Row Pulmonary Rehab from 12/15/2020 in [facility] Cardiac and Pulmonary Rehab  Referring Provider Ottie Glazier MD       Encounter Date: 01/02/2021  Check In:  Session Check In - 01/02/21 1016       Check-In   Supervising physician immediately available to respond to emergencies See telemetry face sheet for immediately available ER MD    Location [facility]-Cardiac & Pulmonary Rehab    Staff Present Renita Papa, RN Moises Blood, BS, ACSM CEP, Exercise Physiologist;Amanda Oletta Darter, [place], ACSM CEP, Exercise Physiologist    Virtual Visit No    Medication changes reported     No    Fall or balance concerns reported    No    Warm-up and Cool-down Performed on first and last piece of equipment    Resistance Training Performed Yes    VAD Patient? No    PAD/SET Patient? No      Pain Assessment   Currently in Pain? No/denies                Social History   Tobacco Use  Smoking Status Never  Smokeless Tobacco Never    Goals Met:  Independence with exercise equipment Exercise tolerated well No report of concerns or symptoms today Strength training completed today  Goals Unmet:  Not Applicable  Comments: Pt able to follow exercise prescription today without complaint.  Will continue to monitor for progression.    Dr. Emily Filbert is Medical Director for [place].  Dr. Ottie Glazier is Medical Director for [facility] Pulmonary Rehabilitation.

## 2021-01-04 ENCOUNTER — Other Ambulatory Visit: Payer: Self-pay

## 2021-01-04 DIAGNOSIS — J45909 Unspecified asthma, uncomplicated: Secondary | ICD-10-CM

## 2021-01-04 NOTE — Progress Notes (Signed)
Daily Session Note  Patient Details  Name: Madeline Martinez MRN: [id number] Date of Birth: 04-Oct-1945 Referring Provider:   Flowsheet Row Pulmonary Rehab from 12/15/2020 in [facility] Cardiac and Pulmonary Rehab  Referring Provider Ottie Glazier MD       Encounter Date: 01/04/2021  Check In:  Session Check In - 01/04/21 0951       Check-In   Supervising physician immediately available to respond to emergencies See telemetry face sheet for immediately available ER MD    Location [facility]-Cardiac & Pulmonary Rehab    Staff Present Birdie Sons, MPA, RN;Joseph Seligman, RCP,RRT,BSRT;Jessica Sheldon, MA, RCEP, CCRP, CCET    Virtual Visit No    Medication changes reported     No    Fall or balance concerns reported    No    Warm-up and Cool-down Performed on first and last piece of equipment    Resistance Training Performed Yes    VAD Patient? No    PAD/SET Patient? No      Pain Assessment   Currently in Pain? No/denies                Social History   Tobacco Use  Smoking Status Never  Smokeless Tobacco Never    Goals Met:  Independence with exercise equipment Exercise tolerated well No report of concerns or symptoms today Strength training completed today  Goals Unmet:  Not Applicable  Comments: Pt able to follow exercise prescription today without complaint.  Will continue to monitor for progression.    Dr. Emily Filbert is Medical Director for [place].  Dr. Ottie Glazier is Medical Director for [facility] Pulmonary Rehabilitation.

## 2021-01-16 ENCOUNTER — Encounter: Payer: Medicare HMO | Admitting: *Deleted

## 2021-01-16 ENCOUNTER — Other Ambulatory Visit: Payer: Self-pay

## 2021-01-16 DIAGNOSIS — J45909 Unspecified asthma, uncomplicated: Secondary | ICD-10-CM | POA: Diagnosis not present

## 2021-01-16 NOTE — Progress Notes (Signed)
Completed RD follow up

## 2021-01-16 NOTE — Progress Notes (Signed)
Daily Session Note  Patient Details  Name: Madeline Martinez MRN: [id number] Date of Birth: 28-Feb-1945 Referring Provider:   Flowsheet Row Pulmonary Rehab from 12/15/2020 in [facility] Cardiac and Pulmonary Rehab  Referring Provider Ottie Glazier MD       Encounter Date: 01/16/2021  Check In:  Session Check In - 01/16/21 1034       Check-In   Supervising physician immediately available to respond to emergencies See telemetry face sheet for immediately available ER MD    Location [facility]-Cardiac & Pulmonary Rehab    Staff Present Renita Papa, RN Moises Blood, BS, ACSM CEP, Exercise Physiologist;Amanda Oletta Darter, [place], ACSM CEP, Exercise Physiologist    Virtual Visit No    Medication changes reported     No    Fall or balance concerns reported    No    Warm-up and Cool-down Performed on first and last piece of equipment    Resistance Training Performed Yes    VAD Patient? No    PAD/SET Patient? No      Pain Assessment   Currently in Pain? No/denies                Social History   Tobacco Use  Smoking Status Never  Smokeless Tobacco Never    Goals Met:  Independence with exercise equipment Exercise tolerated well No report of concerns or symptoms today Strength training completed today  Goals Unmet:  Not Applicable  Comments: Pt able to follow exercise prescription today without complaint.  Will continue to monitor for progression.    Dr. Emily Filbert is Medical Director for [place].  Dr. Ottie Glazier is Medical Director for [facility] Pulmonary Rehabilitation.

## 2021-01-18 ENCOUNTER — Other Ambulatory Visit: Payer: Self-pay

## 2021-01-18 DIAGNOSIS — J45909 Unspecified asthma, uncomplicated: Secondary | ICD-10-CM

## 2021-01-18 NOTE — Progress Notes (Signed)
Daily Session Note  Patient Details  Name: Madeline Martinez MRN: [id number] Date of Birth: 10-27-1945 Referring Provider:   Flowsheet Row Pulmonary Rehab from 12/15/2020 in [facility] Card[facility] Rehab  Referring Provider Ottie Glazier MD       Encounter Date: 01/18/2021  Check In:  Session Check In - 01/18/21 0939       Check-In   Supervising physician immediately available to respond to emergencies See telemetry face sheet for immediately available ER MD    Location [facility]-Cardiac & Pulmonary Rehab    Staff Present Birdie Sons, MPA, Elveria Rising, BA, ACSM CEP, Exercise Physiologist;Joseph Tessie Fass, [place]    Virtual Visit No    Medication changes reported     No    Fall or balance concerns reported    No    Warm-up and Cool-down Performed on first and last piece of equipment    Resistance Training Performed Yes    VAD Patient? No    PAD/SET Patient? No      Pain Assessment   Currently in Pain? No/denies                Social History   Tobacco Use  Smoking Status Never  Smokeless Tobacco Never    Goals Met:  Independence with exercise equipment Exercise tolerated well No report of concerns or symptoms today Strength training completed today  Goals Unmet:  Not Applicable  Comments: Pt able to follow exercise prescription today without complaint.  Will continue to monitor for progression.    Dr. Emily Filbert is Medical Director for [facility].  Dr. Ottie Glazier is Medical Director for [facility] Pulmonary Rehabilitation.

## 2021-01-23 ENCOUNTER — Other Ambulatory Visit: Payer: Self-pay

## 2021-01-23 ENCOUNTER — Encounter: Payer: Medicare HMO | Admitting: *Deleted

## 2021-01-23 DIAGNOSIS — J45909 Unspecified asthma, uncomplicated: Secondary | ICD-10-CM | POA: Diagnosis not present

## 2021-01-23 NOTE — Progress Notes (Signed)
Daily Session Note  Patient Details  Name: Madeline Martinez MRN: [id number] Date of Birth: [birth year] Referring Provider:   Flowsheet Row Pulmonary Rehab from 12/15/2020 in [facility] Cardiac and Pul[facility] Rehab  Referring Provider Ottie Glazier MD       Encounter Date: 01/23/2021  Check In:  Session Check In - 01/23/21 1022       Check-In   Supervising physician immediately available to respond to emergencies See telemetry face sheet for immediately available ER MD    Location [facility]-Cardiac & Pulmonary Rehab    Staff Present Renita Papa, RN Vickki Hearing, BA, ACSM CEP, Exercise Physiologist;Kelly Amedeo Plenty, BS, ACSM CEP, Exercise Physiologist    Virtual Visit No    Medication changes reported     No    Fall or balance concerns reported    No    Warm-up and Cool-down Performed on first and last piece of equipment    Resistance Training Performed Yes    VAD Patient? No    PAD/SET Patient? No      Pain Assessment   Currently in Pain? No/denies                Social History   Tobacco Use  Smoking Status Never  Smokeless Tobacco Never    Goals Met:  Independence with exercise equipment Exercise tolerated well No report of concerns or symptoms today Strength training completed today  Goals Unmet:  Not Applicable  Comments: Pt able to follow exercise prescription today without complaint.  Will continue to monitor for progression.    Dr. Emily Filbert is Medical Director for [facility].  Dr. Ottie Glazier is Medical Director for [facility] Pulmonary Rehabilitation.

## 2021-01-25 ENCOUNTER — Encounter: Payer: Self-pay | Admitting: *Deleted

## 2021-01-25 DIAGNOSIS — J45909 Unspecified asthma, uncomplicated: Secondary | ICD-10-CM

## 2021-01-25 NOTE — Progress Notes (Signed)
Pulmonary Individual Treatment Plan  Patient Details  Name: Madeline Martinez MRN: [id number] Date of Birth: [birth year] Referring Provider:   Flowsheet Row Pulmonary Rehab from 12/15/2020 in [facility] Cardiac and Pulmonary Rehab  Referring Provider Ottie Glazier MD       Initial Encounter Date:  Flowsheet Row Pulmonary Rehab from 12/15/2020 in [facility] Cardiac and Pulmonary Rehab  Date 12/15/20       Visit Diagnosis: Asthma, unspecified asthma severity, unspecified whether complicated, unspecified whether persistent  Patient's Home Medications on Admission:  Current Outpatient Medications:    allopurinol (ZYLOPRIM) 100 MG tablet, Take 100 mg by mouth daily with lunch. , Disp: , Rfl:    aspirin 81 MG EC tablet, Take by mouth., Disp: , Rfl:    atorvastatin (LIPITOR) 80 MG tablet, Take 80 mg by mouth daily., Disp: , Rfl:    Biotin 5000 MCG TABS, Take 5,000 mcg by mouth daily. HAIR/SKIN/NAILS, Disp: , Rfl:    carvedilol (COREG) 12.5 MG tablet, Take 12.5 mg by mouth 2 (two) times daily., Disp: , Rfl:    cetirizine (ZYRTEC) 10 MG tablet, Take 10 mg by mouth daily.  (Patient not taking: Reported on 12/12/2020), Disp: , Rfl:    Cholecalciferol (VITAMIN D-3) 125 MCG (5000 UT) TABS, Take 5,000 Units by mouth daily., Disp: , Rfl:    DEXILANT 60 MG capsule, Take 1 capsule by mouth daily. (Patient not taking: Reported on 12/12/2020), Disp: , Rfl:    ferrous sulfate 325 (65 FE) MG tablet, Take 325 mg by mouth daily., Disp: , Rfl:    FLUoxetine (PROZAC) 40 MG capsule, Take 40 mg by mouth daily., Disp: , Rfl:    gabapentin (NEURONTIN) 400 MG capsule, Take 400 mg by mouth daily. (Patient not taking: Reported on 12/12/2020), Disp: , Rfl:    glipiZIDE (GLUCOTROL XL) 5 MG 24 hr tablet, Take 5 mg by mouth 3 (three) times daily., Disp: , Rfl:    hydrochlorothiazide (HYDRODIURIL) 25 MG tablet, Take 25 mg by mouth daily., Disp: , Rfl:    HYDROcodone-acetaminophen (NORCO/VICODIN) 5-325 MG tablet, Take 1 tablet by  mouth daily as needed (pain.). , Disp: , Rfl:    levothyroxine (SYNTHROID) 25 MCG tablet, Take 25 mcg by mouth daily., Disp: , Rfl:    lidocaine (LIDODERM) 5 %, Place 1 patch onto the skin daily. Remove & Discard patch within 12 hours or as directed by MD, Disp: 15 patch, Rfl: 0   LINZESS 290 MCG CAPS capsule, TAKE 1 CAPSULE BY MOUTH DAILY BEFORE BREAKFAST., Disp: 90 capsule, Rfl: 1   LORazepam (ATIVAN) 1 MG tablet, Take 1 mg by mouth 4 (four) times daily. , Disp: , Rfl:    losartan (COZAAR) 100 MG tablet, Take 100 mg by mouth daily., Disp: , Rfl:    meloxicam (MOBIC) 15 MG tablet, Take 15 mg by mouth daily. (Patient not taking: No sig reported), Disp: , Rfl:    metFORMIN (GLUCOPHAGE) 1000 MG tablet, Take 1,000 mg by mouth 2 (two) times daily., Disp: , Rfl:    methocarbamol (ROBAXIN) 750 MG tablet, Take 750 mg by mouth daily with lunch.  (Patient not taking: No sig reported), Disp: , Rfl:    mirabegron ER (MYRBETRIQ) 50 MG TB24 tablet, Take 1 tablet (50 mg total) by mouth daily., Disp: 90 tablet, Rfl: 3   mirtazapine (REMERON) 15 MG tablet, Take 15 mg by mouth at bedtime. , Disp: , Rfl:    omeprazole (PRILOSEC) 20 MG capsule, TAKE 2 CAPSULES (40 MG TOTAL) BY MOUTH 2 (  TWO) TIMES DAILY BEFORE A MEAL., Disp: 180 capsule, Rfl: 1   oxybutynin (DITROPAN-XL) 10 MG 24 hr tablet, TAKE 1 TABLET BY MOUTH EVERY DAY, Disp: 90 tablet, Rfl: 3   oxyCODONE-acetaminophen (PERCOCET/ROXICET) 5-325 MG tablet, Take 1 tablet by mouth every 4 (four) hours as needed., Disp: 15 tablet, Rfl: 0   pregabalin (LYRICA) 75 MG capsule, Take 75 mg by mouth 3 (three) times daily., Disp: , Rfl:    Probiotic Product (PROBIOTIC PO), Take 1 tablet by mouth daily. (Patient not taking: Reported on 12/12/2020), Disp: , Rfl:    TOUJEO SOLOSTAR 300 UNIT/ML SOPN, Inject 20 Units into the skin daily. , Disp: , Rfl:    vitamin E 45 MG (100 UNITS) capsule, Take by mouth., Disp: , Rfl:   Past Medical History: Past Medical History:  Diagnosis  Date   Acid reflux    Anxiety    Depression    Diabetes mellitus    Dysrhythmia    Gout    Heart murmur    HTN (hypertension)    Hyperlipidemia    Sleep apnea     Tobacco Use: Social History   Tobacco Use  Smoking Status Never  Smokeless Tobacco Never    Labs: Recent Review Flowsheet Data     Labs for ITP Cardiac and Pulmonary Rehab Latest Ref Rng & Units 02/14/2010 04/01/2020   Cholestrol 0 - 200 mg/dL 217(A) -   LDLCALC mg/dL 135 -   HDL 35 - 70 mg/dL 61 -   Trlycerides 40 - 160 mg/dL 103 -   TCO2 22 - 32 mmol/L - 27        Pulmonary Assessment Scores:  Pulmonary Assessment Scores     Row Name 12/15/20 1427         ADL UCSD   ADL Phase Entry     SOB Score total 59     Rest 0     Walk 5     Stairs 5     Bath 0     Dress 5     Shop 4       CAT Score   CAT Score 37       mMRC Score   mMRC Score 3              UCSD: Self-administered rating of dyspnea associated with activities of daily living (ADLs) 6-point scale (0 = "not at all" to 5 = "maximal or unable to do because of breathlessness")  Scoring Scores range from 0 to 120.  Minimally important difference is 5 units  CAT: CAT can identify the health impairment of COPD patients and is better correlated with disease progression.  CAT has a scoring range of zero to 40. The CAT score is classified into four groups of low (less than 10), medium (10 - 20), high (21-30) and very high (31-40) based on the impact level of disease on health status. A CAT score over 10 suggests significant symptoms.  A worsening CAT score could be explained by an exacerbation, poor medication adherence, poor inhaler technique, or progression of COPD or comorbid conditions.  CAT MCID is 2 points  mMRC: mMRC (Modified Medical Research Council) Dyspnea Scale is used to assess the degree of baseline functional disability in patients of respiratory disease due to dyspnea. No minimal important difference is established. A  decrease in score of 1 point or greater is considered a positive change.   Pulmonary Function Assessment:   Exercise Target Goals: Exercise Program Goal:  Individual exercise prescription set using results from initial 6 min walk test and THRR while considering  patient's activity barriers and safety.   Exercise Prescription Goal: Initial exercise prescription builds to 30-45 minutes a day of aerobic activity, 2-3 days per week.  Home exercise guidelines will be given to patient during program as part of exercise prescription that the participant will acknowledge.  Education: Aerobic Exercise: - Group verbal and visual presentation on the components of exercise prescription. Introduces F.I.T.T principle from ACSM for exercise prescriptions.  Reviews F.I.T.T. principles of aerobic exercise including progression. Written material given at graduation.   Education: Resistance Exercise: - Group verbal and visual presentation on the components of exercise prescription. Introduces F.I.T.T principle from ACSM for exercise prescriptions  Reviews F.I.T.T. principles of resistance exercise including progression. Written material given at graduation. Flowsheet Row Pulmonary Rehab from 01/18/2021 in [facility] Cardiac and Pulmonary Rehab  Date 12/28/20  Educator [facility]  Instruction Review Code 1- Verbalizes Understanding        Education: Exercise & Equipment Safety: - Individual verbal instruction and demonstration of equipment use and safety with use of the equipment. Flowsheet Row Pulmonary Rehab from 01/18/2021 in [facility] Cardiac and Pulmonary Rehab  Education need identified 12/15/20  Date 12/15/20  Educator [place]  Instruction Review Code 1- Verbalizes Understanding       Education: Exercise Physiology & General Exercise Guidelines: - Group verbal and written instruction with models to review the exercise physiology of the cardiovascular system and associated critical values. Provides general exercise  guidelines with specific guidelines to those with heart or lung disease.    Education: Flexibility, Balance, Mind/Body Relaxation: - Group verbal and visual presentation with interactive activity on the components of exercise prescription. Introduces F.I.T.T principle from ACSM for exercise prescriptions. Reviews F.I.T.T. principles of flexibility and balance exercise training including progression. Also discusses the mind body connection.  Reviews various relaxation techniques to help reduce and manage stress (i.e. Deep breathing, progressive muscle relaxation, and visualization). Balance handout provided to take home. Written material given at graduation. Flowsheet Row Pulmonary Rehab from 01/18/2021 in [facility] Cardiac and Pulmonary Rehab  Date 01/04/21  Educator [facility]  Instruction Review Code 1- Verbalizes Understanding       Activity Barriers & Risk Stratification:  Activity Barriers & Cardiac Risk Stratification - 12/15/20 1425       Activity Barriers & Cardiac Risk Stratification   Activity Barriers Back Problems;Other (comment);Shortness of Breath;Muscular Weakness;Deconditioning;Joint Problems;Balance Concerns;History of Falls    Comments chronic back pain, hard for her to walk distance, occasional knee pain             6 Minute Walk:  6 Minute Walk     Row Name 12/15/20 1431         6 Minute Walk   Phase Initial     Distance 90 feet     Walk Time 4 minutes  Break 1:54-4:10     # of Rest Breaks 1     MPH 0.17     METS 0.1     RPE 15     Perceived Dyspnea  3     VO2 Peak 0.35     Symptoms Yes (comment)     Comments SOB, Right knee pain 5/10     Resting HR 89 bpm     Resting BP 140/70     Resting Oxygen Saturation  94 %     Exercise Oxygen Saturation  during 6 min walk 93 %  Max Ex. HR 102 bpm     Max Ex. BP 162/72     2 Minute Post BP 144/74       Interval HR   1 Minute HR 102     2 Minute HR 98     3 Minute HR 99     4 Minute HR 97     5 Minute HR 102      6 Minute HR 88     2 Minute Post HR 84     Interval Heart Rate? Yes       Interval Oxygen   Interval Oxygen? Yes     Baseline Oxygen Saturation % 94 %     1 Minute Oxygen Saturation % 93 %     1 Minute Liters of Oxygen 0 L  RA     2 Minute Oxygen Saturation % 95 %     2 Minute Liters of Oxygen 0 L     3 Minute Oxygen Saturation % 94 %     3 Minute Liters of Oxygen 0 L     4 Minute Oxygen Saturation % 97 %     4 Minute Liters of Oxygen 0 L     5 Minute Oxygen Saturation % 96 %     5 Minute Liters of Oxygen 0 L     6 Minute Oxygen Saturation % 95 %     6 Minute Liters of Oxygen 0 L     2 Minute Post Oxygen Saturation % 96 %     2 Minute Post Liters of Oxygen 0 L             Oxygen Initial Assessment:  Oxygen Initial Assessment - 01/18/21 1048       Home Oxygen   Home Oxygen Device None    Sleep Oxygen Prescription None    Home Exercise Oxygen Prescription None    Home Resting Oxygen Prescription None      Initial 6 min Walk   Oxygen Used None      Program Oxygen Prescription   Program Oxygen Prescription None;Continuous    Liters per minute 2   prn     Intervention   Short Term Goals To learn and exhibit compliance with exercise, home and travel O2 prescription;To learn and understand importance of monitoring SPO2 with pulse oximeter and demonstrate accurate use of the pulse oximeter.;To learn and understand importance of maintaining oxygen saturations>88%;To learn and demonstrate proper pursed lip breathing techniques or other breathing techniques.     Long  Term Goals Exhibits compliance with exercise, home  and travel O2 prescription;Verbalizes importance of monitoring SPO2 with pulse oximeter and return demonstration;Maintenance of O2 saturations>88%;Exhibits proper breathing techniques, such as pursed lip breathing or other method taught during program session;Compliance with respiratory medication             Oxygen Re-Evaluation:  Oxygen Re-Evaluation      [place] Name 12/21/20 1002 01/18/21 1048           Program Oxygen Prescription   Program Oxygen Prescription Continuous --      Liters per minute 2 --        Home Oxygen   Home Oxygen Device None --      Sleep Oxygen Prescription None --      Home Exercise Oxygen Prescription None --      Home Resting Oxygen Prescription None --        Goals/Expected Outcomes   Short Term  Goals To learn and exhibit compliance with exercise, home and travel O2 prescription;To learn and understand importance of monitoring SPO2 with pulse oximeter and demonstrate accurate use of the pulse oximeter.;To learn and understand importance of maintaining oxygen saturations>88%;To learn and demonstrate proper pursed lip breathing techniques or other breathing techniques.  --      Long  Term Goals Exhibits compliance with exercise, home  and travel O2 prescription;Verbalizes importance of monitoring SPO2 with pulse oximeter and return demonstration;Maintenance of O2 saturations>88%;Exhibits proper breathing techniques, such as pursed lip breathing or other method taught during program session;Compliance with respiratory medication --      Comments Reviewed PLB technique with pt.  Talked about how it works and it's importance in maintaining their exercise saturations. Encouraged patient to use pulse ox at home to check that oxygen is staying above 88%. She uses PLB when needed and trying to move more at home.      Goals/Expected Outcomes Short: Become more profiecient at using PLB.   Long: Become independent at using PLB. Short: Check O2 at home continuously   Long: Become independent at using PLB.               Oxygen Discharge (Final Oxygen Re-Evaluation):  Oxygen Re-Evaluation - 01/18/21 1048       Goals/Expected Outcomes   Comments Encouraged patient to use pulse ox at home to check that oxygen is staying above 88%. She uses PLB when needed and trying to move more at home.    Goals/Expected Outcomes Short:  Check O2 at home continuously   Long: Become independent at using PLB.             Initial Exercise Prescription:  Initial Exercise Prescription - 12/15/20 1400       Date of Initial Exercise RX and Referring Provider   Date 12/15/20    Referring Provider Ottie Glazier MD      Oxygen   Oxygen Continuous    Liters 2   when available   Maintain Oxygen Saturation 88% or higher      Recumbant Bike   Level 1    RPM 60    Minutes 15    METs 1      NuStep   Level 1    SPM 80    Minutes 15    METs 1      REL-XR   Level 1    Speed 50    Minutes 15    METs 1      Prescription Details   Frequency (times per week) 3    Duration Progress to 30 minutes of continuous aerobic without signs/symptoms of physical distress      Intensity   THRR 40-80% of Max Heartrate 111-134    Ratings of Perceived Exertion 11-13    Perceived Dyspnea 0-4      Progression   Progression Continue to progress workloads to maintain intensity without signs/symptoms of physical distress.      Resistance Training   Training Prescription Yes    Weight 2 lb    Reps 10-15             Perform Capillary Blood Glucose checks as needed.  Exercise Prescription Changes:   Exercise Prescription Changes     Row Name 12/15/20 1400 12/26/20 0900 01/09/21 1400 01/23/21 1000       Response to Exercise   Blood Pressure (Admit) 140/70 134/72 142/60 148/78    Blood Pressure (Exercise) 162/72 142/76 -- 138/76  Blood Pressure (Exit) 144/74 118/60 142/82 116/78    Heart Rate (Admit) 89 bpm 97 bpm 101 bpm 100 bpm    Heart Rate (Exercise) 102 bpm 104 bpm 104 bpm 95 bpm    Heart Rate (Exit) 84 bpm 89 bpm 95 bpm 90 bpm    Oxygen Saturation (Admit) 94 % 96 % 94 % 96 %    Oxygen Saturation (Exercise) 93 % 94 % 94 % 94 %    Oxygen Saturation (Exit) 96 % 96 % 94 % 95 %    Rating of Perceived Exertion (Exercise) '15 15 15 15    ' Perceived Dyspnea (Exercise) '3 3 3 3    ' Symptoms SOB, right knee pain 5/10  SOB SOB SOB    Comments walk test results second full day of exercise -- --    Duration -- Progress to 30 minutes of  aerobic without signs/symptoms of physical distress Progress to 30 minutes of  aerobic without signs/symptoms of physical distress Progress to 30 minutes of  aerobic without signs/symptoms of physical distress    Intensity -- THRR unchanged THRR unchanged THRR unchanged      Progression   Progression -- Continue to progress workloads to maintain intensity without signs/symptoms of physical distress. Continue to progress workloads to maintain intensity without signs/symptoms of physical distress. Continue to progress workloads to maintain intensity without signs/symptoms of physical distress.    Average METs -- 1.25 1.6 1.5      Resistance Training   Training Prescription -- Yes Yes Yes    Weight -- 2 lb 2 lb 2 lb    Reps -- 10-15 10-15 10-15      Interval Training   Interval Training -- No No No      Oxygen   Oxygen -- --  Currently using Room Air Continuous --    Liters -- -- 2 --      NuStep   Level -- '1 4 4    ' Minutes -- '30 30 30    ' METs -- 1.5 1.6 2      Biostep-RELP   Level -- 1 -- 1    Minutes -- 30 -- 30    METs -- 1 -- 1      Oxygen   Maintain Oxygen Saturation -- 88% or higher 88% or higher 88% or higher             Exercise Comments:   Exercise Comments     Row Name 12/21/20 1001           Exercise Comments First full day of exercise!  Patient was oriented to gym and equipment including functions, settings, policies, and procedures.  Patient's individual exercise prescription and treatment plan were reviewed.  All starting workloads were established based on the results of the 6 minute walk test done at initial orientation visit.  The plan for exercise progression was also introduced and progression will be customized based on patient's performance and goals.                Exercise Goals and Review:   Exercise Goals     Row Name  12/15/20 1436             Exercise Goals   Increase Physical Activity Yes       Intervention Provide advice, education, support and counseling about physical activity/exercise needs.;Develop an individualized exercise prescription for aerobic and resistive training based on initial evaluation findings, risk stratification, comorbidities and participant's personal goals.  Expected Outcomes Short Term: Attend rehab on a regular basis to increase amount of physical activity.;Long Term: Add in home exercise to make exercise part of routine and to increase amount of physical activity.;Long Term: Exercising regularly at least 3-5 days a week.       Increase Strength and Stamina Yes       Intervention Provide advice, education, support and counseling about physical activity/exercise needs.;Develop an individualized exercise prescription for aerobic and resistive training based on initial evaluation findings, risk stratification, comorbidities and participant's personal goals.       Expected Outcomes Short Term: Increase workloads from initial exercise prescription for resistance, speed, and METs.;Short Term: Perform resistance training exercises routinely during rehab and add in resistance training at home;Long Term: Improve cardiorespiratory fitness, muscular endurance and strength as measured by increased METs and functional capacity (6MWT)       Able to understand and use rate of perceived exertion (RPE) scale Yes       Intervention Provide education and explanation on how to use RPE scale       Expected Outcomes Short Term: Able to use RPE daily in rehab to express subjective intensity level;Long Term:  Able to use RPE to guide intensity level when exercising independently       Able to understand and use Dyspnea scale Yes       Intervention Provide education and explanation on how to use Dyspnea scale       Expected Outcomes Short Term: Able to use Dyspnea scale daily in rehab to express  subjective sense of shortness of breath during exertion;Long Term: Able to use Dyspnea scale to guide intensity level when exercising independently       Knowledge and understanding of Target Heart Rate Range (THRR) Yes       Intervention Provide education and explanation of THRR including how the numbers were predicted and where they are located for reference       Expected Outcomes Short Term: Able to state/look up THRR;Long Term: Able to use THRR to govern intensity when exercising independently;Short Term: Able to use daily as guideline for intensity in rehab       Able to check pulse independently Yes       Intervention Provide education and demonstration on how to check pulse in carotid and radial arteries.;Review the importance of being able to check your own pulse for safety during independent exercise       Expected Outcomes Short Term: Able to explain why pulse checking is important during independent exercise;Long Term: Able to check pulse independently and accurately       Understanding of Exercise Prescription Yes       Intervention Provide education, explanation, and written materials on patient's individual exercise prescription       Expected Outcomes Short Term: Able to explain program exercise prescription;Long Term: Able to explain home exercise prescription to exercise independently                Exercise Goals Re-Evaluation :  Exercise Goals Re-Evaluation     Row Name 12/21/20 1001 12/26/20 0940 01/09/21 1412 01/18/21 1034 01/23/21 1017     Exercise Goal Re-Evaluation   Exercise Goals Review Able to understand and use rate of perceived exertion (RPE) scale;Increase Physical Activity;Knowledge and understanding of Target Heart Rate Range (THRR);Understanding of Exercise Prescription;Increase Strength and Stamina;Able to understand and use Dyspnea scale;Able to check pulse independently Increase Physical Activity;Increase Strength and Stamina;Understanding of Exercise  Prescription Increase Physical Activity;Increase  Strength and Stamina Increase Physical Activity;Increase Strength and Stamina;Understanding of Exercise Prescription Increase Physical Activity;Increase Strength and Stamina   Comments Reviewed RPE and dyspnea scales, THR and program prescription with pt today.  Pt voiced understanding and was given a copy of goals to take home. Anna-Marie is off to a good start in rehab.  She has completed her first two full days of exercise thus far.  We will continue to monitor her progress. Markan has increased to level 4 on NS.  Staff will inquire baout her trying to walk a little in class. Reviewed home exercise with pt today.  Pt does not have a plan for exercise but we discussed multiple options. Patient refuses to go to a public gym due to fear of getting sick. She is quite limited mobility wise and relies on her wheelchair significantly. Walking is limited for patient. I encouraged her to use the [facility] as it is a smaller gym and would be familiar with the equipment and be able to use machines that scale to her fitness level. Patient does not have any ability to exercise at home. She states she has an exercise ball to use at home but we talked about importance of incorporating aerobic as well. We also talked about having someone there with her balance and safety reasons.  Reviewed THR, pulse, RPE, sign and symptoms, pulse oximetery and when to call 911 or MD.  Also discussed weather considerations and indoor options.  Pt voiced understanding. Denaly is following her current exercise prescription as best as she can. she is very limited with walking and is only able to tolerate the T4 and BS at this time. She did however, increase to level 4 on the T4 Nustep. Her HR is not hitting her THRZ but O2 saturations are stable staying above 88%.  Will continue to monitor for progression once appropriate for patient.   Expected Outcomes Short: Use RPE daily to regulate intensity.  Long: Follow program prescription in THR. Short: Continue to attend rehab regularly Long: Continue to follow program prescription Short: try walking while in class Long: build overall stamina Short: Look into [employer] and start with 1 day of exercise at home Long: Obtain ability to exercise independently at appropriate prescription Short: Continue increasing loads on machines as tolerated Long: Continue to increase overall MET leve            Discharge Exercise Prescription (Final Exercise Prescription Changes):  Exercise Prescription Changes - 01/23/21 1000       Response to Exercise   Blood Pressure (Admit) 148/78    Blood Pressure (Exercise) 138/76    Blood Pressure (Exit) 116/78    Heart Rate (Admit) 100 bpm    Heart Rate (Exercise) 95 bpm    Heart Rate (Exit) 90 bpm    Oxygen Saturation (Admit) 96 %    Oxygen Saturation (Exercise) 94 %    Oxygen Saturation (Exit) 95 %    Rating of Perceived Exertion (Exercise) 15    Perceived Dyspnea (Exercise) 3    Symptoms SOB    Duration Progress to 30 minutes of  aerobic without signs/symptoms of physical distress    Intensity THRR unchanged      Progression   Progression Continue to progress workloads to maintain intensity without signs/symptoms of physical distress.    Average METs 1.5      Resistance Training   Training Prescription Yes    Weight 2 lb    Reps 10-15      Interval  Training   Interval Training No      NuStep   Level 4    Minutes 30    METs 2      Biostep-RELP   Level 1    Minutes 30    METs 1      Oxygen   Maintain Oxygen Saturation 88% or higher             Nutrition:  Target Goals: Understanding of nutrition guidelines, daily intake of sodium <1516m, cholesterol <2057m calories 30% from fat and 7% or less from saturated fats, daily to have 5 or more servings of fruits and vegetables.  Education: All About Nutrition: -Group instruction provided by verbal, written material, interactive  activities, discussions, models, and posters to present general guidelines for heart healthy nutrition including fat, fiber, MyPlate, the role of sodium in heart healthy nutrition, utilization of the nutrition label, and utilization of this knowledge for meal planning. Follow up email sent as well. Written material given at graduation. [facility]  Education need identified 12/15/20       Biometrics:  Pre Biometrics - 12/15/20 1425       Pre Biometrics   Height 5' 0.5" (1.537 m)    Weight 208 lb 9.6 oz (94.6 kg)    BMI (Calculated) 40.05              Nutrition Therapy Plan and Nutrition Goals:  Nutrition Therapy & Goals - 01/04/21 1435       Personal Nutrition Goals   Comments Daughter told RD that they have already started making changes like eating eggs, adding blue berries and blackberries to yogurt.             Nutrition Assessments:  MEDIFICTS Score Key: ?70 Need to make dietary changes  40-70 Heart Healthy Diet ? 40 Therapeutic Level Cholesterol Diet  [facility]  Picture Your Plate Total Score on Admission 49      Picture Your Plate Scores: <4<57nhealthy dietary pattern with much room for improvement. 41-50 Dietary pattern unlikely to meet recommendations for good health and room for improvement. 51-60 More healthful dietary pattern, with some room for improvement.  >60 Healthy dietary pattern, although there may be some specific behaviors that could be improved.   Nutrition Goals Re-Evaluation:  Nutrition Goals Re-Evaluation     [place] 01/16/21 1124             Goals   Nutrition Goal LT: limit saturated fat <12g/day, limit Na <1.5g/day, eat at least 5 servings of fruits/vegetables per day       Comment Daughter told RD that they have already started making changes like eating eggs, adding blue berries and  blackberries to yogurt a couple of weeks ago. Reviewed heart healthy eating and diabetes friendly eating from last visit. Discussed different fats this visit as ran out of time last visit. Daughter talked about reducing her [place] to 1 slice and reducing the amount of butter she uses with cooking.         Personal Goal #2 Re-Evaluation   Personal Goal #2 ST: limit processed meats to < 3x/week, 1 slice of [place] instead of two, reducing the amount of butter in half with cooking LT: limit saturated fat <12g/day, limit Na <1.5g/day         Personal Goal #3 Re-Evaluation   Personal Goal #3  ST: combine fiber/fat/protein like peaches canned in own juice or water with cottage cheese or nuts/seeds LT: eat at least 5 servings of fruits/vegetables per day                Nutrition Goals Discharge (Final Nutrition Goals Re-Evaluation):  Nutrition Goals Re-Evaluation - 01/16/21 1124       Goals   Nutrition Goal LT: limit saturated fat <12g/day, limit Na <1.5g/day, eat at least 5 servings of fruits/vegetables per day    Comment Daughter told RD that they have already started making changes like eating eggs, adding blue berries and blackberries to yogurt a couple of weeks ago. Reviewed heart healthy eating and diabetes friendly eating from last visit. Discussed different fats this visit as ran out of time last visit. Daughter talked about reducing her [place] bacon to 1 slice and reducing the amount of butter she uses with cooking.      Personal Goal #2 Re-Evaluation   Personal Goal #2 ST: limit processed meats to < 3x/week, 1 slice of [place] bacon instead of two, reducing the amount of butter in half with cooking LT: limit saturated fat <12g/day, limit Na <1.5g/day      Personal Goal #3 Re-Evaluation   Personal Goal #3 ST: combine fiber/fat/protein like peaches canned in own juice or water with cottage cheese or nuts/seeds LT: eat at least 5 servings of fruits/vegetables per day              Psychosocial: Target Goals: Acknowledge presence or absence of significant depression and/or stress, maximize coping skills, provide positive support system. Participant is able to verbalize types and ability to use techniques and skills needed for reducing stress and depression.   Education: Stress, Anxiety, and Depression - Group verbal and visual presentation to define topics covered.  Reviews how body is impacted by stress, anxiety, and depression.  Also discusses healthy ways to reduce stress and to treat/manage anxiety and depression.  Written material given at graduation.   Education: Sleep Hygiene -Provides group verbal and written instruction about how sleep can affect your health.  Define sleep hygiene, discuss sleep cycles and impact of sleep habits. Review good sleep hygiene tips.    Initial Review & Psychosocial Screening:  Initial Psych Review & Screening - 12/12/20 1327       Initial Review   Current issues with Current Depression;Current Anxiety/Panic;History of Depression;Current Psychotropic Meds;Current Stress Concerns    Source of Stress Concerns Chronic Illness;Unable to participate in former interests or hobbies;Unable to perform yard/household activities    Comments getting worked up for oxygen therapy, on meds for both depression and anxiety (feels well managed)      [place]? Yes   two daughters (lives with one of them, other near by), sister also comes to help as needed     Barriers   Psychosocial barriers to participate in program The patient should benefit from training in stress management and relaxation.;Psychosocial barriers identified (see note)      Screening Interventions   Interventions Encouraged to exercise;To provide support and resources with identified psychosocial needs;Provide feedback about the scores to participant    Expected Outcomes Short Term goal: Utilizing psychosocial counselor, staff and physician to  assist with identification of specific Stressors or current issues interfering with healing process. Setting desired goal for each stressor or current issue identified.;Long Term Goal: Stressors or current issues are controlled or eliminated.;Short Term goal: Identification and review with participant of any Quality  of Life or Depression concerns found by scoring the questionnaire.;Long Term goal: The participant improves quality of Life and PHQ9 Scores as seen by post scores and/or verbalization of changes             Quality of Life Scores:  Scores of 19 and below usually indicate a poorer quality of life in these areas.  A difference of  2-3 points is a clinically meaningful difference.  A difference of 2-3 points in the total score of the Quality of Life Index has been associated with significant improvement in overall quality of life, self-image, physical symptoms, and general health in studies assessing change in quality of life.  PHQ-9: Recent Review Flowsheet Data     Depression screen [facility] 2/9 01/16/2021 12/15/2020   Decreased Interest 2 2   Down, Depressed, Hopeless 0 0   PHQ - 2 Score 2 2   Altered sleeping 2 3   Tired, decreased energy 2 2   Change in appetite 2 3   Feeling bad or failure about yourself  0 0   Trouble concentrating 0 3   Moving slowly or fidgety/restless 0 2   Suicidal thoughts 0 0   PHQ-9 Score 8 15   Difficult doing work/chores Somewhat difficult Somewhat difficult      Interpretation of Total Score  Total Score Depression Severity:  1-4 = Minimal depression, 5-9 = Mild depression, 10-14 = Moderate depression, 15-19 = Moderately severe depression, 20-27 = Severe depression   Psychosocial Evaluation and Intervention:  Psychosocial Evaluation - 12/12/20 1342       Psychosocial Evaluation & Interventions   Interventions Encouraged to exercise with the program and follow exercise prescription;Stress management education    Comments Ms. Niccoli is  coming into pulmonary rehab for asthma.  She has oxygen prescribed but they are still in the process of getting her set up for home oxygen therapy.  She was encouraged to also request a pulse oximeter to monitor her saturations at home.  Currently, she does not have any portable equipment, thus we will see how she does on her walk test without oxygen.  She has also had a sleep study completed that shows that she needs a CPAP but it is currently on back order.  She is eager to start rehab and hopes to learn her limitations and be able to breathe better.  She lives at home with one of her two daughters who help her as needed.  She also has a sister that lives within a few hours drive that also stops by to help when needed.  She has a long history of both depression and anxiety and is on medication for both.  She is currently feeling like her mood is well managed and does not feel that she has any current symptoms.  We will continue to check in with her on these.    Expected Outcomes Short: Attend rehab to learn limits and breathing techniques to help her manage life better Long: Continued compliance on medication and mood boost with exercise too    Continue Psychosocial Services  Follow up required by staff             Psychosocial Re-Evaluation:   Psychosocial Discharge (Final Psychosocial Re-Evaluation):   Education: Education Goals: Education classes will be provided on a weekly basis, covering required topics. Participant will state understanding/return demonstration of topics presented.  Learning Barriers/Preferences:  Learning Barriers/Preferences - 12/12/20 1324       Learning Barriers/Preferences  Learning Barriers Sight   contacts and glasses, corneal transplant   Learning Preferences None             General Pulmonary Education Topics:  Infection Prevention: - Provides verbal and written material to individual with discussion of infection control including proper hand  washing and proper equipment cleaning during exercise session. Flowsheet Row Pulmonary Rehab from 01/18/2021 in [facility] and Pulmonary Rehab  Education need identified 12/15/20  Date 12/15/20  Educator [place]  Instruction Review Code 1- Verbalizes Understanding       Falls Prevention: - Provides verbal and written material to individual with discussion of falls prevention and safety. Flowsheet Row Pulmonary Rehab from 01/18/2021 in [facility] and Pulmonary Rehab  Education need identified 12/15/20  Date 12/15/20  Educator [place]  Instruction Review Code 1- Verbalizes Understanding       Chronic Lung Disease Review: - Group verbal instruction with posters, models, PowerPoint presentations and videos,  to review new updates, new respiratory medications, new advancements in procedures and treatments. Providing information on websites and "800" numbers for continued self-education. Includes information about supplement oxygen, available portable oxygen systems, continuous and intermittent flow rates, oxygen safety, concentrators, and Medicare reimbursement for oxygen. Explanation of Pulmonary Drugs, including class, frequency, complications, importance of spacers, rinsing mouth after steroid MDI's, and proper cleaning methods for nebulizers. Review of basic lung anatomy and physiology related to function, structure, and complications of lung disease. Review of risk factors. Discussion about methods for diagnosing sleep apnea and types of masks and machines for OSA. Includes a review of the use of types of environmental controls: home humidity, furnaces, filters, dust mite/pet prevention, HEPA vacuums. Discussion about weather changes, air quality and the benefits of nasal washing. Instruction on Warning signs, infection symptoms, calling MD promptly, preventive modes, and value of vaccinations. Review of effective airway clearance, coughing and/or vibration techniques. Emphasizing that all should  Create an Action Plan. Written material given at graduation. Flowsheet Row Pulmonary Rehab from 01/18/2021 in [facility] Cardiac and Pulmonary Rehab  Education need identified 12/15/20       AED/CPR: - Group verbal and written instruction with the use of models to demonstrate the basic use of the AED with the basic ABC's of resuscitation.    Anatomy and Cardiac Procedures: - Group verbal and visual presentation and models provide information about basic cardiac anatomy and function. Reviews the testing methods done to diagnose heart disease and the outcomes of the test results. Describes the treatment choices: Medical Management, Angioplasty, or Coronary Bypass Surgery for treating various heart conditions including Myocardial Infarction, Angina, Valve Disease, and Cardiac Arrhythmias.  Written material given at graduation. Flowsheet Row Pulmonary Rehab from 01/18/2021 in [facility] Cardiac and Pulmonary Rehab  Date 12/28/20  Educator [place]  Instruction Review Code 1- Verbalizes Understanding       Medication Safety: - Group verbal and visual instruction to review commonly prescribed medications for heart and lung disease. Reviews the medication, class of the drug, and side effects. Includes the steps to properly store meds and maintain the prescription regimen.  Written material given at graduation. Flowsheet Row Pulmonary Rehab from 01/18/2021 in [facility] Cardiac and Pulmonary Rehab  Date 01/18/21  Educator [place]  Instruction Review Code 1- Verbalizes Understanding       Other: -Provides group and verbal instruction on various topics (see comments)   Knowledge Questionnaire Score:  Knowledge Questionnaire Score - 12/15/20 1429       Knowledge Questionnaire Score   Pre Score 16/18:  Lung disease, Oxygen              Core Components/Risk Factors/Patient Goals at Admission:  Personal Goals and Risk Factors at Admission - 12/15/20 1436       Core Components/Risk Factors/Patient Goals on  Admission    Weight Management Yes;Weight Loss;Obesity    Intervention Weight Management: Develop a combined nutrition and exercise program designed to reach desired caloric intake, while maintaining appropriate intake of nutrient and fiber, sodium and fats, and appropriate energy expenditure required for the weight goal.;Weight Management: Provide education and appropriate resources to help participant work on and attain dietary goals.    Admit Weight 208 lb (94.3 kg)    Goal Weight: Short Term 203 lb (92.1 kg)    Goal Weight: Long Term 190 lb (86.2 kg)    Expected Outcomes Short Term: Continue to assess and modify interventions until short term weight is achieved;Long Term: Adherence to nutrition and physical activity/exercise program aimed toward attainment of established weight goal;Weight Loss: Understanding of general recommendations for a balanced deficit meal plan, which promotes 1-2 lb weight loss per week and includes a negative energy balance of [id number] kcal/d;Understanding recommendations for meals to include 15-35% energy as protein, 25-35% energy from fat, 35-60% energy from carbohydrates, less than 25m of dietary cholesterol, 20-35 gm of total fiber daily;Understanding of distribution of calorie intake throughout the day with the consumption of 4-5 meals/snacks    Improve shortness of breath with ADL's Yes    Intervention Provide education, individualized exercise plan and daily activity instruction to help decrease symptoms of SOB with activities of daily living.    Expected Outcomes Short Term: Improve cardiorespiratory fitness to achieve a reduction of symptoms when performing ADLs;Long Term: Be able to perform more ADLs without symptoms or delay the onset of symptoms    Diabetes Yes    Intervention Provide education about signs/symptoms and action to take for hypo/hyperglycemia.;Provide education about proper nutrition, including hydration, and aerobic/resistive exercise prescription  along with prescribed medications to achieve blood glucose in normal ranges: Fasting glucose 65-99 mg/dL    Expected Outcomes Short Term: Participant verbalizes understanding of the signs/symptoms and immediate care of hyper/hypoglycemia, proper foot care and importance of medication, aerobic/resistive exercise and nutrition plan for blood glucose control.;Long Term: Attainment of HbA1C < 7%.    Hypertension Yes    Intervention Provide education on lifestyle modifcations including regular physical activity/exercise, weight management, moderate sodium restriction and increased consumption of fresh fruit, vegetables, and low fat dairy, alcohol moderation, and smoking cessation.;Monitor prescription use compliance.    Expected Outcomes Short Term: Continued assessment and intervention until BP is < 140/966mHG in hypertensive participants. < 130/8020mG in hypertensive participants with diabetes, heart failure or chronic kidney disease.;Long Term: Maintenance of blood pressure at goal levels.    Lipids Yes    Intervention Provide education and support for participant on nutrition & aerobic/resistive exercise along with prescribed medications to achieve LDL <98m32mDL >40mg98m Expected Outcomes Short Term: Participant states understanding of desired cholesterol values and is compliant with medications prescribed. Participant is following exercise prescription and nutrition guidelines.;Long Term: Cholesterol controlled with medications as prescribed, with individualized exercise RX and with personalized nutrition plan. Value goals: LDL < 98mg,25m > 40 mg.             Education:Diabetes - Individual verbal and written instruction to review signs/symptoms of diabetes, desired ranges of glucose level fasting, after meals and with exercise. Acknowledge that  pre and post exercise glucose checks will be done for 3 sessions at entry of program. Flowsheet Row Pulmonary Rehab from 12/12/2020 in [facility] Cardiac and  Pulmonary Rehab  Date 12/12/20  Educator [facility]  Instruction Review Code 1- Verbalizes Understanding       Know Your Numbers and Heart Failure: - Group verbal and visual instruction to discuss disease risk factors for cardiac and pulmonary disease and treatment options.  Reviews associated critical values for Overweight/Obesity, Hypertension, Cholesterol, and Diabetes.  Discusses basics of heart failure: signs/symptoms and treatments.  Introduces Heart Failure Zone chart for action plan for heart failure.  Written material given at graduation.   Core Components/Risk Factors/Patient Goals Review:   Goals and Risk Factor Review     Row Name 01/18/21 1014             Core Components/Risk Factors/Patient Goals Review   Personal Goals Review Improve shortness of breath with ADL's;Weight Management/Obesity;Diabetes       Review Ethal has not been checking or monitoring anything at home. She has been sick for the last 2 weeks which hindered a lot for her. Her appetite is finally improving and she re-started her insulin (not sure why she stopped it before). We talked importance staying compliant with use and to contact doctor if she ever decides to stop or change the way she takes her medications. She does not take her blood sugars at home and discussed how imperative it is to start those again. She does not check weights currently and encouraged again to make sure she does not have any abnormal weight gain or weight loss. Discussed keeping a log of weights, sugar, BP, etc.. She has not thought about any weight loss goals. Her breathing has not gotten worse since starting the program but has not seen any significant improvement with SOB.       Expected Outcomes Short: Monitor weight and blood sugars at home continuously. Long: Manage lifestyle risk factors                Core Components/Risk Factors/Patient Goals at Discharge (Final Review):   Goals and Risk Factor Review - 01/18/21 1014        Core Components/Risk Factors/Patient Goals Review   Personal Goals Review Improve shortness of breath with ADL's;Weight Management/Obesity;Diabetes    Review [place] has not been checking or monitoring anything at home. She has been sick for the last 2 weeks which hindered a lot for her. Her appetite is finally improving and she re-started her insulin (not sure why she stopped it before). We talked importance staying compliant with use and to contact doctor if she ever decides to stop or change the way she takes her medications. She does not take her blood sugars at home and discussed how imperative it is to start those again. She does not check weights currently and encouraged again to make sure she does not have any abnormal weight gain or weight loss. Discussed keeping a log of weights, sugar, BP, etc.. She has not thought about any weight loss goals. Her breathing has not gotten worse since starting the program but has not seen any significant improvement with SOB.    Expected Outcomes Short: Monitor weight and blood sugars at home continuously. Long: Manage lifestyle risk factors             ITP Comments:  ITP Comments     Row Name 12/12/20 1341 12/15/20 1204 12/21/20 1001 12/28/20 0629 01/02/21 1042   ITP Comments  Completed virtual orientation today.  EP evaluation is scheduled for Thursday 10/20 at 8am.  Documentation for diagnosis can be found in CE encounter 11/24/20. Completed 6MWT and gym orientation. Initial ITP created and sent for review to Dr. Ottie Glazier, Medical Director. First full day of exercise!  Patient was oriented to gym and equipment including functions, settings, policies, and procedures.  Patient's individual exercise prescription and treatment plan were reviewed.  All starting workloads were established based on the results of the 6 minute walk test done at initial orientation visit.  The plan for exercise progression was also introduced and progression will be  customized based on patient's performance and goals. 30 Day review completed. Medical Director ITP review done, changes made as directed, and signed approval by Medical Director.    New to program Completed initial consultation, scheduled follow-up 11/21 at 915am.    [place] Name 01/16/21 1123 01/25/21 0623         ITP Comments Completed RD follow up 30 Day review completed. Medical Director ITP review done, changes made as directed, and signed approval by Medical Director.               Comments:

## 2021-02-06 ENCOUNTER — Encounter: Payer: Medicare HMO | Attending: Pulmonary Disease

## 2021-02-06 DIAGNOSIS — J45909 Unspecified asthma, uncomplicated: Secondary | ICD-10-CM | POA: Insufficient documentation

## 2021-02-06 NOTE — Progress Notes (Signed)
02/07/2021 2:20 PM   Madeline Martinez 1946-01-26 [id number]  Referring provider: [name], [place] [place],  [place] [address]  Chief Complaint  Patient presents with   Nocturia    Urological history: 1. OAB -contributing factors of age, diabetes, anxiety and depression  -PVR 49 mL -managed with Myrbetriq 50 mg daily and oxybutynin XL 10 mg daily   2. Bilateral renal cysts -contrast CT 03/2020 - 6.6 cm hypodense, fluid attenuating right renal mass consistent with a cyst. 13 mm hypodense fluid attenuating left interpolar renal mass consistent with a cyst. 14 mm hypodense, fluid attenuating left interpolar renal mass consistent with a cyst  HPI: Madeline Martinez is a 75 y.o. female who presents today for one year follow up with her daughter, [name].  She is experiencing 1-7 daytime voids, nocturia x1 with mild urge.  She does have some episodes of stress incontinence.  She leaks 1-2 times daily she goes through 2 absorbent underwear's daily.  She does engage in fluid restriction and toilet mapping.  PVR 49 mL  She does not feel that the Myrbetriq and oxybutynin are working as she is not completely dry.  Patient denies any modifying or aggravating factors.  Patient denies any gross hematuria, dysuria or suprapubic/flank pain.  Patient denies any fevers, chills, nausea or vomiting.     PMH: Past Medical History:  Diagnosis Date   Acid reflux    Anxiety    Depression    Diabetes mellitus    Dysrhythmia    Gout    Heart murmur    HTN (hypertension)    Hyperlipidemia    Sleep apnea     Surgical History: Past Surgical History:  Procedure Laterality Date   ABDOMINAL HYSTERECTOMY     BACK SURGERY  2007   DISCS   BIOPSY  10/08/2018   Procedure: BIOPSY;  Surgeon: Irving Copas., MD;  Location: [facility];  Service: Gastroenterology;;   COLONOSCOPY WITH PROPOFOL N/A 12/19/2017   Procedure: COLONOSCOPY WITH PROPOFOL;  Surgeon: Jonathon Bellows, MD;   Location: [facility];  Service: Gastroenterology;  Laterality: N/A;   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   ENDOSCOPIC MUCOSAL RESECTION N/A 10/08/2018   Procedure: ENDOSCOPIC MUCOSAL RESECTION;  Surgeon: Rush Landmark Telford Nab., MD;  Location: [facility];  Service: Gastroenterology;  Laterality: N/A;   ESOPHAGOGASTRODUODENOSCOPY (EGD) WITH PROPOFOL N/A 08/28/2018   Procedure: ESOPHAGOGASTRODUODENOSCOPY (EGD) WITH PROPOFOL;  Surgeon: Jonathon Bellows, MD;  Location: [facility];  Service: Gastroenterology;  Laterality: N/A;   ESOPHAGOGASTRODUODENOSCOPY (EGD) WITH PROPOFOL N/A 10/08/2018   Procedure: ESOPHAGOGASTRODUODENOSCOPY (EGD) WITH PROPOFOL;  Surgeon: Rush Landmark Telford Nab., MD;  Location: [facility];  Service: Gastroenterology;  Laterality: N/A;   EUS N/A 10/08/2018   Procedure: UPPER ENDOSCOPIC ULTRASOUND (EUS) RADIAL;  Surgeon: Irving Copas., MD;  Location: [facility];  Service: Gastroenterology;  Laterality: N/A;   PARTIAL HYSTERECTOMY  1986   POLYPECTOMY  10/08/2018   Procedure: POLYPECTOMY;  Surgeon: Mansouraty, Telford Nab., MD;  Location: [facility];  Service: Gastroenterology;;    Home Medications:  Allergies as of 02/07/2021   No Known Allergies      Medication List        Accurate as of February 07, 2021  2:20 PM. If you have any questions, ask your nurse or doctor.          STOP taking these medications    mirabegron ER 50 MG Tb24 tablet Commonly known as: MYRBETRIQ Stopped by: Zara Council, PA-C  TAKE these medications    allopurinol 100 MG tablet Commonly known as: ZYLOPRIM Take 100 mg by mouth daily with lunch.   aspirin 81 MG EC tablet Take by mouth.   atorvastatin 80 MG tablet Commonly known as: LIPITOR Take 80 mg by mouth daily.   Biotin 5000 MCG Tabs Take 5,000 mcg by mouth daily. HAIR/SKIN/NAILS   carvedilol 12.5 MG tablet Commonly known as: COREG Take 12.5 mg by mouth 2 (two) times daily.    cetirizine 10 MG tablet Commonly known as: ZYRTEC Take 10 mg by mouth daily.   Dexilant 60 MG capsule Generic drug: dexlansoprazole Take 1 capsule by mouth daily.   ferrous sulfate 325 (65 FE) MG tablet Take 325 mg by mouth daily.   FLUoxetine 40 MG capsule Commonly known as: PROZAC Take 40 mg by mouth daily.   gabapentin 400 MG capsule Commonly known as: NEURONTIN Take 400 mg by mouth daily.   Gemtesa 75 MG Tabs Generic drug: Vibegron Take 75 mg by mouth daily. Started by: Zara Council, PA-C   glipiZIDE 5 MG 24 hr tablet Commonly known as: GLUCOTROL XL Take 5 mg by mouth 3 (three) times daily.   hydrochlorothiazide 25 MG tablet Commonly known as: HYDRODIURIL Take 25 mg by mouth daily.   HYDROcodone-acetaminophen 5-325 MG tablet Commonly known as: NORCO/VICODIN Take 1 tablet by mouth daily as needed (pain.).   levothyroxine 25 MCG tablet Commonly known as: SYNTHROID Take 25 mcg by mouth daily.   lidocaine 5 % Commonly known as: Lidoderm Place 1 patch onto the skin daily. Remove & Discard patch within 12 hours or as directed by MD   Linzess 290 MCG Caps capsule Generic drug: linaclotide TAKE 1 CAPSULE BY MOUTH DAILY BEFORE BREAKFAST.   LORazepam 1 MG tablet Commonly known as: ATIVAN Take 1 mg by mouth 4 (four) times daily.   losartan 100 MG tablet Commonly known as: COZAAR Take 100 mg by mouth daily.   meloxicam 15 MG tablet Commonly known as: MOBIC Take 15 mg by mouth daily.   metFORMIN 1000 MG tablet Commonly known as: GLUCOPHAGE Take 1,000 mg by mouth 2 (two) times daily.   methocarbamol 750 MG tablet Commonly known as: ROBAXIN Take 750 mg by mouth daily with lunch.   mirtazapine 15 MG tablet Commonly known as: REMERON Take 15 mg by mouth at bedtime.   omeprazole 20 MG capsule Commonly known as: PRILOSEC TAKE 2 CAPSULES (40 MG TOTAL) BY MOUTH 2 (TWO) TIMES DAILY BEFORE A MEAL.   oxybutynin 10 MG 24 hr tablet Commonly known as:  DITROPAN-XL TAKE 1 TABLET BY MOUTH EVERY DAY   oxyCODONE-acetaminophen 5-325 MG tablet Commonly known as: PERCOCET/ROXICET Take 1 tablet by mouth every 4 (four) hours as needed.   pregabalin 75 MG capsule Commonly known as: LYRICA Take 75 mg by mouth 3 (three) times daily.   PROBIOTIC PO Take 1 tablet by mouth daily.   Toujeo SoloStar 300 UNIT/ML Solostar Pen Generic drug: insulin glargine (1 Unit Dial) Inject 20 Units into the skin daily.   Vitamin D-3 125 MCG (5000 UT) Tabs Take 5,000 Units by mouth daily.   vitamin E 45 MG (100 UNITS) capsule Take by mouth.        Allergies: No Known Allergies  Family History: Family History  Problem Relation Age of Onset   Breast cancer Mother    Heart failure Father    Other Sister        DEGEN.Gallia DISEASE   Breast cancer Maternal Grandmother  Kidney cancer Neg Hx    Kidney disease Neg Hx    Prostate cancer Neg Hx     Social History:  reports that she has never smoked. She has never used smokeless tobacco. She reports that she does not drink alcohol and does not use drugs.  ROS: For pertinent review of systems please refer to history of present illness  Physical Exam: BP 114/64   Pulse 86   Ht 5' 0.5" (1.537 m)   Wt 205 lb (93 kg)   BMI 39.38 kg/m   Constitutional:  Well nourished. Alert and oriented, No acute distress. HEENT: [place] AT, mask in place.  Trachea midline Cardiovascular: No clubbing, cyanosis, or edema. Respiratory: Normal respiratory effort, no increased work of breathing. Neurologic: Grossly intact, no focal deficits, moving all 4 extremities. Psychiatric: Normal mood and affect.    Laboratory Data: Lab Results  Component Value Date   WBC 8.7 04/01/2020   HGB 13.3 04/01/2020   HCT 39.0 04/01/2020   MCV 96.1 04/01/2020   PLT 368 04/01/2020    Lab Results  Component Value Date   CREATININE 1.10 (H) 04/01/2020    Lab Results  Component Value Date   AST 34 04/01/2020   Lab Results   Component Value Date   ALT 32 04/01/2020   Glucose 70 - 110 mg/dL 112 High    Sodium 136 - 145 mmol/L 137   Potassium 3.6 - 5.1 mmol/L 4.9   Chloride 97 - 109 mmol/L 102   Carbon Dioxide (CO2) 22.0 - 32.0 mmol/L 27.4   Urea Nitrogen (BUN) 7 - 25 mg/dL 34 High    Creatinine 0.6 - 1.1 mg/dL 1.1   Glomerular Filtration Rate (eGFR), MDRD Estimate >60 mL/min/1.73sq m 59 Low    Calcium 8.7 - 10.3 mg/dL 9.1   AST  8 - 39 U/L 21   ALT  5 - 38 U/L 23   Alk Phos (alkaline Phosphatase) 34 - 104 U/L 64   Albumin 3.5 - 4.8 g/dL 4.3   Bilirubin, Total 0.3 - 1.2 mg/dL 0.3   Protein, Total 6.1 - 7.9 g/dL 7.3   A/G Ratio 1.0 - 5.0 gm/dL 1.4   Resulting Agency  [facility]  Specimen Collected: 09/01/20 14:26 Last Resulted: 09/01/20 17:05  Received From: [facility]  Result Received: 10/02/20 15:03   WBC (White Blood Cell Count) 4.1 - 10.2 10^3/uL 7.3   RBC (Red Blood Cell Count) 4.04 - 5.48 10^6/uL 3.94 Low    Hemoglobin 12.0 - 15.0 gm/dL 11.9 Low    Hematocrit 35.0 - 47.0 % 38.0   MCV (Mean Corpuscular Volume) 80.0 - 100.0 fl 96.4   MCH (Mean Corpuscular Hemoglobin) 27.0 - 31.2 pg 30.2   MCHC (Mean Corpuscular Hemoglobin Concentration) 32.0 - 36.0 gm/dL 31.3 Low    Platelet Count 150 - 450 10^3/uL 377   RDW-CV (Red Cell Distribution Width) 11.6 - 14.8 % 15.7 High    MPV (Mean Platelet Volume) 9.4 - 12.4 fl 10.3   Neutrophils 1.50 - 7.80 10^3/uL 3.58   Lymphocytes 1.00 - 3.60 10^3/uL 2.43   Monocytes 0.00 - 1.50 10^3/uL 0.65   Eosinophils 0.00 - 0.55 10^3/uL 0.54   Basophils 0.00 - 0.09 10^3/uL 0.06   Neutrophil % 32.0 - 70.0 % 49.2   Lymphocyte % 10.0 - 50.0 % 33.4   Monocyte % 4.0 - 13.0 % 8.9   Eosinophil % 1.0 - 5.0 % 7.4 High    Basophil% 0.0 -  2.0 % 0.8   Immature Granulocyte % <=0.7 % 0.3   Immature Granulocyte Count <=0.06 10^3/L 0.02   Resulting Agency  [facility]  Specimen Collected: 09/01/20 14:26 Last Resulted: 09/01/20 15:31   Received From: [facility]  Result Received: 10/02/20 15:03  I have reviewed the labs.  Pertinent Imaging:  02/07/21 13:55  Scan Result 49    Assessment & Plan:    1. Nighttime Incontinence -Discuss expectations with patient and stated that we may never get her completely dry -As she is not certain the medication is working, I recommended a medication holiday to see if her urinary symptoms worsen while off the medication but her daughter deferred -We will have a trial of Gemtesa 75 mg daily in place of the Myrbetriq 50 mg daily to see if we can get her closer to goal  2. Nocturia -She likely has issues with sleep apnea as she states her sisters make comments that she coughs while she sleeping -Encourage CPAP use  3. Urgency See above   Return in about 1 month (around 03/10/2021) for PVR and OAB questionnaire.  These notes generated with voice recognition software. I apologize for typographical errors.  Zara Council, PA-C  [facility] [address] [place], [place] [address] [phone]

## 2021-02-07 ENCOUNTER — Encounter: Payer: Self-pay | Admitting: Urology

## 2021-02-07 ENCOUNTER — Ambulatory Visit (INDEPENDENT_AMBULATORY_CARE_PROVIDER_SITE_OTHER): Payer: Medicare HMO | Admitting: Urology

## 2021-02-07 ENCOUNTER — Other Ambulatory Visit: Payer: Self-pay

## 2021-02-07 VITALS — BP 114/64 | HR 86 | Ht 60.5 in | Wt 205.0 lb

## 2021-02-07 DIAGNOSIS — R3915 Urgency of urination: Secondary | ICD-10-CM

## 2021-02-07 DIAGNOSIS — N3944 Nocturnal enuresis: Secondary | ICD-10-CM | POA: Diagnosis not present

## 2021-02-07 DIAGNOSIS — R351 Nocturia: Secondary | ICD-10-CM | POA: Diagnosis not present

## 2021-02-07 LAB — BLADDER SCAN AMB NON-IMAGING: Scan Result: 49

## 2021-02-07 MED ORDER — GEMTESA 75 MG PO TABS: 75.0000 mg | ORAL_TABLET | Freq: Every day | ORAL | 0 refills | Status: DC

## 2021-02-09 ENCOUNTER — Ambulatory Visit: Payer: Self-pay | Admitting: Urology

## 2021-02-22 ENCOUNTER — Telehealth: Payer: Self-pay

## 2021-02-22 ENCOUNTER — Encounter: Payer: Self-pay | Admitting: *Deleted

## 2021-02-22 DIAGNOSIS — J45909 Unspecified asthma, uncomplicated: Secondary | ICD-10-CM

## 2021-02-22 NOTE — Telephone Encounter (Signed)
LMOM

## 2021-02-22 NOTE — Progress Notes (Signed)
Pulmonary Individual Treatment Plan  Patient Details  Name: Madeline Martinez MRN: [id number] Date of Birth: 07-Mar-1945 Referring Provider:   Flowsheet Row Pulmonary Rehab from 12/15/2020 in [facility]  Referring Provider Ottie Glazier MD       Initial Encounter Date:  Flowsheet Row Pulmonary Rehab from 12/15/2020 in [facility] Cardiac and Pulmonary Rehab  Date 12/15/20       Visit Diagnosis: Asthma, unspecified asthma severity, unspecified whether complicated, unspecified whether persistent  Patient's Home Medications on Admission:  Current Outpatient Medications:    allopurinol (ZYLOPRIM) 100 MG tablet, Take 100 mg by mouth daily with lunch. , Disp: , Rfl:    aspirin 81 MG EC tablet, Take by mouth., Disp: , Rfl:    atorvastatin (LIPITOR) 80 MG tablet, Take 80 mg by mouth daily., Disp: , Rfl:    Biotin 5000 MCG TABS, Take 5,000 mcg by mouth daily. HAIR/SKIN/NAILS, Disp: , Rfl:    carvedilol (COREG) 12.5 MG tablet, Take 12.5 mg by mouth 2 (two) times daily., Disp: , Rfl:    cetirizine (ZYRTEC) 10 MG tablet, Take 10 mg by mouth daily., Disp: , Rfl:    Cholecalciferol (VITAMIN D-3) 125 MCG (5000 UT) TABS, Take 5,000 Units by mouth daily., Disp: , Rfl:    DEXILANT 60 MG capsule, Take 1 capsule by mouth daily., Disp: , Rfl:    ferrous sulfate 325 (65 FE) MG tablet, Take 325 mg by mouth daily., Disp: , Rfl:    FLUoxetine (PROZAC) 40 MG capsule, Take 40 mg by mouth daily., Disp: , Rfl:    gabapentin (NEURONTIN) 400 MG capsule, Take 400 mg by mouth daily., Disp: , Rfl:    glipiZIDE (GLUCOTROL XL) 5 MG 24 hr tablet, Take 5 mg by mouth 3 (three) times daily., Disp: , Rfl:    hydrochlorothiazide (HYDRODIURIL) 25 MG tablet, Take 25 mg by mouth daily., Disp: , Rfl:    HYDROcodone-acetaminophen (NORCO/VICODIN) 5-325 MG tablet, Take 1 tablet by mouth daily as needed (pain.). , Disp: , Rfl:    levothyroxine (SYNTHROID) 25 MCG tablet, Take 25 mcg by mouth daily., Disp: , Rfl:     lidocaine (LIDODERM) 5 %, Place 1 patch onto the skin daily. Remove & Discard patch within 12 hours or as directed by MD, Disp: 15 patch, Rfl: 0   LINZESS 290 MCG CAPS capsule, TAKE 1 CAPSULE BY MOUTH DAILY BEFORE BREAKFAST., Disp: 90 capsule, Rfl: 1   LORazepam (ATIVAN) 1 MG tablet, Take 1 mg by mouth 4 (four) times daily. , Disp: , Rfl:    losartan (COZAAR) 100 MG tablet, Take 100 mg by mouth daily., Disp: , Rfl:    meloxicam (MOBIC) 15 MG tablet, Take 15 mg by mouth daily., Disp: , Rfl:    metFORMIN (GLUCOPHAGE) 1000 MG tablet, Take 1,000 mg by mouth 2 (two) times daily., Disp: , Rfl:    methocarbamol (ROBAXIN) 750 MG tablet, Take 750 mg by mouth daily with lunch., Disp: , Rfl:    mirtazapine (REMERON) 15 MG tablet, Take 15 mg by mouth at bedtime. , Disp: , Rfl:    omeprazole (PRILOSEC) 20 MG capsule, TAKE 2 CAPSULES (40 MG TOTAL) BY MOUTH 2 (TWO) TIMES DAILY BEFORE A MEAL., Disp: 180 capsule, Rfl: 1   oxybutynin (DITROPAN-XL) 10 MG 24 hr tablet, TAKE 1 TABLET BY MOUTH EVERY DAY, Disp: 90 tablet, Rfl: 3   oxyCODONE-acetaminophen (PERCOCET/ROXICET) 5-325 MG tablet, Take 1 tablet by mouth every 4 (four) hours as needed., Disp: 15 tablet, Rfl: 0  pregabalin (LYRICA) 75 MG capsule, Take 75 mg by mouth 3 (three) times daily., Disp: , Rfl:    Probiotic Product (PROBIOTIC PO), Take 1 tablet by mouth daily., Disp: , Rfl:    TOUJEO SOLOSTAR 300 UNIT/ML SOPN, Inject 20 Units into the skin daily. , Disp: , Rfl:    Vibegron (GEMTESA) 75 MG TABS, Take 75 mg by mouth daily., Disp: 28 tablet, Rfl: 0   vitamin E 45 MG (100 UNITS) capsule, Take by mouth., Disp: , Rfl:   Past Medical History: Past Medical History:  Diagnosis Date   Acid reflux    Anxiety    Depression    Diabetes mellitus    Dysrhythmia    Gout    Heart murmur    HTN (hypertension)    Hyperlipidemia    Sleep apnea     Tobacco Use: Social History   Tobacco Use  Smoking Status Never  Smokeless Tobacco Never    Labs: Recent  Review Flowsheet Data     Labs for ITP Cardiac and Pulmonary Rehab Latest Ref Rng & Units 02/14/2010 04/01/2020   Cholestrol 0 - 200 mg/dL 217(A) -   LDLCALC mg/dL 135 -   HDL 35 - 70 mg/dL 61 -   Trlycerides 40 - 160 mg/dL 103 -   TCO2 22 - 32 mmol/L - 27        Pulmonary Assessment Scores:  Pulmonary Assessment Scores     Row Name 12/15/20 1427         ADL UCSD   ADL Phase Entry     SOB Score total 59     Rest 0     Walk 5     Stairs 5     Bath 0     Dress 5     Shop 4       CAT Score   CAT Score 37       mMRC Score   mMRC Score 3              UCSD: Self-administered rating of dyspnea associated with activities of daily living (ADLs) 6-point scale (0 = "not at all" to 5 = "maximal or unable to do because of breathlessness")  Scoring Scores range from 0 to 120.  Minimally important difference is 5 units  CAT: CAT can identify the health impairment of COPD patients and is better correlated with disease progression.  CAT has a scoring range of zero to 40. The CAT score is classified into four groups of low (less than 10), medium (10 - 20), high (21-30) and very high (31-40) based on the impact level of disease on health status. A CAT score over 10 suggests significant symptoms.  A worsening CAT score could be explained by an exacerbation, poor medication adherence, poor inhaler technique, or progression of COPD or comorbid conditions.  CAT MCID is 2 points  mMRC: mMRC (Modified Medical Research Council) Dyspnea Scale is used to assess the degree of baseline functional disability in patients of respiratory disease due to dyspnea. No minimal important difference is established. A decrease in score of 1 point or greater is considered a positive change.   Pulmonary Function Assessment:   Exercise Target Goals: Exercise Program Goal: Individual exercise prescription set using results from initial 6 min walk test and THRR while considering  patients activity  barriers and safety.   Exercise Prescription Goal: Initial exercise prescription builds to 30-45 minutes a day of aerobic activity, 2-3 days per week.  Home  exercise guidelines will be given to patient during program as part of exercise prescription that the participant will acknowledge.  Education: Aerobic Exercise: - Group verbal and visual presentation on the components of exercise prescription. Introduces F.I.T.T principle from ACSM for exercise prescriptions.  Reviews F.I.T.T. principles of aerobic exercise including progression. Written material given at graduation.   Education: Resistance Exercise: - Group verbal and visual presentation on the components of exercise prescription. Introduces F.I.T.T principle from ACSM for exercise prescriptions  Reviews F.I.T.T. principles of resistance exercise including progression. Written material given at graduation. Flowsheet Row Pulmonary Rehab from 01/18/2021 in [facility] Cardiac and Pulmonary Rehab  Date 12/28/20  Educator [facility]  Instruction Review Code 1- Verbalizes Understanding        Education: Exercise & Equipment Safety: - Individual verbal instruction and demonstration of equipment use and safety with use of the equipment. Flowsheet Row Pulmonary Rehab from 01/18/2021 in [facility] Cardiac and Pulmonary Rehab  Education need identified 12/15/20  Date 12/15/20  Educator [place]  Instruction Review Code 1- Verbalizes Understanding       Education: Exercise Physiology & General Exercise Guidelines: - Group verbal and written instruction with models to review the exercise physiology of the cardiovascular system and associated critical values. Provides general exercise guidelines with specific guidelines to those with heart or lung disease.    Education: Flexibility, Balance, Mind/Body Relaxation: - Group verbal and visual presentation with interactive activity on the components of exercise prescription. Introduces F.I.T.T principle from ACSM for  exercise prescriptions. Reviews F.I.T.T. principles of flexibility and balance exercise training including progression. Also discusses the mind body connection.  Reviews various relaxation techniques to help reduce and manage stress (i.e. Deep breathing, progressive muscle relaxation, and visualization). Balance handout provided to take home. Written material given at graduation. Flowsheet Row Pulmonary Rehab from 01/18/2021 in [facility] Cardiac and Pulmonary Rehab  Date 01/04/21  Educator [facility]  Instruction Review Code 1- Verbalizes Understanding       Activity Barriers & Risk Stratification:  Activity Barriers & Cardiac Risk Stratification - 12/15/20 1425       Activity Barriers & Cardiac Risk Stratification   Activity Barriers Back Problems;Other (comment);Shortness of Breath;Muscular Weakness;Deconditioning;Joint Problems;Balance Concerns;History of Falls    Comments chronic back pain, hard for her to walk distance, occasional knee pain             6 Minute Walk:  6 Minute Walk     Row Name 12/15/20 1431         6 Minute Walk   Phase Initial     Distance 90 feet     Walk Time 4 minutes  Break 1:54-4:10     # of Rest Breaks 1     MPH 0.17     METS 0.1     RPE 15     Perceived Dyspnea  3     VO2 Peak 0.35     Symptoms Yes (comment)     Comments SOB, Right knee pain 5/10     Resting HR 89 bpm     Resting BP 140/70     Resting Oxygen Saturation  94 %     Exercise Oxygen Saturation  during 6 min walk 93 %     Max Ex. HR 102 bpm     Max Ex. BP 162/72     2 Minute Post BP 144/74       Interval HR   1 Minute HR 102     2 Minute HR 98  3 Minute HR 99     4 Minute HR 97     5 Minute HR 102     6 Minute HR 88     2 Minute Post HR 84     Interval Heart Rate? Yes       Interval Oxygen   Interval Oxygen? Yes     Baseline Oxygen Saturation % 94 %     1 Minute Oxygen Saturation % 93 %     1 Minute Liters of Oxygen 0 L  RA     2 Minute Oxygen Saturation % 95 %     2  Minute Liters of Oxygen 0 L     3 Minute Oxygen Saturation % 94 %     3 Minute Liters of Oxygen 0 L     4 Minute Oxygen Saturation % 97 %     4 Minute Liters of Oxygen 0 L     5 Minute Oxygen Saturation % 96 %     5 Minute Liters of Oxygen 0 L     6 Minute Oxygen Saturation % 95 %     6 Minute Liters of Oxygen 0 L     2 Minute Post Oxygen Saturation % 96 %     2 Minute Post Liters of Oxygen 0 L             Oxygen Initial Assessment:  Oxygen Initial Assessment - 01/18/21 1048       Home Oxygen   Home Oxygen Device None    Sleep Oxygen Prescription None    Home Exercise Oxygen Prescription None    Home Resting Oxygen Prescription None      Initial 6 min Walk   Oxygen Used None      Program Oxygen Prescription   Program Oxygen Prescription None;Continuous    Liters per minute 2   prn     Intervention   Short Term Goals To learn and exhibit compliance with exercise, home and travel O2 prescription;To learn and understand importance of monitoring SPO2 with pulse oximeter and demonstrate accurate use of the pulse oximeter.;To learn and understand importance of maintaining oxygen saturations>88%;To learn and demonstrate proper pursed lip breathing techniques or other breathing techniques.     Long  Term Goals Exhibits compliance with exercise, home  and travel O2 prescription;Verbalizes importance of monitoring SPO2 with pulse oximeter and return demonstration;Maintenance of O2 saturations>88%;Exhibits proper breathing techniques, such as pursed lip breathing or other method taught during program session;Compliance with respiratory medication             Oxygen Re-Evaluation:  Oxygen Re-Evaluation     [place] Name 12/21/20 1002 01/18/21 1048           Program Oxygen Prescription   Program Oxygen Prescription Continuous --      Liters per minute 2 --        Home Oxygen   Home Oxygen Device None --      Sleep Oxygen Prescription None --      Home Exercise Oxygen  Prescription None --      Home Resting Oxygen Prescription None --        Goals/Expected Outcomes   Short Term Goals To learn and exhibit compliance with exercise, home and travel O2 prescription;To learn and understand importance of monitoring SPO2 with pulse oximeter and demonstrate accurate use of the pulse oximeter.;To learn and understand importance of maintaining oxygen saturations>88%;To learn and demonstrate proper pursed lip breathing techniques or  other breathing techniques.  --      Long  Term Goals Exhibits compliance with exercise, home  and travel O2 prescription;Verbalizes importance of monitoring SPO2 with pulse oximeter and return demonstration;Maintenance of O2 saturations>88%;Exhibits proper breathing techniques, such as pursed lip breathing or other method taught during program session;Compliance with respiratory medication --      Comments Reviewed PLB technique with pt.  Talked about how it works and it's importance in maintaining their exercise saturations. Encouraged patient to use pulse ox at home to check that oxygen is staying above 88%. She uses PLB when needed and trying to move more at home.      Goals/Expected Outcomes Short: Become more profiecient at using PLB.   Long: Become independent at using PLB. Short: Check O2 at home continuously   Long: Become independent at using PLB.               Oxygen Discharge (Final Oxygen Re-Evaluation):  Oxygen Re-Evaluation - 01/18/21 1048       Goals/Expected Outcomes   Comments Encouraged patient to use pulse ox at home to check that oxygen is staying above 88%. She uses PLB when needed and trying to move more at home.    Goals/Expected Outcomes Short: Check O2 at home continuously   Long: Become independent at using PLB.             Initial Exercise Prescription:  Initial Exercise Prescription - 12/15/20 1400       Date of Initial Exercise RX and Referring Provider   Date 12/15/20    Referring Provider  Ottie Glazier MD      Oxygen   Oxygen Continuous    Liters 2   when available   Maintain Oxygen Saturation 88% or higher      Recumbant Bike   Level 1    RPM 60    Minutes 15    METs 1      NuStep   Level 1    SPM 80    Minutes 15    METs 1      REL-XR   Level 1    Speed 50    Minutes 15    METs 1      Prescription Details   Frequency (times per week) 3    Duration Progress to 30 minutes of continuous aerobic without signs/symptoms of physical distress      Intensity   THRR 40-80% of Max Heartrate 111-134    Ratings of Perceived Exertion 11-13    Perceived Dyspnea 0-4      Progression   Progression Continue to progress workloads to maintain intensity without signs/symptoms of physical distress.      Resistance Training   Training Prescription Yes    Weight 2 lb    Reps 10-15             Perform Capillary Blood Glucose checks as needed.  Exercise Prescription Changes:   Exercise Prescription Changes     Row Name 12/15/20 1400 12/26/20 0900 01/09/21 1400 01/23/21 1000       Response to Exercise   Blood Pressure (Admit) 140/70 134/72 142/60 148/78    Blood Pressure (Exercise) 162/72 142/76 -- 138/76    Blood Pressure (Exit) 144/74 118/60 142/82 116/78    Heart Rate (Admit) 89 bpm 97 bpm 101 bpm 100 bpm    Heart Rate (Exercise) 102 bpm 104 bpm 104 bpm 95 bpm    Heart Rate (Exit) 84 bpm 89 bpm  95 bpm 90 bpm    Oxygen Saturation (Admit) 94 % 96 % 94 % 96 %    Oxygen Saturation (Exercise) 93 % 94 % 94 % 94 %    Oxygen Saturation (Exit) 96 % 96 % 94 % 95 %    Rating of Perceived Exertion (Exercise) '15 15 15 15    ' Perceived Dyspnea (Exercise) '3 3 3 3    ' Symptoms SOB, right knee pain 5/10 SOB SOB SOB    Comments walk test results second full day of exercise -- --    Duration -- Progress to 30 minutes of  aerobic without signs/symptoms of physical distress Progress to 30 minutes of  aerobic without signs/symptoms of physical distress Progress to 30  minutes of  aerobic without signs/symptoms of physical distress    Intensity -- THRR unchanged THRR unchanged THRR unchanged      Progression   Progression -- Continue to progress workloads to maintain intensity without signs/symptoms of physical distress. Continue to progress workloads to maintain intensity without signs/symptoms of physical distress. Continue to progress workloads to maintain intensity without signs/symptoms of physical distress.    Average METs -- 1.25 1.6 1.5      Resistance Training   Training Prescription -- Yes Yes Yes    Weight -- 2 lb 2 lb 2 lb    Reps -- 10-15 10-15 10-15      Interval Training   Interval Training -- No No No      Oxygen   Oxygen -- --  Currently using Room Air Continuous --    Liters -- -- 2 --      NuStep   Level -- '1 4 4    ' Minutes -- '30 30 30    ' METs -- 1.5 1.6 2      Biostep-RELP   Level -- 1 -- 1    Minutes -- 30 -- 30    METs -- 1 -- 1      Oxygen   Maintain Oxygen Saturation -- 88% or higher 88% or higher 88% or higher             Exercise Comments:   Exercise Comments     Row Name 12/21/20 1001           Exercise Comments First full day of exercise!  Patient was oriented to gym and equipment including functions, settings, policies, and procedures.  Patient's individual exercise prescription and treatment plan were reviewed.  All starting workloads were established based on the results of the 6 minute walk test done at initial orientation visit.  The plan for exercise progression was also introduced and progression will be customized based on patient's performance and goals.                Exercise Goals and Review:   Exercise Goals     Row Name 12/15/20 1436             Exercise Goals   Increase Physical Activity Yes       Intervention Provide advice, education, support and counseling about physical activity/exercise needs.;Develop an individualized exercise prescription for aerobic and resistive  training based on initial evaluation findings, risk stratification, comorbidities and participant's personal goals.       Expected Outcomes Short Term: Attend rehab on a regular basis to increase amount of physical activity.;Long Term: Add in home exercise to make exercise part of routine and to increase amount of physical activity.;Long Term: Exercising regularly at least 3-5  days a week.       Increase Strength and Stamina Yes       Intervention Provide advice, education, support and counseling about physical activity/exercise needs.;Develop an individualized exercise prescription for aerobic and resistive training based on initial evaluation findings, risk stratification, comorbidities and participant's personal goals.       Expected Outcomes Short Term: Increase workloads from initial exercise prescription for resistance, speed, and METs.;Short Term: Perform resistance training exercises routinely during rehab and add in resistance training at home;Long Term: Improve cardiorespiratory fitness, muscular endurance and strength as measured by increased METs and functional capacity (6MWT)       Able to understand and use rate of perceived exertion (RPE) scale Yes       Intervention Provide education and explanation on how to use RPE scale       Expected Outcomes Short Term: Able to use RPE daily in rehab to express subjective intensity level;Long Term:  Able to use RPE to guide intensity level when exercising independently       Able to understand and use Dyspnea scale Yes       Intervention Provide education and explanation on how to use Dyspnea scale       Expected Outcomes Short Term: Able to use Dyspnea scale daily in rehab to express subjective sense of shortness of breath during exertion;Long Term: Able to use Dyspnea scale to guide intensity level when exercising independently       Knowledge and understanding of Target Heart Rate Range (THRR) Yes       Intervention Provide education and  explanation of THRR including how the numbers were predicted and where they are located for reference       Expected Outcomes Short Term: Able to state/look up THRR;Long Term: Able to use THRR to govern intensity when exercising independently;Short Term: Able to use daily as guideline for intensity in rehab       Able to check pulse independently Yes       Intervention Provide education and demonstration on how to check pulse in carotid and radial arteries.;Review the importance of being able to check your own pulse for safety during independent exercise       Expected Outcomes Short Term: Able to explain why pulse checking is important during independent exercise;Long Term: Able to check pulse independently and accurately       Understanding of Exercise Prescription Yes       Intervention Provide education, explanation, and written materials on patient's individual exercise prescription       Expected Outcomes Short Term: Able to explain program exercise prescription;Long Term: Able to explain home exercise prescription to exercise independently                Exercise Goals Re-Evaluation :  Exercise Goals Re-Evaluation     Row Name 12/21/20 1001 12/26/20 0940 01/09/21 1412 01/18/21 1034 01/23/21 1017     Exercise Goal Re-Evaluation   Exercise Goals Review Able to understand and use rate of perceived exertion (RPE) scale;Increase Physical Activity;Knowledge and understanding of Target Heart Rate Range (THRR);Understanding of Exercise Prescription;Increase Strength and Stamina;Able to understand and use Dyspnea scale;Able to check pulse independently Increase Physical Activity;Increase Strength and Stamina;Understanding of Exercise Prescription Increase Physical Activity;Increase Strength and Stamina Increase Physical Activity;Increase Strength and Stamina;Understanding of Exercise Prescription Increase Physical Activity;Increase Strength and Stamina   Comments Reviewed RPE and dyspnea scales,  THR and program prescription with pt today.  Pt voiced understanding and was given  a copy of goals to take home. Madeline Martinez is off to a good start in rehab.  She has completed her first two full days of exercise thus far.  We will continue to monitor her progress. Madeline Martinez has increased to level 4 on NS.  Staff will inquire baout her trying to walk a little in class. Reviewed home exercise with pt today.  Pt does not have a plan for exercise but we discussed multiple options. Patient refuses to go to a public gym due to fear of getting sick. She is quite limited mobility wise and relies on her wheelchair significantly. Walking is limited for patient. I encouraged her to use the [facility] as it is a smaller gym and would be familiar with the equipment and be able to use machines that scale to her fitness level. Patient does not have any ability to exercise at home. She states she has an exercise ball to use at home but we talked about importance of incorporating aerobic as well. We also talked about having someone there with her balance and safety reasons.  Reviewed THR, pulse, RPE, sign and symptoms, pulse oximetery and when to call 911 or MD.  Also discussed weather considerations and indoor options.  Pt voiced understanding. Madeline Martinez is following her current exercise prescription as best as she can. she is very limited with walking and is only able to tolerate the T4 and BS at this time. She did however, increase to level 4 on the T4 Nustep. Her HR is not hitting her THRZ but O2 saturations are stable staying above 88%.  Will continue to monitor for progression once appropriate for patient.   Expected Outcomes Short: Use RPE daily to regulate intensity. Long: Follow program prescription in THR. Short: Continue to attend rehab regularly Long: Continue to follow program prescription Short: try walking while in class Long: build overall stamina Short: Look into [employer] and start with 1 day of exercise at home Long:  Obtain ability to exercise independently at appropriate prescription Short: Continue increasing loads on machines as tolerated Long: Continue to increase overall MET leve            Discharge Exercise Prescription (Final Exercise Prescription Changes):  Exercise Prescription Changes - 01/23/21 1000       Response to Exercise   Blood Pressure (Admit) 148/78    Blood Pressure (Exercise) 138/76    Blood Pressure (Exit) 116/78    Heart Rate (Admit) 100 bpm    Heart Rate (Exercise) 95 bpm    Heart Rate (Exit) 90 bpm    Oxygen Saturation (Admit) 96 %    Oxygen Saturation (Exercise) 94 %    Oxygen Saturation (Exit) 95 %    Rating of Perceived Exertion (Exercise) 15    Perceived Dyspnea (Exercise) 3    Symptoms SOB    Duration Progress to 30 minutes of  aerobic without signs/symptoms of physical distress    Intensity THRR unchanged      Progression   Progression Continue to progress workloads to maintain intensity without signs/symptoms of physical distress.    Average METs 1.5      Resistance Training   Training Prescription Yes    Weight 2 lb    Reps 10-15      Interval Training   Interval Training No      NuStep   Level 4    Minutes 30    METs 2      Biostep-RELP   Level 1    Minutes  30    METs 1      Oxygen   Maintain Oxygen Saturation 88% or higher             Nutrition:  Target Goals: Understanding of nutrition guidelines, daily intake of sodium <1527m, cholesterol <204m calories 30% from fat and 7% or less from saturated fats, daily to have 5 or more servings of fruits and vegetables.  Education: All About Nutrition: -Group instruction provided by verbal, written material, interactive activities, discussions, models, and posters to present general guidelines for heart healthy nutrition including fat, fiber, MyPlate, the role of sodium in heart healthy nutrition, utilization of the nutrition label, and utilization of this knowledge for meal planning.  Follow up email sent as well. Written material given at graduation. [facility]  Education need identified 12/15/20       Biometrics:  Pre Biometrics - 12/15/20 1425       Pre Biometrics   Height 5' 0.5" (1.537 m)    Weight 208 lb 9.6 oz (94.6 kg)    BMI (Calculated) 40.05              Nutrition Therapy Plan and Nutrition Goals:  Nutrition Therapy & Goals - 01/04/21 1435       Personal Nutrition Goals   Comments Daughter told RD that they have already started making changes like eating eggs, adding blue berries and blackberries to yogurt.             Nutrition Assessments:  MEDIFICTS Score Key: ?70 Need to make dietary changes  40-70 Heart Healthy Diet ? 40 Therapeutic Level Cholesterol Diet  [facility]  Picture Your Plate Total Score on Admission 49      Picture Your Plate Scores: <4<67nhealthy dietary pattern with much room for improvement. 41-50 Dietary pattern unlikely to meet recommendations for good health and room for improvement. 51-60 More healthful dietary pattern, with some room for improvement.  >60 Healthy dietary pattern, although there may be some specific behaviors that could be improved.   Nutrition Goals Re-Evaluation:  Nutrition Goals Re-Evaluation     [place] 01/16/21 1124             Goals   Nutrition Goal LT: limit saturated fat <12g/day, limit Na <1.5g/day, eat at least 5 servings of fruits/vegetables per day       Comment Daughter told RD that they have already started making changes like eating eggs, adding blue berries and blackberries to yogurt a couple of weeks ago. Reviewed heart healthy eating and diabetes friendly eating from last visit. Discussed different fats this visit as ran out of time last visit. Daughter talked about reducing her [place] to 1 slice and reducing the  amount of butter she uses with cooking.         Personal Goal #2 Re-Evaluation   Personal Goal #2 ST: limit processed meats to < 3x/week, 1 slice of tu[place] instead of two, reducing the amount of butter in half with cooking LT: limit saturated fat <12g/day, limit Na <1.5g/day         Personal Goal #3 Re-Evaluation   Personal Goal #3 ST: combine fiber/fat/protein like peaches canned in own juice or water with cottage cheese or nuts/seeds LT: eat at least 5 servings of fruits/vegetables per day  Nutrition Goals Discharge (Final Nutrition Goals Re-Evaluation):  Nutrition Goals Re-Evaluation - 01/16/21 1124       Goals   Nutrition Goal LT: limit saturated fat <12g/day, limit Na <1.5g/day, eat at least 5 servings of fruits/vegetables per day    Comment Daughter told RD that they have already started making changes like eating eggs, adding blue berries and blackberries to yogurt a couple of weeks ago. Reviewed heart healthy eating and diabetes friendly eating from last visit. Discussed different fats this visit as ran out of time last visit. Daughter talked about reducing her [place] bacon to 1 slice and reducing the amount of butter she uses with cooking.      Personal Goal #2 Re-Evaluation   Personal Goal #2 ST: limit processed meats to < 3x/week, 1 slice of [place] bacon instead of two, reducing the amount of butter in half with cooking LT: limit saturated fat <12g/day, limit Na <1.5g/day      Personal Goal #3 Re-Evaluation   Personal Goal #3 ST: combine fiber/fat/protein like peaches canned in own juice or water with cottage cheese or nuts/seeds LT: eat at least 5 servings of fruits/vegetables per day             Psychosocial: Target Goals: Acknowledge presence or absence of significant depression and/or stress, maximize coping skills, provide positive support system. Participant is able to verbalize types and ability to use techniques and skills needed for reducing  stress and depression.   Education: Stress, Anxiety, and Depression - Group verbal and visual presentation to define topics covered.  Reviews how body is impacted by stress, anxiety, and depression.  Also discusses healthy ways to reduce stress and to treat/manage anxiety and depression.  Written material given at graduation.   Education: Sleep Hygiene -Provides group verbal and written instruction about how sleep can affect your health.  Define sleep hygiene, discuss sleep cycles and impact of sleep habits. Review good sleep hygiene tips.    Initial Review & Psychosocial Screening:  Initial Psych Review & Screening - 12/12/20 1327       Initial Review   Current issues with Current Depression;Current Anxiety/Panic;History of Depression;Current Psychotropic Meds;Current Stress Concerns    Source of Stress Concerns Chronic Illness;Unable to participate in former interests or hobbies;Unable to perform yard/household activities    Comments getting worked up for oxygen therapy, on meds for both depression and anxiety (feels well managed)      [place]? Yes   two daughters (lives with one of them, other near by), sister also comes to help as needed     Barriers   Psychosocial barriers to participate in program The patient should benefit from training in stress management and relaxation.;Psychosocial barriers identified (see note)      Screening Interventions   Interventions Encouraged to exercise;To provide support and resources with identified psychosocial needs;Provide feedback about the scores to participant    Expected Outcomes Short Term goal: Utilizing psychosocial counselor, staff and physician to assist with identification of specific Stressors or current issues interfering with healing process. Setting desired goal for each stressor or current issue identified.;Long Term Goal: Stressors or current issues are controlled or eliminated.;Short Term goal:  Identification and review with participant of any Quality of Life or Depression concerns found by scoring the questionnaire.;Long Term goal: The participant improves quality of Life and PHQ9 Scores as seen by post scores and/or verbalization of changes  Quality of Life Scores:  Scores of 19 and below usually indicate a poorer quality of life in these areas.  A difference of  2-3 points is a clinically meaningful difference.  A difference of 2-3 points in the total score of the Quality of Life Index has been associated with significant improvement in overall quality of life, self-image, physical symptoms, and general health in studies assessing change in quality of life.  PHQ-9: Recent Review Flowsheet Data     Depression screen [facility] 2/9 01/16/2021 12/15/2020   Decreased Interest 2 2   Down, Depressed, Hopeless 0 0   PHQ - 2 Score 2 2   Altered sleeping 2 3   Tired, decreased energy 2 2   Change in appetite 2 3   Feeling bad or failure about yourself  0 0   Trouble concentrating 0 3   Moving slowly or fidgety/restless 0 2   Suicidal thoughts 0 0   PHQ-9 Score 8 15   Difficult doing work/chores Somewhat difficult Somewhat difficult      Interpretation of Total Score  Total Score Depression Severity:  1-4 = Minimal depression, 5-9 = Mild depression, 10-14 = Moderate depression, 15-19 = Moderately severe depression, 20-27 = Severe depression   Psychosocial Evaluation and Intervention:  Psychosocial Evaluation - 12/12/20 1342       Psychosocial Evaluation & Interventions   Interventions Encouraged to exercise with the program and follow exercise prescription;Stress management education    Comments Madeline Martinez is coming into pulmonary rehab for asthma.  She has oxygen prescribed but they are still in the process of getting her set up for home oxygen therapy.  She was encouraged to also request a pulse oximeter to monitor her saturations at home.  Currently, she does not  have any portable equipment, thus we will see how she does on her walk test without oxygen.  She has also had a sleep study completed that shows that she needs a CPAP but it is currently on back order.  She is eager to start rehab and hopes to learn her limitations and be able to breathe better.  She lives at home with one of her two daughters who help her as needed.  She also has a sister that lives within a few hours drive that also stops by to help when needed.  She has a long history of both depression and anxiety and is on medication for both.  She is currently feeling like her mood is well managed and does not feel that she has any current symptoms.  We will continue to check in with her on these.    Expected Outcomes Short: Attend rehab to learn limits and breathing techniques to help her manage life better Long: Continued compliance on medication and mood boost with exercise too    Continue Psychosocial Services  Follow up required by staff             Psychosocial Re-Evaluation:   Psychosocial Discharge (Final Psychosocial Re-Evaluation):   Education: Education Goals: Education classes will be provided on a weekly basis, covering required topics. Participant will state understanding/return demonstration of topics presented.  Learning Barriers/Preferences:  Learning Barriers/Preferences - 12/12/20 1324       Learning Barriers/Preferences   Learning Barriers Sight   contacts and glasses, corneal transplant   Learning Preferences None             General Pulmonary Education Topics:  Infection Prevention: - Provides verbal and written material to individual  with discussion of infection control including proper hand washing and proper equipment cleaning during exercise session. Flowsheet Row Pulmonary Rehab from 01/18/2021 in [facility] Cardiac and Pulmonary Rehab  Education need identified 12/15/20  Date 12/15/20  Educator [place]  Instruction Review Code 1- Verbalizes  Understanding       Falls Prevention: - Provides verbal and written material to individual with discussion of falls prevention and safety. Flowsheet Row Pulmonary Rehab from 01/18/2021 in [facility] Cardiac and Pulmonary Rehab  Education need identified 12/15/20  Date 12/15/20  Educator [place]  Instruction Review Code 1- Verbalizes Understanding       Chronic Lung Disease Review: - Group verbal instruction with posters, models, PowerPoint presentations and videos,  to review new updates, new respiratory medications, new advancements in procedures and treatments. Providing information on websites and "800" numbers for continued self-education. Includes information about supplement oxygen, available portable oxygen systems, continuous and intermittent flow rates, oxygen safety, concentrators, and Medicare reimbursement for oxygen. Explanation of Pulmonary Drugs, including class, frequency, complications, importance of spacers, rinsing mouth after steroid MDI's, and proper cleaning methods for nebulizers. Review of basic lung anatomy and physiology related to function, structure, and complications of lung disease. Review of risk factors. Discussion about methods for diagnosing sleep apnea and types of masks and machines for OSA. Includes a review of the use of types of environmental controls: home humidity, furnaces, filters, dust mite/pet prevention, HEPA vacuums. Discussion about weather changes, air quality and the benefits of nasal washing. Instruction on Warning signs, infection symptoms, calling MD promptly, preventive modes, and value of vaccinations. Review of effective airway clearance, coughing and/or vibration techniques. Emphasizing that all should Create an Action Plan. Written material given at graduation. Flowsheet Row Pulmonary Rehab from 01/18/2021 in [facility] Cardiac and Pulmonary Rehab  Education need identified 12/15/20       AED/CPR: - Group verbal and written instruction with the use of  models to demonstrate the basic use of the AED with the basic ABC's of resuscitation.    Anatomy and Cardiac Procedures: - Group verbal and visual presentation and models provide information about basic cardiac anatomy and function. Reviews the testing methods done to diagnose heart disease and the outcomes of the test results. Describes the treatment choices: Medical Management, Angioplasty, or Coronary Bypass Surgery for treating various heart conditions including Myocardial Infarction, Angina, Valve Disease, and Cardiac Arrhythmias.  Written material given at graduation. Flowsheet Row Pulmonary Rehab from 01/18/2021 in [facility] Cardiac and Pulmonary Rehab  Date 12/28/20  Educator [place]  Instruction Review Code 1- Verbalizes Understanding       Medication Safety: - Group verbal and visual instruction to review commonly prescribed medications for heart and lung disease. Reviews the medication, class of the drug, and side effects. Includes the steps to properly store meds and maintain the prescription regimen.  Written material given at graduation. Flowsheet Row Pulmonary Rehab from 01/18/2021 in [facility] Cardiac and Pulmonary Rehab  Date 01/18/21  Educator [place]  Instruction Review Code 1- Verbalizes Understanding       Other: -Provides group and verbal instruction on various topics (see comments)   Knowledge Questionnaire Score:  Knowledge Questionnaire Score - 12/15/20 1429       Knowledge Questionnaire Score   Pre Score 16/18: Lung disease, Oxygen              Core Components/Risk Factors/Patient Goals at Admission:  Personal Goals and Risk Factors at Admission - 12/15/20 1436       Core Components/Risk Factors/Patient  Goals on Admission    Weight Management Yes;Weight Loss;Obesity    Intervention Weight Management: Develop a combined nutrition and exercise program designed to reach desired caloric intake, while maintaining appropriate intake of nutrient and fiber, sodium and  fats, and appropriate energy expenditure required for the weight goal.;Weight Management: Provide education and appropriate resources to help participant work on and attain dietary goals.    Admit Weight 208 lb (94.3 kg)    Goal Weight: Short Term 203 lb (92.1 kg)    Goal Weight: Long Term 190 lb (86.2 kg)    Expected Outcomes Short Term: Continue to assess and modify interventions until short term weight is achieved;Long Term: Adherence to nutrition and physical activity/exercise program aimed toward attainment of established weight goal;Weight Loss: Understanding of general recommendations for a balanced deficit meal plan, which promotes 1-2 lb weight loss per week and includes a negative energy balance of [phone] kcal/d;Understanding recommendations for meals to include 15-35% energy as protein, 25-35% energy from fat, 35-60% energy from carbohydrates, less than 247m of dietary cholesterol, 20-35 gm of total fiber daily;Understanding of distribution of calorie intake throughout the day with the consumption of 4-5 meals/snacks    Improve shortness of breath with ADL's Yes    Intervention Provide education, individualized exercise plan and daily activity instruction to help decrease symptoms of SOB with activities of daily living.    Expected Outcomes Short Term: Improve cardiorespiratory fitness to achieve a reduction of symptoms when performing ADLs;Long Term: Be able to perform more ADLs without symptoms or delay the onset of symptoms    Diabetes Yes    Intervention Provide education about signs/symptoms and action to take for hypo/hyperglycemia.;Provide education about proper nutrition, including hydration, and aerobic/resistive exercise prescription along with prescribed medications to achieve blood glucose in normal ranges: Fasting glucose 65-99 mg/dL    Expected Outcomes Short Term: Participant verbalizes understanding of the signs/symptoms and immediate care of hyper/hypoglycemia, proper foot  care and importance of medication, aerobic/resistive exercise and nutrition plan for blood glucose control.;Long Term: Attainment of HbA1C < 7%.    Hypertension Yes    Intervention Provide education on lifestyle modifcations including regular physical activity/exercise, weight management, moderate sodium restriction and increased consumption of fresh fruit, vegetables, and low fat dairy, alcohol moderation, and smoking cessation.;Monitor prescription use compliance.    Expected Outcomes Short Term: Continued assessment and intervention until BP is < 140/985mHG in hypertensive participants. < 130/806mG in hypertensive participants with diabetes, heart failure or chronic kidney disease.;Long Term: Maintenance of blood pressure at goal levels.    Lipids Yes    Intervention Provide education and support for participant on nutrition & aerobic/resistive exercise along with prescribed medications to achieve LDL <58m21mDL >40mg36m Expected Outcomes Short Term: Participant states understanding of desired cholesterol values and is compliant with medications prescribed. Participant is following exercise prescription and nutrition guidelines.;Long Term: Cholesterol controlled with medications as prescribed, with individualized exercise RX and with personalized nutrition plan. Value goals: LDL < 58mg,54m > 40 mg.             Education:Diabetes - Individual verbal and written instruction to review signs/symptoms of diabetes, desired ranges of glucose level fasting, after meals and with exercise. Acknowledge that pre and post exercise glucose checks will be done for 3 sessions at entry of program. Flowsheet Row [facility]  Date 12/12/20  Educator [place]  [facility] Review Code 1- Verbalizes Understanding  Know Your Numbers and Heart Failure: - Group verbal and visual instruction to discuss disease risk factors for cardiac and pulmonary  disease and treatment options.  Reviews associated critical values for Overweight/Obesity, Hypertension, Cholesterol, and Diabetes.  Discusses basics of heart failure: signs/symptoms and treatments.  Introduces Heart Failure Zone chart for action plan for heart failure.  Written material given at graduation.   Core Components/Risk Factors/Patient Goals Review:   Goals and Risk Factor Review     Row Name 01/18/21 1014             Core Components/Risk Factors/Patient Goals Review   Personal Goals Review Improve shortness of breath with ADL's;Weight Management/Obesity;Diabetes       Review Madeline Martinez has not been checking or monitoring anything at home. She has been sick for the last 2 weeks which hindered a lot for her. Her appetite is finally improving and she re-started her insulin (not sure why she stopped it before). We talked importance staying compliant with use and to contact doctor if she ever decides to stop or change the way she takes her medications. She does not take her blood sugars at home and discussed how imperative it is to start those again. She does not check weights currently and encouraged again to make sure she does not have any abnormal weight gain or weight loss. Discussed keeping a log of weights, sugar, BP, etc.. She has not thought about any weight loss goals. Her breathing has not gotten worse since starting the program but has not seen any significant improvement with SOB.       Expected Outcomes Short: Monitor weight and blood sugars at home continuously. Long: Manage lifestyle risk factors                Core Components/Risk Factors/Patient Goals at Discharge (Final Review):   Goals and Risk Factor Review - 01/18/21 1014       Core Components/Risk Factors/Patient Goals Review   Personal Goals Review Improve shortness of breath with ADL's;Weight Management/Obesity;Diabetes    Review Madeline Martinez has not been checking or monitoring anything at home. She has been sick  for the last 2 weeks which hindered a lot for her. Her appetite is finally improving and she re-started her insulin (not sure why she stopped it before). We talked importance staying compliant with use and to contact doctor if she ever decides to stop or change the way she takes her medications. She does not take her blood sugars at home and discussed how imperative it is to start those again. She does not check weights currently and encouraged again to make sure she does not have any abnormal weight gain or weight loss. Discussed keeping a log of weights, sugar, BP, etc.. She has not thought about any weight loss goals. Her breathing has not gotten worse since starting the program but has not seen any significant improvement with SOB.    Expected Outcomes Short: Monitor weight and blood sugars at home continuously. Long: Manage lifestyle risk factors             ITP Comments:  ITP Comments     Row Name 12/12/20 1341 12/15/20 1204 12/21/20 1001 12/28/20 0629 01/02/21 1042   ITP Comments Completed virtual orientation today.  EP evaluation is scheduled for Thursday 10/20 at 8am.  Documentation for diagnosis can be found in CE encounter 11/24/20. Completed 6MWT and gym orientation. Initial ITP created and sent for review to Dr. Ottie Glazier, Medical Director. First full day of  exercise!  Patient was oriented to gym and equipment including functions, settings, policies, and procedures.  Patient's individual exercise prescription and treatment plan were reviewed.  All starting workloads were established based on the results of the 6 minute walk test done at initial orientation visit.  The plan for exercise progression was also introduced and progression will be customized based on patient's performance and goals. 30 Day review completed. Medical Director ITP review done, changes made as directed, and signed approval by Medical Director.    New to program Completed initial consultation, scheduled follow-up  11/21 at 915am.    [place] Name 01/16/21 1123 01/25/21 0623 02/22/21 0643       ITP Comments Completed RD follow up 30 Day review completed. Medical Director ITP review done, changes made as directed, and signed approval by Medical Director. 30 Day review completed. Medical Director ITP review done, changes made as directed, and signed approval by Medical Director.              Comments:

## 2021-03-01 ENCOUNTER — Other Ambulatory Visit: Payer: Self-pay

## 2021-03-01 ENCOUNTER — Encounter: Payer: Medicare HMO | Attending: Pulmonary Disease

## 2021-03-01 DIAGNOSIS — J45909 Unspecified asthma, uncomplicated: Secondary | ICD-10-CM | POA: Insufficient documentation

## 2021-03-01 NOTE — Progress Notes (Signed)
Daily Session Note ° °Patient Details  °Name: Madeline Martinez °MRN: [id number] °Date of Birth: 06/05/1945 °Referring Provider:   °Flowsheet Row Pulmonary Rehab from 12/15/2020 in [facility] and [facility] Re[facility]  °Referring Provider Aleskerov, Fuad MD  ° °  ° ° °Encounter Date: 03/01/2021 ° °Check In: ° Session Check In - 03/01/21 0948   ° °  ° Check-In  ° Supervising physician immediately available to respond to emergencies See telemetry face sheet for immediately available ER MD   ° Location [facility]   ° Staff Present  , MPA, RN;Amanda Sommer, BA, ACSM CEP, Exercise Physiologist;Joseph [place], RCP,RRT,BSRT   ° Virtual Visit No   ° Medication changes reported     No   ° Fall or balance concerns reported    No   ° Warm-up and Cool-down Performed on first and last piece of equipment   ° Resistance Training Performed Yes   ° VAD Patient? No   ° PAD/SET Patient? No   °  ° Pain Assessment  ° Currently in Pain? No/denies   ° °  °  ° °  ° ° ° ° ° °Social History  ° °Tobacco Use  °Smoking Status Never  °Smokeless Tobacco Never  ° ° °Goals Met:  °Independence with exercise equipment °Exercise tolerated well °No report of concerns or symptoms today °Strength training completed today ° °Goals Unmet:  °Not Applicable ° °Comments: Pt able to follow exercise prescription today without complaint.  Will continue to monitor for progression. ° ° ° °Dr. Mark Miller is Medical Director for [facility].  °Dr. Fuad Aleskerov is Medical Director for [facility]. °

## 2021-03-03 ENCOUNTER — Encounter: Payer: Medicare HMO | Admitting: *Deleted

## 2021-03-03 ENCOUNTER — Other Ambulatory Visit: Payer: Self-pay

## 2021-03-03 DIAGNOSIS — J45909 Unspecified asthma, uncomplicated: Secondary | ICD-10-CM | POA: Diagnosis not present

## 2021-03-03 NOTE — Progress Notes (Signed)
Daily Session Note  Patient Details  Name: Madeline Martinez MRN: [id number] Date of Birth: 07-09-1945 Referring Provider:   Flowsheet Row Pulmonary Rehab from 12/15/2020 in [facility] Cardiac and Pulmonary Rehab  Referring Provider Ottie Glazier MD       Encounter Date: 03/03/2021  Check In:  Session Check In - 03/03/21 0943       Check-In   Supervising physician immediately available to respond to emergencies See telemetry face sheet for immediately available ER MD    Location [facility]    Staff Present Renita Papa, RN BSN;Joseph [place], RCP,RRT,BSRT;Jessica [place], [place], RCEP, CCRP, CCET    Virtual Visit No    Medication changes reported     No    Fall or balance concerns reported    No    Warm-up and Cool-down Performed on first and last piece of equipment    Resistance Training Performed Yes    VAD Patient? No    PAD/SET Patient? No      Pain Assessment   Currently in Pain? No/denies                Social History   Tobacco Use  Smoking Status Never  Smokeless Tobacco Never    Goals Met:  Independence with exercise equipment Exercise tolerated well No report of concerns or symptoms today Strength training completed today  Goals Unmet:  Not Applicable  Comments: Pt able to follow exercise prescription today without complaint.  Will continue to monitor for progression.    Dr. Emily Filbert is Medical Director for [place].  Dr. Ottie Glazier is Medical Director for [facility].

## 2021-03-06 ENCOUNTER — Encounter: Payer: Medicare HMO | Admitting: *Deleted

## 2021-03-06 ENCOUNTER — Other Ambulatory Visit: Payer: Self-pay

## 2021-03-06 DIAGNOSIS — J45909 Unspecified asthma, uncomplicated: Secondary | ICD-10-CM | POA: Diagnosis not present

## 2021-03-06 NOTE — Progress Notes (Signed)
Daily Session Note  Patient Details  Name: Madeline Martinez MRN: [id number] Date of Birth: 01/25/1946 Referring Provider:   Flowsheet Row Pulmonary Rehab from 12/15/2020 in [facility] Cardiac and Pulmonary Rehab  Referring Provider Ottie Glazier MD       Encounter Date: 03/06/2021  Check In:  Session Check In - 03/06/21 1026       Check-In   Supervising physician immediately available to respond to emergencies See telemetry face sheet for immediately available ER MD    Location [facility]    Staff Present Renita Papa, RN Moises Blood, BS, ACSM CEP, Exercise Physiologist;Amanda Oletta Darter, [place], ACSM CEP, Exercise Physiologist    Virtual Visit No    Medication changes reported     No    Fall or balance concerns reported    No    Warm-up and Cool-down Performed on first and last piece of equipment    Resistance Training Performed Yes    VAD Patient? No    PAD/SET Patient? No      Pain Assessment   Currently in Pain? No/denies                Social History   Tobacco Use  Smoking Status Never  Smokeless Tobacco Never    Goals Met:  Independence with exercise equipment Exercise tolerated well No report of concerns or symptoms today Strength training completed today  Goals Unmet:  Not Applicable  Comments: Pt able to follow exercise prescription today without complaint.  Will continue to monitor for progression.    Dr. Emily Filbert is Medical Director for [place].  Dr. Ottie Glazier is Medical Director for [facility].

## 2021-03-08 ENCOUNTER — Other Ambulatory Visit: Payer: Self-pay

## 2021-03-08 DIAGNOSIS — J45909 Unspecified asthma, uncomplicated: Secondary | ICD-10-CM

## 2021-03-08 NOTE — Progress Notes (Signed)
Daily Session Note  Patient Details  Name: Madeline Martinez MRN: [id number] Date of Birth: 05-23-1945 Referring Provider:   Flowsheet Row Pulmonary Rehab from 12/15/2020 in [facility] Cardiac and Pulmonary Rehab  Referring Provider Ottie Glazier MD       Encounter Date: 03/08/2021  Check In:  Session Check In - 03/08/21 0956       Check-In   Supervising physician immediately available to respond to emergencies See telemetry face sheet for immediately available ER MD    Location [facility]    Staff Present Birdie Sons, MPA, RN;Jessica Luan [place], MA, RCEP, CCRP, CCET;Joseph [place], [place]    Virtual Visit No    Medication changes reported     No    Fall or balance concerns reported    No    Warm-up and Cool-down Performed on first and last piece of equipment    Resistance Training Performed Yes    VAD Patient? No    PAD/SET Patient? No      Pain Assessment   Currently in Pain? No/denies                Social History   Tobacco Use  Smoking Status Never  Smokeless Tobacco Never    Goals Met:  Independence with exercise equipment Exercise tolerated well No report of concerns or symptoms today Strength training completed today  Goals Unmet:  Not Applicable  Comments: Pt able to follow exercise prescription today without complaint.  Will continue to monitor for progression.    Dr. Emily Filbert is Medical Director for [place].  Dr. Ottie Glazier is Medical Director for [facility].

## 2021-03-09 ENCOUNTER — Encounter: Payer: Medicare HMO | Admitting: *Deleted

## 2021-03-09 DIAGNOSIS — J45909 Unspecified asthma, uncomplicated: Secondary | ICD-10-CM | POA: Diagnosis not present

## 2021-03-09 NOTE — Progress Notes (Signed)
Daily Session Note  Patient Details  Name: Madeline Martinez MRN: [id number] Date of Birth: 1945/07/28 Referring Provider:   Flowsheet Row Pulmonary Rehab from 12/15/2020 in [facility] Cardiac and Pulmonary Rehab  Referring Provider Ottie Glazier MD       Encounter Date: 03/09/2021  Check In:  Session Check In - 03/09/21 1409       Check-In   Supervising physician immediately available to respond to emergencies See telemetry face sheet for immediately available ER MD    Location [facility]    Staff Present Renita Papa, RN BSN;Joseph Tessie Fass, [place]    Virtual Visit No    Medication changes reported     No    Fall or balance concerns reported    No    Warm-up and Cool-down Performed on first and last piece of equipment    Resistance Training Performed Yes    VAD Patient? No    PAD/SET Patient? No      Pain Assessment   Currently in Pain? No/denies                Social History   Tobacco Use  Smoking Status Never  Smokeless Tobacco Never    Goals Met:  Independence with exercise equipment Exercise tolerated well No report of concerns or symptoms today Strength training completed today  Goals Unmet:  Not Applicable  Comments: Pt able to follow exercise prescription today without complaint.  Will continue to monitor for progression.    Dr. Emily Filbert is Medical Director for [place].  Dr. Ottie Glazier is Medical Director for [facility] Pulmonary Rehabilitation.

## 2021-03-13 ENCOUNTER — Encounter: Payer: Medicare HMO | Admitting: *Deleted

## 2021-03-13 ENCOUNTER — Other Ambulatory Visit: Payer: Self-pay

## 2021-03-13 DIAGNOSIS — J45909 Unspecified asthma, uncomplicated: Secondary | ICD-10-CM | POA: Diagnosis not present

## 2021-03-13 NOTE — Progress Notes (Signed)
Daily Session Note  Patient Details  Name: Madeline Martinez MRN: [id number] Date of Birth: May 03, 1945 Referring Provider:   Flowsheet Row Pulmonary Rehab from 12/15/2020 in [facility] Cardiac and Pulmonary Rehab  Referring Provider Ottie Glazier MD       Encounter Date: 03/13/2021  Check In:  Session Check In - 03/13/21 1028       Check-In   Supervising physician immediately available to respond to emergencies See telemetry face sheet for immediately available ER MD    Location [facility]    Staff Present Renita Papa, RN Moises Blood, BS, ACSM CEP, Exercise Physiologist;Amanda Oletta Darter, [place], ACSM CEP, Exercise Physiologist    Virtual Visit No    Medication changes reported     No    Fall or balance concerns reported    No    Warm-up and Cool-down Performed on first and last piece of equipment    Resistance Training Performed Yes    VAD Patient? No    PAD/SET Patient? No      Pain Assessment   Currently in Pain? No/denies                Social History   Tobacco Use  Smoking Status Never  Smokeless Tobacco Never    Goals Met:  Proper associated with RPD/PD & O2 Sat Exercise tolerated well No report of concerns or symptoms today Strength training completed today  Goals Unmet:  Not Applicable  Comments: Pt able to follow exercise prescription today without complaint.  Will continue to monitor for progression.    Dr. Emily Filbert is Medical Director for [place].  Dr. Ottie Glazier is Medical Director for [facility].

## 2021-03-14 NOTE — Progress Notes (Signed)
03/15/2021 12:04 PM   Madeline Martinez 07/01/1945 [id number]  Referring provider: Casilda Martinez, [place] [address] [place],  [place] [address]  Chief Complaint  Patient presents with   Nocturia   Over Active Bladder    Urological history: 1. OAB -contributing factors of age, diabetes, anxiety and depression  -PVR 149 mL -managed with Myrbetriq 50 mg daily and oxybutynin XL 10 mg daily   2. Bilateral renal cysts -contrast CT 03/2020 - 6.6 cm hypodense, fluid attenuating right renal mass consistent with a cyst. 13 mm hypodense fluid attenuating left interpolar renal mass consistent with a cyst. 14 mm hypodense, fluid attenuating left interpolar renal mass consistent with a cyst  HPI: Madeline Martinez is a 76 y.o. female who presents today for one month follow up with her daughter, Madeline Martinez, after a trial of Gemtesa 75 mg daily.    At her visit on 02/07/2021, she was experiencing 1-7 daytime voids, nocturia x1 with mild urge.  She dis have some episodes of stress incontinence, leaking 1-2 times daily she goes through 2 absorbent underwear's daily.   She does not felt that the Myrbetriq and oxybutynin are working as she was not completely dry.  Conversation was had regarding realistic expectation and she was given samples of Gemtesa.  Also discussed undergoing more testing for sleep apnea.   Today, she is having 1-7 daytime voids, 1-2 nighttime urinations with a strong urge to urinate.  She does leak with stress.  She has 2 depends daily and is leaking 1-2 times weekly.  She does engage in toilet mapping.  PVR 149 mL   PMH: Past Medical History:  Diagnosis Date   Acid reflux    Anxiety    Depression    Diabetes mellitus    Dysrhythmia    Gout    Heart murmur    HTN (hypertension)    Hyperlipidemia    Sleep apnea     Surgical History: Past Surgical History:  Procedure Laterality Date   ABDOMINAL HYSTERECTOMY     BACK SURGERY  2007   DISCS   BIOPSY  10/08/2018    Procedure: BIOPSY;  Surgeon: Irving Copas., MD;  Location: [facility];  Service: Gastroenterology;;   COLONOSCOPY WITH PROPOFOL N/A 12/19/2017   Procedure: COLONOSCOPY WITH PROPOFOL;  Surgeon: Jonathon Bellows, MD;  Location: [facility] ENDOSCOPY;  Service: Gastroenterology;  Laterality: N/A;   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   ENDOSCOPIC MUCOSAL RESECTION N/A 10/08/2018   Procedure: ENDOSCOPIC MUCOSAL RESECTION;  Surgeon: Rush Landmark Telford Nab., MD;  Location: [facility] ENDOSCOPY;  Service: Gastroenterology;  Laterality: N/A;   ESOPHAGOGASTRODUODENOSCOPY (EGD) WITH PROPOFOL N/A 08/28/2018   Procedure: ESOPHAGOGASTRODUODENOSCOPY (EGD) WITH PROPOFOL;  Surgeon: Jonathon Bellows, MD;  Location: [facility] ENDOSCOPY;  Service: Gastroenterology;  Laterality: N/A;   ESOPHAGOGASTRODUODENOSCOPY (EGD) WITH PROPOFOL N/A 10/08/2018   Procedure: ESOPHAGOGASTRODUODENOSCOPY (EGD) WITH PROPOFOL;  Surgeon: Rush Landmark Telford Nab., MD;  Location: [facility] ENDOSCOPY;  Service: Gastroenterology;  Laterality: N/A;   EUS N/A 10/08/2018   Procedure: UPPER ENDOSCOPIC ULTRASOUND (EUS) RADIAL;  Surgeon: Irving Copas., MD;  Location: [facility] ENDOSCOPY;  Service: Gastroenterology;  Laterality: N/A;   PARTIAL HYSTERECTOMY  1986   POLYPECTOMY  10/08/2018   Procedure: POLYPECTOMY;  Surgeon: Mansouraty, Telford Nab., MD;  Location: [facility]OS[facility];  Service: Gastroenterology;;    Home Medications:  Allergies as of 03/15/2021   No Known Allergies      Medication List        Accurate as of March 15, 2021  12:04 PM. If you have any questions, ask your nurse or doctor.          STOP taking these medications    oxybutynin 10 MG 24 hr tablet Commonly known as: DITROPAN-XL Stopped by: Zara Council, PA-C       TAKE these medications    allopurinol 100 MG tablet Commonly known as: ZYLOPRIM Take 100 mg by mouth daily with lunch.   aspirin 81 MG EC tablet Take by mouth.   atorvastatin 80 MG tablet Commonly known  as: LIPITOR Take 80 mg by mouth daily.   Biotin 5000 MCG Tabs Take 5,000 mcg by mouth daily. HAIR/SKIN/NAILS   carvedilol 12.5 MG tablet Commonly known as: COREG Take 12.5 mg by mouth 2 (two) times daily.   cetirizine 10 MG tablet Commonly known as: ZYRTEC Take 10 mg by mouth daily.   Dexilant 60 MG capsule Generic drug: dexlansoprazole Take 1 capsule by mouth daily.   ferrous sulfate 325 (65 FE) MG tablet Take 325 mg by mouth daily.   FLUoxetine 40 MG capsule Commonly known as: PROZAC Take 40 mg by mouth daily.   gabapentin 400 MG capsule Commonly known as: NEURONTIN Take 400 mg by mouth daily.   Gemtesa 75 MG Tabs Generic drug: Vibegron Take 75 mg by mouth daily. What changed: Another medication with the same name was added. Make sure you understand how and when to take each. Changed by: Zara Council, PA-C   Gemtesa 75 MG Tabs Generic drug: Vibegron Take 75 mg by mouth daily. What changed: You were already taking a medication with the same name, and this prescription was added. Make sure you understand how and when to take each. Changed by: Fredericka Bottcher, PA-C   glipiZIDE 5 MG 24 hr tablet Commonly known as: GLUCOTROL XL Take 5 mg by mouth 3 (three) times daily.   hydrochlorothiazide 25 MG tablet Commonly known as: HYDRODIURIL Take 25 mg by mouth daily.   HYDROcodone-acetaminophen 5-325 MG tablet Commonly known as: NORCO/VICODIN Take 1 tablet by mouth daily as needed (pain.).   levothyroxine 25 MCG tablet Commonly known as: SYNTHROID Take 25 mcg by mouth daily.   lidocaine 5 % Commonly known as: Lidoderm Place 1 patch onto the skin daily. Remove & Discard patch within 12 hours or as directed by MD   Linzess 290 MCG Caps capsule Generic drug: linaclotide TAKE 1 CAPSULE BY MOUTH DAILY BEFORE BREAKFAST.   LORazepam 1 MG tablet Commonly known as: ATIVAN Take 1 mg by mouth 4 (four) times daily.   losartan 100 MG tablet Commonly known as:  COZAAR Take 100 mg by mouth daily.   meloxicam 15 MG tablet Commonly known as: MOBIC Take 15 mg by mouth daily.   metFORMIN 1000 MG tablet Commonly known as: GLUCOPHAGE Take 1,000 mg by mouth 2 (two) times daily.   methocarbamol 750 MG tablet Commonly known as: ROBAXIN Take 750 mg by mouth daily with lunch.   mirtazapine 15 MG tablet Commonly known as: REMERON Take 15 mg by mouth at bedtime.   omeprazole 20 MG capsule Commonly known as: PRILOSEC TAKE 2 CAPSULES (40 MG TOTAL) BY MOUTH 2 (TWO) TIMES DAILY BEFORE A MEAL.   oxyCODONE-acetaminophen 5-325 MG tablet Commonly known as: PERCOCET/ROXICET Take 1 tablet by mouth every 4 (four) hours as needed.   pregabalin 75 MG capsule Commonly known as: LYRICA Take 75 mg by mouth 3 (three) times daily.   PROBIOTIC PO Take 1 tablet by mouth daily.   Toujeo SoloStar 300  UNIT/ML Solostar Pen Generic drug: insulin glargine (1 Unit Dial) Inject 20 Units into the skin daily.   Vitamin D-3 125 MCG (5000 UT) Tabs Take 5,000 Units by mouth daily.   vitamin E 45 MG (100 UNITS) capsule Take by mouth.        Allergies: No Known Allergies  Family History: Family History  Problem Relation Age of Onset   Breast cancer Mother    Heart failure Father    Other Sister        DEGEN.DISC DISEASE   Breast cancer Maternal Grandmother    Kidney cancer Neg Hx    Kidney disease Neg Hx    Prostate cancer Neg Hx     Social History:  reports that she has never smoked. She has never used smokeless tobacco. She reports that she does not drink alcohol and does not use drugs.  ROS: For pertinent review of systems please refer to history of present illness  Physical Exam: BP 126/72    Pulse 80    Ht 4\' 11"  (1.499 m)    Wt 205 lb (93 kg)    BMI 41.40 kg/m   Constitutional:  Well nourished. Alert and oriented, No acute distress. HEENT: [place] AT, mask in place.  Trachea midline Cardiovascular: No clubbing, cyanosis, or edema. Respiratory:  Normal respiratory effort, no increased work of breathing. Neurologic: Grossly intact, no focal deficits, moving all 4 extremities. Psychiatric: Normal mood and affect.    Laboratory Data: N/A   Pertinent Imaging:  03/15/21 11:19  Scan Result 157mL     Assessment & Plan:    1. Nighttime Incontinence -She feels that she is got a better response with the Gemtesa 75 mg daily versus the Myrbetriq and like to stay on this medication -Prescription sent for Gemtesa 75 mg to her pharmacy, it is listed is not on her formulary but I have given her resources for patient assistant programs to acquire the medication and we will also do what we can to help  2. Nocturia -May need a repeat sleep study in the future  3. Urgency See above   Return in about 3 months (around 06/13/2021) for PVR and OAB questionnaire.  These notes generated with voice recognition software. I apologize for typographical errors.  Zara Council, PA-C  [facility] [address] [place], [place] [address] [phone]

## 2021-03-15 ENCOUNTER — Ambulatory Visit: Payer: Medicare HMO | Admitting: Urology

## 2021-03-15 ENCOUNTER — Encounter: Payer: Self-pay | Admitting: Urology

## 2021-03-15 ENCOUNTER — Ambulatory Visit (INDEPENDENT_AMBULATORY_CARE_PROVIDER_SITE_OTHER): Payer: Medicare HMO | Admitting: Urology

## 2021-03-15 ENCOUNTER — Other Ambulatory Visit: Payer: Self-pay | Admitting: Urology

## 2021-03-15 ENCOUNTER — Other Ambulatory Visit: Payer: Self-pay

## 2021-03-15 VITALS — BP 126/72 | HR 80 | Ht 59.0 in | Wt 205.0 lb

## 2021-03-15 DIAGNOSIS — R351 Nocturia: Secondary | ICD-10-CM

## 2021-03-15 DIAGNOSIS — J45909 Unspecified asthma, uncomplicated: Secondary | ICD-10-CM

## 2021-03-15 DIAGNOSIS — R3915 Urgency of urination: Secondary | ICD-10-CM

## 2021-03-15 DIAGNOSIS — N3944 Nocturnal enuresis: Secondary | ICD-10-CM

## 2021-03-15 LAB — BLADDER SCAN AMB NON-IMAGING

## 2021-03-15 MED ORDER — GEMTESA 75 MG PO TABS: 75.0000 mg | ORAL_TABLET | Freq: Every day | ORAL | 3 refills | Status: DC

## 2021-03-15 NOTE — Progress Notes (Signed)
Daily Session Note  Patient Details  Name: Madeline Martinez MRN: [id number] Date of Birth: 1946/02/01 Referring Provider:   Flowsheet Row Pulmonary Rehab from 12/15/2020 in [facility] Cardiac and Pulmonary Rehab  Referring Provider Ottie Glazier MD       Encounter Date: 03/15/2021  Check In:  Session Check In - 03/15/21 0941       Check-In   Supervising physician immediately available to respond to emergencies See telemetry face sheet for immediately available ER MD    Location [facility]    Staff Present Birdie Sons, MPA, RN;Joseph Tessie Fass, RCP,RRT,BSRT;Amanda Oletta Darter, BA, ACSM CEP, Exercise Physiologist    Virtual Visit No    Medication changes reported     No    Fall or balance concerns reported    No    Warm-up and Cool-down Performed on first and last piece of equipment    Resistance Training Performed Yes    VAD Patient? No    PAD/SET Patient? No      Pain Assessment   Currently in Pain? No/denies                Social History   Tobacco Use  Smoking Status Never  Smokeless Tobacco Never    Goals Met:  Independence with exercise equipment Exercise tolerated well No report of concerns or symptoms today Strength training completed today  Goals Unmet:  Not Applicable  Comments: Pt able to follow exercise prescription today without complaint.  Will continue to monitor for progression.    Dr. Emily Filbert is Medical Director for [place].  Dr. Ottie Glazier is Medical Director for [facility] Pulmonary Rehabilitation.

## 2021-03-16 ENCOUNTER — Encounter: Payer: Medicare HMO | Admitting: *Deleted

## 2021-03-16 DIAGNOSIS — J45909 Unspecified asthma, uncomplicated: Secondary | ICD-10-CM

## 2021-03-16 NOTE — Progress Notes (Signed)
Daily Session Note  Patient Details  Name: Catlin Doria MRN: [id number] Date of Birth: 1945/07/05 Referring Provider:   Flowsheet Row Pulmonary Rehab from 12/15/2020 in [facility] Cardiac and Pulmonary Rehab  Referring Provider Ottie Glazier MD       Encounter Date: 03/16/2021  Check In:  Session Check In - 03/16/21 1403       Check-In   Supervising physician immediately available to respond to emergencies See telemetry face sheet for immediately available ER MD    Location [facility]    Staff Present Renita Papa, RN BSN;Joseph [place], RCP,RRT,BSRT;Jessica [place], [place], RCEP, CCRP, CCET    Virtual Visit No    Medication changes reported     No    Fall or balance concerns reported    No    Warm-up and Cool-down Performed on first and last piece of equipment    Resistance Training Performed Yes    VAD Patient? No    PAD/SET Patient? No      Pain Assessment   Currently in Pain? No/denies                Social History   Tobacco Use  Smoking Status Never  Smokeless Tobacco Never    Goals Met:  Independence with exercise equipment Exercise tolerated well No report of concerns or symptoms today Strength training completed today  Goals Unmet:  Not Applicable  Comments: Pt able to follow exercise prescription today without complaint.  Will continue to monitor for progression.    Dr. Emily Filbert is Medical Director for [place].  Dr. Ottie Glazier is Medical Director for [facility] Pulmonary Rehabilitation.

## 2021-03-16 NOTE — Telephone Encounter (Signed)
Refused change, will await prior auth from pharmacy

## 2021-03-20 ENCOUNTER — Telehealth: Payer: Self-pay | Admitting: Urology

## 2021-03-20 ENCOUNTER — Other Ambulatory Visit: Payer: Self-pay

## 2021-03-20 ENCOUNTER — Encounter: Payer: Medicare HMO | Admitting: *Deleted

## 2021-03-20 DIAGNOSIS — J45909 Unspecified asthma, uncomplicated: Secondary | ICD-10-CM

## 2021-03-20 NOTE — Progress Notes (Signed)
Daily Session Note  Patient Details  Name: Madeline Martinez MRN: [id number] Date of Birth: 12-27-1945 Referring Provider:   Flowsheet Row Pulmonary Rehab from 12/15/2020 in [facility] Cardiac and Pulmonary Rehab  Referring Provider Ottie Glazier MD       Encounter Date: 03/20/2021  Check In:  Session Check In - 03/20/21 1031       Check-In   Supervising physician immediately available to respond to emergencies See telemetry face sheet for immediately available ER MD    Location [facility]    Staff Present Renita Papa, RN BSN;Joseph [place], RCP,RRT,BSRT;Kelly [place], [place], ACSM CEP, Exercise Physiologist    Virtual Visit No    Medication changes reported     No    Fall or balance concerns reported    No    Warm-up and Cool-down Performed on first and last piece of equipment    Resistance Training Performed Yes    VAD Patient? No    PAD/SET Patient? No      Pain Assessment   Currently in Pain? No/denies                Social History   Tobacco Use  Smoking Status Never  Smokeless Tobacco Never    Goals Met:  Independence with exercise equipment Exercise tolerated well No report of concerns or symptoms today Strength training completed today  Goals Unmet:  Not Applicable  Comments: Pt able to follow exercise prescription today without complaint.  Will continue to monitor for progression.    Dr. Emily Filbert is Medical Director for [place].  Dr. Ottie Glazier is Medical Director for [facility] Pulmonary Rehabilitation.

## 2021-03-20 NOTE — Telephone Encounter (Signed)
If we prescribe another one of the medications that her insurance is suggesting, she will need to stop the oxybutynin.  If she feels she needs both the Myrbetriq and the oxybutynin to be at goal with her urinary symptoms, I suggest she remain on the Myrbetriq and oxybutynin.

## 2021-03-20 NOTE — Telephone Encounter (Signed)
Spoke with patient and she will continue myrbetriq and oxybutynin

## 2021-03-22 ENCOUNTER — Other Ambulatory Visit: Payer: Self-pay

## 2021-03-22 ENCOUNTER — Encounter: Payer: Self-pay | Admitting: *Deleted

## 2021-03-22 DIAGNOSIS — J45909 Unspecified asthma, uncomplicated: Secondary | ICD-10-CM

## 2021-03-22 NOTE — Progress Notes (Signed)
Daily Session Note  Patient Details  Name: Madeline Martinez MRN: [id number] Date of Birth: 1945/09/28 Referring Provider:   Flowsheet Row Pulmonary Rehab from 12/15/2020 in [facility]  Referring Provider Ottie Glazier MD       Encounter Date: 03/22/2021  Check In:  Session Check In - 03/22/21 0957       Check-In   Supervising physician immediately available to respond to emergencies See telemetry face sheet for immediately available ER MD    Location [facility]    Staff Present Birdie Sons, MPA, RN;Jessica Luan Pulling, MA, RCEP, CCRP, CCET;Joseph [place], [place]    Virtual Visit No    Medication changes reported     No    Fall or balance concerns reported    No    Warm-up and Cool-down Performed on first and last piece of equipment    Resistance Training Performed Yes    VAD Patient? No    PAD/SET Patient? No      Pain Assessment   Currently in Pain? No/denies                Social History   Tobacco Use  Smoking Status Never  Smokeless Tobacco Never    Goals Met:  Independence with exercise equipment Exercise tolerated well No report of concerns or symptoms today Strength training completed today  Goals Unmet:  Not Applicable  Comments: Pt able to follow exercise prescription today without complaint.  Will continue to monitor for progression.    Dr. Emily Filbert is Medical Director for [place].  Dr. Ottie Glazier is Medical Director for [facility].

## 2021-03-22 NOTE — Progress Notes (Signed)
Pulmonary Individual Treatment Plan  Patient Details  Name: Madeline Martinez MRN: [id number] Date of Birth: [birth year] Referring Provider:   Flowsheet Row Pulmonary Rehab from 12/15/2020 in [facility] Cardiac and Pulmonary Rehab  Referring Provider Ottie Glazier MD       Initial Encounter Date:  Flowsheet Row Pulmonary Rehab from 12/15/2020 in [facility] Cardiac and Pulmonary Rehab  Date 12/15/20       Visit Diagnosis: Asthma, unspecified asthma severity, unspecified whether complicated, unspecified whether persistent  Patient's Home Medications on Admission:  Current Outpatient Medications:    allopurinol (ZYLOPRIM) 100 MG tablet, Take 100 mg by mouth daily with lunch. , Disp: , Rfl:    aspirin 81 MG EC tablet, Take by mouth., Disp: , Rfl:    atorvastatin (LIPITOR) 80 MG tablet, Take 80 mg by mouth daily., Disp: , Rfl:    Biotin 5000 MCG TABS, Take 5,000 mcg by mouth daily. HAIR/SKIN/NAILS, Disp: , Rfl:    carvedilol (COREG) 12.5 MG tablet, Take 12.5 mg by mouth 2 (two) times daily., Disp: , Rfl:    cetirizine (ZYRTEC) 10 MG tablet, Take 10 mg by mouth daily., Disp: , Rfl:    Cholecalciferol (VITAMIN D-3) 125 MCG (5000 UT) TABS, Take 5,000 Units by mouth daily., Disp: , Rfl:    DEXILANT 60 MG capsule, Take 1 capsule by mouth daily., Disp: , Rfl:    ferrous sulfate 325 (65 FE) MG tablet, Take 325 mg by mouth daily., Disp: , Rfl:    FLUoxetine (PROZAC) 40 MG capsule, Take 40 mg by mouth daily., Disp: , Rfl:    gabapentin (NEURONTIN) 400 MG capsule, Take 400 mg by mouth daily., Disp: , Rfl:    glipiZIDE (GLUCOTROL XL) 5 MG 24 hr tablet, Take 5 mg by mouth 3 (three) times daily., Disp: , Rfl:    hydrochlorothiazide (HYDRODIURIL) 25 MG tablet, Take 25 mg by mouth daily., Disp: , Rfl:    HYDROcodone-acetaminophen (NORCO/VICODIN) 5-325 MG tablet, Take 1 tablet by mouth daily as needed (pain.). , Disp: , Rfl:    levothyroxine (SYNTHROID) 25 MCG tablet, Take 25 mcg by mouth daily., Disp: , Rfl:     lidocaine (LIDODERM) 5 %, Place 1 patch onto the skin daily. Remove & Discard patch within 12 hours or as directed by MD, Disp: 15 patch, Rfl: 0   LINZESS 290 MCG CAPS capsule, TAKE 1 CAPSULE BY MOUTH DAILY BEFORE BREAKFAST., Disp: 90 capsule, Rfl: 1   LORazepam (ATIVAN) 1 MG tablet, Take 1 mg by mouth 4 (four) times daily. , Disp: , Rfl:    losartan (COZAAR) 100 MG tablet, Take 100 mg by mouth daily., Disp: , Rfl:    meloxicam (MOBIC) 15 MG tablet, Take 15 mg by mouth daily. (Patient not taking: Reported on 03/15/2021), Disp: , Rfl:    metFORMIN (GLUCOPHAGE) 1000 MG tablet, Take 1,000 mg by mouth 2 (two) times daily., Disp: , Rfl:    methocarbamol (ROBAXIN) 750 MG tablet, Take 750 mg by mouth daily with lunch. (Patient not taking: Reported on 03/15/2021), Disp: , Rfl:    mirtazapine (REMERON) 15 MG tablet, Take 15 mg by mouth at bedtime. , Disp: , Rfl:    omeprazole (PRILOSEC) 20 MG capsule, TAKE 2 CAPSULES (40 MG TOTAL) BY MOUTH 2 (TWO) TIMES DAILY BEFORE A MEAL., Disp: 180 capsule, Rfl: 1   oxyCODONE-acetaminophen (PERCOCET/ROXICET) 5-325 MG tablet, Take 1 tablet by mouth every 4 (four) hours as needed., Disp: 15 tablet, Rfl: 0   pregabalin (LYRICA) 75 MG capsule, Take 75  mg by mouth 3 (three) times daily., Disp: , Rfl:    Probiotic Product (PROBIOTIC PO), Take 1 tablet by mouth daily., Disp: , Rfl:    TOUJEO SOLOSTAR 300 UNIT/ML SOPN, Inject 20 Units into the skin daily. , Disp: , Rfl:    Vibegron (GEMTESA) 75 MG TABS, Take 75 mg by mouth daily., Disp: 28 tablet, Rfl: 0   Vibegron (GEMTESA) 75 MG TABS, Take 75 mg by mouth daily., Disp: 90 tablet, Rfl: 3   vitamin E 45 MG (100 UNITS) capsule, Take by mouth., Disp: , Rfl:   Past Medical History: Past Medical History:  Diagnosis Date   Acid reflux    Anxiety    Depression    Diabetes mellitus    Dysrhythmia    Gout    Heart murmur    HTN (hypertension)    Hyperlipidemia    Sleep apnea     Tobacco Use: Social History   Tobacco Use   Smoking Status Never  Smokeless Tobacco Never    Labs: Recent Review Flowsheet Data     Labs for ITP Cardiac and Pulmonary Rehab Latest Ref Rng & Units 02/14/2010 04/01/2020   Cholestrol 0 - 200 mg/dL 217(A) -   LDLCALC mg/dL 135 -   HDL 35 - 70 mg/dL 61 -   Trlycerides 40 - 160 mg/dL 103 -   TCO2 22 - 32 mmol/L - 27        Pulmonary Assessment Scores:  Pulmonary Assessment Scores     Row Name 12/15/20 1427         ADL UCSD   ADL Phase Entry     SOB Score total 59     Rest 0     Walk 5     Stairs 5     Bath 0     Dress 5     Shop 4       CAT Score   CAT Score 37       mMRC Score   mMRC Score 3              UCSD: Self-administered rating of dyspnea associated with activities of daily living (ADLs) 6-point scale (0 = "not at all" to 5 = "maximal or unable to do because of breathlessness")  Scoring Scores range from 0 to 120.  Minimally important difference is 5 units  CAT: CAT can identify the health impairment of COPD patients and is better correlated with disease progression.  CAT has a scoring range of zero to 40. The CAT score is classified into four groups of low (less than 10), medium (10 - 20), high (21-30) and very high (31-40) based on the impact level of disease on health status. A CAT score over 10 suggests significant symptoms.  A worsening CAT score could be explained by an exacerbation, poor medication adherence, poor inhaler technique, or progression of COPD or comorbid conditions.  CAT MCID is 2 points  mMRC: mMRC (Modified Medical Research Council) Dyspnea Scale is used to assess the degree of baseline functional disability in patients of respiratory disease due to dyspnea. No minimal important difference is established. A decrease in score of 1 point or greater is considered a positive change.   Pulmonary Function Assessment:   Exercise Target Goals: Exercise Program Goal: Individual exercise prescription set using results from initial  6 min walk test and THRR while considering  patients activity barriers and safety.   Exercise Prescription Goal: Initial exercise prescription builds to 30-45 minutes  a day of aerobic activity, 2-3 days per week.  Home exercise guidelines will be given to patient during program as part of exercise prescription that the participant will acknowledge.  Education: Aerobic Exercise: - Group verbal and visual presentation on the components of exercise prescription. Introduces F.I.T.T principle from ACSM for exercise prescriptions.  Reviews F.I.T.T. principles of aerobic exercise including progression. Written material given at graduation.   Education: Resistance Exercise: - Group verbal and visual presentation on the components of exercise prescription. Introduces F.I.T.T principle from ACSM for exercise prescriptions  Reviews F.I.T.T. principles of resistance exercise including progression. Written material given at graduation. Flowsheet Row Pulmonary Rehab from 03/01/2021 in [facility] Cardiac and Pulmonary Rehab  Date 12/28/20  Educator [facility]  Instruction Review Code 1- Verbalizes Understanding        Education: Exercise & Equipment Safety: - Individual verbal instruction and demonstration of equipment use and safety with use of the equipment. Flowsheet Row Pulmonary Rehab from 03/01/2021 in [facility] Cardiac and Pulmonary Rehab  Education need identified 12/15/20  Date 12/15/20  Educator [place]  Instruction Review Code 1- Verbalizes Understanding       Education: Exercise Physiology & General Exercise Guidelines: - Group verbal and written instruction with models to review the exercise physiology of the cardiovascular system and associated critical values. Provides general exercise guidelines with specific guidelines to those with heart or lung disease.    Education: Flexibility, Balance, Mind/Body Relaxation: - Group verbal and visual presentation with interactive activity on the components of exercise  prescription. Introduces F.I.T.T principle from ACSM for exercise prescriptions. Reviews F.I.T.T. principles of flexibility and balance exercise training including progression. Also discusses the mind body connection.  Reviews various relaxation techniques to help reduce and manage stress (i.e. Deep breathing, progressive muscle relaxation, and visualization). Balance handout provided to take home. Written material given at graduation. Flowsheet Row Pulmonary Rehab from 03/01/2021 in [facility] Cardiac and Pulmonary Rehab  Date 01/04/21  Educator [facility]  Instruction Review Code 1- Verbalizes Understanding       Activity Barriers & Risk Stratification:  Activity Barriers & Cardiac Risk Stratification - 12/15/20 1425       Activity Barriers & Cardiac Risk Stratification   Activity Barriers Back Problems;Other (comment);Shortness of Breath;Muscular Weakness;Deconditioning;Joint Problems;Balance Concerns;History of Falls    Comments chronic back pain, hard for her to walk distance, occasional knee pain             6 Minute Walk:  6 Minute Walk     Row Name 12/15/20 1431         6 Minute Walk   Phase Initial     Distance 90 feet     Walk Time 4 minutes  Break 1:54-4:10     # of Rest Breaks 1     MPH 0.17     METS 0.1     RPE 15     Perceived Dyspnea  3     VO2 Peak 0.35     Symptoms Yes (comment)     Comments SOB, Right knee pain 5/10     Resting HR 89 bpm     Resting BP 140/70     Resting Oxygen Saturation  94 %     Exercise Oxygen Saturation  during 6 min walk 93 %     Max Ex. HR 102 bpm     Max Ex. BP 162/72     2 Minute Post BP 144/74       Interval HR   1  Minute HR 102     2 Minute HR 98     3 Minute HR 99     4 Minute HR 97     5 Minute HR 102     6 Minute HR 88     2 Minute Post HR 84     Interval Heart Rate? Yes       Interval Oxygen   Interval Oxygen? Yes     Baseline Oxygen Saturation % 94 %     1 Minute Oxygen Saturation % 93 %     1 Minute Liters of Oxygen 0  L  RA     2 Minute Oxygen Saturation % 95 %     2 Minute Liters of Oxygen 0 L     3 Minute Oxygen Saturation % 94 %     3 Minute Liters of Oxygen 0 L     4 Minute Oxygen Saturation % 97 %     4 Minute Liters of Oxygen 0 L     5 Minute Oxygen Saturation % 96 %     5 Minute Liters of Oxygen 0 L     6 Minute Oxygen Saturation % 95 %     6 Minute Liters of Oxygen 0 L     2 Minute Post Oxygen Saturation % 96 %     2 Minute Post Liters of Oxygen 0 L             Oxygen Initial Assessment:  Oxygen Initial Assessment - 01/18/21 1048       Home Oxygen   Home Oxygen Device None    Sleep Oxygen Prescription None    Home Exercise Oxygen Prescription None    Home Resting Oxygen Prescription None      Initial 6 min Walk   Oxygen Used None      Program Oxygen Prescription   Program Oxygen Prescription None;Continuous    Liters per minute 2   prn     Intervention   Short Term Goals To learn and exhibit compliance with exercise, home and travel O2 prescription;To learn and understand importance of monitoring SPO2 with pulse oximeter and demonstrate accurate use of the pulse oximeter.;To learn and understand importance of maintaining oxygen saturations>88%;To learn and demonstrate proper pursed lip breathing techniques or other breathing techniques.     Long  Term Goals Exhibits compliance with exercise, home  and travel O2 prescription;Verbalizes importance of monitoring SPO2 with pulse oximeter and return demonstration;Maintenance of O2 saturations>88%;Exhibits proper breathing techniques, such as pursed lip breathing or other method taught during program session;Compliance with respiratory medication             Oxygen Re-Evaluation:  Oxygen Re-Evaluation     Row Name 12/21/20 1002 01/18/21 1048 03/06/21 1006         Program Oxygen Prescription   Program Oxygen Prescription Continuous -- None     Liters per minute 2 -- --       Home Oxygen   Home Oxygen Device None -- None      Sleep Oxygen Prescription None -- None     Home Exercise Oxygen Prescription None -- None     Home Resting Oxygen Prescription None -- None       Goals/Expected Outcomes   Short Term Goals To learn and exhibit compliance with exercise, home and travel O2 prescription;To learn and understand importance of monitoring SPO2 with pulse oximeter and demonstrate accurate use of the pulse oximeter.;To learn and  understand importance of maintaining oxygen saturations>88%;To learn and demonstrate proper pursed lip breathing techniques or other breathing techniques.  -- To learn and understand importance of monitoring SPO2 with pulse oximeter and demonstrate accurate use of the pulse oximeter.;To learn and understand importance of maintaining oxygen saturations>88%;To learn and demonstrate proper pursed lip breathing techniques or other breathing techniques.      Long  Term Goals Exhibits compliance with exercise, home  and travel O2 prescription;Verbalizes importance of monitoring SPO2 with pulse oximeter and return demonstration;Maintenance of O2 saturations>88%;Exhibits proper breathing techniques, such as pursed lip breathing or other method taught during program session;Compliance with respiratory medication -- Verbalizes importance of monitoring SPO2 with pulse oximeter and return demonstration;Maintenance of O2 saturations>88%;Exhibits proper breathing techniques, such as pursed lip breathing or other method taught during program session;Compliance with respiratory medication     Comments Reviewed PLB technique with pt.  Talked about how it works and it's importance in maintaining their exercise saturations. Encouraged patient to use pulse ox at home to check that oxygen is staying above 88%. She uses PLB when needed and trying to move more at home. Madeline Martinez is doing well in rehab.  She continues to work on her PLB.  She is not using it all the time, but does find it helpful to control her SOB.  She has been  checking her oxygen levels at home and they are doing okay.     Goals/Expected Outcomes Short: Become more profiecient at using PLB.   Long: Become independent at using PLB. Short: Check O2 at home continuously   Long: Become independent at using PLB. Short; continue to use more PLB routinely Long: continue to manage lung disease              Oxygen Discharge (Final Oxygen Re-Evaluation):  Oxygen Re-Evaluation - 03/06/21 1006       Program Oxygen Prescription   Program Oxygen Prescription None      Home Oxygen   Home Oxygen Device None    Sleep Oxygen Prescription None    Home Exercise Oxygen Prescription None    Home Resting Oxygen Prescription None      Goals/Expected Outcomes   Short Term Goals To learn and understand importance of monitoring SPO2 with pulse oximeter and demonstrate accurate use of the pulse oximeter.;To learn and understand importance of maintaining oxygen saturations>88%;To learn and demonstrate proper pursed lip breathing techniques or other breathing techniques.     Long  Term Goals Verbalizes importance of monitoring SPO2 with pulse oximeter and return demonstration;Maintenance of O2 saturations>88%;Exhibits proper breathing techniques, such as pursed lip breathing or other method taught during program session;Compliance with respiratory medication    Comments Madeline Martinez is doing well in rehab.  She continues to work on her PLB.  She is not using it all the time, but does find it helpful to control her SOB.  She has been checking her oxygen levels at home and they are doing okay.    Goals/Expected Outcomes Short; continue to use more PLB routinely Long: continue to manage lung disease             Initial Exercise Prescription:  Initial Exercise Prescription - 12/15/20 1400       Date of Initial Exercise RX and Referring Provider   Date 12/15/20    Referring Provider Ottie Glazier MD      Oxygen   Oxygen Continuous    Liters 2   when available    Maintain Oxygen Saturation 88% or  higher      Recumbant Bike   Level 1    RPM 60    Minutes 15    METs 1      NuStep   Level 1    SPM 80    Minutes 15    METs 1      REL-XR   Level 1    Speed 50    Minutes 15    METs 1      Prescription Details   Frequency (times per week) 3    Duration Progress to 30 minutes of continuous aerobic without signs/symptoms of physical distress      Intensity   THRR 40-80% of Max Heartrate 111-134    Ratings of Perceived Exertion 11-13    Perceived Dyspnea 0-4      Progression   Progression Continue to progress workloads to maintain intensity without signs/symptoms of physical distress.      Resistance Training   Training Prescription Yes    Weight 2 lb    Reps 10-15             Perform Capillary Blood Glucose checks as needed.  Exercise Prescription Changes:   Exercise Prescription Changes     Row Name 12/15/20 1400 12/26/20 0900 01/09/21 1400 01/23/21 1000 03/20/21 1200     Response to Exercise   Blood Pressure (Admit) 140/70 134/72 142/60 148/78 110/60   Blood Pressure (Exercise) 162/72 142/76 -- 138/76 --   Blood Pressure (Exit) 144/74 118/60 142/82 116/78 126/64   Heart Rate (Admit) 89 bpm 97 bpm 101 bpm 100 bpm 68 bpm   Heart Rate (Exercise) 102 bpm 104 bpm 104 bpm 95 bpm 106 bpm   Heart Rate (Exit) 84 bpm 89 bpm 95 bpm 90 bpm 75 bpm   Oxygen Saturation (Admit) 94 % 96 % 94 % 96 % 96 %   Oxygen Saturation (Exercise) 93 % 94 % 94 % 94 % 94 %   Oxygen Saturation (Exit) 96 % 96 % 94 % 95 % 98 %   Rating of Perceived Exertion (Exercise) _0 Perceived Dyspnea (Exercise) _1 Symptoms SOB, right knee pain 5/10 SOB SOB SOB SOB   Comments walk test results second full day of exercise -- -- --   Duration -- Progress to 30 minutes of  aerobic without signs/symptoms of physical distress Progress to 30 minutes of  aerobic without signs/symptoms of physical distress Progress to 30 minutes of  aerobic without  signs/symptoms of physical distress Progress to 30 minutes of  aerobic without signs/symptoms of physical distress   Intensity -- THRR unchanged THRR unchanged THRR unchanged THRR unchanged     Progression   Progression -- Continue to progress workloads to maintain intensity without signs/symptoms of physical distress. Continue to progress workloads to maintain intensity without signs/symptoms of physical distress. Continue to progress workloads to maintain intensity without signs/symptoms of physical distress. Continue to progress workloads to maintain intensity without signs/symptoms of physical distress.   Average METs -- 1.25 1.6 1.5 1.47     Resistance Training   Training Prescription -- Yes Yes Yes Yes   Weight -- 2 lb 2 lb 2 lb 2 lb   Reps -- 10-15 10-15 10-15 10-15     Interval Training   Interval Training -- No No No No     Oxygen   Oxygen -- --  Currently using Room Air Continuous -- --   Liters -- --  2 -- --     Recumbant Bike   Level -- -- -- -- 1   Minutes -- -- -- -- 30     NuStep   Level -- _0 Minutes -- _1 METs -- 1.5 1.6 2 1.3     Biostep-RELP   Level -- 1 -- 1 1   Minutes -- 30 -- 30 30   METs -- 1 -- 1 2     Oxygen   Maintain Oxygen Saturation -- 88% or higher 88% or higher 88% or higher 88% or higher            Exercise Comments:   Exercise Comments     Row Name 12/21/20 1001           Exercise Comments First full day of exercise!  Patient was oriented to gym and equipment including functions, settings, policies, and procedures.  Patient's individual exercise prescription and treatment plan were reviewed.  All starting workloads were established based on the results of the 6 minute walk test done at initial orientation visit.  The plan for exercise progression was also introduced and progression will be customized based on patient's performance and goals.                Exercise Goals and Review:   Exercise Goals      Row Name 12/15/20 1436             Exercise Goals   Increase Physical Activity Yes       Intervention Provide advice, education, support and counseling about physical activity/exercise needs.;Develop an individualized exercise prescription for aerobic and resistive training based on initial evaluation findings, risk stratification, comorbidities and participant's personal goals.       Expected Outcomes Short Term: Attend rehab on a regular basis to increase amount of physical activity.;Long Term: Add in home exercise to make exercise part of routine and to increase amount of physical activity.;Long Term: Exercising regularly at least 3-5 days a week.       Increase Strength and Stamina Yes       Intervention Provide advice, education, support and counseling about physical activity/exercise needs.;Develop an individualized exercise prescription for aerobic and resistive training based on initial evaluation findings, risk stratification, comorbidities and participant's personal goals.       Expected Outcomes Short Term: Increase workloads from initial exercise prescription for resistance, speed, and METs.;Short Term: Perform resistance training exercises routinely during rehab and add in resistance training at home;Long Term: Improve cardiorespiratory fitness, muscular endurance and strength as measured by increased METs and functional capacity (6MWT)       Able to understand and use rate of perceived exertion (RPE) scale Yes       Intervention Provide education and explanation on how to use RPE scale       Expected Outcomes Short Term: Able to use RPE daily in rehab to express subjective intensity level;Long Term:  Able to use RPE to guide intensity level when exercising independently       Able to understand and use Dyspnea scale Yes       Intervention Provide education and explanation on how to use Dyspnea scale       Expected Outcomes Short Term: Able to use Dyspnea scale daily in rehab to  express subjective sense of shortness of breath during exertion;Long Term: Able to use Dyspnea scale to guide intensity level when exercising independently  Knowledge and understanding of Target Heart Rate Range (THRR) Yes       Intervention Provide education and explanation of THRR including how the numbers were predicted and where they are located for reference       Expected Outcomes Short Term: Able to state/look up THRR;Long Term: Able to use THRR to govern intensity when exercising independently;Short Term: Able to use daily as guideline for intensity in rehab       Able to check pulse independently Yes       Intervention Provide education and demonstration on how to check pulse in carotid and radial arteries.;Review the importance of being able to check your own pulse for safety during independent exercise       Expected Outcomes Short Term: Able to explain why pulse checking is important during independent exercise;Long Term: Able to check pulse independently and accurately       Understanding of Exercise Prescription Yes       Intervention Provide education, explanation, and written materials on patient's individual exercise prescription       Expected Outcomes Short Term: Able to explain program exercise prescription;Long Term: Able to explain home exercise prescription to exercise independently                Exercise Goals Re-Evaluation :  Exercise Goals Re-Evaluation     Row Name 12/21/20 1001 12/26/20 0940 01/09/21 1412 01/18/21 1034 01/23/21 1017     Exercise Goal Re-Evaluation   Exercise Goals Review Able to understand and use rate of perceived exertion (RPE) scale;Increase Physical Activity;Knowledge and understanding of Target Heart Rate Range (THRR);Understanding of Exercise Prescription;Increase Strength and Stamina;Able to understand and use Dyspnea scale;Able to check pulse independently Increase Physical Activity;Increase Strength and Stamina;Understanding of  Exercise Prescription Increase Physical Activity;Increase Strength and Stamina Increase Physical Activity;Increase Strength and Stamina;Understanding of Exercise Prescription Increase Physical Activity;Increase Strength and Stamina   Comments Reviewed RPE and dyspnea scales, THR and program prescription with pt today.  Pt voiced understanding and was given a copy of goals to take home. Madeline Martinez is off to a good start in rehab.  She has completed her first two full days of exercise thus far.  We will continue to monitor her progress. Madeline Martinez has increased to level 4 on NS.  Staff will inquire baout her trying to walk a little in class. Reviewed home exercise with pt today.  Pt does not have a plan for exercise but we discussed multiple options. Patient refuses to go to a public gym due to fear of getting sick. She is quite limited mobility wise and relies on her wheelchair significantly. Walking is limited for patient. I encouraged her to use the [facility] as it is a smaller gym and would be familiar with the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 support for participant on nutrition & aerobic/resistive exercise along with prescribed medications to achieve LDL <77m46mDL >40mg29m Expected Outcomes Short Term: Participant states understanding of desired cholesterol values and is compliant with medications prescribed. Participant is following exercise prescription and nutrition guidelines.;Long Term: Cholesterol controlled with medications as prescribed, with individualized exercise RX and with personalized nutrition plan. Value goals: LDL < 77mg,68m > 40 mg.  Education:Diabetes - Individual verbal and written instruction to review signs/symptoms of diabetes, desired ranges of glucose level fasting, after meals and with exercise. Acknowledge that pre and post exercise glucose checks will be done for 3 sessions at entry of program. Flowsheet Row Pulmonary Rehab from 12/12/2020 in [facility] Cardiac and Pulmonary Rehab  Date 12/12/20  Educator [facility]  Instruction Review Code 1- Verbalizes Understanding       Know Your Numbers and Heart Failure: - Group verbal and visual instruction to discuss disease risk factors for cardiac and pulmonary disease and treatment options.  Reviews associated critical values for Overweight/Obesity, Hypertension, Cholesterol, and Diabetes.  Discusses basics of heart failure: signs/symptoms and treatments.  Introduces Heart Failure Zone chart for action plan for heart failure.  Written material given at graduation.   Core Components/Risk Factors/Patient Goals Review:   Goals and Risk Factor Review     Row Name 01/18/21 1014 03/06/21 1003           Core Components/Risk Factors/Patient Goals Review   Personal Goals Review Improve shortness of breath with ADL's;Weight Management/Obesity;Diabetes Improve shortness of breath with  ADL's;Weight Management/Obesity;Diabetes;Hypertension      Review Madeline Martinez has not been checking or monitoring anything at home. She has been sick for the last 2 weeks which hindered a lot for her. Her appetite is finally improving and she re-started her insulin (not sure why she stopped it before). We talked importance staying compliant with use and to contact doctor if she ever decides to stop or change the way she takes her medications. She does not take her blood sugars at home and discussed how imperative it is to start those again. She does not check weights currently and encouraged again to make sure she does not have any abnormal weight gain or weight loss. Discussed keeping a log of weights, sugar, BP, etc.. She has not thought about any weight loss goals. Her breathing has not gotten worse since starting the program but has not seen any significant improvement with SOB. Madeline Martinez is doing well in rehab.  Her weight is staying about 203 lb regularly.  She's content to stay the same, just doesn't want to gain. She is good about taking her meds.  Her pressures are doing well and her sugars are coming back down to normal again.  She keeps a close eye on them.  She continues to work on her breathing and it comes and goes depending on the day.      Expected Outcomes Short: Monitor weight and blood sugars at home continuously. 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               Core Components/Risk Factors/Patient Goals at Discharge (Final Review):   Goals and Risk Factor Review - 03/06/21 1003       Core Components/Risk Factors/Patient Goals Review   Personal Goals Review Improve shortness of breath with ADL's;Weight Management/Obesity;Diabetes;Hypertension    Review Madeline Martinez is doing well in rehab.  Her weight is staying about 203 lb regularly.  She's content to stay the same, just doesn't want to gain. She is good about  taking her meds.  Her pressures are doing well and her sugars are coming back down to normal again.  She keeps a close eye on them.  She continues to work on her breathing and it comes and goes depending on the day.    Expected Outcomes Short: Continue to work on [employer]  loss Long: conitnue to managed diabetes.             ITP Comments:  ITP Comments     Row Name 12/12/20 1341 12/15/20 1204 12/21/20 1001 12/28/20 0629 01/02/21 1042   ITP Comments Completed virtual orientation today.  EP evaluation is scheduled for Thursday 10/20 at 8am.  Documentation for diagnosis can be found in CE encounter 11/24/20. Completed 6MWT and gym orientation. Initial ITP created and sent for review to Dr. Ottie Glazier, Medical Director. First full day of exercise!  Patient was oriented to gym and equipment including functions, settings, policies, and procedures.  Patient's individual exercise prescription and treatment plan were reviewed.  All starting workloads were established based on the results of the 6 minute walk test done at initial orientation visit.  The plan for exercise progression was also introduced and progression will be customized based on patient's performance and goals. 30 Day review completed. Medical Director ITP review done, changes made as directed, and signed approval by Medical Director.    New to program Completed initial consultation, scheduled follow-up 11/21 at 915am.    Madeline Martinez Name 01/16/21 1123 01/25/21 0623 02/22/21 0643 02/22/21 1007 03/22/21 0816   ITP Comments Completed RD follow up 30 Day review completed. Medical Director ITP review done, changes made as directed, and signed approval by Medical Director. 30 Day review completed. Medical Director ITP review done, changes made as directed, and signed approval by Medical Director. Madeline Martinez has been out since 01/23/21.  She had a lost family memeber recently and had gone to [place] to be with family. 30 Day review completed. Medical  Director ITP review done, changes made as directed, and signed approval by Medical Director.            Comments:

## 2021-03-23 ENCOUNTER — Other Ambulatory Visit: Payer: Self-pay

## 2021-03-23 DIAGNOSIS — J45909 Unspecified asthma, uncomplicated: Secondary | ICD-10-CM | POA: Diagnosis not present

## 2021-03-23 NOTE — Progress Notes (Signed)
Daily Session Note  Patient Details  Name: Madeline Martinez MRN: [id number] Date of Birth: May 14, 1945 Referring Provider:   Flowsheet Row Pulmonary Rehab from 12/15/2020 in [facility] Cardiac and Pulmonary Rehab  Referring Provider Ottie Glazier MD       Encounter Date: 03/23/2021  Check In:  Session Check In - 03/23/21 1415       Check-In   Supervising physician immediately available to respond to emergencies See telemetry face sheet for immediately available ER MD    Location [facility]-Cardiac & Pulmonary Rehab    Staff Present Hope Budds, RDN, LDN;Merriam Brandner, RN, BSN;Joseph [place], RCP,RRT,BSRT    Virtual Visit No    Medication changes reported     No    Fall or balance concerns reported    No    Warm-up and Cool-down Performed on first and last piece of equipment    Resistance Training Performed Yes    VAD Patient? No    PAD/SET Patient? No      Pain Assessment   Currently in Pain? No/denies                Social History   Tobacco Use  Smoking Status Never  Smokeless Tobacco Never    Goals Met:  Proper associated with RPD/PD & O2 Sat Independence with exercise equipment Exercise tolerated well No report of concerns or symptoms today Strength training completed today  Goals Unmet:  Not Applicable  Comments: Pt able to follow exercise prescription today without complaint.  Will continue to monitor for progression.   Dr. Emily Filbert is Medical Director for [place].  Dr. Ottie Glazier is Medical Director for [facility] Pulmonary Rehabilitation.

## 2021-03-26 ENCOUNTER — Other Ambulatory Visit: Payer: Self-pay | Admitting: Urology

## 2021-03-27 ENCOUNTER — Encounter: Payer: Medicare HMO | Admitting: *Deleted

## 2021-03-27 ENCOUNTER — Other Ambulatory Visit: Payer: Self-pay

## 2021-03-27 DIAGNOSIS — J45909 Unspecified asthma, uncomplicated: Secondary | ICD-10-CM | POA: Diagnosis not present

## 2021-03-27 NOTE — Progress Notes (Signed)
Daily Session Note  Patient Details  Name: Madeline Martinez MRN: [id number] Date of Birth: 08-Sep-1945 Referring Provider:   Flowsheet Row Pulmonary Rehab from 12/15/2020 in [facility] Cardiac and Pulmonary Rehab  Referring Provider Ottie Glazier MD       Encounter Date: 03/27/2021  Check In:  Session Check In - 03/27/21 1007       Check-In   Supervising physician immediately available to respond to emergencies See telemetry face sheet for immediately available ER MD    Location [facility]-Cardiac & Pulmonary Rehab    Staff Present Renita Papa, RN BSN;Joseph [place], RCP,RRT,BSRT;Laureen [place], [place], RRT, CPFT    Virtual Visit No    Medication changes reported     No    Fall or balance concerns reported    No    Warm-up and Cool-down Performed on first and last piece of equipment    Resistance Training Performed Yes    VAD Patient? No    PAD/SET Patient? No      Pain Assessment   Currently in Pain? No/denies                Social History   Tobacco Use  Smoking Status Never  Smokeless Tobacco Never    Goals Met:  Independence with exercise equipment Exercise tolerated well No report of concerns or symptoms today Strength training completed today  Goals Unmet:  Not Applicable  Comments: Pt able to follow exercise prescription today without complaint.  Will continue to monitor for progression.    Dr. Emily Filbert is Medical Director for [place].  Dr. Ottie Glazier is Medical Director for [facility] Pulmonary Rehabilitation.

## 2021-03-29 ENCOUNTER — Encounter: Payer: Medicare HMO | Attending: Pulmonary Disease

## 2021-03-29 ENCOUNTER — Other Ambulatory Visit: Payer: Self-pay

## 2021-03-29 DIAGNOSIS — J45909 Unspecified asthma, uncomplicated: Secondary | ICD-10-CM | POA: Insufficient documentation

## 2021-03-29 DIAGNOSIS — Z5189 Encounter for other specified aftercare: Secondary | ICD-10-CM | POA: Diagnosis not present

## 2021-03-29 NOTE — Progress Notes (Signed)
Daily Session Note  Patient Details  Name: Madeline Martinez MRN: [id number] Date of Birth: 1946-01-30 Referring Provider:   Flowsheet Row Pulmonary Rehab from 12/15/2020 in [facility] Cardiac and Pulmonary Rehab  Referring Provider Ottie Glazier MD       Encounter Date: 03/29/2021  Check In:  Session Check In - 03/29/21 0950       Check-In   Supervising physician immediately available to respond to emergencies See telemetry face sheet for immediately available ER MD    Location [facility]-Cardiac & Pulmonary Rehab    Staff Present Birdie Sons, MPA, RN;Joseph Tessie Fass, RCP,RRT,BSRT;Amanda Oletta Darter, BA, ACSM CEP, Exercise Physiologist    Virtual Visit No    Medication changes reported     No    Fall or balance concerns reported    No    Warm-up and Cool-down Performed on first and last piece of equipment    Resistance Training Performed Yes    VAD Patient? No    PAD/SET Patient? No      Pain Assessment   Currently in Pain? No/denies                Social History   Tobacco Use  Smoking Status Never  Smokeless Tobacco Never    Goals Met:  Independence with exercise equipment Exercise tolerated well No report of concerns or symptoms today Strength training completed today  Goals Unmet:  Not Applicable  Comments: Pt able to follow exercise prescription today without complaint.  Will continue to monitor for progression.    Dr. Emily Filbert is Medical Director for [place].  Dr. Ottie Glazier is Medical Director for [facility] Pulmonary Rehabilitation.

## 2021-03-30 ENCOUNTER — Other Ambulatory Visit: Payer: Self-pay

## 2021-03-30 ENCOUNTER — Encounter: Payer: Medicare HMO | Admitting: *Deleted

## 2021-03-30 DIAGNOSIS — J45909 Unspecified asthma, uncomplicated: Secondary | ICD-10-CM

## 2021-03-30 DIAGNOSIS — Z5189 Encounter for other specified aftercare: Secondary | ICD-10-CM | POA: Diagnosis not present

## 2021-03-30 NOTE — Progress Notes (Signed)
Daily Session Note  Patient Details  Name: Madeline Martinez MRN: [id number] Date of Birth: 02-05-1946 Referring Provider:   Flowsheet Row Pulmonary Rehab from 12/15/2020 in [facility] [place] [facility] Cardiac and Pulmonary Rehab  Referring Provider Ottie Glazier MD       Encounter Date: 03/30/2021  Check In:  Session Check In - 03/30/21 1403       Check-In   Supervising physician immediately available to respond to emergencies See telemetry face sheet for immediately available ER MD    Location [facility]-Cardiac & Pulmonary Rehab    Staff Present Renita Papa, RN BSN;Joseph [place], RCP,RRT,BSRT;Jessica [place], [place], RCEP, CCRP, CCET    Virtual Visit No    Medication changes reported     No    Fall or balance concerns reported    No    Warm-up and Cool-down Performed on first and last piece of equipment    Resistance Training Performed Yes    VAD Patient? No    PAD/SET Patient? No      Pain Assessment   Currently in Pain? No/denies                Social History   Tobacco Use  Smoking Status Never  Smokeless Tobacco Never    Goals Met:  Independence with exercise equipment Exercise tolerated well No report of concerns or symptoms today Strength training completed today  Goals Unmet:  Not Applicable  Comments: Pt able to follow exercise prescription today without complaint.  Will continue to monitor for progression.    Dr. Emily Filbert is Medical Director for [place].  Dr. Ottie Glazier is Medical Director for [facility] Pulmonary Rehabilitation.

## 2021-04-03 ENCOUNTER — Other Ambulatory Visit: Payer: Self-pay

## 2021-04-03 ENCOUNTER — Encounter: Payer: Medicare HMO | Admitting: *Deleted

## 2021-04-03 DIAGNOSIS — Z5189 Encounter for other specified aftercare: Secondary | ICD-10-CM | POA: Diagnosis not present

## 2021-04-03 DIAGNOSIS — J45909 Unspecified asthma, uncomplicated: Secondary | ICD-10-CM

## 2021-04-03 NOTE — Progress Notes (Signed)
Daily Session Note  Patient Details  Name: Madeline Martinez MRN: [id number] Date of Birth: March 08, 1945 Referring Provider:   Flowsheet Row Pulmonary Rehab from 12/15/2020 in [facility] Cardiac and Pulmonary Rehab  Referring Provider Ottie Glazier MD       Encounter Date: 04/03/2021  Check In:  Session Check In - 04/03/21 1029       Check-In   Supervising physician immediately available to respond to emergencies See telemetry face sheet for immediately available ER MD    Location [facility]-Cardiac & Pulmonary Rehab    Staff Present Renita Papa, RN Moises Blood, BS, ACSM CEP, Exercise Physiologist;Amanda Oletta Darter, [place], ACSM CEP, Exercise Physiologist    Virtual Visit No    Medication changes reported     No    Fall or balance concerns reported    No    Warm-up and Cool-down Performed on first and last piece of equipment    Resistance Training Performed Yes    VAD Patient? No    PAD/SET Patient? No      Pain Assessment   Currently in Pain? No/denies               Exercise Prescription Changes - 04/03/21 1000       Home Exercise Plan   Plans to continue exercise at Home (comment)    Frequency Add 1 additional day to program exercise sessions.    Initial Home Exercises Provided 01/18/21             Social History   Tobacco Use  Smoking Status Never  Smokeless Tobacco Never    Goals Met:  Independence with exercise equipment Exercise tolerated well No report of concerns or symptoms today Strength training completed today  Goals Unmet:  Not Applicable  Comments: Pt able to follow exercise prescription today without complaint.  Will continue to monitor for progression.    Dr. Emily Filbert is Medical Director for [place].  Dr. Ottie Glazier is Medical Director for [facility] Pulmonary Rehabilitation.

## 2021-04-05 ENCOUNTER — Other Ambulatory Visit: Payer: Self-pay

## 2021-04-05 DIAGNOSIS — Z5189 Encounter for other specified aftercare: Secondary | ICD-10-CM | POA: Diagnosis not present

## 2021-04-05 DIAGNOSIS — J45909 Unspecified asthma, uncomplicated: Secondary | ICD-10-CM

## 2021-04-05 NOTE — Progress Notes (Signed)
Daily Session Note  Patient Details  Name: Madeline Martinez MRN: [id number] Date of Birth: 08-31-1945 Referring Provider:   Flowsheet Row Pulmonary Rehab from 12/15/2020 in [facility] Cardiac and Pulmonary Rehab  Referring Provider Ottie Glazier MD       Encounter Date: 04/05/2021  Check In:  Session Check In - 04/05/21 0954       Check-In   Supervising physician immediately available to respond to emergencies See telemetry face sheet for immediately available ER MD    Location [facility]-Cardiac & Pulmonary Rehab    Staff Present Birdie Sons, MPA, RN;Amanda Oletta Darter, BA, ACSM CEP, Exercise Physiologist;Melissa Caiola, RDN, LDN    Virtual Visit No    Medication changes reported     No    Fall or balance concerns reported    No    Warm-up and Cool-down Performed on first and last piece of equipment    Resistance Training Performed Yes    VAD Patient? No    PAD/SET Patient? No      Pain Assessment   Currently in Pain? No/denies                Social History   Tobacco Use  Smoking Status Never  Smokeless Tobacco Never    Goals Met:  Independence with exercise equipment Exercise tolerated well No report of concerns or symptoms today Strength training completed today  Goals Unmet:  Not Applicable  Comments: Pt able to follow exercise prescription today without complaint.  Will continue to monitor for progression.    Dr. Emily Filbert is Medical Director for [place].  Dr. Ottie Glazier is Medical Director for [facility] Pulmonary Rehabilitation.

## 2021-04-06 ENCOUNTER — Other Ambulatory Visit: Payer: Self-pay

## 2021-04-06 ENCOUNTER — Encounter: Payer: Medicare HMO | Admitting: *Deleted

## 2021-04-06 DIAGNOSIS — Z5189 Encounter for other specified aftercare: Secondary | ICD-10-CM | POA: Diagnosis not present

## 2021-04-06 DIAGNOSIS — J45909 Unspecified asthma, uncomplicated: Secondary | ICD-10-CM

## 2021-04-06 NOTE — Progress Notes (Signed)
Daily Session Note  Patient Details  Name: Madeline Martinez MRN: [id number] Date of Birth: 1945/03/11 Referring Provider:   Flowsheet Row Pulmonary Rehab from 12/15/2020 in [facility] Cardiac and Pulmonary Rehab  Referring Provider Ottie Glazier MD       Encounter Date: 04/06/2021  Check In:  Session Check In - 04/06/21 1403       Check-In   Supervising physician immediately available to respond to emergencies See telemetry face sheet for immediately available ER MD    Location [facility]-Cardiac & Pulmonary Rehab    Staff Present Renita Papa, RN BSN;Joseph [place], RCP,RRT,BSRT;Jessica [place], [place], RCEP, CCRP, CCET    Virtual Visit No    Medication changes reported     No    Fall or balance concerns reported    No    Warm-up and Cool-down Performed on first and last piece of equipment    Resistance Training Performed Yes    VAD Patient? No    PAD/SET Patient? No      Pain Assessment   Currently in Pain? No/denies                Social History   Tobacco Use  Smoking Status Never  Smokeless Tobacco Never    Goals Met:  Independence with exercise equipment Exercise tolerated well No report of concerns or symptoms today Strength training completed today  Goals Unmet:  Not Applicable  Comments: Pt able to follow exercise prescription today without complaint.  Will continue to monitor for progression.    Dr. Emily Filbert is Medical Director for [place].  Dr. Ottie Glazier is Medical Director for [facility] Pulmonary Rehabilitation.

## 2021-04-10 ENCOUNTER — Other Ambulatory Visit: Payer: Self-pay

## 2021-04-10 ENCOUNTER — Encounter: Payer: Medicare HMO | Admitting: *Deleted

## 2021-04-10 DIAGNOSIS — J45909 Unspecified asthma, uncomplicated: Secondary | ICD-10-CM

## 2021-04-10 DIAGNOSIS — Z5189 Encounter for other specified aftercare: Secondary | ICD-10-CM | POA: Diagnosis not present

## 2021-04-10 NOTE — Progress Notes (Signed)
Daily Session Note  Patient Details  Name: Madeline Martinez MRN: [id number] Date of Birth: [birth year] Referring Provider:   Flowsheet Row Pulmonary Rehab from 12/15/2020 in [facility] Cardiac and Pulmonary Rehab  Referring Provider Ottie Glazier MD       Encounter Date: 04/10/2021  Check In:  Session Check In - 04/10/21 1028       Check-In   Supervising physician immediately available to respond to emergencies See telemetry face sheet for immediately available ER MD    Location [facility]-Cardiac & Pulmonary Rehab    Staff Present Renita Papa, RN Moises Blood, BS, ACSM CEP, Exercise Physiologist;Amanda Oletta Darter, [place], ACSM CEP, Exercise Physiologist    Virtual Visit No    Medication changes reported     No    Fall or balance concerns reported    No    Warm-up and Cool-down Performed on first and last piece of equipment    Resistance Training Performed Yes    VAD Patient? No    PAD/SET Patient? No      Pain Assessment   Currently in Pain? No/denies                Social History   Tobacco Use  Smoking Status Never  Smokeless Tobacco Never    Goals Met:  Independence with exercise equipment Personal goals reviewed No report of concerns or symptoms today Strength training completed today  Goals Unmet:  Not Applicable  Comments: Pt able to follow exercise prescription today without complaint.  Will continue to monitor for progression.    Dr. Emily Filbert is Medical Director for [place].  Dr. Ottie Glazier is Medical Director for [facility] Pulmonary Rehabilitation.

## 2021-04-13 ENCOUNTER — Encounter: Payer: Medicare HMO | Admitting: *Deleted

## 2021-04-13 ENCOUNTER — Other Ambulatory Visit: Payer: Self-pay

## 2021-04-13 VITALS — Ht 60.5 in | Wt 204.2 lb

## 2021-04-13 DIAGNOSIS — Z5189 Encounter for other specified aftercare: Secondary | ICD-10-CM | POA: Diagnosis not present

## 2021-04-13 DIAGNOSIS — J45909 Unspecified asthma, uncomplicated: Secondary | ICD-10-CM

## 2021-04-13 NOTE — Progress Notes (Signed)
Daily Session Note  Patient Details  Name: Madeline Martinez MRN: [id number] Date of Birth: 02-Dec-1945 Referring Provider:   Flowsheet Row Pulmonary Rehab from 12/15/2020 in [facility] Cardiac and Pulmonary Rehab  Referring Provider Ottie Glazier MD       Encounter Date: 04/13/2021  Check In:  Session Check In - 04/13/21 1352       Check-In   Supervising physician immediately available to respond to emergencies See telemetry face sheet for immediately available ER MD    Location [facility]-Cardiac & Pulmonary Rehab    Staff Present Renita Papa, RN BSN;Joseph [place], RCP,RRT,BSRT;Jessica [place], [place], RCEP, CCRP, CCET    Virtual Visit No    Medication changes reported     No    Fall or balance concerns reported    No    Warm-up and Cool-down Performed on first and last piece of equipment    Resistance Training Performed Yes    VAD Patient? No    PAD/SET Patient? No      Pain Assessment   Currently in Pain? No/denies                Social History   Tobacco Use  Smoking Status Never  Smokeless Tobacco Never    Goals Met:  Independence with exercise equipment Exercise tolerated well No report of concerns or symptoms today Strength training completed today  Goals Unmet:  Not Applicable  Comments: Pt able to follow exercise prescription today without complaint.  Will continue to monitor for progression.   [place] Name 12/15/20 1431 04/13/21 1428       6 Minute Walk   Phase Initial Discharge    Distance 90 feet 10 feet    Distance % Change -- -88.9 %    Distance Feet Change -- -80 ft    Walk Time 4 minutes  Break 1:54-4:10 4.58 minutes    # of Rest Breaks 1 0    MPH 0.17 0.02    METS 0.1 --    RPE 15 19    Perceived Dyspnea  3 3    VO2 Peak 0.35 --    Symptoms Yes (comment) Yes (comment)    Comments SOB, Right knee pain 5/10 knees feel like needles poking    Resting HR 89 bpm 94 bpm    Resting BP 140/70 132/64    Resting Oxygen Saturation  94  % 96 %    Exercise Oxygen Saturation  during 6 min walk 93 % 93 %    Max Ex. HR 102 bpm 100 bpm    Max Ex. BP 162/72 142/64    2 Minute Post BP 144/74 136/70      Interval HR   1 Minute HR 102 97    2 Minute HR 98 99    3 Minute HR 99 98    4 Minute HR 97 100    5 Minute HR 102 --    6 Minute HR 88 --    2 Minute Post HR 84 97    Interval Heart Rate? Yes Yes      Interval Oxygen   Interval Oxygen? Yes Yes    Baseline Oxygen Saturation % 94 % 96 %    1 Minute Oxygen Saturation % 93 % 94 %    1 Minute Liters of Oxygen 0 L  RA 0 L  Room Air    2 Minute Oxygen Saturation % 95 % 94 %    2  Minute Liters of Oxygen 0 L 0 L    3 Minute Oxygen Saturation % 94 % 93 %    3 Minute Liters of Oxygen 0 L 0 L    4 Minute Oxygen Saturation % 97 % 93 %    4 Minute Liters of Oxygen 0 L 0 L    5 Minute Oxygen Saturation % 96 % --    5 Minute Liters of Oxygen 0 L --    6 Minute Oxygen Saturation % 95 % --    6 Minute Liters of Oxygen 0 L --    2 Minute Post Oxygen Saturation % 96 % 94 %    2 Minute Post Liters of Oxygen 0 L 0 L              Dr. Emily Filbert is Medical Director for [place].  Dr. Ottie Glazier is Medical Director for [facility] Pulmonary Rehabilitation.

## 2021-04-17 ENCOUNTER — Encounter: Payer: Medicare HMO | Admitting: *Deleted

## 2021-04-17 ENCOUNTER — Other Ambulatory Visit: Payer: Self-pay

## 2021-04-17 DIAGNOSIS — Z5189 Encounter for other specified aftercare: Secondary | ICD-10-CM | POA: Diagnosis not present

## 2021-04-17 DIAGNOSIS — J45909 Unspecified asthma, uncomplicated: Secondary | ICD-10-CM

## 2021-04-17 NOTE — Progress Notes (Signed)
Daily Session Note  Patient Details  Name: Madeline Martinez MRN: [id number] Date of Birth: 03/06/1945 Referring Provider:   Flowsheet Row Pulmonary Rehab from 12/15/2020 in [facility] Cardiac and Pulmonary Rehab  Referring Provider Ottie Glazier MD       Encounter Date: 04/17/2021  Check In:  Session Check In - 04/17/21 1113       Check-In   Supervising physician immediately available to respond to emergencies See telemetry face sheet for immediately available ER MD    Location [facility]-Cardiac & Pulmonary Rehab    Staff Present Renita Papa, RN BSN;Joseph [place], RCP,RRT,BSRT;Kelly [place], [place], ACSM CEP, Exercise Physiologist    Virtual Visit No    Medication changes reported     No    Fall or balance concerns reported    No    Warm-up and Cool-down Performed on first and last piece of equipment    Resistance Training Performed Yes    VAD Patient? No    PAD/SET Patient? No      Pain Assessment   Currently in Pain? No/denies                Social History   Tobacco Use  Smoking Status Never  Smokeless Tobacco Never    Goals Met:  Independence with exercise equipment Exercise tolerated well No report of concerns or symptoms today Strength training completed today  Goals Unmet:  Not Applicable  Comments: Pt able to follow exercise prescription today without complaint.  Will continue to monitor for progression.    Dr. Emily Filbert is Medical Director for [place].  Dr. Ottie Glazier is Medical Director for [facility] [place] [facility] Pulmonary Rehabilitation.

## 2021-04-19 ENCOUNTER — Encounter: Payer: Self-pay | Admitting: *Deleted

## 2021-04-19 ENCOUNTER — Other Ambulatory Visit: Payer: Self-pay

## 2021-04-19 DIAGNOSIS — J45909 Unspecified asthma, uncomplicated: Secondary | ICD-10-CM

## 2021-04-19 DIAGNOSIS — Z5189 Encounter for other specified aftercare: Secondary | ICD-10-CM | POA: Diagnosis not present

## 2021-04-19 NOTE — Progress Notes (Signed)
Pulmonary Individual Treatment Plan  Patient Details  Name: Madeline Martinez MRN: [id number] Date of Birth: [birth year] Referring Provider:   Flowsheet Row Pulmonary Rehab from 12/15/2020 in [facility] Cardiac and Pulmonary Rehab  Referring Provider Ottie Glazier MD       Initial Encounter Date:  Flowsheet Row Pulmonary Rehab from 12/15/2020 in [facility] Cardiac and Pulmonary Rehab  Date 12/15/20       Visit Diagnosis: Asthma, unspecified asthma severity, unspecified whether complicated, unspecified whether persistent  Patient's Home Medications on Admission:  Current Outpatient Medications:    allopurinol (ZYLOPRIM) 100 MG tablet, Take 100 mg by mouth daily with lunch. , Disp: , Rfl:    aspirin 81 MG EC tablet, Take by mouth., Disp: , Rfl:    atorvastatin (LIPITOR) 80 MG tablet, Take 80 mg by mouth daily., Disp: , Rfl:    Biotin 5000 MCG TABS, Take 5,000 mcg by mouth daily. HAIR/SKIN/NAILS, Disp: , Rfl:    carvedilol (COREG) 12.5 MG tablet, Take 12.5 mg by mouth 2 (two) times daily., Disp: , Rfl:    cetirizine (ZYRTEC) 10 MG tablet, Take 10 mg by mouth daily., Disp: , Rfl:    Cholecalciferol (VITAMIN D-3) 125 MCG (5000 UT) TABS, Take 5,000 Units by mouth daily., Disp: , Rfl:    DEXILANT 60 MG capsule, Take 1 capsule by mouth daily., Disp: , Rfl:    ferrous sulfate 325 (65 FE) MG tablet, Take 325 mg by mouth daily., Disp: , Rfl:    FLUoxetine (PROZAC) 40 MG capsule, Take 40 mg by mouth daily., Disp: , Rfl:    gabapentin (NEURONTIN) 400 MG capsule, Take 400 mg by mouth daily., Disp: , Rfl:    glipiZIDE (GLUCOTROL XL) 5 MG 24 hr tablet, Take 5 mg by mouth 3 (three) times daily., Disp: , Rfl:    hydrochlorothiazide (HYDRODIURIL) 25 MG tablet, Take 25 mg by mouth daily., Disp: , Rfl:    HYDROcodone-acetaminophen (NORCO/VICODIN) 5-325 MG tablet, Take 1 tablet by mouth daily as needed (pain.). , Disp: , Rfl:    levothyroxine (SYNTHROID) 25 MCG tablet, Take 25 mcg by mouth daily., Disp: , Rfl:     lidocaine (LIDODERM) 5 %, Place 1 patch onto the skin daily. Remove & Discard patch within 12 hours or as directed by MD, Disp: 15 patch, Rfl: 0   LINZESS 290 MCG CAPS capsule, TAKE 1 CAPSULE BY MOUTH DAILY BEFORE BREAKFAST., Disp: 90 capsule, Rfl: 1   LORazepam (ATIVAN) 1 MG tablet, Take 1 mg by mouth 4 (four) times daily. , Disp: , Rfl:    losartan (COZAAR) 100 MG tablet, Take 100 mg by mouth daily., Disp: , Rfl:    meloxicam (MOBIC) 15 MG tablet, Take 15 mg by mouth daily. (Patient not taking: Reported on 03/15/2021), Disp: , Rfl:    metFORMIN (GLUCOPHAGE) 1000 MG tablet, Take 1,000 mg by mouth 2 (two) times daily., Disp: , Rfl:    methocarbamol (ROBAXIN) 750 MG tablet, Take 750 mg by mouth daily with lunch. (Patient not taking: Reported on 03/15/2021), Disp: , Rfl:    mirtazapine (REMERON) 15 MG tablet, Take 15 mg by mouth at bedtime. , Disp: , Rfl:    omeprazole (PRILOSEC) 20 MG capsule, TAKE 2 CAPSULES (40 MG TOTAL) BY MOUTH 2 (TWO) TIMES DAILY BEFORE A MEAL., Disp: 180 capsule, Rfl: 1   oxyCODONE-acetaminophen (PERCOCET/ROXICET) 5-325 MG tablet, Take 1 tablet by mouth every 4 (four) hours as needed., Disp: 15 tablet, Rfl: 0   pregabalin (LYRICA) 75 MG capsule, Take 75  mg by mouth 3 (three) times daily., Disp: , Rfl:    Probiotic Product (PROBIOTIC PO), Take 1 tablet by mouth daily., Disp: , Rfl:    TOUJEO SOLOSTAR 300 UNIT/ML SOPN, Inject 20 Units into the skin daily. , Disp: , Rfl:    Vibegron (GEMTESA) 75 MG TABS, Take 75 mg by mouth daily., Disp: 28 tablet, Rfl: 0   Vibegron (GEMTESA) 75 MG TABS, Take 75 mg by mouth daily., Disp: 90 tablet, Rfl: 3   vitamin E 45 MG (100 UNITS) capsule, Take by mouth., Disp: , Rfl:   Past Medical History: Past Medical History:  Diagnosis Date   Acid reflux    Anxiety    Depression    Diabetes mellitus    Dysrhythmia    Gout    Heart murmur    HTN (hypertension)    Hyperlipidemia    Sleep apnea     Tobacco Use: Social History   Tobacco Use   Smoking Status Never  Smokeless Tobacco Never    Labs: Recent Review Flowsheet Data     Labs for ITP Cardiac and Pulmonary Rehab Latest Ref Rng & Units 02/14/2010 04/01/2020   Cholestrol 0 - 200 mg/dL 217(A) -   LDLCALC mg/dL 135 -   HDL 35 - 70 mg/dL 61 -   Trlycerides 40 - 160 mg/dL 103 -   TCO2 22 - 32 mmol/L - 27        Pulmonary Assessment Scores:  Pulmonary Assessment Scores     Row Name 12/15/20 1427         ADL UCSD   ADL Phase Entry     SOB Score total 59     Rest 0     Walk 5     Stairs 5     Bath 0     Dress 5     Shop 4       CAT Score   CAT Score 37       mMRC Score   mMRC Score 3              UCSD: Self-administered rating of dyspnea associated with activities of daily living (ADLs) 6-point scale (0 = "not at all" to 5 = "maximal or unable to do because of breathlessness")  Scoring Scores range from 0 to 120.  Minimally important difference is 5 units  CAT: CAT can identify the health impairment of COPD patients and is better correlated with disease progression.  CAT has a scoring range of zero to 40. The CAT score is classified into four groups of low (less than 10), medium (10 - 20), high (21-30) and very high (31-40) based on the impact level of disease on health status. A CAT score over 10 suggests significant symptoms.  A worsening CAT score could be explained by an exacerbation, poor medication adherence, poor inhaler technique, or progression of COPD or comorbid conditions.  CAT MCID is 2 points  mMRC: mMRC (Modified Medical Research Council) Dyspnea Scale is used to assess the degree of baseline functional disability in patients of respiratory disease due to dyspnea. No minimal important difference is established. A decrease in score of 1 point or greater is considered a positive change.   Pulmonary Function Assessment:   Exercise Target Goals: Exercise Program Goal: Individual exercise prescription set using results from initial  6 min walk test and THRR while considering  patients activity barriers and safety.   Exercise Prescription Goal: Initial exercise prescription builds to 30-45 minutes  a day of aerobic activity, 2-3 days per week.  Home exercise guidelines will be given to patient during program as part of exercise prescription that the participant will acknowledge.  Education: Aerobic Exercise: - Group verbal and visual presentation on the components of exercise prescription. Introduces F.I.T.T principle from ACSM for exercise prescriptions.  Reviews F.I.T.T. principles of aerobic exercise including progression. Written material given at graduation.   Education: Resistance Exercise: - Group verbal and visual presentation on the components of exercise prescription. Introduces F.I.T.T principle from ACSM for exercise prescriptions  Reviews F.I.T.T. principles of resistance exercise including progression. Written material given at graduation. Flowsheet Row Pulmonary Rehab from 04/05/2021 in [facility] Cardiac and Pulmonary Rehab  Date 12/28/20  Educator [address] [facility]  Instruction Review Code 1- Verbalizes Understanding        Education: Exercise & Equipment Safety: - Individual verbal instruction and demonstration of equipment use and safety with use of the equipment. Flowsheet Row Pulmonary Rehab from 04/05/2021 in [facility] Cardiac and Pulmonary Rehab  Education need identified 12/15/20  Date 12/15/20  Educator [place]  Instruction Review Code 1- Verbalizes Understanding       Education: Exercise Physiology & General Exercise Guidelines: - Group verbal and written instruction with models to review the exercise physiology of the cardiovascular system and associated critical values. Provides general exercise guidelines with specific guidelines to those with heart or lung disease.    Education: Flexibility, Balance, Mind/Body Relaxation: - Group verbal and visual presentation with interactive activity on the components of exercise  prescription. Introduces F.I.T.T principle from ACSM for exercise prescriptions. Reviews F.I.T.T. principles of flexibility and balance exercise training including progression. Also discusses the mind body connection.  Reviews various relaxation techniques to help reduce and manage stress (i.e. Deep breathing, progressive muscle relaxation, and visualization). Balance handout provided to take home. Written material given at graduation. Flowsheet Row Pulmonary Rehab from 04/05/2021 in [facility] Cardiac and Pulmonary Rehab  Date 01/04/21  Educator [place]  Instruction Review Code 1- Verbalizes Understanding       Activity Barriers & Risk Stratification:  Activity Barriers & Cardiac Risk Stratification - 12/15/20 1425       Activity Barriers & Cardiac Risk Stratification   Activity Barriers Back Problems;Other (comment);Shortness of Breath;Muscular Weakness;Deconditioning;Joint Problems;Balance Concerns;History of Falls    Comments chronic back pain, hard for her to walk distance, occasional knee pain             6 Minute Walk:  6 Minute Walk     Row Name 12/15/20 1431 04/13/21 1428       6 Minute Walk   Phase Initial Discharge    Distance 90 feet 10 feet    Distance % Change -- -88.9 %    Distance Feet Change -- -80 ft    Walk Time 4 minutes  Break 1:54-4:10 4.58 minutes    # of Rest Breaks 1 0    MPH 0.17 0.02    METS 0.1 --    RPE 15 19    Perceived Dyspnea  3 3    VO2 Peak 0.35 --    Symptoms Yes (comment) Yes (comment)    Comments SOB, Right knee pain 5/10 knees feel like needles poking    Resting HR 89 bpm 94 bpm    Resting BP 140/70 132/64    Resting Oxygen Saturation  94 % 96 %    Exercise Oxygen Saturation  during 6 min walk 93 % 93 %    Max Ex. HR 102 bpm  100 bpm    Max Ex. BP 162/72 142/64    2 Minute Post BP 144/74 136/70      Interval HR   1 Minute HR 102 97    2 Minute HR 98 99    3 Minute HR 99 98    4 Minute HR 97 100    5 Minute HR 102 --    6 Minute HR 88  --    2 Minute Post HR 84 97    Interval Heart Rate? Yes Yes      Interval Oxygen   Interval Oxygen? Yes Yes    Baseline Oxygen Saturation % 94 % 96 %    1 Minute Oxygen Saturation % 93 % 94 %    1 Minute Liters of Oxygen 0 L  RA 0 L  Room Air    2 Minute Oxygen Saturation % 95 % 94 %    2 Minute Liters of Oxygen 0 L 0 L    3 Minute Oxygen Saturation % 94 % 93 %    3 Minute Liters of Oxygen 0 L 0 L    4 Minute Oxygen Saturation % 97 % 93 %    4 Minute Liters of Oxygen 0 L 0 L    5 Minute Oxygen Saturation % 96 % --    5 Minute Liters of Oxygen 0 L --    6 Minute Oxygen Saturation % 95 % --    6 Minute Liters of Oxygen 0 L --    2 Minute Post Oxygen Saturation % 96 % 94 %    2 Minute Post Liters of Oxygen 0 L 0 L            Oxygen Initial Assessment:  Oxygen Initial Assessment - 01/18/21 1048       Home Oxygen   Home Oxygen Device None    Sleep Oxygen Prescription None    Home Exercise Oxygen Prescription None    Home Resting Oxygen Prescription None      Initial 6 min Walk   Oxygen Used None      Program Oxygen Prescription   Program Oxygen Prescription None;Continuous    Liters per minute 2   prn     Intervention   Short Term Goals To learn and exhibit compliance with exercise, home and travel O2 prescription;To learn and understand importance of monitoring SPO2 with pulse oximeter and demonstrate accurate use of the pulse oximeter.;To learn and understand importance of maintaining oxygen saturations>88%;To learn and demonstrate proper pursed lip breathing techniques or other breathing techniques.     Long  Term Goals Exhibits compliance with exercise, home  and travel O2 prescription;Verbalizes importance of monitoring SPO2 with pulse oximeter and return demonstration;Maintenance of O2 saturations>88%;Exhibits proper breathing techniques, such as pursed lip breathing or other method taught during program session;Compliance with respiratory medication              Oxygen Re-Evaluation:  Oxygen Re-Evaluation     Row Name 12/21/20 1002 01/18/21 1048 03/06/21 1006 04/03/21 1019       Program Oxygen Prescription   Program Oxygen Prescription Continuous -- None None    Liters per minute 2 -- -- --      Home Oxygen   Home Oxygen Device None -- None None    Sleep Oxygen Prescription None -- None None    Home Exercise Oxygen Prescription None -- None None    Home Resting Oxygen Prescription None -- None None  Goals/Expected Outcomes   Short Term Goals To learn and exhibit compliance with exercise, home and travel O2 prescription;To learn and understand importance of monitoring SPO2 with pulse oximeter and demonstrate accurate use of the pulse oximeter.;To learn and understand importance of maintaining oxygen saturations>88%;To learn and demonstrate proper pursed lip breathing techniques or other breathing techniques.  -- To learn and understand importance of monitoring SPO2 with pulse oximeter and demonstrate accurate use of the pulse oximeter.;To learn and understand importance of maintaining oxygen saturations>88%;To learn and demonstrate proper pursed lip breathing techniques or other breathing techniques.  To learn and understand importance of monitoring SPO2 with pulse oximeter and demonstrate accurate use of the pulse oximeter.;To learn and understand importance of maintaining oxygen saturations>88%;To learn and demonstrate proper pursed lip breathing techniques or other breathing techniques.     Long  Term Goals Exhibits compliance with exercise, home  and travel O2 prescription;Verbalizes importance of monitoring SPO2 with pulse oximeter and return demonstration;Maintenance of O2 saturations>88%;Exhibits proper breathing techniques, such as pursed lip breathing or other method taught during program session;Compliance with respiratory medication -- Verbalizes importance of monitoring SPO2 with pulse oximeter and return demonstration;Maintenance of  O2 saturations>88%;Exhibits proper breathing techniques, such as pursed lip breathing or other method taught during program session;Compliance with respiratory medication Verbalizes importance of monitoring SPO2 with pulse oximeter and return demonstration;Maintenance of O2 saturations>88%;Exhibits proper breathing techniques, such as pursed lip breathing or other method taught during program session;Compliance with respiratory medication    Comments Reviewed PLB technique with pt.  Talked about how it works and it's importance in maintaining their exercise saturations. Encouraged patient to use pulse ox at home to check that oxygen is staying above 88%. She uses PLB when needed and trying to move more at home. Madeline Martinez is doing well in rehab.  She continues to work on her PLB.  She is not using it all the time, but does find it helpful to control her SOB.  She has been checking her oxygen levels at home and they are doing okay. Madeline Martinez has been doing well in rehab.  She is doing well with nebulizer and tries to stay prepared.  She wants to stay on top of everything.  Her oxygen saturations are doing well and she feels better with her breathing.    Goals/Expected Outcomes Short: Become more profiecient at using PLB.   Long: Become independent at using PLB. Short: Check O2 at home continuously   Long: Become independent at using PLB. Short; continue to use more PLB routinely Long: continue to manage lung disease Short: Contiue to work on PLB Long: Continue to manage lungs disease.             Oxygen Discharge (Final Oxygen Re-Evaluation):  Oxygen Re-Evaluation - 04/03/21 1019       Program Oxygen Prescription   Program Oxygen Prescription None      Home Oxygen   Home Oxygen Device None    Sleep Oxygen Prescription None    Home Exercise Oxygen Prescription None    Home Resting Oxygen Prescription None      Goals/Expected Outcomes   Short Term Goals To learn and understand importance of  monitoring SPO2 with pulse oximeter and demonstrate accurate use of the pulse oximeter.;To learn and understand importance of maintaining oxygen saturations>88%;To learn and demonstrate proper pursed lip breathing techniques or other breathing techniques.     Long  Term Goals Verbalizes importance of monitoring SPO2 with pulse oximeter and return demonstration;Maintenance of O2 saturations>88%;Exhibits  proper breathing techniques, such as pursed lip breathing or other method taught during program session;Compliance with respiratory medication    Comments Madeline Martinez has been doing well in rehab.  She is doing well with nebulizer and tries to stay prepared.  She wants to stay on top of everything.  Her oxygen saturations are doing well and she feels better with her breathing.    Goals/Expected Outcomes Short: Contiue to work on PLB Long: Continue to manage lungs disease.             Initial Exercise Prescription:  Initial Exercise Prescription - 12/15/20 1400       Date of Initial Exercise RX and Referring Provider   Date 12/15/20    Referring Provider Ottie Glazier MD      Oxygen   Oxygen Continuous    Liters 2   when available   Maintain Oxygen Saturation 88% or higher      Recumbant Bike   Level 1    RPM 60    Minutes 15    METs 1      NuStep   Level 1    SPM 80    Minutes 15    METs 1      REL-XR   Level 1    Speed 50    Minutes 15    METs 1      Prescription Details   Frequency (times per week) 3    Duration Progress to 30 minutes of continuous aerobic without signs/symptoms of physical distress      Intensity   THRR 40-80% of Max Heartrate 111-134    Ratings of Perceived Exertion 11-13    Perceived Dyspnea 0-4      Progression   Progression Continue to progress workloads to maintain intensity without signs/symptoms of physical distress.      Resistance Training   Training Prescription Yes    Weight 2 lb    Reps 10-15             Perform Capillary  Blood Glucose checks as needed.  Exercise Prescription Changes:   Exercise Prescription Changes     Row Name 12/15/20 1400 12/26/20 0900 01/09/21 1400 01/23/21 1000 03/20/21 1200     Response to Exercise   Blood Pressure (Admit) 140/70 134/72 142/60 148/78 110/60   Blood Pressure (Exercise) 162/72 142/76 -- 138/76 --   Blood Pressure (Exit) 144/74 118/60 142/82 116/78 126/64   Heart Rate (Admit) 89 bpm 97 bpm 101 bpm 100 bpm 68 bpm   Heart Rate (Exercise) 102 bpm 104 bpm 104 bpm 95 bpm 106 bpm   Heart Rate (Exit) 84 bpm 89 bpm 95 bpm 90 bpm 75 bpm   Oxygen Saturation (Admit) 94 % 96 % 94 % 96 % 96 %   Oxygen Saturation (Exercise) 93 % 94 % 94 % 94 % 94 %   Oxygen Saturation (Exit) 96 % 96 % 94 % 95 % 98 %   Rating of Perceived Exertion (Exercise) _0 Perceived Dyspnea (Exercise) _1 Symptoms SOB, right knee pain 5/10 SOB SOB SOB SOB   Comments walk test results second full day of exercise -- -- --   Duration -- Progress to 30 minutes of  aerobic without signs/symptoms of physical distress Progress to 30 minutes of  aerobic without signs/symptoms of physical distress Progress to 30 minutes of  aerobic without signs/symptoms of physical distress Progress to 30 minutes of  aerobic without signs/symptoms of physical distress   Intensity -- THRR unchanged THRR unchanged THRR unchanged THRR unchanged     Progression   Progression -- Continue to progress workloads to maintain intensity without signs/symptoms of physical distress. Continue to progress workloads to maintain intensity without signs/symptoms of physical distress. Continue to progress workloads to maintain intensity without signs/symptoms of physical distress. Continue to progress workloads to maintain intensity without signs/symptoms of physical distress.   Average METs -- 1.25 1.6 1.5 1.47     Resistance Training   Training Prescription -- Yes Yes Yes Yes   Weight -- 2 lb 2 lb 2 lb 2 lb   Reps -- 10-15  10-15 10-15 10-15     Interval Training   Interval Training -- No No No No     Oxygen   Oxygen -- --  Currently using Room Air Continuous -- --   Liters -- -- 2 -- --     Recumbant Bike   Level -- -- -- -- 1   Minutes -- -- -- -- 30     NuStep   Level -- _0 Minutes -- _1 METs -- 1.5 1.6 2 1.3     Biostep-RELP   Level -- 1 -- 1 1   Minutes -- 30 -- 30 30   METs -- 1 -- 1 2     Oxygen   Maintain Oxygen Saturation -- 88% or higher 88% or higher 88% or higher 88% or higher    Row Name 04/03/21 1000 04/03/21 1500 04/18/21 1100         Response to Exercise   Blood Pressure (Admit) -- 118/60 140/72     Blood Pressure (Exit) -- 104/62 122/82     Heart Rate (Admit) -- 90 bpm 90 bpm     Heart Rate (Exercise) -- 91 bpm 88 bpm     Heart Rate (Exit) -- 85 bpm 81 bpm     Oxygen Saturation (Admit) -- 94 % 96 %     Oxygen Saturation (Exercise) -- 92 % 96 %     Oxygen Saturation (Exit) -- 92 % 97 %     Rating of Perceived Exertion (Exercise) -- 12 13     Perceived Dyspnea (Exercise) -- 2 1     Symptoms -- SOB SOB     Duration -- Progress to 30 minutes of  aerobic without signs/symptoms of physical distress Continue with 30 min of aerobic exercise without signs/symptoms of physical distress.     Intensity -- -- THRR unchanged       Progression   Progression -- -- Continue to progress workloads to maintain intensity without signs/symptoms of physical distress.     Average METs -- -- 1.3       Resistance Training   Training Prescription -- -- Yes     Weight -- -- 2 lb     Reps -- -- 10-15       Interval Training   Interval Training -- -- No       T5 Nustep   Level -- -- 1     Minutes -- -- 15     METs -- -- 1.1       Biostep-RELP   Level -- -- 1     Minutes -- -- 15     METs -- -- 1       Home Exercise Plan   Plans to continue exercise at  Home (comment) Home (comment) Home (comment)     Frequency Add 1 additional day to program exercise sessions. Add  1 additional day to program exercise sessions. Add 1 additional day to program exercise sessions.     Initial Home Exercises Provided 01/18/21 01/18/21 01/18/21       Oxygen   Maintain Oxygen Saturation -- 88% or higher 88% or higher              Exercise Comments:   Exercise Comments     Row Name 12/21/20 1001           Exercise Comments First full day of exercise!  Patient was oriented to gym and equipment including functions, settings, policies, and procedures.  Patient's individual exercise prescription and treatment plan were reviewed.  All starting workloads were established based on the results of the 6 minute walk test done at initial orientation visit.  The plan for exercise progression was also introduced and progression will be customized based on patient's performance and goals.                Exercise Goals and Review:   Exercise Goals     Row Name 12/15/20 1436             Exercise Goals   Increase Physical Activity Yes       Intervention Provide advice, education, support and counseling about physical activity/exercise needs.;Develop an individualized exercise prescription for aerobic and resistive training based on initial evaluation findings, risk stratification, comorbidities and participant's personal goals.       Expected Outcomes Short Term: Attend rehab on a regular basis to increase amount of physical activity.;Long Term: Add in home exercise to make exercise part of routine and to increase amount of physical activity.;Long Term: Exercising regularly at least 3-5 days a week.       Increase Strength and Stamina Yes       Intervention Provide advice, education, support and counseling about physical activity/exercise needs.;Develop an individualized exercise prescription for aerobic and resistive training based on initial evaluation findings, risk stratification, comorbidities and participant's personal goals.       Expected Outcomes Short Term:  Increase workloads from initial exercise prescription for resistance, speed, and METs.;Short Term: Perform resistance training exercises routinely during rehab and add in resistance training at home;Long Term: Improve cardiorespiratory fitness, muscular endurance and strength as measured by increased METs and functional capacity (6MWT)       Able to understand and use rate of perceived exertion (RPE) scale Yes       Intervention Provide education and explanation on how to use RPE scale       Expected Outcomes Short Term: Able to use RPE daily in rehab to express subjective intensity level;Long Term:  Able to use RPE to guide intensity level when exercising independently       Able to understand and use Dyspnea scale Yes       Intervention Provide education and explanation on how to use Dyspnea scale       Expected Outcomes Short Term: Able to use Dyspnea scale daily in rehab to express subjective sense of shortness of breath during exertion;Long Term: Able to use Dyspnea scale to guide intensity level when exercising independently       Knowledge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 12/28/20  Educator SB   Instruction Review Code 1- Verbalizes Understanding       Medication Safety: - Group verbal and visual instruction to review commonly prescribed medications for heart and lung disease. Reviews the medication, class of the drug, and side effects. Includes the steps to properly store meds and maintain the prescription regimen.  Written material given at graduation. Flowsheet Row Pulmonary Rehab from 04/05/2021 in [facility] Cardiac and Pulmonary Rehab  Date 01/18/21  Educator SB  Instruction Review Code 1- Verbalizes Understanding       Other: -Provides group and verbal instruction on various topics (see comments)   Knowledge Questionnaire Score:  Knowledge Questionnaire Score - 12/15/20 1429       Knowledge Questionnaire Score   Pre Score 16/18: Lung disease, Oxygen              Core Components/Risk Factors/Patient Goals at Admission:  Personal Goals and Risk Factors at Admission - 12/15/20 1436       Core Components/Risk Factors/Patient Goals on Admission    Weight Management Yes;Weight Loss;Obesity    Intervention Weight Management: Develop a combined nutrition and exercise program designed to reach desired caloric intake, while maintaining appropriate intake of nutrient and fiber, sodium and fats, and appropriate energy expenditure required for the weight goal.;Weight Management: Provide education and appropriate resources to help participant work on and attain dietary goals.    Admit Weight 208 lb (94.3 kg)    Goal Weight: Short Term 203 lb (92.1 kg)    Goal Weight: Long Term 190 lb (86.2 kg)    Expected Outcomes Short Term: Continue to assess and modify interventions until short term weight is achieved;Long Term: Adherence to nutrition and physical activity/exercise program aimed toward attainment of established weight goal;Weight Loss: Understanding of general recommendations for a balanced deficit meal plan, which promotes 1-2 lb weight loss per week and includes a negative  energy balance of [phone] kcal/d;Understanding recommendations for meals to include 15-35% energy as protein, 25-35% energy from fat, 35-60% energy from carbohydrates, less than $RemoveB'200mg'RxuuCcKh$  of dietary cholesterol, 20-35 gm of total fiber daily;Understanding of distribution of calorie intake throughout the day with the consumption of 4-5 meals/snacks    Improve shortness of breath with ADL's Yes    Intervention Provide education, individualized exercise plan and daily activity instruction to help decrease symptoms of SOB with activities of daily living.    Expected Outcomes Short Term: Improve cardiorespiratory fitness to achieve a reduction of symptoms when performing ADLs;Long Term: Be able to perform more ADLs without symptoms or delay the onset of symptoms    Diabetes Yes    Intervention Provide education about signs/symptoms and action to take for hypo/hyperglycemia.;Provide education about proper nutrition, including hydration, and aerobic/resistive exercise prescription along with prescribed medications to achieve blood glucose in normal ranges: Fasting glucose 65-99 mg/dL    Expected Outcomes Short Term: Participant verbalizes understanding of the signs/symptoms and immediate care of hyper/hypoglycemia, proper foot care and importance of medication, aerobic/resistive exercise and nutrition plan for blood glucose control.;Long Term: Attainment of HbA1C < 7%.    Hypertension Yes    Intervention Provide education on lifestyle modifcations including regular physical activity/exercise, weight management, moderate sodium restriction and increased consumption of fresh fruit, vegetables, and low fat dairy, alcohol moderation, and smoking cessation.;Monitor prescription use compliance.    Expected Outcomes Short  Term: Continued assessment and intervention until BP is < 140/59mm HG in hypertensive participants. < 130/5mm HG in hypertensive participants with diabetes, heart failure or chronic kidney disease.;Long  Term: Maintenance of blood pressure at goal levels.    Lipids Yes    Intervention Provide education and support for participant on nutrition & aerobic/resistive exercise along with prescribed medications to achieve LDL '70mg'$ , HDL >$Remo'40mg'wjOXH$ .    Expected Outcomes Short Term: Participant states understanding of desired cholesterol values and is compliant with medications prescribed. Participant is following exercise prescription and nutrition guidelines.;Long Term: Cholesterol controlled with medications as prescribed, with individualized exercise RX and with personalized nutrition plan. Value goals: LDL < $Rem'70mg'cPWj$ , HDL > 40 mg.             Education:Diabetes - Individual verbal and written instruction to review signs/symptoms of diabetes, desired ranges of glucose level fasting, after meals and with exercise. Acknowledge that pre and post exercise glucose checks will be done for 3 sessions at entry of program. Flowsheet Row Pulmonary Rehab from 12/12/2020 in [facility] Cardiac and Pulmonary Rehab  Date 12/12/20  Educator [facility]  Instruction Review Code 1- Verbalizes Understanding       Know Your Numbers and Heart Failure: - Group verbal and visual instruction to discuss disease risk factors for cardiac and pulmonary disease and treatment options.  Reviews associated critical values for Overweight/Obesity, Hypertension, Cholesterol, and Diabetes.  Discusses basics of heart failure: signs/symptoms and treatments.  Introduces Heart Failure Zone chart for action plan for heart failure.  Written material given at graduation.   Core Components/Risk Factors/Patient Goals Review:   Goals and Risk Factor Review     Row Name 01/18/21 1014 03/06/21 1003 04/03/21 1018         Core Components/Risk Factors/Patient Goals Review   Personal Goals Review Improve shortness of breath with ADL's;Weight Management/Obesity;Diabetes Improve shortness of breath with ADL's;Weight Management/Obesity;Diabetes;Hypertension Improve  shortness of breath with ADL's;Weight Management/Obesity;Diabetes;Hypertension;Increase knowledge of respiratory medications and ability to use respiratory devices properly.     Review Madeline Martinez has not been checking or monitoring anything at home. She has been sick for the last 2 weeks which hindered a lot for her. Her appetite is finally improving and she re-started her insulin (not sure why she stopped it before). We talked importance staying compliant with use and to contact doctor if she ever decides to stop or change the way she takes her medications. She does not take her blood sugars at home and discussed how imperative it is to start those again. She does not check weights currently and encouraged again to make sure she does not have any abnormal weight gain or weight loss. Discussed keeping a log of weights, sugar, BP, etc.. She has not thought about any weight loss goals. Her breathing has not gotten worse since starting the program but has not seen any significant improvement with SOB. Madeline Martinez is doing well in rehab.  Her weight is staying about 203 lb regularly.  She's content to stay the same, just doesn't want to gain. She is good about taking her meds.  Her pressures are doing well and her sugars are coming back down to normal again.  She keeps a close eye on them.  She continues to work on her breathing and it comes and goes depending on the day. Madeline Martinez is doing well in rehab. Her weight is steady but she would like to lose, at least she is not gaining she says.  Her pressures have been doing  well.  Her sugars are doing well.  She is doing better with breathing and meds are good.     Expected Outcomes Short: Monitor weight and blood sugars at home continuously. 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 Short: Conitnue to work on weight loss Long: conitnue to monitor risk factors.              Core Components/Risk  Factors/Patient Goals at Discharge (Final Review):   Goals and Risk Factor Review - 04/03/21 1018       Core Components/Risk Factors/Patient Goals Review   Personal Goals Review Improve shortness of breath with ADL's;Weight Management/Obesity;Diabetes;Hypertension;Increase knowledge of respiratory medications and ability to use respiratory devices properly.    Review Madeline Martinez is doing well in rehab. Her weight is steady but she would like to lose, at least she is not gaining she says.  Her pressures have been doing well.  Her sugars are doing well.  She is doing better with breathing and meds are good.    Expected Outcomes Short: Conitnue to work on weight loss Long: conitnue to monitor risk factors.             ITP Comments:  ITP Comments     Row Name 12/12/20 1341 12/15/20 1204 12/21/20 1001 12/28/20 0629 01/02/21 1042   ITP Comments Completed virtual orientation today.  EP evaluation is scheduled for Thursday 10/20 at 8am.  Documentation for diagnosis can be found in CE encounter 11/24/20. Completed 6MWT and gym orientation. Initial ITP created and sent for review to Dr. Ottie Glazier, Medical Director. First full day of exercise!  Patient was oriented to gym and equipment including functions, settings, policies, and procedures.  Patient's individual exercise prescription and treatment plan were reviewed.  All starting workloads were established based on the results of the 6 minute walk test done at initial orientation visit.  The plan for exercise progression was also introduced and progression will be customized based on patient's performance and goals. 30 Day review completed. Medical Director ITP review done, changes made as directed, and signed approval by Medical Director.    New to program Completed initial consultation, scheduled follow-up 11/21 at 915am.    Madeline Martinez Name 01/16/21 1123 01/25/21 0623 02/22/21 0643 02/22/21 1007 03/22/21 0816   ITP Comments Completed RD follow up 30 Day  review completed. Medical Director ITP review done, changes made as directed, and signed approval by Medical Director. 30 Day review completed. Medical Director ITP review done, changes made as directed, and signed approval by Medical Director. Madeline Martinez has been out since 01/23/21.  She had a lost family memeber recently and had gone to [place] to be with family. 30 Day review completed. Medical Director ITP review done, changes made as directed, and signed approval by Medical Director.    Madeline Martinez Name 04/19/21 0739           ITP Comments 30 Day review completed. Medical Director ITP review done, changes made as directed, and signed approval by Medical Director.                Comments:

## 2021-04-19 NOTE — Progress Notes (Signed)
Daily Session Note  Patient Details  Name: Madeline Martinez MRN: [id number] Date of Birth: 12-28-1945 Referring Provider:   Flowsheet Row Pulmonary Rehab from 12/15/2020 in [facility] Cardiac and Pulmonary Rehab  Referring Provider Ottie Glazier MD       Encounter Date: 04/19/2021  Check In:  Session Check In - 04/19/21 1007       Check-In   Supervising physician immediately available to respond to emergencies See telemetry face sheet for immediately available ER MD    Location [facility]-Cardiac & Pulmonary Rehab    Staff Present Birdie Sons, MPA, RN;Joseph Tessie Fass, RCP,RRT,BSRT;Amanda Oletta Darter, BA, ACSM CEP, Exercise Physiologist    Virtual Visit No    Medication changes reported     No    Fall or balance concerns reported    No    Warm-up and Cool-down Performed on first and last piece of equipment    Resistance Training Performed Yes    VAD Patient? No    PAD/SET Patient? No      Pain Assessment   Currently in Pain? No/denies                Social History   Tobacco Use  Smoking Status Never  Smokeless Tobacco Never    Goals Met:  Independence with exercise equipment Exercise tolerated well No report of concerns or symptoms today Strength training completed today  Goals Unmet:  Not Applicable  Comments: Pt able to follow exercise prescription today without complaint.  Will continue to monitor for progression.    Dr. Emily Filbert is Medical Director for [place].  Dr. Ottie Glazier is Medical Director for [facility] Pulmonary Rehabilitation.

## 2021-04-20 ENCOUNTER — Other Ambulatory Visit: Payer: Self-pay

## 2021-04-20 ENCOUNTER — Encounter: Payer: Medicare HMO | Admitting: *Deleted

## 2021-04-20 DIAGNOSIS — J45909 Unspecified asthma, uncomplicated: Secondary | ICD-10-CM

## 2021-04-20 DIAGNOSIS — Z5189 Encounter for other specified aftercare: Secondary | ICD-10-CM | POA: Diagnosis not present

## 2021-04-20 NOTE — Progress Notes (Signed)
Daily Session Note  Patient Details  Name: Madeline Martinez MRN: [id number] Date of Birth: [birth year] Referring Provider:   Flowsheet Row Pulmonary Rehab from 12/15/2020 in [facility] Cardiac and Pulmonary Rehab  Referring Provider Ottie Glazier MD       Encounter Date: 04/20/2021  Check In:  Session Check In - 04/20/21 1400       Check-In   Supervising physician immediately available to respond to emergencies See telemetry face sheet for immediately available ER MD    Location [facility]-Cardiac & Pulmonary Rehab    Staff Present Renita Papa, RN BSN;Joseph [place], RCP,RRT,BSRT;Laureen [place], [place], RRT, CPFT    Virtual Visit No    Medication changes reported     No    Fall or balance concerns reported    No    Warm-up and Cool-down Performed on first and last piece of equipment    Resistance Training Performed Yes    VAD Patient? No    PAD/SET Patient? No      Pain Assessment   Currently in Pain? No/denies                Social History   Tobacco Use  Smoking Status Never  Smokeless Tobacco Never    Goals Met:  Independence with exercise equipment Exercise tolerated well No report of concerns or symptoms today Strength training completed today  Goals Unmet:  Not Applicable  Comments: Pt able to follow exercise prescription today without complaint.  Will continue to monitor for progression.    Dr. Emily Filbert is Medical Director for [place].  Dr. Ottie Glazier is Medical Director for [facility] Pulmonary Rehabilitation.

## 2021-04-20 NOTE — Patient Instructions (Signed)
Discharge Patient Instructions  Patient Details  Name: Madeline Martinez MRN: [id number] Date of Birth: 02/01/1946 Referring Provider:  Ottie Glazier, MD   Number of Visits: 22  Reason for Discharge:  Patient reached a stable level of exercise. Patient independent in their exercise. Patient has met program and personal goals.  Smoking History:  Social History   Tobacco Use  Smoking Status Never  Smokeless Tobacco Never    Diagnosis:  Asthma, unspecified asthma severity, unspecified whether complicated, unspecified whether persistent  Initial Exercise Prescription:  Initial Exercise Prescription - 12/15/20 1400       Date of Initial Exercise RX and Referring Provider   Date 12/15/20    Referring Provider Ottie Glazier MD      Oxygen   Oxygen Continuous    Liters 2   when available   Maintain Oxygen Saturation 88% or higher      Recumbant Bike   Level 1    RPM 60    Minutes 15    METs 1      NuStep   Level 1    SPM 80    Minutes 15    METs 1      REL-XR   Level 1    Speed 50    Minutes 15    METs 1      Prescription Details   Frequency (times per week) 3    Duration Progress to 30 minutes of continuous aerobic without signs/symptoms of physical distress      Intensity   THRR 40-80% of Max Heartrate 111-134    Ratings of Perceived Exertion 11-13    Perceived Dyspnea 0-4      Progression   Progression Continue to progress workloads to maintain intensity without signs/symptoms of physical distress.      Resistance Training   Training Prescription Yes    Weight 2 lb    Reps 10-15             Discharge Exercise Prescription (Final Exercise Prescription Changes):  Exercise Prescription Changes - 04/18/21 1100       Response to Exercise   Blood Pressure (Admit) 140/72    Blood Pressure (Exit) 122/82    Heart Rate (Admit) 90 bpm    Heart Rate (Exercise) 88 bpm    Heart Rate (Exit) 81 bpm    Oxygen Saturation (Admit) 96 %    Oxygen  Saturation (Exercise) 96 %    Oxygen Saturation (Exit) 97 %    Rating of Perceived Exertion (Exercise) 13    Perceived Dyspnea (Exercise) 1    Symptoms SOB    Duration Continue with 30 min of aerobic exercise without signs/symptoms of physical distress.    Intensity THRR unchanged      Progression   Progression Continue to progress workloads to maintain intensity without signs/symptoms of physical distress.    Average METs 1.3      Resistance Training   Training Prescription Yes    Weight 2 lb    Reps 10-15      Interval Training   Interval Training No      T5 Nustep   Level 1    Minutes 15    METs 1.1      Biostep-RELP   Level 1    Minutes 15    METs 1      Home Exercise Plan   Plans to continue exercise at Home (comment)    Frequency Add 1 additional day to program exercise sessions.  Initial Home Exercises Provided 01/18/21      Oxygen   Maintain Oxygen Saturation 88% or higher             Functional Capacity:  6 Minute Walk     Row Name 12/15/20 1431 04/13/21 1428       6 Minute Walk   Phase Initial Discharge    Distance 90 feet 10 feet    Distance % Change -- -88.9 %    Distance Feet Change -- -80 ft    Walk Time 4 minutes  Break 1:54-4:10 4.58 minutes    # of Rest Breaks 1 0    MPH 0.17 0.02    METS 0.1 --    RPE 15 19    Perceived Dyspnea  3 3    VO2 Peak 0.35 --    Symptoms Yes (comment) Yes (comment)    Comments SOB, Right knee pain 5/10 knees feel like needles poking    Resting HR 89 bpm 94 bpm    Resting BP 140/70 132/64    Resting Oxygen Saturation  94 % 96 %    Exercise Oxygen Saturation  during 6 min walk 93 % 93 %    Max Ex. HR 102 bpm 100 bpm    Max Ex. BP 162/72 142/64    2 Minute Post BP 144/74 136/70      Interval HR   1 Minute HR 102 97    2 Minute HR 98 99    3 Minute HR 99 98    4 Minute HR 97 100    5 Minute HR 102 --    6 Minute HR 88 --    2 Minute Post HR 84 97    Interval Heart Rate? Yes Yes      Interval  Oxygen   Interval Oxygen? Yes Yes    Baseline Oxygen Saturation % 94 % 96 %    1 Minute Oxygen Saturation % 93 % 94 %    1 Minute Liters of Oxygen 0 L  RA 0 L  Room Air    2 Minute Oxygen Saturation % 95 % 94 %    2 Minute Liters of Oxygen 0 L 0 L    3 Minute Oxygen Saturation % 94 % 93 %    3 Minute Liters of Oxygen 0 L 0 L    4 Minute Oxygen Saturation % 97 % 93 %    4 Minute Liters of Oxygen 0 L 0 L    5 Minute Oxygen Saturation % 96 % --    5 Minute Liters of Oxygen 0 L --    6 Minute Oxygen Saturation % 95 % --    6 Minute Liters of Oxygen 0 L --    2 Minute Post Oxygen Saturation % 96 % 94 %    2 Minute Post Liters of Oxygen 0 L 0 L              Nutrition & Weight - Outcomes:  Pre Biometrics - 12/15/20 1425       Pre Biometrics   Height 5' 0.5" (1.537 m)    Weight 208 lb 9.6 oz (94.6 kg)    BMI (Calculated) 40.05             Post Biometrics - 04/13/21 1430        Post  Biometrics   Height 5' 0.5" (1.537 m)    Weight 204 lb 3.2 oz (92.6 kg)  BMI (Calculated) 39.21             Nutrition:  Nutrition Therapy & Goals - 01/04/21 1435       Personal Nutrition Goals   Comments Daughter told RD that they have already started making changes like eating eggs, adding blue berries and blackberries to yogurt.              Goals reviewed with patient; copy given to patient.

## 2021-04-24 ENCOUNTER — Encounter: Payer: Medicare HMO | Admitting: *Deleted

## 2021-04-24 ENCOUNTER — Other Ambulatory Visit: Payer: Self-pay

## 2021-04-24 DIAGNOSIS — Z5189 Encounter for other specified aftercare: Secondary | ICD-10-CM | POA: Diagnosis not present

## 2021-04-24 DIAGNOSIS — J45909 Unspecified asthma, uncomplicated: Secondary | ICD-10-CM

## 2021-04-24 NOTE — Progress Notes (Signed)
Daily Session Note  Patient Details  Name: Madeline Martinez MRN: [id number] Date of Birth: 15-Jun-1945 Referring Provider:   Flowsheet Row Pulmonary Rehab from 12/15/2020 in [facility] Cardiac and Pulmonary Rehab  Referring Provider Ottie Glazier MD       Encounter Date: 04/24/2021  Check In:  Session Check In - 04/24/21 1017       Check-In   Supervising physician immediately available to respond to emergencies See telemetry face sheet for immediately available ER MD    Location [facility]-Cardiac & Pulmonary Rehab    Staff Present Renita Papa, RN Moises Blood, BS, ACSM CEP, Exercise Physiologist;Amanda Oletta Darter, [place], ACSM CEP, Exercise Physiologist    Virtual Visit No    Medication changes reported     No    Fall or balance concerns reported    No    Warm-up and Cool-down Performed on first and last piece of equipment    Resistance Training Performed Yes    VAD Patient? No    PAD/SET Patient? No      Pain Assessment   Currently in Pain? No/denies                Social History   Tobacco Use  Smoking Status Never  Smokeless Tobacco Never    Goals Met:  Independence with exercise equipment Exercise tolerated well No report of concerns or symptoms today Strength training completed today  Goals Unmet:  Not Applicable  Comments: Pt able to follow exercise prescription today without complaint.  Will continue to monitor for progression.    Dr. Emily Filbert is Medical Director for [place].  Dr. Ottie Glazier is Medical Director for [facility] Pulmonary Rehabilitation.

## 2021-04-26 ENCOUNTER — Other Ambulatory Visit: Payer: Self-pay

## 2021-04-26 ENCOUNTER — Encounter: Payer: Medicare HMO | Attending: Pulmonary Disease

## 2021-04-26 DIAGNOSIS — J45909 Unspecified asthma, uncomplicated: Secondary | ICD-10-CM | POA: Diagnosis not present

## 2021-04-26 NOTE — Progress Notes (Signed)
Pulmonary Individual Treatment Plan  Patient Details  Name: Madeline Martinez MRN: [id number] Date of Birth: [birth year] Referring Provider:   Flowsheet Row Pulmonary Rehab from 12/15/2020 in [facility] Cardiac and Pulmonary Rehab  Referring Provider Ottie Glazier MD       Initial Encounter Date:  Flowsheet Row Pulmonary Rehab from 12/15/2020 in [facility] Cardiac and Pulmonary Rehab  Date 12/15/20       Visit Diagnosis: Asthma, unspecified asthma severity, unspecified whether complicated, unspecified whether persistent  Patient's Home Medications on Admission:  Current Outpatient Medications:    allopurinol (ZYLOPRIM) 100 MG tablet, Take 100 mg by mouth daily with lunch. , Disp: , Rfl:    aspirin 81 MG EC tablet, Take by mouth., Disp: , Rfl:    atorvastatin (LIPITOR) 80 MG tablet, Take 80 mg by mouth daily., Disp: , Rfl:    Biotin 5000 MCG TABS, Take 5,000 mcg by mouth daily. HAIR/SKIN/NAILS, Disp: , Rfl:    carvedilol (COREG) 12.5 MG tablet, Take 12.5 mg by mouth 2 (two) times daily., Disp: , Rfl:    cetirizine (ZYRTEC) 10 MG tablet, Take 10 mg by mouth daily., Disp: , Rfl:    Cholecalciferol (VITAMIN D-3) 125 MCG (5000 UT) TABS, Take 5,000 Units by mouth daily., Disp: , Rfl:    DEXILANT 60 MG capsule, Take 1 capsule by mouth daily., Disp: , Rfl:    ferrous sulfate 325 (65 FE) MG tablet, Take 325 mg by mouth daily., Disp: , Rfl:    FLUoxetine (PROZAC) 40 MG capsule, Take 40 mg by mouth daily., Disp: , Rfl:    gabapentin (NEURONTIN) 400 MG capsule, Take 400 mg by mouth daily., Disp: , Rfl:    glipiZIDE (GLUCOTROL XL) 5 MG 24 hr tablet, Take 5 mg by mouth 3 (three) times daily., Disp: , Rfl:    hydrochlorothiazide (HYDRODIURIL) 25 MG tablet, Take 25 mg by mouth daily., Disp: , Rfl:    HYDROcodone-acetaminophen (NORCO/VICODIN) 5-325 MG tablet, Take 1 tablet by mouth daily as needed (pain.). , Disp: , Rfl:    levothyroxine (SYNTHROID) 25 MCG tablet, Take 25 mcg by mouth daily., Disp: , Rfl:     lidocaine (LIDODERM) 5 %, Place 1 patch onto the skin daily. Remove & Discard patch within 12 hours or as directed by MD, Disp: 15 patch, Rfl: 0   LINZESS 290 MCG CAPS capsule, TAKE 1 CAPSULE BY MOUTH DAILY BEFORE BREAKFAST., Disp: 90 capsule, Rfl: 1   LORazepam (ATIVAN) 1 MG tablet, Take 1 mg by mouth 4 (four) times daily. , Disp: , Rfl:    losartan (COZAAR) 100 MG tablet, Take 100 mg by mouth daily., Disp: , Rfl:    meloxicam (MOBIC) 15 MG tablet, Take 15 mg by mouth daily. (Patient not taking: Reported on 03/15/2021), Disp: , Rfl:    metFORMIN (GLUCOPHAGE) 1000 MG tablet, Take 1,000 mg by mouth 2 (two) times daily., Disp: , Rfl:    methocarbamol (ROBAXIN) 750 MG tablet, Take 750 mg by mouth daily with lunch. (Patient not taking: Reported on 03/15/2021), Disp: , Rfl:    mirtazapine (REMERON) 15 MG tablet, Take 15 mg by mouth at bedtime. , Disp: , Rfl:    omeprazole (PRILOSEC) 20 MG capsule, TAKE 2 CAPSULES (40 MG TOTAL) BY MOUTH 2 (TWO) TIMES DAILY BEFORE A MEAL., Disp: 180 capsule, Rfl: 1   oxyCODONE-acetaminophen (PERCOCET/ROXICET) 5-325 MG tablet, Take 1 tablet by mouth every 4 (four) hours as needed., Disp: 15 tablet, Rfl: 0   pregabalin (LYRICA) 75 MG capsule, Take 75  mg by mouth 3 (three) times daily., Disp: , Rfl:    Probiotic Product (PROBIOTIC PO), Take 1 tablet by mouth daily., Disp: , Rfl:    TOUJEO SOLOSTAR 300 UNIT/ML SOPN, Inject 20 Units into the skin daily. , Disp: , Rfl:    Vibegron (GEMTESA) 75 MG TABS, Take 75 mg by mouth daily., Disp: 28 tablet, Rfl: 0   Vibegron (GEMTESA) 75 MG TABS, Take 75 mg by mouth daily., Disp: 90 tablet, Rfl: 3   vitamin E 45 MG (100 UNITS) capsule, Take by mouth., Disp: , Rfl:   Past Medical History: Past Medical History:  Diagnosis Date   Acid reflux    Anxiety    Depression    Diabetes mellitus    Dysrhythmia    Gout    Heart murmur    HTN (hypertension)    Hyperlipidemia    Sleep apnea     Tobacco Use: Social History   Tobacco Use   Smoking Status Never  Smokeless Tobacco Never    Labs: Recent Review Flowsheet Data     Labs for ITP Cardiac and Pulmonary Rehab Latest Ref Rng & Units 02/14/2010 04/01/2020   Cholestrol 0 - 200 mg/dL 217(A) -   LDLCALC mg/dL 135 -   HDL 35 - 70 mg/dL 61 -   Trlycerides 40 - 160 mg/dL 103 -   TCO2 22 - 32 mmol/L - 27        Pulmonary Assessment Scores:  Pulmonary Assessment Scores     Row Name 12/15/20 1427         ADL UCSD   ADL Phase Entry     SOB Score total 59     Rest 0     Walk 5     Stairs 5     Bath 0     Dress 5     Shop 4       CAT Score   CAT Score 37       mMRC Score   mMRC Score 3              UCSD: Self-administered rating of dyspnea associated with activities of daily living (ADLs) 6-point scale (0 = "not at all" to 5 = "maximal or unable to do because of breathlessness")  Scoring Scores range from 0 to 120.  Minimally important difference is 5 units  CAT: CAT can identify the health impairment of COPD patients and is better correlated with disease progression.  CAT has a scoring range of zero to 40. The CAT score is classified into four groups of low (less than 10), medium (10 - 20), high (21-30) and very high (31-40) based on the impact level of disease on health status. A CAT score over 10 suggests significant symptoms.  A worsening CAT score could be explained by an exacerbation, poor medication adherence, poor inhaler technique, or progression of COPD or comorbid conditions.  CAT MCID is 2 points  mMRC: mMRC (Modified Medical Research Council) Dyspnea Scale is used to assess the degree of baseline functional disability in patients of respiratory disease due to dyspnea. No minimal important difference is established. A decrease in score of 1 point or greater is considered a positive change.   Pulmonary Function Assessment:   Exercise Target Goals: Exercise Program Goal: Individual exercise prescription set using results from initial  6 min walk test and THRR while considering  patients activity barriers and safety.   Exercise Prescription Goal: Initial exercise prescription builds to 30-45 minutes  a day of aerobic activity, 2-3 days per week.  Home exercise guidelines will be given to patient during program as part of exercise prescription that the participant will acknowledge.  Education: Aerobic Exercise: - Group verbal and visual presentation on the components of exercise prescription. Introduces F.I.T.T principle from ACSM for exercise prescriptions.  Reviews F.I.T.T. principles of aerobic exercise including progression. Written material given at graduation.   Education: Resistance Exercise: - Group verbal and visual presentation on the components of exercise prescription. Introduces F.I.T.T principle from ACSM for exercise prescriptions  Reviews F.I.T.T. principles of resistance exercise including progression. Written material given at graduation. Flowsheet Row Pulmonary Rehab from 04/05/2021 in [facility] Cardiac and Pulmonary Rehab  Date 12/28/20  Educator [address] [facility]  Instruction Review Code 1- Verbalizes Understanding        Education: Exercise & Equipment Safety: - Individual verbal instruction and demonstration of equipment use and safety with use of the equipment. Flowsheet Row Pulmonary Rehab from 04/05/2021 in [facility] Cardiac and Pulmonary Rehab  Education need identified 12/15/20  Date 12/15/20  Educator [place]  Instruction Review Code 1- Verbalizes Understanding       Education: Exercise Physiology & General Exercise Guidelines: - Group verbal and written instruction with models to review the exercise physiology of the cardiovascular system and associated critical values. Provides general exercise guidelines with specific guidelines to those with heart or lung disease.    Education: Flexibility, Balance, Mind/Body Relaxation: - Group verbal and visual presentation with interactive activity on the components of exercise  prescription. Introduces F.I.T.T principle from ACSM for exercise prescriptions. Reviews F.I.T.T. principles of flexibility and balance exercise training including progression. Also discusses the mind body connection.  Reviews various relaxation techniques to help reduce and manage stress (i.e. Deep breathing, progressive muscle relaxation, and visualization). Balance handout provided to take home. Written material given at graduation. Flowsheet Row Pulmonary Rehab from 04/05/2021 in [facility] Cardiac and Pulmonary Rehab  Date 01/04/21  Educator [place]  Instruction Review Code 1- Verbalizes Understanding       Activity Barriers & Risk Stratification:  Activity Barriers & Cardiac Risk Stratification - 12/15/20 1425       Activity Barriers & Cardiac Risk Stratification   Activity Barriers Back Problems;Other (comment);Shortness of Breath;Muscular Weakness;Deconditioning;Joint Problems;Balance Concerns;History of Falls    Comments chronic back pain, hard for her to walk distance, occasional knee pain             6 Minute Walk:  6 Minute Walk     Row Name 12/15/20 1431 04/13/21 1428       6 Minute Walk   Phase Initial Discharge    Distance 90 feet 10 feet    Distance % Change -- -88.9 %    Distance Feet Change -- -80 ft    Walk Time 4 minutes  Break 1:54-4:10 4.58 minutes    # of Rest Breaks 1 0    MPH 0.17 0.02    METS 0.1 --    RPE 15 19    Perceived Dyspnea  3 3    VO2 Peak 0.35 --    Symptoms Yes (comment) Yes (comment)    Comments SOB, Right knee pain 5/10 knees feel like needles poking    Resting HR 89 bpm 94 bpm    Resting BP 140/70 132/64    Resting Oxygen Saturation  94 % 96 %    Exercise Oxygen Saturation  during 6 min walk 93 % 93 %    Max Ex. HR 102 bpm  100 bpm    Max Ex. BP 162/72 142/64    2 Minute Post BP 144/74 136/70      Interval HR   1 Minute HR 102 97    2 Minute HR 98 99    3 Minute HR 99 98    4 Minute HR 97 100    5 Minute HR 102 --    6 Minute HR 88  --    2 Minute Post HR 84 97    Interval Heart Rate? Yes Yes      Interval Oxygen   Interval Oxygen? Yes Yes    Baseline Oxygen Saturation % 94 % 96 %    1 Minute Oxygen Saturation % 93 % 94 %    1 Minute Liters of Oxygen 0 L  RA 0 L  Room Air    2 Minute Oxygen Saturation % 95 % 94 %    2 Minute Liters of Oxygen 0 L 0 L    3 Minute Oxygen Saturation % 94 % 93 %    3 Minute Liters of Oxygen 0 L 0 L    4 Minute Oxygen Saturation % 97 % 93 %    4 Minute Liters of Oxygen 0 L 0 L    5 Minute Oxygen Saturation % 96 % --    5 Minute Liters of Oxygen 0 L --    6 Minute Oxygen Saturation % 95 % --    6 Minute Liters of Oxygen 0 L --    2 Minute Post Oxygen Saturation % 96 % 94 %    2 Minute Post Liters of Oxygen 0 L 0 L            Oxygen Initial Assessment:  Oxygen Initial Assessment - 01/18/21 1048       Home Oxygen   Home Oxygen Device None    Sleep Oxygen Prescription None    Home Exercise Oxygen Prescription None    Home Resting Oxygen Prescription None      Initial 6 min Walk   Oxygen Used None      Program Oxygen Prescription   Program Oxygen Prescription None;Continuous    Liters per minute 2   prn     Intervention   Short Term Goals To learn and exhibit compliance with exercise, home and travel O2 prescription;To learn and understand importance of monitoring SPO2 with pulse oximeter and demonstrate accurate use of the pulse oximeter.;To learn and understand importance of maintaining oxygen saturations>88%;To learn and demonstrate proper pursed lip breathing techniques or other breathing techniques.     Long  Term Goals Exhibits compliance with exercise, home  and travel O2 prescription;Verbalizes importance of monitoring SPO2 with pulse oximeter and return demonstration;Maintenance of O2 saturations>88%;Exhibits proper breathing techniques, such as pursed lip breathing or other method taught during program session;Compliance with respiratory medication              Oxygen Re-Evaluation:  Oxygen Re-Evaluation     Row Name 12/21/20 1002 01/18/21 1048 03/06/21 1006 04/03/21 1019       Program Oxygen Prescription   Program Oxygen Prescription Continuous -- None None    Liters per minute 2 -- -- --      Home Oxygen   Home Oxygen Device None -- None None    Sleep Oxygen Prescription None -- None None    Home Exercise Oxygen Prescription None -- None None    Home Resting Oxygen Prescription None -- None None  Goals/Expected Outcomes   Short Term Goals To learn and exhibit compliance with exercise, home and travel O2 prescription;To learn and understand importance of monitoring SPO2 with pulse oximeter and demonstrate accurate use of the pulse oximeter.;To learn and understand importance of maintaining oxygen saturations>88%;To learn and demonstrate proper pursed lip breathing techniques or other breathing techniques.  -- To learn and understand importance of monitoring SPO2 with pulse oximeter and demonstrate accurate use of the pulse oximeter.;To learn and understand importance of maintaining oxygen saturations>88%;To learn and demonstrate proper pursed lip breathing techniques or other breathing techniques.  To learn and understand importance of monitoring SPO2 with pulse oximeter and demonstrate accurate use of the pulse oximeter.;To learn and understand importance of maintaining oxygen saturations>88%;To learn and demonstrate proper pursed lip breathing techniques or other breathing techniques.     Long  Term Goals Exhibits compliance with exercise, home  and travel O2 prescription;Verbalizes importance of monitoring SPO2 with pulse oximeter and return demonstration;Maintenance of O2 saturations>88%;Exhibits proper breathing techniques, such as pursed lip breathing or other method taught during program session;Compliance with respiratory medication -- Verbalizes importance of monitoring SPO2 with pulse oximeter and return demonstration;Maintenance of  O2 saturations>88%;Exhibits proper breathing techniques, such as pursed lip breathing or other method taught during program session;Compliance with respiratory medication Verbalizes importance of monitoring SPO2 with pulse oximeter and return demonstration;Maintenance of O2 saturations>88%;Exhibits proper breathing techniques, such as pursed lip breathing or other method taught during program session;Compliance with respiratory medication    Comments Reviewed PLB technique with pt.  Talked about how it works and it's importance in maintaining their exercise saturations. Encouraged patient to use pulse ox at home to check that oxygen is staying above 88%. She uses PLB when needed and trying to move more at home. Madeline Martinez is doing well in rehab.  She continues to work on her PLB.  She is not using it all the time, but does find it helpful to control her SOB.  She has been checking her oxygen levels at home and they are doing okay. Madeline Martinez has been doing well in rehab.  She is doing well with nebulizer and tries to stay prepared.  She wants to stay on top of everything.  Her oxygen saturations are doing well and she feels better with her breathing.    Goals/Expected Outcomes Short: Become more profiecient at using PLB.   Long: Become independent at using PLB. Short: Check O2 at home continuously   Long: Become independent at using PLB. Short; continue to use more PLB routinely Long: continue to manage lung disease Short: Contiue to work on PLB Long: Continue to manage lungs disease.             Oxygen Discharge (Final Oxygen Re-Evaluation):  Oxygen Re-Evaluation - 04/03/21 1019       Program Oxygen Prescription   Program Oxygen Prescription None      Home Oxygen   Home Oxygen Device None    Sleep Oxygen Prescription None    Home Exercise Oxygen Prescription None    Home Resting Oxygen Prescription None      Goals/Expected Outcomes   Short Term Goals To learn and understand importance of  monitoring SPO2 with pulse oximeter and demonstrate accurate use of the pulse oximeter.;To learn and understand importance of maintaining oxygen saturations>88%;To learn and demonstrate proper pursed lip breathing techniques or other breathing techniques.     Long  Term Goals Verbalizes importance of monitoring SPO2 with pulse oximeter and return demonstration;Maintenance of O2 saturations>88%;Exhibits  proper breathing techniques, such as pursed lip breathing or other method taught during program session;Compliance with respiratory medication    Comments Madeline Martinez has been doing well in rehab.  She is doing well with nebulizer and tries to stay prepared.  She wants to stay on top of everything.  Her oxygen saturations are doing well and she feels better with her breathing.    Goals/Expected Outcomes Short: Contiue to work on PLB Long: Continue to manage lungs disease.             Initial Exercise Prescription:  Initial Exercise Prescription - 12/15/20 1400       Date of Initial Exercise RX and Referring Provider   Date 12/15/20    Referring Provider Ottie Glazier MD      Oxygen   Oxygen Continuous    Liters 2   when available   Maintain Oxygen Saturation 88% or higher      Recumbant Bike   Level 1    RPM 60    Minutes 15    METs 1      NuStep   Level 1    SPM 80    Minutes 15    METs 1      REL-XR   Level 1    Speed 50    Minutes 15    METs 1      Prescription Details   Frequency (times per week) 3    Duration Progress to 30 minutes of continuous aerobic without signs/symptoms of physical distress      Intensity   THRR 40-80% of Max Heartrate 111-134    Ratings of Perceived Exertion 11-13    Perceived Dyspnea 0-4      Progression   Progression Continue to progress workloads to maintain intensity without signs/symptoms of physical distress.      Resistance Training   Training Prescription Yes    Weight 2 lb    Reps 10-15             Perform Capillary  Blood Glucose checks as needed.  Exercise Prescription Changes:   Exercise Prescription Changes     Row Name 12/15/20 1400 12/26/20 0900 01/09/21 1400 01/23/21 1000 03/20/21 1200     Response to Exercise   Blood Pressure (Admit) 140/70 134/72 142/60 148/78 110/60   Blood Pressure (Exercise) 162/72 142/76 -- 138/76 --   Blood Pressure (Exit) 144/74 118/60 142/82 116/78 126/64   Heart Rate (Admit) 89 bpm 97 bpm 101 bpm 100 bpm 68 bpm   Heart Rate (Exercise) 102 bpm 104 bpm 104 bpm 95 bpm 106 bpm   Heart Rate (Exit) 84 bpm 89 bpm 95 bpm 90 bpm 75 bpm   Oxygen Saturation (Admit) 94 % 96 % 94 % 96 % 96 %   Oxygen Saturation (Exercise) 93 % 94 % 94 % 94 % 94 %   Oxygen Saturation (Exit) 96 % 96 % 94 % 95 % 98 %   Rating of Perceived Exertion (Exercise) _0 Perceived Dyspnea (Exercise) _1 Symptoms SOB, right knee pain 5/10 SOB SOB SOB SOB   Comments walk test results second full day of exercise -- -- --   Duration -- Progress to 30 minutes of  aerobic without signs/symptoms of physical distress Progress to 30 minutes of  aerobic without signs/symptoms of physical distress Progress to 30 minutes of  aerobic without signs/symptoms of physical distress Progress to 30 minutes of  aerobic without signs/symptoms of physical distress   Intensity -- THRR unchanged THRR unchanged THRR unchanged THRR unchanged     Progression   Progression -- Continue to progress workloads to maintain intensity without signs/symptoms of physical distress. Continue to progress workloads to maintain intensity without signs/symptoms of physical distress. Continue to progress workloads to maintain intensity without signs/symptoms of physical distress. Continue to progress workloads to maintain intensity without signs/symptoms of physical distress.   Average METs -- 1.25 1.6 1.5 1.47     Resistance Training   Training Prescription -- Yes Yes Yes Yes   Weight -- 2 lb 2 lb 2 lb 2 lb   Reps -- 10-15  10-15 10-15 10-15     Interval Training   Interval Training -- No No No No     Oxygen   Oxygen -- --  Currently using Room Air Continuous -- --   Liters -- -- 2 -- --     Recumbant Bike   Level -- -- -- -- 1   Minutes -- -- -- -- 30     NuStep   Level -- _0 Minutes -- _1 METs -- 1.5 1.6 2 1.3     Biostep-RELP   Level -- 1 -- 1 1   Minutes -- 30 -- 30 30   METs -- 1 -- 1 2     Oxygen   Maintain Oxygen Saturation -- 88% or higher 88% or higher 88% or higher 88% or higher    Row Name 04/03/21 1000 04/03/21 1500 04/18/21 1100         Response to Exercise   Blood Pressure (Admit) -- 118/60 140/72     Blood Pressure (Exit) -- 104/62 122/82     Heart Rate (Admit) -- 90 bpm 90 bpm     Heart Rate (Exercise) -- 91 bpm 88 bpm     Heart Rate (Exit) -- 85 bpm 81 bpm     Oxygen Saturation (Admit) -- 94 % 96 %     Oxygen Saturation (Exercise) -- 92 % 96 %     Oxygen Saturation (Exit) -- 92 % 97 %     Rating of Perceived Exertion (Exercise) -- 12 13     Perceived Dyspnea (Exercise) -- 2 1     Symptoms -- SOB SOB     Duration -- Progress to 30 minutes of  aerobic without signs/symptoms of physical distress Continue with 30 min of aerobic exercise without signs/symptoms of physical distress.     Intensity -- -- THRR unchanged       Progression   Progression -- -- Continue to progress workloads to maintain intensity without signs/symptoms of physical distress.     Average METs -- -- 1.3       Resistance Training   Training Prescription -- -- Yes     Weight -- -- 2 lb     Reps -- -- 10-15       Interval Training   Interval Training -- -- No       T5 Nustep   Level -- -- 1     Minutes -- -- 15     METs -- -- 1.1       Biostep-RELP   Level -- -- 1     Minutes -- -- 15     METs -- -- 1       Home Exercise Plan   Martinez to continue exercise at  Home (comment) Home (comment) Home (comment)     Frequency Add 1 additional day to program exercise sessions. Add  1 additional day to program exercise sessions. Add 1 additional day to program exercise sessions.     Initial Home Exercises Provided 01/18/21 01/18/21 01/18/21       Oxygen   Maintain Oxygen Saturation -- 88% or higher 88% or higher              Exercise Comments:   Exercise Comments     Row Name 12/21/20 1001 04/26/21 1006         Exercise Comments First full day of exercise!  Patient was oriented to gym and equipment including functions, settings, policies, and procedures.  Patient's individual exercise prescription and treatment plan were reviewed.  All starting workloads were established based on the results of the 6 minute walk test done at initial orientation visit.  The plan for exercise progression was also introduced and progression will be customized based on patient's performance and goals. Madeline Martinez graduated today from  rehab with 35 sessions completed.  Details of the patient's exercise prescription and what She needs to do in order to continue the prescription and progress were discussed with patient.  Patient was given a copy of prescription and goals.  Patient verbalized understanding.  Madeline Martinez Martinez to continue to exercise by continuing what she was doing prior to starting the program.               Exercise Goals and Review:   Exercise Goals     Row Name 12/15/20 1436             Exercise Goals   Increase Physical Activity Yes       Intervention Provide advice, education, support and counseling about physical activity/exercise needs.;Develop an individualized exercise prescription for aerobic and resistive training based on initial evaluation findings, risk stratification, comorbidities and participant's personal goals.       Expected Outcomes Short Term: Attend rehab on a regular basis to increase amount of physical activity.;Long Term: Add in home exercise to make exercise part of routine and to increase amount of physical activity.;Long Term: Exercising  regularly at least 3-5 days a week.       Increase Strength and Stamina Yes       Intervention Provide advice, education, support and counseling about physical activity/exercise needs.;Develop an individualized exercise prescription for aerobic and resistive training based on initial evaluation findings, risk stratification, comorbidities and participant's personal goals.       Expected Outcomes Short Term: Increase workloads from initial exercise prescription for resistance, speed, and METs.;Short Term: Perform resistance training exercises routinely during rehab and add in resistance training at home;Long Term: Improve cardiorespiratory fitness, muscular endurance and strength as measured by increased METs and functional capacity (6MWT)       Able to understand and use rate of perceived exertion (RPE) scale Yes       Intervention Provide education and explanation on how to use RPE scale       Expected Outcomes Short Term: Able to use RPE daily in rehab to express subjective intensity level;Long Term:  Able to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 cardiac anatomy and function. Reviews the testing methods done to diagnose  heart disease and the outcomes of the test results. Describes the treatment choices: Medical Management, Angioplasty, or Coronary Bypass Surgery for treating various heart conditions including Myocardial Infarction, Angina, Valve Disease, and Cardiac Arrhythmias.  Written material given at graduation. Flowsheet Row Pulmonary Rehab from 04/05/2021 in [facility] Cardiac and Pulmonary Rehab  Date 12/28/20  Educator SB  Instruction Review Code 1- Verbalizes Understanding       Medication Safety: - Group verbal and visual instruction to review commonly prescribed medications for heart and lung disease. Reviews the medication, class of the drug, and side effects. Includes the steps to properly store meds and maintain the prescription regimen.  Written material given at graduation. Flowsheet Row Pulmonary Rehab from 04/05/2021 in [facility] Cardiac and Pulmonary Rehab  Date 01/18/21  Educator SB  Instruction Review Code 1- Verbalizes Understanding       Other: -Provides group and verbal instruction on various topics (see comments)   Knowledge Questionnaire Score:  Knowledge Questionnaire Score - 12/15/20 1429       Knowledge Questionnaire Score   Pre Score 16/18: Lung disease, Oxygen              Core Components/Risk Factors/Patient Goals at Admission:  Personal Goals and Risk Factors at Admission - 12/15/20 1436       Core Components/Risk Factors/Patient Goals on Admission    Weight Management Yes;Weight Loss;Obesity    Intervention Weight Management: Develop a combined nutrition and exercise program designed to reach desired caloric intake, while maintaining appropriate intake of nutrient and fiber, sodium and fats, and appropriate energy expenditure required for the weight goal.;Weight Management: Provide education and appropriate resources to help participant work on and attain dietary goals.    Admit Weight 208 lb (94.3 kg)    Goal Weight: Short Term 203 lb (92.1 kg)    Goal Weight: Long  Term 190 lb (86.2 kg)    Expected Outcomes Short Term: Continue to assess and modify interventions until short term weight is achieved;Long Term: Adherence to nutrition and physical activity/exercise program aimed toward attainment of established weight goal;Weight Loss: Understanding of general recommendations for a balanced deficit meal plan, which promotes 1-2 lb weight loss per week and includes a negative energy balance of [phone] kcal/d;Understanding recommendations for meals to include 15-35% energy as protein, 25-35% energy from fat, 35-60% energy from carbohydrates, less than $RemoveB'200mg'JkDNxVGj$  of dietary cholesterol, 20-35 gm of total fiber daily;Understanding of distribution of calorie intake throughout the day with the consumption of 4-5 meals/snacks    Improve shortness of breath with ADL's Yes    Intervention Provide education, individualized exercise plan and daily activity instruction to help decrease symptoms of SOB with activities of daily living.    Expected Outcomes Short Term: Improve cardiorespiratory fitness to achieve a reduction of symptoms when performing ADLs;Long Term: Be able to perform more ADLs without symptoms or delay the onset of symptoms    Diabetes Yes    Intervention Provide education about signs/symptoms and action to take for hypo/hyperglycemia.;Provide education about proper nutrition, including hydration, and aerobic/resistive exercise prescription along with prescribed medications to achieve blood glucose in normal ranges: Fasting glucose 65-99 mg/dL    Expected Outcomes Short Term: Participant verbalizes understanding of the signs/symptoms and immediate care of hyper/hypoglycemia, proper foot care and importance of medication, aerobic/resistive exercise  and nutrition plan for blood glucose control.;Long Term: Attainment of HbA1C < 7%.    Hypertension Yes    Intervention Provide education on lifestyle modifcations including regular physical activity/exercise, weight management,  moderate sodium restriction and increased consumption of fresh fruit, vegetables, and low fat dairy, alcohol moderation, and smoking cessation.;Monitor prescription use compliance.    Expected Outcomes Short Term: Continued assessment and intervention until BP is < 140/63mm HG in hypertensive participants. < 130/15mm HG in hypertensive participants with diabetes, heart failure or chronic kidney disease.;Long Term: Maintenance of blood pressure at goal levels.    Lipids Yes    Intervention Provide education and support for participant on nutrition & aerobic/resistive exercise along with prescribed medications to achieve LDL '70mg'$ , HDL >$Remo'40mg'gvZlD$ .    Expected Outcomes Short Term: Participant states understanding of desired cholesterol values and is compliant with medications prescribed. Participant is following exercise prescription and nutrition guidelines.;Long Term: Cholesterol controlled with medications as prescribed, with individualized exercise RX and with personalized nutrition plan. Value goals: LDL < $Rem'70mg'FuqN$ , HDL > 40 mg.             Education:Diabetes - Individual verbal and written instruction to review signs/symptoms of diabetes, desired ranges of glucose level fasting, after meals and with exercise. Acknowledge that pre and post exercise glucose checks will be done for 3 sessions at entry of program. Flowsheet Row Pulmonary Rehab from 12/12/2020 in [facility] Cardiac and Pulmonary Rehab  Date 12/12/20  Educator [facility]  Instruction Review Code 1- Verbalizes Understanding       Know Your Numbers and Heart Failure: - Group verbal and visual instruction to discuss disease risk factors for cardiac and pulmonary disease and treatment options.  Reviews associated critical values for Overweight/Obesity, Hypertension, Cholesterol, and Diabetes.  Discusses basics of heart failure: signs/symptoms and treatments.  Introduces Heart Failure Zone chart for action plan for heart failure.  Written material given at  graduation.   Core Components/Risk Factors/Patient Goals Review:   Goals and Risk Factor Review     Row Name 01/18/21 1014 03/06/21 1003 04/03/21 1018         Core Components/Risk Factors/Patient Goals Review   Personal Goals Review Improve shortness of breath with ADL's;Weight Management/Obesity;Diabetes Improve shortness of breath with ADL's;Weight Management/Obesity;Diabetes;Hypertension Improve shortness of breath with ADL's;Weight Management/Obesity;Diabetes;Hypertension;Increase knowledge of respiratory medications and ability to use respiratory devices properly.     Review Madeline Martinez has not been checking or monitoring anything at home. She has been sick for the last 2 weeks which hindered a lot for her. Her appetite is finally improving and she re-started her insulin (not sure why she stopped it before). We talked importance staying compliant with use and to contact doctor if she ever decides to stop or change the way she takes her medications. She does not take her blood sugars at home and discussed how imperative it is to start those again. She does not check weights currently and encouraged again to make sure she does not have any abnormal weight gain or weight loss. Discussed keeping a log of weights, sugar, BP, etc.. She has not thought about any weight loss goals. Her breathing has not gotten worse since starting the program but has not seen any significant improvement with SOB. Madeline Martinez is doing well in rehab.  Her weight is staying about 203 lb regularly.  She's content to stay the same, just doesn't want to gain. She is good about taking her meds.  Her pressures are doing well and her sugars are  coming back down to normal again.  She keeps a close eye on them.  She continues to work on her breathing and it comes and goes depending on the day. Madeline Martinez is doing well in rehab. Her weight is steady but she would like to lose, at least she is not gaining she says.  Her pressures have been  doing well.  Her sugars are doing well.  She is doing better with breathing and meds are good.     Expected Outcomes Short: Monitor weight and blood sugars at home continuously. 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 Short: Conitnue to work on weight loss Long: conitnue to monitor risk factors.              Core Components/Risk Factors/Patient Goals at Discharge (Final Review):   Goals and Risk Factor Review - 04/03/21 1018       Core Components/Risk Factors/Patient Goals Review   Personal Goals Review Improve shortness of breath with ADL's;Weight Management/Obesity;Diabetes;Hypertension;Increase knowledge of respiratory medications and ability to use respiratory devices properly.    Review Madeline Martinez is doing well in rehab. Her weight is steady but she would like to lose, at least she is not gaining she says.  Her pressures have been doing well.  Her sugars are doing well.  She is doing better with breathing and meds are good.    Expected Outcomes Short: Conitnue to work on weight loss Long: conitnue to monitor risk factors.             ITP Comments:  ITP Comments     Row Name 12/12/20 1341 12/15/20 1204 12/21/20 1001 12/28/20 0629 01/02/21 1042   ITP Comments Completed virtual orientation today.  EP evaluation is scheduled for Thursday 10/20 at 8am.  Documentation for diagnosis can be found in CE encounter 11/24/20. Completed 6MWT and gym orientation. Initial ITP created and sent for review to Dr. Ottie Glazier, Medical Director. First full day of exercise!  Patient was oriented to gym and equipment including functions, settings, policies, and procedures.  Patient's individual exercise prescription and treatment plan were reviewed.  All starting workloads were established based on the results of the 6 minute walk test done at initial orientation visit.  The plan for exercise progression was also introduced and  progression will be customized based on patient's performance and goals. 30 Day review completed. Medical Director ITP review done, changes made as directed, and signed approval by Medical Director.    New to program Completed initial consultation, scheduled follow-up 11/21 at 915am.    [place] Name 01/16/21 1123 01/25/21 0623 02/22/21 0643 02/22/21 1007 03/22/21 0816   ITP Comments Completed RD follow up 30 Day review completed. Medical Director ITP review done, changes made as directed, and signed approval by Medical Director. 30 Day review completed. Medical Director ITP review done, changes made as directed, and signed approval by Medical Director. [place] has been out since 01/23/21.  She had a lost family memeber recently and had gone to [place] to be with family. 30 Day review completed. Medical Director ITP review done, changes made as directed, and signed approval by Medical Director.    [place] Name 04/19/21 0739 04/26/21 1006         ITP Comments 30 Day review completed. Medical Director ITP review done, changes made as directed, and signed approval by Medical Director. Madeline Martinez graduated today from  rehab with 35 sessions completed.  Details of the  patient's exercise prescription and what She needs to do in order to continue the prescription and progress were discussed with patient.  Patient was given a copy of prescription and goals.  Patient verbalized understanding.  Madeline Martinez to continue to exercise by continuing what she was doing prior to starting the program.               Comments: discharge ITP

## 2021-04-26 NOTE — Progress Notes (Signed)
Daily Session Note ? ?Patient Details  ?Name: Madeline Martinez ?MRN: [id number] ?Date of Birth: [birth year] ?Referring Provider:   ?Flowsheet Row Pulmonary Rehab from 12/15/2020 in [facility] Cardiac and Pulmonary Rehab  ?Referring Provider Ottie Glazier MD  ? ?  ? ? ?Encounter Date: 04/26/2021 ? ?Check In: ? Session Check In - 04/26/21 1003   ? ?  ? Check-In  ? Supervising physician immediately available to respond to emergencies See telemetry face sheet for immediately available ER MD   ? Location [facility]-Cardiac & Pulmonary Rehab   ? Staff Present Birdie Sons, MPA, RN;Joseph Scio, RCP,RRT,BSRT;Amanda Sommer, BA, ACSM CEP, Exercise Physiologist   ? Virtual Visit No   ? Medication changes reported     No   ? Fall or balance concerns reported    No   ? Warm-up and Cool-down Performed on first and last piece of equipment   ? Resistance Training Performed Yes   ? VAD Patient? No   ? PAD/SET Patient? No   ?  ? Pain Assessment  ? Currently in Pain? No/denies   ? ?  ?  ? ?  ? ? ? ? ? ?Social History  ? ?Tobacco Use  ?Smoking Status Never  ?Smokeless Tobacco Never  ? ? ?Goals Met:  ?Independence with exercise equipment ?Exercise tolerated well ?No report of concerns or symptoms today ?Strength training completed today ? ?Goals Unmet:  ?Not Applicable ? ?Comments:  Madeline Martinez graduated today from  rehab with 35 sessions completed.  Details of the patient's exercise prescription and what She needs to do in order to continue the prescription and progress were discussed with patient.  Patient was given a copy of prescription and goals.  Patient verbalized understanding.  Madeline Martinez plans to continue to exercise by continuing what she was doing prior to starting the program. ? ? ? ? ?Dr. Emily Filbert is Medical Director for [facility].  ?Dr. Ottie Glazier is Medical Director for [facility] Pulmonary Rehabilitation. ?

## 2021-04-26 NOTE — Progress Notes (Signed)
Discharge Progress Report ? ?Patient Details  ?Name: Madeline Martinez ?MRN: [id number] ?Date of Birth: Jan 15, 1946 ?Referring Provider:   ?Flowsheet Row Pulmonary Rehab from 12/15/2020 in [facility] Cardiac and Pulmonary Rehab  ?Referring Provider Ottie Glazier MD  ? ?  ? ? ? ?Number of Visits: 87 ? ?Reason for Discharge:  ?Patient reached a stable level of exercise. ?Patient independent in their exercise. ?Patient has met program and personal goals. ? ?Smoking History:  ?Social History  ? ?Tobacco Use  ?Smoking Status Never  ?Smokeless Tobacco Never  ? ? ?Diagnosis:  ?Asthma, unspecified asthma severity, unspecified whether complicated, unspecified whether persistent ? ?ADL UCSD: ? Pulmonary Assessment Scores   ? ? [place] Name 12/15/20 1427  ?  ?  ?  ? ADL UCSD  ? ADL Phase Entry    ? SOB Score total 59    ? Rest 0    ? Walk 5    ? Stairs 5    ? Bath 0    ? Dress 5    ? Shop 4    ?  ? CAT Score  ? CAT Score 37    ?  ? mMRC Score  ? mMRC Score 3    ? ?  ?  ? ?  ? ? ?Initial Exercise Prescription: ? Initial Exercise Prescription - 12/15/20 1400   ? ?  ? Date of Initial Exercise RX and Referring Provider  ? Date 12/15/20   ? Referring Provider Ottie Glazier MD   ?  ? Oxygen  ? Oxygen Continuous   ? Liters 2   when available  ? Maintain Oxygen Saturation 88% or higher   ?  ? Recumbant Bike  ? Level 1   ? RPM 60   ? Minutes 15   ? METs 1   ?  ? NuStep  ? Level 1   ? SPM 80   ? Minutes 15   ? METs 1   ?  ? REL-XR  ? Level 1   ? Speed 50   ? Minutes 15   ? METs 1   ?  ? Prescription Details  ? Frequency (times per week) 3   ? Duration Progress to 30 minutes of continuous aerobic without signs/symptoms of physical distress   ?  ? Intensity  ? THRR 40-80% of Max Heartrate 111-134   ? Ratings of Perceived Exertion 11-13   ? Perceived Dyspnea 0-4   ?  ? Progression  ? Progression Continue to progress workloads to maintain intensity without signs/symptoms of physical distress.   ?  ? Resistance Training  ? Training Prescription Yes   ?  Weight 2 lb   ? Reps 10-15   ? ?  ?  ? ?  ? ? ?Discharge Exercise Prescription (Final Exercise Prescription Changes): ? Exercise Prescription Changes - 04/18/21 1100   ? ?  ? Response to Exercise  ? Blood Pressure (Admit) 140/72   ? Blood Pressure (Exit) 122/82   ? Heart Rate (Admit) 90 bpm   ? Heart Rate (Exercise) 88 bpm   ? Heart Rate (Exit) 81 bpm   ? Oxygen Saturation (Admit) 96 %   ? Oxygen Saturation (Exercise) 96 %   ? Oxygen Saturation (Exit) 97 %   ? Rating of Perceived Exertion (Exercise) 13   ? Perceived Dyspnea (Exercise) 1   ? Symptoms SOB   ? Duration Continue with 30 min of aerobic exercise without signs/symptoms of physical distress.   ? Intensity THRR unchanged   ?  ?  Progression  ? Progression Continue to progress workloads to maintain intensity without signs/symptoms of physical distress.   ? Average METs 1.3   ?  ? Resistance Training  ? Training Prescription Yes   ? Weight 2 lb   ? Reps 10-15   ?  ? Interval Training  ? Interval Training No   ?  ? T5 Nustep  ? Level 1   ? Minutes 15   ? METs 1.1   ?  ? Biostep-RELP  ? Level 1   ? Minutes 15   ? METs 1   ?  ? Home Exercise Plan  ? Plans to continue exercise at Home (comment)   ? Frequency Add 1 additional day to program exercise sessions.   ? Initial Home Exercises Provided 01/18/21   ?  ? Oxygen  ? Maintain Oxygen Saturation 88% or higher   ? ?  ?  ? ?  ? ? ?Functional Capacity: ? 6 Minute Walk   ? ? [place] Name 12/15/20 1431 04/13/21 1428  ?  ?  ? 6 Minute Walk  ? Phase Initial Discharge   ? Distance 90 feet 10 feet   ? Distance % Change -- -88.9 %   ? Distance Feet Change -- -80 ft   ? Walk Time 4 minutes  Break 1:54-4:10 4.58 minutes   ? # of Rest Breaks 1 0   ? MPH 0.17 0.02   ? METS 0.1 --   ? RPE 15 19   ? Perceived Dyspnea  3 3   ? VO2 Peak 0.35 --   ? Symptoms Yes (comment) Yes (comment)   ? Comments SOB, Right knee pain 5/10 knees feel like needles poking   ? Resting HR 89 bpm 94 bpm   ? Resting BP 140/70 132/64   ? Resting Oxygen  Saturation  94 % 96 %   ? Exercise Oxygen Saturation  during 6 min walk 93 % 93 %   ? Max Ex. HR 102 bpm 100 bpm   ? Max Ex. BP 162/72 142/64   ? 2 Minute Post BP 144/74 136/70   ?  ? Interval HR  ? 1 Minute HR 102 97   ? 2 Minute HR 98 99   ? 3 Minute HR 99 98   ? 4 Minute HR 97 100   ? 5 Minute HR 102 --   ? 6 Minute HR 88 --   ? 2 Minute Post HR 84 97   ? Interval Heart Rate? Yes Yes   ?  ? Interval Oxygen  ? Interval Oxygen? Yes Yes   ? Baseline Oxygen Saturation % 94 % 96 %   ? 1 Minute Oxygen Saturation % 93 % 94 %   ? 1 Minute Liters of Oxygen 0 L  RA 0 L  Room Air   ? 2 Minute Oxygen Saturation % 95 % 94 %   ? 2 Minute Liters of Oxygen 0 L 0 L   ? 3 Minute Oxygen Saturation % 94 % 93 %   ? 3 Minute Liters of Oxygen 0 L 0 L   ? 4 Minute Oxygen Saturation % 97 % 93 %   ? 4 Minute Liters of Oxygen 0 L 0 L   ? 5 Minute Oxygen Saturation % 96 % --   ? 5 Minute Liters of Oxygen 0 L --   ? 6 Minute Oxygen Saturation % 95 % --   ? 6 Minute Liters of Oxygen  0 L --   ? 2 Minute Post Oxygen Saturation % 96 % 94 %   ? 2 Minute Post Liters of Oxygen 0 L 0 L   ? ?  ?  ? ?  ? ? ?Psychological, QOL, Others - Outcomes: ?PHQ 2/9: ?Depression screen [facility] 2/9 04/03/2021 01/16/2021 12/15/2020  ?Decreased Interest 0 2 2  ?Down, Depressed, Hopeless 0 0 0  ?PHQ - 2 Score 0 2 2  ?Altered sleeping 0 2 3  ?Tired, decreased energy '1 2 2  ' ?Change in appetite 0 2 3  ?Feeling bad or failure about yourself  0 0 0  ?Trouble concentrating 0 0 3  ?Moving slowly or fidgety/restless 0 0 2  ?Suicidal thoughts 0 0 0  ?PHQ-9 Score '1 8 15  ' ?Difficult doing work/chores Not difficult at all Somewhat difficult Somewhat difficult  ? ? ?Quality of Life: ? ? ? ? ? ?Nutrition & Weight - Outcomes: ? Pre Biometrics - 12/15/20 1425   ? ?  ? Pre Biometrics  ? Height 5' 0.5" (1.537 m)   ? Weight 208 lb 9.6 oz (94.6 kg)   ? BMI (Calculated) 40.05   ? ?  ?  ? ?  ? ? Post Biometrics - 04/13/21 1430   ? ?  ?  Post  Biometrics  ? Height 5' 0.5" (1.537 m)   ? Weight  204 lb 3.2 oz (92.6 kg)   ? BMI (Calculated) 39.21   ? ?  ?  ? ?  ? ? ?Nutrition: ? Nutrition Therapy & Goals - 01/04/21 1435   ? ?  ? Personal Nutrition Goals  ? Comments Daughter told RD that they have already started making changes like eating eggs, adding blue berries and blackberries to yogurt.   ? ?  ?  ? ?  ? ? ?Nutrition Discharge: ? ? ?Education Questionnaire Score: ? Knowledge Questionnaire Score - 12/15/20 1429   ? ?  ? Knowledge Questionnaire Score  ? Pre Score 16/18: Lung disease, Oxygen   ? ?  ?  ? ?  ? ? ?Goals reviewed with patient; copy given to patient. ?

## 2021-05-01 ENCOUNTER — Other Ambulatory Visit: Payer: Self-pay

## 2021-05-01 ENCOUNTER — Emergency Department: Payer: Medicare HMO

## 2021-05-01 DIAGNOSIS — I959 Hypotension, unspecified: Secondary | ICD-10-CM | POA: Diagnosis present

## 2021-05-01 DIAGNOSIS — Z90710 Acquired absence of both cervix and uterus: Secondary | ICD-10-CM

## 2021-05-01 DIAGNOSIS — G4730 Sleep apnea, unspecified: Secondary | ICD-10-CM | POA: Diagnosis present

## 2021-05-01 DIAGNOSIS — Z947 Corneal transplant status: Secondary | ICD-10-CM

## 2021-05-01 DIAGNOSIS — Z7984 Long term (current) use of oral hypoglycemic drugs: Secondary | ICD-10-CM

## 2021-05-01 DIAGNOSIS — N1831 Chronic kidney disease, stage 3a: Secondary | ICD-10-CM | POA: Diagnosis present

## 2021-05-01 DIAGNOSIS — K219 Gastro-esophageal reflux disease without esophagitis: Secondary | ICD-10-CM | POA: Diagnosis present

## 2021-05-01 DIAGNOSIS — J45909 Unspecified asthma, uncomplicated: Secondary | ICD-10-CM | POA: Diagnosis present

## 2021-05-01 DIAGNOSIS — Z86010 Personal history of colonic polyps: Secondary | ICD-10-CM

## 2021-05-01 DIAGNOSIS — F32A Depression, unspecified: Secondary | ICD-10-CM | POA: Diagnosis present

## 2021-05-01 DIAGNOSIS — E785 Hyperlipidemia, unspecified: Secondary | ICD-10-CM | POA: Diagnosis present

## 2021-05-01 DIAGNOSIS — I129 Hypertensive chronic kidney disease with stage 1 through stage 4 chronic kidney disease, or unspecified chronic kidney disease: Secondary | ICD-10-CM | POA: Diagnosis present

## 2021-05-01 DIAGNOSIS — Z803 Family history of malignant neoplasm of breast: Secondary | ICD-10-CM

## 2021-05-01 DIAGNOSIS — M109 Gout, unspecified: Secondary | ICD-10-CM | POA: Diagnosis present

## 2021-05-01 DIAGNOSIS — Z7982 Long term (current) use of aspirin: Secondary | ICD-10-CM

## 2021-05-01 DIAGNOSIS — F419 Anxiety disorder, unspecified: Secondary | ICD-10-CM | POA: Diagnosis present

## 2021-05-01 DIAGNOSIS — G9341 Metabolic encephalopathy: Secondary | ICD-10-CM | POA: Diagnosis present

## 2021-05-01 DIAGNOSIS — Z79890 Hormone replacement therapy (postmenopausal): Secondary | ICD-10-CM

## 2021-05-01 DIAGNOSIS — E039 Hypothyroidism, unspecified: Secondary | ICD-10-CM | POA: Diagnosis present

## 2021-05-01 DIAGNOSIS — N390 Urinary tract infection, site not specified: Principal | ICD-10-CM | POA: Diagnosis present

## 2021-05-01 DIAGNOSIS — K529 Noninfective gastroenteritis and colitis, unspecified: Secondary | ICD-10-CM | POA: Diagnosis present

## 2021-05-01 DIAGNOSIS — E860 Dehydration: Secondary | ICD-10-CM | POA: Diagnosis present

## 2021-05-01 DIAGNOSIS — Z8249 Family history of ischemic heart disease and other diseases of the circulatory system: Secondary | ICD-10-CM

## 2021-05-01 DIAGNOSIS — Z20822 Contact with and (suspected) exposure to covid-19: Secondary | ICD-10-CM | POA: Diagnosis present

## 2021-05-01 DIAGNOSIS — B961 Klebsiella pneumoniae [K. pneumoniae] as the cause of diseases classified elsewhere: Secondary | ICD-10-CM | POA: Diagnosis present

## 2021-05-01 DIAGNOSIS — N179 Acute kidney failure, unspecified: Secondary | ICD-10-CM | POA: Diagnosis present

## 2021-05-01 DIAGNOSIS — Z794 Long term (current) use of insulin: Secondary | ICD-10-CM

## 2021-05-01 DIAGNOSIS — E1122 Type 2 diabetes mellitus with diabetic chronic kidney disease: Secondary | ICD-10-CM | POA: Diagnosis present

## 2021-05-01 LAB — CBC
HCT: 42.6 % (ref 36.0–46.0)
Hemoglobin: 13.7 g/dL (ref 12.0–15.0)
MCH: 29.7 pg (ref 26.0–34.0)
MCHC: 32.2 g/dL (ref 30.0–36.0)
MCV: 92.2 fL (ref 80.0–100.0)
Platelets: 452 10*3/uL — ABNORMAL HIGH (ref 150–400)
RBC: 4.62 MIL/uL (ref 3.87–5.11)
RDW: 14.9 % (ref 11.5–15.5)
WBC: 7.6 10*3/uL (ref 4.0–10.5)
nRBC: 0 % (ref 0.0–0.2)

## 2021-05-01 LAB — COMPREHENSIVE METABOLIC PANEL
ALT: 41 U/L (ref 0–44)
AST: 34 U/L (ref 15–41)
Albumin: 4 g/dL (ref 3.5–5.0)
Alkaline Phosphatase: 51 U/L (ref 38–126)
Anion gap: 14 (ref 5–15)
BUN: 19 mg/dL (ref 8–23)
CO2: 19 mmol/L — ABNORMAL LOW (ref 22–32)
Calcium: 9.7 mg/dL (ref 8.9–10.3)
Chloride: 102 mmol/L (ref 98–111)
Creatinine, Ser: 1.15 mg/dL — ABNORMAL HIGH (ref 0.44–1.00)
GFR, Estimated: 50 mL/min — ABNORMAL LOW (ref >60)
Glucose, Bld: 174 mg/dL — ABNORMAL HIGH (ref 70–99)
Potassium: 3.7 mmol/L (ref 3.5–5.1)
Sodium: 135 mmol/L (ref 135–145)
Total Bilirubin: 0.5 mg/dL (ref 0.3–1.2)
Total Protein: 8.2 g/dL — ABNORMAL HIGH (ref 6.5–8.1)

## 2021-05-01 LAB — LIPASE, BLOOD: Lipase: 47 U/L (ref 11–51)

## 2021-05-01 LAB — TROPONIN I (HIGH SENSITIVITY)
Troponin I (High Sensitivity): 7 ng/L (ref <18)
Troponin I (High Sensitivity): 6 ng/L (ref <18)

## 2021-05-01 NOTE — ED Triage Notes (Signed)
Pt presents via POV c/o increasing SOB and abd pain since Friday. Pt reports has had diarrhea since Friday. Reports seen PCP on Thursday. Reports continued symptoms.  ?

## 2021-05-02 ENCOUNTER — Encounter: Payer: Self-pay | Admitting: Internal Medicine

## 2021-05-02 ENCOUNTER — Emergency Department: Payer: Medicare HMO

## 2021-05-02 ENCOUNTER — Inpatient Hospital Stay
Admission: EM | Admit: 2021-05-02 | Discharge: 2021-05-04 | DRG: 689 | Disposition: A | Discharge status: Home Health | Payer: Medicare HMO | Attending: Internal Medicine | Admitting: Internal Medicine

## 2021-05-02 ENCOUNTER — Other Ambulatory Visit: Payer: Self-pay

## 2021-05-02 DIAGNOSIS — F32A Depression, unspecified: Secondary | ICD-10-CM | POA: Diagnosis present

## 2021-05-02 DIAGNOSIS — R4182 Altered mental status, unspecified: Secondary | ICD-10-CM

## 2021-05-02 DIAGNOSIS — N3000 Acute cystitis without hematuria: Secondary | ICD-10-CM | POA: Diagnosis not present

## 2021-05-02 DIAGNOSIS — F419 Anxiety disorder, unspecified: Secondary | ICD-10-CM | POA: Diagnosis present

## 2021-05-02 DIAGNOSIS — N1831 Chronic kidney disease, stage 3a: Secondary | ICD-10-CM | POA: Diagnosis not present

## 2021-05-02 DIAGNOSIS — R0602 Shortness of breath: Secondary | ICD-10-CM

## 2021-05-02 DIAGNOSIS — E1122 Type 2 diabetes mellitus with diabetic chronic kidney disease: Secondary | ICD-10-CM | POA: Diagnosis not present

## 2021-05-02 DIAGNOSIS — R197 Diarrhea, unspecified: Secondary | ICD-10-CM

## 2021-05-02 DIAGNOSIS — G9341 Metabolic encephalopathy: Secondary | ICD-10-CM | POA: Diagnosis present

## 2021-05-02 DIAGNOSIS — I10 Essential (primary) hypertension: Secondary | ICD-10-CM | POA: Diagnosis present

## 2021-05-02 DIAGNOSIS — E119 Type 2 diabetes mellitus without complications: Secondary | ICD-10-CM

## 2021-05-02 DIAGNOSIS — R1084 Generalized abdominal pain: Principal | ICD-10-CM

## 2021-05-02 DIAGNOSIS — N390 Urinary tract infection, site not specified: Secondary | ICD-10-CM

## 2021-05-02 DIAGNOSIS — E860 Dehydration: Secondary | ICD-10-CM

## 2021-05-02 DIAGNOSIS — Z794 Long term (current) use of insulin: Secondary | ICD-10-CM

## 2021-05-02 DIAGNOSIS — R112 Nausea with vomiting, unspecified: Secondary | ICD-10-CM

## 2021-05-02 DIAGNOSIS — E039 Hypothyroidism, unspecified: Secondary | ICD-10-CM | POA: Diagnosis present

## 2021-05-02 DIAGNOSIS — E1159 Type 2 diabetes mellitus with other circulatory complications: Secondary | ICD-10-CM | POA: Diagnosis present

## 2021-05-02 LAB — URINALYSIS, ROUTINE W REFLEX MICROSCOPIC
Bilirubin Urine: NEGATIVE
Glucose, UA: NEGATIVE mg/dL
Ketones, ur: NEGATIVE mg/dL
Nitrite: POSITIVE — AB
Protein, ur: 30 mg/dL — AB
Specific Gravity, Urine: 1.016 (ref 1.005–1.030)
WBC, UA: >50 WBC/hpf — ABNORMAL HIGH (ref 0–5)
pH: 5 (ref 5.0–8.0)

## 2021-05-02 LAB — LACTIC ACID, PLASMA
Lactic Acid, Venous: 1.4 mmol/L (ref 0.5–1.9)
Lactic Acid, Venous: 0.9 mmol/L (ref 0.5–1.9)

## 2021-05-02 LAB — RESP PANEL BY RT-PCR (FLU A&B, COVID) ARPGX2
Influenza A by PCR: NEGATIVE
Influenza B by PCR: NEGATIVE
SARS Coronavirus 2 by RT PCR: NEGATIVE

## 2021-05-02 LAB — CBG MONITORING, ED: Glucose-Capillary: 164 mg/dL — ABNORMAL HIGH (ref 70–99)

## 2021-05-02 LAB — GLUCOSE, CAPILLARY
Glucose-Capillary: 126 mg/dL — ABNORMAL HIGH (ref 70–99)
Glucose-Capillary: 128 mg/dL — ABNORMAL HIGH (ref 70–99)

## 2021-05-02 MED ORDER — LINACLOTIDE 145 MCG PO CAPS
290.0000 ug | ORAL_CAPSULE | Freq: Every day | ORAL | Status: DC
  Administered 2021-05-03 – 2021-05-04 (×2): 290 ug via ORAL
  Filled 2021-05-02 (×2): qty 2
  Filled 2021-05-02: qty 1
  Filled 2021-05-02: qty 2

## 2021-05-02 MED ORDER — MIRTAZAPINE 15 MG PO TABS: 15.0000 mg | ORAL_TABLET | Freq: Every day | ORAL | Status: DC

## 2021-05-02 MED ORDER — ONDANSETRON HCL 4 MG/2ML IJ SOLN
4.0000 mg | Freq: Once | INTRAMUSCULAR | Status: AC
  Administered 2021-05-02: 4 mg via INTRAVENOUS
  Filled 2021-05-02: qty 2

## 2021-05-02 MED ORDER — SODIUM CHLORIDE 0.9 % IV SOLN
1.0000 g | INTRAVENOUS | Status: DC
  Administered 2021-05-02 – 2021-05-03 (×2): 1 g via INTRAVENOUS
  Filled 2021-05-02 (×3): qty 10

## 2021-05-02 MED ORDER — ASPIRIN EC 81 MG PO TBEC
81.0000 mg | DELAYED_RELEASE_TABLET | Freq: Every day | ORAL | Status: DC
  Administered 2021-05-02 – 2021-05-04 (×3): 81 mg via ORAL
  Filled 2021-05-02 (×3): qty 1

## 2021-05-02 MED ORDER — VITAMIN D 25 MCG (1000 UNIT) PO TABS
5000.0000 [IU] | ORAL_TABLET | Freq: Every day | ORAL | Status: DC
  Administered 2021-05-03 – 2021-05-04 (×2): 5000 [IU] via ORAL
  Filled 2021-05-02 (×2): qty 5

## 2021-05-02 MED ORDER — ONDANSETRON HCL 4 MG/2ML IJ SOLN
4.0000 mg | Freq: Four times a day (QID) | INTRAMUSCULAR | Status: DC | PRN
  Administered 2021-05-03: 4 mg via INTRAVENOUS
  Filled 2021-05-02: qty 2

## 2021-05-02 MED ORDER — PIPERACILLIN-TAZOBACTAM 3.375 G IVPB 30 MIN
3.3750 g | Freq: Once | INTRAVENOUS | Status: AC
  Administered 2021-05-02: 3.375 g via INTRAVENOUS
  Filled 2021-05-02: qty 50

## 2021-05-02 MED ORDER — SODIUM CHLORIDE 0.9 % IV SOLN: INTRAVENOUS | Status: AC

## 2021-05-02 MED ORDER — ACETAMINOPHEN 650 MG RE SUPP: 650.0000 mg | Freq: Four times a day (QID) | RECTAL | Status: DC | PRN

## 2021-05-02 MED ORDER — ONDANSETRON HCL 4 MG PO TABS: 4.0000 mg | ORAL_TABLET | Freq: Four times a day (QID) | ORAL | Status: DC | PRN

## 2021-05-02 MED ORDER — ENOXAPARIN SODIUM 60 MG/0.6ML IJ SOSY
0.5000 mg/kg | PREFILLED_SYRINGE | INTRAMUSCULAR | Status: DC
  Administered 2021-05-02 – 2021-05-04 (×3): 45 mg via SUBCUTANEOUS
  Filled 2021-05-02 (×3): qty 0.6

## 2021-05-02 MED ORDER — MORPHINE SULFATE (PF) 2 MG/ML IV SOLN
2.0000 mg | Freq: Once | INTRAVENOUS | Status: AC
  Administered 2021-05-02: 2 mg via INTRAVENOUS
  Filled 2021-05-02: qty 1

## 2021-05-02 MED ORDER — ENOXAPARIN SODIUM 40 MG/0.4ML IJ SOSY: 40.0000 mg | PREFILLED_SYRINGE | INTRAMUSCULAR | Status: DC

## 2021-05-02 MED ORDER — LORAZEPAM 1 MG PO TABS: 1.0000 mg | ORAL_TABLET | Freq: Four times a day (QID) | ORAL | Status: DC

## 2021-05-02 MED ORDER — PANTOPRAZOLE SODIUM 40 MG PO TBEC
40.0000 mg | DELAYED_RELEASE_TABLET | Freq: Every day | ORAL | Status: DC
Start: 2021-05-02 — End: 2021-05-02

## 2021-05-02 MED ORDER — PREGABALIN 75 MG PO CAPS
75.0000 mg | ORAL_CAPSULE | Freq: Three times a day (TID) | ORAL | Status: DC
  Administered 2021-05-02 – 2021-05-04 (×7): 75 mg via ORAL
  Filled 2021-05-02 (×5): qty 1
  Filled 2021-05-02: qty 3
  Filled 2021-05-02: qty 1

## 2021-05-02 MED ORDER — LORAZEPAM 1 MG PO TABS: 1.0000 mg | ORAL_TABLET | Freq: Four times a day (QID) | ORAL | Status: DC | PRN

## 2021-05-02 MED ORDER — VITAMIN D 25 MCG (1000 UNIT) PO TABS: 5000.0000 [IU] | ORAL_TABLET | Freq: Every day | ORAL | Status: DC

## 2021-05-02 MED ORDER — LEVOTHYROXINE SODIUM 25 MCG PO TABS
25.0000 ug | ORAL_TABLET | Freq: Every day | ORAL | Status: DC
  Administered 2021-05-02 – 2021-05-04 (×3): 25 ug via ORAL
  Filled 2021-05-02 (×3): qty 1

## 2021-05-02 MED ORDER — LORATADINE 10 MG PO TABS
10.0000 mg | ORAL_TABLET | Freq: Every day | ORAL | Status: DC
  Administered 2021-05-02 – 2021-05-04 (×3): 10 mg via ORAL
  Filled 2021-05-02 (×3): qty 1

## 2021-05-02 MED ORDER — ATORVASTATIN CALCIUM 20 MG PO TABS
80.0000 mg | ORAL_TABLET | Freq: Every evening | ORAL | Status: DC
Start: 2021-05-02 — End: 2021-05-04
  Administered 2021-05-02 – 2021-05-03 (×2): 80 mg via ORAL
  Filled 2021-05-02 (×2): qty 4

## 2021-05-02 MED ORDER — LORAZEPAM 2 MG/ML IJ SOLN
0.5000 mg | Freq: Once | INTRAMUSCULAR | Status: AC
  Administered 2021-05-02: 0.5 mg via INTRAVENOUS
  Filled 2021-05-02: qty 1

## 2021-05-02 MED ORDER — INSULIN ASPART 100 UNIT/ML IJ SOLN
0.0000 [IU] | Freq: Three times a day (TID) | INTRAMUSCULAR | Status: DC
  Administered 2021-05-02: 2 [IU] via SUBCUTANEOUS
  Administered 2021-05-02 – 2021-05-03 (×2): 3 [IU] via SUBCUTANEOUS
  Administered 2021-05-03 (×2): 2 [IU] via SUBCUTANEOUS
  Administered 2021-05-04: 09:00:00 3 [IU] via SUBCUTANEOUS
  Filled 2021-05-02 (×6): qty 1

## 2021-05-02 MED ORDER — IOHEXOL 300 MG/ML  SOLN
100.0000 mL | Freq: Once | INTRAMUSCULAR | Status: AC | PRN
  Administered 2021-05-02: 100 mL via INTRAVENOUS

## 2021-05-02 MED ORDER — ALLOPURINOL 100 MG PO TABS
100.0000 mg | ORAL_TABLET | Freq: Every day | ORAL | Status: DC
  Administered 2021-05-02 – 2021-05-03 (×2): 100 mg via ORAL
  Filled 2021-05-02 (×3): qty 1

## 2021-05-02 MED ORDER — FERROUS SULFATE 325 (65 FE) MG PO TABS
325.0000 mg | ORAL_TABLET | Freq: Every day | ORAL | Status: DC
  Administered 2021-05-02 – 2021-05-04 (×3): 325 mg via ORAL
  Filled 2021-05-02 (×3): qty 1

## 2021-05-02 MED ORDER — VIBEGRON 75 MG PO TABS: 75.0000 mg | ORAL_TABLET | Freq: Every day | ORAL | Status: DC

## 2021-05-02 MED ORDER — SODIUM CHLORIDE 0.9 % IV BOLUS
1000.0000 mL | Freq: Once | INTRAVENOUS | Status: AC
  Administered 2021-05-02: 1000 mL via INTRAVENOUS

## 2021-05-02 MED ORDER — FLUOXETINE HCL 20 MG PO CAPS
40.0000 mg | ORAL_CAPSULE | Freq: Every day | ORAL | Status: DC
  Administered 2021-05-02 – 2021-05-04 (×3): 40 mg via ORAL
  Filled 2021-05-02 (×3): qty 2

## 2021-05-02 MED ORDER — ACETAMINOPHEN 325 MG PO TABS
650.0000 mg | ORAL_TABLET | Freq: Four times a day (QID) | ORAL | Status: DC | PRN
  Administered 2021-05-03 – 2021-05-04 (×3): 650 mg via ORAL
  Filled 2021-05-02 (×3): qty 2

## 2021-05-02 NOTE — Assessment & Plan Note (Signed)
We will hold carvedilol, hydrochlorothiazide and losartan due to relative hypotension ?

## 2021-05-02 NOTE — Assessment & Plan Note (Signed)
Patient has a history of insulin-dependent diabetes mellitus and is also on oral hypoglycemic agents ?We will hold long-acting insulin due to poor oral intake ?Hold oral hypoglycemic agents ?Glycemic control with sliding scale insulin ?Advance diet as tolerated ?

## 2021-05-02 NOTE — ED Notes (Signed)
Pt used restroom ?

## 2021-05-02 NOTE — ED Provider Notes (Signed)
[facility] Provider Note    Event Date/Time   First MD Initiated Contact with Patient 05/02/21 0128     (approximate)   History   Shortness of Breath and Abdominal Pain   HPI  History obtained via patient and her daughter  Madeline Martinez is a 76 y.o. female who presents to the ED from home with a chief complaint of abdominal pain, nausea, vomiting, diarrhea and shortness of breath.  Symptoms x3 to 4 days.  Unable to tolerate sips of Pedialyte.  Patient with a medical history of asthma, hypertension and diabetes.  Denies fever, chest pain, dysuria.  Denies sick contacts.  Denies recent travel, trauma or antibiotic use.  Daughter has noted patient with increased confusion.     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   HTN (hypertension)    Diabetes mellitus    Hyperlipidemia    Depression    Esophageal reflux 01/24/2010     Past Surgical History   Past Surgical History:  Procedure Laterality Date   ABDOMINAL HYSTERECTOMY     BACK SURGERY  2007   DISCS   BIOPSY  10/08/2018   Procedure: BIOPSY;  Surgeon: Irving Copas., MD;  Location: [facility];  Service: Gastroenterology;;   COLONOSCOPY WITH PROPOFOL N/A 12/19/2017   Procedure: COLONOSCOPY WITH PROPOFOL;  Surgeon: Jonathon Bellows, MD;  Location: [facility] ENDOSCOPY;  Service: Gastroenterology;  Laterality: N/A;   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 3   BILATERAL   ENDOSCOPIC MUCOSAL RESECTION N/A 10/08/2018   Procedure: ENDOSCOPIC MUCOSAL RESECTION;  Surgeon: Rush Landmark Telford Nab., MD;   Location: [facility] ENDOSCOPY;  Service: Gastroenterology;  Laterality: N/A;   ESOPHAGOGASTRODUODENOSCOPY (EGD) WITH PROPOFOL N/A 08/28/2018   Procedure: ESOPHAGOGASTRODUODENOSCOPY (EGD) WITH PROPOFOL;  Surgeon: Jonathon Bellows, MD;  Location: [facility] ENDOSCOPY;  Service: Gastroenterology;  Laterality: N/A;   ESOPHAGOGASTRODUODENOSCOPY (EGD) WITH PROPOFOL N/A 10/08/2018   Procedure: ESOPHAGOGASTRODUODENOSCOPY (EGD) WITH PROPOFOL;  Surgeon: Rush Landmark Telford Nab., MD;  Location: [facility] ENDOSCOPY;  Service: Gastroenterology;  Laterality: N/A;   EUS N/A 10/08/2018   Procedure: UPPER ENDOSCOPIC ULTRASOUND (EUS) RADIAL;  Surgeon: Irving Copas., MD;  Location: [facility] ENDOSCOPY;  Service: Gastroenterology;  Laterality: N/A;   PARTIAL HYSTERECTOMY  1986   POLYPECTOMY  10/08/2018   Procedure: POLYPECTOMY;  Surgeon: Mansouraty, Telford Nab., MD;  Location: [facility];  Service: Gastroenterology;;     Home Medications   Prior to Admission medications   Medication Sig Start Date End Date Taking? Authorizing Provider  allopurinol (ZYLOPRIM) 100 MG tablet Take 100 mg by mouth daily with lunch.  04/30/16   [provider]  aspirin 81 MG EC tablet Take by mouth.    [provider]  atorvastatin (LIPITOR) 80 MG tablet Take 80 mg by mouth daily.    [provider]  Biotin 5000 MCG TABS Take 5,000 mcg by mouth daily. HAIR/SKIN/NAILS    [provider]  carvedilol (COREG) 12.5 MG tablet Take 12.5 mg by mouth 2 (two)  times daily.    [provider]  cetirizine (ZYRTEC) 10 MG tablet Take 10 mg by mouth daily. 04/20/17   [provider]  Cholecalciferol (VITAMIN D-3) 125 MCG (5000 UT) TABS Take 5,000 Units by mouth daily.    [provider]  DEXILANT 60 MG capsule Take 1 capsule by mouth daily. 02/09/20   [provider]  ferrous sulfate 325 (65 FE) MG tablet Take 325 mg by mouth daily. 12/03/19   [provider]  FLUoxetine (PROZAC) 40 MG capsule Take  40 mg by mouth daily.    [provider]  gabapentin (NEURONTIN) 400 MG capsule Take 400 mg by mouth daily. 01/14/20   [provider]  glipiZIDE (GLUCOTROL XL) 5 MG 24 hr tablet Take 5 mg by mouth 3 (three) times daily.    [provider]  hydrochlorothiazide (HYDRODIURIL) 25 MG tablet Take 25 mg by mouth daily. 02/09/20   [provider]  HYDROcodone-acetaminophen (NORCO/VICODIN) 5-325 MG tablet Take 1 tablet by mouth daily as needed (pain.).  08/11/18   [provider]  levothyroxine (SYNTHROID) 25 MCG tablet Take 25 mcg by mouth daily. 02/01/20   [provider]  lidocaine (LIDODERM) 5 % Place 1 patch onto the skin daily. Remove & Discard patch within 12 hours or as directed by MD 04/01/20   Tegeler, Gwenyth Allegra, MD  LINZESS 290 MCG CAPS capsule TAKE 1 CAPSULE BY MOUTH DAILY BEFORE BREAKFAST. 10/17/20   Jonathon Bellows, MD  LORazepam (ATIVAN) 1 MG tablet Take 1 mg by mouth 4 (four) times daily.  08/17/18   [provider]  losartan (COZAAR) 100 MG tablet Take 100 mg by mouth daily. 02/09/20   [provider]  meloxicam (MOBIC) 15 MG tablet Take 15 mg by mouth daily. Patient not taking: Reported on 03/15/2021    [provider]  metFORMIN (GLUCOPHAGE) 1000 MG tablet Take 1,000 mg by mouth 2 (two) times daily.    [provider]  methocarbamol (ROBAXIN) 750 MG tablet Take 750 mg by mouth daily with lunch. Patient not taking: Reported on 03/15/2021 05/10/16   [provider]  mirtazapine (REMERON) 15 MG tablet Take 15 mg by mouth at bedtime.  04/29/16   [provider]  omeprazole (PRILOSEC) 20 MG capsule TAKE 2 CAPSULES (40 MG TOTAL) BY MOUTH 2 (TWO) TIMES DAILY BEFORE A MEAL. 03/24/19   Mansouraty, Telford Nab., MD  oxyCODONE-acetaminophen (PERCOCET/ROXICET) 5-325 MG tablet Take 1 tablet by mouth every 4 (four) hours as needed. 04/01/20   Tegeler, Gwenyth Allegra, MD  pregabalin (LYRICA) 75 MG capsule Take  75 mg by mouth 3 (three) times daily. 02/05/20   [provider]  Probiotic Product (PROBIOTIC PO) Take 1 tablet by mouth daily.    [provider]  TOUJEO SOLOSTAR 300 UNIT/ML SOPN Inject 20 Units into the skin daily.  09/18/17   [provider]  Vibegron (GEMTESA) 75 MG TABS Take 75 mg by mouth daily. 02/07/21   McGowan, Larene Beach A, PA-C  Vibegron (GEMTESA) 75 MG TABS Take 75 mg by mouth daily. 03/15/21   McGowan, Hunt Oris, PA-C  vitamin E 45 MG (100 UNITS) capsule Take by mouth.    [provider]     Allergies  Patient has no known allergies.   Family History   Family History  Problem Relation Age of Onset   Breast cancer Mother    Heart failure Father    Other Sister        DEGEN.Leith  DISEASE   Breast cancer Maternal Grandmother    Kidney cancer Neg Hx    Kidney disease Neg Hx    Prostate cancer Neg Hx      Physical Exam  Triage Vital Signs: ED Triage Vitals  Enc Vitals Group     BP 05/01/21 1932 126/74     Pulse Rate 05/01/21 1932 94     Resp 05/01/21 1932 (!) 22     Temp 05/01/21 1932 98.5 F (36.9 C)     Temp Source 05/01/21 1932 Oral     SpO2 05/01/21 1932 99 %     Weight --      Height --      Head Circumference --      Peak Flow --      Pain Score 05/01/21 2003 10     Pain Loc --      Pain Edu? --      Excl. in [place]? --     Updated Vital Signs: BP (!) 123/59    Pulse 88    Temp 98.5 F (36.9 C) (Oral)    Resp 15    Wt 91.5 kg    SpO2 95%    BMI 38.75 kg/m    General: Awake, mild distress.  Appears weak. CV:  RRR.  Good peripheral perfusion.  Resp:  Normal effort.  Mildly diminished but otherwise  CTAB. Abd:  Mild diffuse tenderness to palpation without rebound or guarding.  No distention.  No CVAT. Other:  Mildly dry mucous membranes.  Alert and oriented to person and place.  Intermittently confused.   ED Results / Procedures / Treatments  Labs (all labs ordered are listed, but only abnormal results are  displayed) Labs Reviewed  COMPREHENSIVE METABOLIC PANEL - Abnormal; Notable for the following components:      Result Value   CO2 19 (*)    Glucose, Bld 174 (*)    Creatinine, Ser 1.15 (*)    Total Protein 8.2 (*)    GFR, Estimated 50 (*)    All other components within normal limits  CBC - Abnormal; Notable for the following components:   Platelets 452 (*)    All other components within normal limits  URINALYSIS, ROUTINE W REFLEX MICROSCOPIC - Abnormal; Notable for the following components:   Color, Urine YELLOW (*)    APPearance CLOUDY (*)    Hgb urine dipstick MODERATE (*)    Protein, ur 30 (*)    Nitrite POSITIVE (*)    Leukocytes,Ua LARGE (*)    WBC, UA >50 (*)    Bacteria, UA MANY (*)    All other components within normal limits  RESP PANEL BY RT-PCR (FLU A&B, COVID) ARPGX2  CULTURE, BLOOD (ROUTINE X 2)  CULTURE, BLOOD (ROUTINE X 2)  URINE CULTURE  GASTROINTESTINAL PANEL BY PCR, STOOL (REPLACES STOOL CULTURE)  C DIFFICILE QUICK SCREEN W PCR REFLEX    LIPASE, BLOOD  LACTIC ACID, PLASMA  LACTIC ACID, PLASMA  TROPONIN I (HIGH SENSITIVITY)  TROPONIN I (HIGH SENSITIVITY)     EKG  ED ECG REPORT I, Circe Chilton J, the attending physician, personally viewed and interpreted this ECG.   Date: 05/02/2021  EKG Time: 1929  Rate: 92  Rhythm: normal sinus rhythm  Axis: Normal  Intervals: QTc 474  ST&T Change: Nonspecific    RADIOLOGY I have independently visualized and reviewed patient's chest x-ray and CT abdomen pelvis as well as noted the radiology interpretation:  Chest x-ray: Chronic changes; no acute cardiopulmonary process  CT abdomen pelvis: Small hiatal hernia, bilateral renal cyst, no acute abnormality  Official radiology report(s): DG Chest 2 View  Result Date: 05/01/2021 CLINICAL DATA:  Increasing shortness of breath for several days EXAM: CHEST - 2 VIEW COMPARISON:  04/01/2020 FINDINGS: Cardiac shadow is stable. Linear scarring is noted in the left mid  lung stable from the prior exam. No focal infiltrate or effusion is seen. Degenerative changes of the thoracic spine are noted. Stable nodule is noted in the right lower lobe unchanged from prior CT examination. IMPRESSION: No acute abnormality noted.  Chronic changes as described above. Electronically Signed   By: Inez Catalina M.D.   On: 05/01/2021 20:41   CT Abdomen Pelvis W Contrast  Result Date: 05/02/2021 CLINICAL DATA:  Shortness of breath and abdominal pain. EXAM: CT ABDOMEN AND PELVIS WITH CONTRAST TECHNIQUE: Multidetector CT imaging of the abdomen and pelvis was performed using the standard protocol following bolus administration of intravenous contrast. RADIATION DOSE REDUCTION: This exam was performed according to the departmental dose-optimization program which includes automated exposure control, adjustment of the mA and/or kV according to patient size and/or use of iterative reconstruction technique. CONTRAST:  117m OMNIPAQUE IOHEXOL 300 MG/ML  SOLN COMPARISON:  April 01, 2020 FINDINGS: Lower chest: No acute abnormality. Hepatobiliary: A 4 mm focus of parenchymal low attenuation is seen within the posterior aspect of the left lobe of the liver. This is too small to characterize by CT examination. No gallstones, gallbladder wall thickening, or biliary dilatation. Pancreas: Unremarkable. No pancreatic ductal dilatation or surrounding inflammatory changes. Spleen: Normal in size without focal abnormality. Adrenals/Urinary Tract: Adrenal glands are unremarkable. Kidneys are normal in size, without renal calculi or hydronephrosis. There are multiple bilateral simple renal cysts. The largest is seen within the posterior aspect of the mid right kidney and measures 6.2 cm in diameter. The urinary bladder is poorly distended and subsequently limited in evaluation. Stomach/Bowel: There is a small hiatal hernia. Appendix appears normal. No evidence of bowel wall thickening, distention, or inflammatory  changes. Vascular/Lymphatic: Aortic atherosclerosis. No enlarged abdominal or pelvic lymph nodes. Reproductive: Status post hysterectomy. No adnexal masses. Other: No abdominal wall hernia or abnormality. No abdominopelvic ascites. 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 Degenerative changes are seen throughout the lumbar spine. IMPRESSION: 1. Small hiatal hernia. 2. Multiple bilateral simple renal cysts. 3. Aortic atherosclerosis. Aortic Atherosclerosis (ICD10-I70.0). Electronically Signed   By: TVirgina NorfolkM.D.   On: 05/02/2021 02:33     PROCEDURES:  Critical Care performed: No  .1-3 Lead EKG Interpretation Performed by: SPaulette Blanch MD Authorized by: SPaulette Blanch MD     Interpretation: normal     ECG rate:  92   ECG rate assessment: normal     Rhythm: sinus rhythm     Ectopy: none     Conduction: normal   Comments:     Patient placed on cardiac monitor to evaluate for arrhythmias   MEDICATIONS ORDERED IN ED: Medications  enoxaparin (LOVENOX) injection 45 mg (has no administration in time range)  acetaminophen (TYLENOL) tablet 650 mg (has no administration in time range)    Or  acetaminophen (TYLENOL) suppository 650 mg (has no administration in time range)  ondansetron (ZOFRAN) tablet 4 mg (has no administration in time range)    Or  ondansetron (ZOFRAN) injection 4 mg (has no administration in time range)  cefTRIAXone (ROCEPHIN) 1 g in sodium chloride 0.9 % 100 mL IVPB (  has no administration in time range)  sodium chloride 0.9 % bolus 1,000 mL (0 mLs Intravenous Stopped 05/02/21 0305)  ondansetron (ZOFRAN) injection 4 mg (4 mg Intravenous Given 05/02/21 0220)  morphine (PF) 2 MG/ML injection 2 mg (2 mg Intravenous Given 05/02/21 0220)  piperacillin-tazobactam (ZOSYN) IVPB 3.375 g (0 g Intravenous Stopped 05/02/21 0251)  iohexol (OMNIPAQUE) 300 MG/ML solution 100 mL (100 mLs Intravenous Contrast Given 05/02/21  0207)  LORazepam (ATIVAN) injection 0.5 mg (0.5 mg Intravenous Given 05/02/21 0345)     IMPRESSION / MDM / ASSESSMENT AND PLAN / ED COURSE  I reviewed the triage vital signs and the nursing notes.                             76 year old female presenting with abdominal pain, nausea, vomiting, diarrhea and shortness of breath. Differential diagnosis includes, but is not limited to, ovarian cyst, ovarian torsion, acute appendicitis, diverticulitis, urinary tract infection/pyelonephritis, endometriosis, bowel obstruction, colitis, renal colic, gastroenteritis, hernia, fibroids, etc. I have personally reviewed patient's records and note pulmonary rehab notes from 04/26/2021 for asthma.  The patient is on the cardiac monitor to evaluate for evidence of arrhythmia and/or significant heart rate changes.  Laboratory results demonstrate normal WBC, mild AKI with elevation of creatinine compared to 1 year ago, nitrite and leukocyte positive UTI.  Will initiate IV fluid hydration, IV morphine for pain paired with IV Zofran for nausea.  If patient is able to provide stool specimen, will collect for C. difficile and bio fire.  Will obtain CT abdomen pelvis and reassess.  Clinical Course as of 05/02/21 0645  Tue May 02, 2021  0305 CT demonstrates no acute abnormality.  Lactic acid is normal.  Awaiting results of respiratory panel. [JS]  0320 Respiratory panel negative.  Hospital services were consulted for evaluation and admission for altered mentation, AKI due to UTI. [JS]    Clinical Course User Index [JS] Paulette Blanch, MD     FINAL CLINICAL IMPRESSION(S) / ED DIAGNOSES   Final diagnoses:  Generalized abdominal pain  Nausea vomiting and diarrhea  Shortness of breath  Lower urinary tract infectious disease  Altered mental status, unspecified altered mental status type  Dehydration     Rx / DC Orders   ED Discharge Orders     None        Note:  This document was prepared using Dragon  voice recognition software and may include unintentional dictation errors.   Paulette Blanch, MD 05/02/21 [phone]

## 2021-05-02 NOTE — Assessment & Plan Note (Signed)
Continue as needed lorazepam 

## 2021-05-02 NOTE — Progress Notes (Signed)
PHARMACIST - PHYSICIAN COMMUNICATION ? ?CONCERNING:  Enoxaparin (Lovenox) for DVT Prophylaxis  ? ? ?RECOMMENDATION: ?Patient was prescribed enoxaprin '40mg'$  q24 hours for VTE prophylaxis.  ? [name] Weights  ? 05/02/21 0528  ?Weight: 91.5 kg (201 lb 11.5 oz)  ? ? ?Body mass index is 38.75 kg/m?. ? ?Estimated Creatinine Clearance: 43.8 mL/min (A) (by C-G formula based on SCr of 1.15 mg/dL (H)). ? ? ?Based on [place] patient is candidate for enoxaparin 0.'5mg'$ /kg TBW SQ every 24 hours based on BMI being >30. ? ?DESCRIPTION: ?Pharmacy has adjusted enoxaparin dose per [facility] policy. ? ?Patient is now receiving enoxaparin 0.5 mg/kg every 24 hours  ? ?Renda Rolls, PharmD, MBA ?05/02/2021 ?5:32 AM ? ?

## 2021-05-02 NOTE — H&P (Signed)
History and Physical    Patient: Madeline Martinez DOB: [birth year] DOA: 05/02/2021 DOS: the patient was seen and examined on 05/02/2021 PCP: [name], MD  Patient coming from: Home  Chief Complaint:  Chief Complaint  Patient presents with   Shortness of Breath   Abdominal Pain   Most of the history was obtained from ER notes and patient's daughter over the phone as patient is confused. HPI: Madeline Martinez is a 76 y.o. female with medical history significant for GERD, depression, diabetes mellitus, hypertension, dyslipidemia who presents to the ER for evaluation of abdominal pain associated with nausea, vomiting, diarrhea and shortness of breath.  Daughter said she developed symptoms 3 days prior to her admission and was unable to tolerate any oral intake except for some Pedialyte.  She also stated that patient had worsening shortness of breath from her baseline and has had episodes of confusion prompting her visit to the ER. Patient is unable to provide any history but notes that she is in the hospital. I am unable to do a review of systems on this patient. Her labs show significant pyuria with a normal white count. She is afebrile  Review of Systems: unable to review all systems due to the inability of the patient to answer questions. Past Medical History:  Diagnosis Date   Acid reflux    Anxiety    Depression    Diabetes mellitus    Dysrhythmia    Gout    Heart murmur    HTN (hypertension)    Hyperlipidemia    Sleep apnea    Past Surgical History:  Procedure Laterality Date   ABDOMINAL HYSTERECTOMY     BACK SURGERY  2007   DISCS   BIOPSY  10/08/2018   Procedure: BIOPSY;  Surgeon: Irving Copas., MD;  Location: [facility];  Service: Gastroenterology;;   COLONOSCOPY WITH PROPOFOL N/A 12/19/2017   Procedure: COLONOSCOPY WITH PROPOFOL;  Surgeon: Jonathon Bellows, MD;  Location: [facility] ENDOSCOPY;  Service: Gastroenterology;  Laterality: N/A;   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   ENDOSCOPIC MUCOSAL RESECTION N/A 10/08/2018   Procedure: ENDOSCOPIC MUCOSAL RESECTION;  Surgeon: Rush Landmark Telford Nab., MD;  Location: [facility] ENDOSCOPY;  Service: Gastroenterology;  Laterality: N/A;   ESOPHAGOGASTRODUODENOSCOPY (EGD) WITH PROPOFOL N/A 08/28/2018   Procedure: ESOPHAGOGASTRODUODENOSCOPY (EGD) WITH PROPOFOL;  Surgeon: Jonathon Bellows, MD;  Location: [facility] ENDOSCOPY;  Service: Gastroenterology;  Laterality: N/A;   ESOPHAGOGASTRODUODENOSCOPY (EGD) WITH PROPOFOL N/A 10/08/2018   Procedure: ESOPHAGOGASTRODUODENOSCOPY (EGD) WITH PROPOFOL;  Surgeon: Rush Landmark Telford Nab., MD;  Location: [facility] ENDOSCOPY;  Service: Gastroenterology;  Laterality: N/A;   EUS N/A 10/08/2018   Procedure: UPPER ENDOSCOPIC ULTRASOUND (EUS) RADIAL;  Surgeon: Irving Copas., MD;  Location: [facility] ENDOSCOPY;  Service: Gastroenterology;  Laterality: N/A;   PARTIAL HYSTERECTOMY  1986   POLYPECTOMY  10/08/2018   Procedure: POLYPECTOMY;  Surgeon: Mansouraty, Telford Nab., MD;  Location: [facility];  Service: Gastroenterology;;   Social History:  reports that she has never smoked. She has never used smokeless tobacco. She reports that she does not drink alcohol and does not use drugs.  No Known Allergies  Family History  Problem Relation Age of Onset   Breast cancer Mother    Heart failure Father    Other Sister        DEGEN.DISC DISEASE   Breast cancer Maternal Grandmother    Kidney cancer Neg Hx    Kidney disease Neg Hx    Prostate cancer Neg Hx  Prior to Admission medications   Medication Sig Start Date End Date Taking? Authorizing Provider  allopurinol (ZYLOPRIM) 100 MG tablet Take 100 mg by mouth daily with lunch.  04/30/16   [provider]  aspirin 81 MG EC tablet Take by mouth.    [provider]  atorvastatin (LIPITOR) 80 MG tablet Take 80 mg by mouth daily.    [provider]  Biotin 5000 MCG TABS Take 5,000 mcg by mouth daily.  HAIR/SKIN/NAILS    [provider]  carvedilol (COREG) 12.5 MG tablet Take 12.5 mg by mouth 2 (two) times daily.    [provider]  cetirizine (ZYRTEC) 10 MG tablet Take 10 mg by mouth daily. 04/20/17   [provider]  Cholecalciferol (VITAMIN D-3) 125 MCG (5000 UT) TABS Take 5,000 Units by mouth daily.    [provider]  DEXILANT 60 MG capsule Take 1 capsule by mouth daily. 02/09/20   [provider]  ferrous sulfate 325 (65 FE) MG tablet Take 325 mg by mouth daily. 12/03/19   [provider]  FLUoxetine (PROZAC) 40 MG capsule Take 40 mg by mouth daily.    [provider]  gabapentin (NEURONTIN) 400 MG capsule Take 400 mg by mouth daily. 01/14/20   [provider]  glipiZIDE (GLUCOTROL XL) 5 MG 24 hr tablet Take 5 mg by mouth 3 (three) times daily.    [provider]  hydrochlorothiazide (HYDRODIURIL) 25 MG tablet Take 25 mg by mouth daily. 02/09/20   [provider]  HYDROcodone-acetaminophen (NORCO/VICODIN) 5-325 MG tablet Take 1 tablet by mouth daily as needed (pain.).  08/11/18   [provider]  levothyroxine (SYNTHROID) 25 MCG tablet Take 25 mcg by mouth daily. 02/01/20   [provider]  lidocaine (LIDODERM) 5 % Place 1 patch onto the skin daily. Remove & Discard patch within 12 hours or as directed by MD 04/01/20   Tegeler, Gwenyth Allegra, MD  LINZESS 290 MCG CAPS capsule TAKE 1 CAPSULE BY MOUTH DAILY BEFORE BREAKFAST. 10/17/20   Jonathon Bellows, MD  LORazepam (ATIVAN) 1 MG tablet Take 1 mg by mouth 4 (four) times daily.  08/17/18   [provider]  losartan (COZAAR) 100 MG tablet Take 100 mg by mouth daily. 02/09/20   [provider]  meloxicam (MOBIC) 15 MG tablet Take 15 mg by mouth daily. Patient not taking: Reported on 03/15/2021    [provider]  metFORMIN (GLUCOPHAGE) 1000 MG tablet Take 1,000 mg by mouth 2 (two) times daily.    [provider]   methocarbamol (ROBAXIN) 750 MG tablet Take 750 mg by mouth daily with lunch. Patient not taking: Reported on 03/15/2021 05/10/16   [provider]  mirtazapine (REMERON) 15 MG tablet Take 15 mg by mouth at bedtime.  04/29/16   [provider]  omeprazole (PRILOSEC) 20 MG capsule TAKE 2 CAPSULES (40 MG TOTAL) BY MOUTH 2 (TWO) TIMES DAILY BEFORE A MEAL. 03/24/19   Mansouraty, Telford Nab., MD  oxyCODONE-acetaminophen (PERCOCET/ROXICET) 5-325 MG tablet Take 1 tablet by mouth every 4 (four) hours as needed. 04/01/20   Tegeler, Gwenyth Allegra, MD  pregabalin (LYRICA) 75 MG capsule Take 75 mg by mouth 3 (three) times daily. 02/05/20   [provider]  Probiotic Product (PROBIOTIC PO) Take 1 tablet by mouth daily.    [provider]  TOUJEO SOLOSTAR 300 UNIT/ML SOPN Inject 20 Units into the skin daily.  09/18/17   [provider]  Vibegron Logan Bores) 34  MG TABS Take 75 mg by mouth daily. 02/07/21   McGowan, Larene Beach A, PA-C  Vibegron (GEMTESA) 75 MG TABS Take 75 mg by mouth daily. 03/15/21   McGowan, Hunt Oris, PA-C  vitamin E 45 MG (100 UNITS) capsule Take by mouth.    [provider]    Physical Exam: Vitals:   05/02/21 0526 05/02/21 0528 05/02/21 0827 05/02/21 1000  BP: (!) 123/59  (!) 128/58 (!) 95/49  Pulse: 88  79 98  Resp: '15  15 18  '$ Temp:   98.5 F (36.9 C)   TempSrc:   Oral   SpO2: 95%  95% 94%  Weight:  91.5 kg     Physical Exam Vitals and nursing note reviewed.  HENT:     Head: Normocephalic and atraumatic.  Eyes:     Pupils: Pupils are equal, round, and reactive to light.  Cardiovascular:     Rate and Rhythm: Normal rate and regular rhythm.  Pulmonary:     Effort: Pulmonary effort is normal.     Breath sounds: Normal breath sounds.  Abdominal:     General: Bowel sounds are normal.     Palpations: Abdomen is soft.     Comments: Central adiposity.  Suprapubic tenderness  Musculoskeletal:        General: Normal range of motion.      Cervical back: Normal range of motion and neck supple.  Skin:    General: Skin is warm and dry.  Neurological:     General: No focal deficit present.     Mental Status: She is alert. She is disoriented.     Comments: Oriented to place and person    Data Reviewed: Relevant notes from primary care and specialist visits, past discharge summaries as available in EHR, including Care Everywhere. Prior diagnostic testing as pertinent to current admission diagnoses Updated medications and problem lists for reconciliation ED course, including vitals, labs, imaging, treatment and response to treatment Triage notes, nursing and pharmacy notes and ED provider's notes Notable results as noted in HPI Labs reviewed.  Patient noted to have pyuria.  Bicarb 19, glucose 174, creatinine 1.15, white count 7.6, hemoglobin 13.7, hematocrit 42.6, platelet count 452 X-ray reviewed by me shows no evidence of an acute cardiopulmonary disease CT scan of abdomen and pelvis shows Small hiatal hernia.Multiple bilateral simple renal cysts. Aortic atherosclerosis. Twelve-lead EKG reviewed by me shows sinus rhythm with nonspecific ST and T wave changes There are no new results to review at this time.  Assessment and Plan: * UTI (urinary tract infection) Patient presents to the ER for evaluation of nausea, vomiting and abdominal pain. 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 Renal function appears stable Monitor closely during this  hospitalization  Depression Stable Continue Remeron and Prozac  Diabetes mellitus ([place]) Patient has a history of insulin-dependent diabetes mellitus and is also on oral hypoglycemic agents We will hold long-acting insulin due to poor oral intake Hold oral hypoglycemic agents Glycemic control with sliding scale insulin Advance diet as tolerated  HTN (hypertension) We will hold carvedilol, hydrochlorothiazide and losartan due to  relative hypotension      Advance Care Planning:   Code Status: Full Code   Consults: None  Family Communication: Greater than 50% of time was spent discussing patient's condition and plan of care with her daughter Annmargaret Decaprio over the phone.  All questions and concerns have been addressed.  She verbalizes understanding and agrees with the plan.  Severity of Illness: The appropriate patient status for this patient is INPATIENT. Inpatient status is judged to be reasonable and necessary in order to provide the required intensity of service to ensure the patient's safety. The patient's presenting symptoms, physical exam findings, and initial radiographic and laboratory data in the context of their chronic comorbidities is felt to place them at high risk for further clinical deterioration. Furthermore, it is not anticipated that the patient will be medically stable for discharge from the hospital within 2 midnights of admission.   * I certify that at the point of admission it is my clinical judgment that the patient will require inpatient hospital care spanning beyond 2 midnights from the point of admission due to high intensity of service, high risk for further deterioration and high frequency of surveillance required.*  Author: Collier Bullock, MD 05/02/2021 11:29 AM  For on call review [email or web address].

## 2021-05-02 NOTE — Assessment & Plan Note (Signed)
Most likely secondary to underlying UTI ?Patient is only oriented to person and place but not to time ?Expect improvement in patient's mental status following resolution of acute infection ?

## 2021-05-02 NOTE — Assessment & Plan Note (Signed)
Stable Continue Synthroid 

## 2021-05-02 NOTE — Assessment & Plan Note (Signed)
Secondary to diabetes mellitus ?Renal function appears stable ?Monitor closely during this hospitalization ?

## 2021-05-02 NOTE — ED Notes (Signed)
Pt back from CT

## 2021-05-02 NOTE — Assessment & Plan Note (Signed)
Stable ?Continue Remeron and Prozac ?

## 2021-05-02 NOTE — Assessment & Plan Note (Signed)
Patient presents to the ER for evaluation of nausea, vomiting and abdominal pain. ?Noted to have significant pyuria ?We will treat patient empirically with Rocephin 1 g IV daily until urine culture results become available ?

## 2021-05-03 DIAGNOSIS — E039 Hypothyroidism, unspecified: Secondary | ICD-10-CM | POA: Diagnosis not present

## 2021-05-03 DIAGNOSIS — G9341 Metabolic encephalopathy: Secondary | ICD-10-CM

## 2021-05-03 DIAGNOSIS — N3000 Acute cystitis without hematuria: Secondary | ICD-10-CM | POA: Diagnosis not present

## 2021-05-03 DIAGNOSIS — E1122 Type 2 diabetes mellitus with diabetic chronic kidney disease: Secondary | ICD-10-CM | POA: Diagnosis not present

## 2021-05-03 LAB — CBC
HCT: 37.5 % (ref 36.0–46.0)
Hemoglobin: 11.8 g/dL — ABNORMAL LOW (ref 12.0–15.0)
MCH: 29.6 pg (ref 26.0–34.0)
MCHC: 31.5 g/dL (ref 30.0–36.0)
MCV: 94 fL (ref 80.0–100.0)
Platelets: 390 10*3/uL (ref 150–400)
RBC: 3.99 MIL/uL (ref 3.87–5.11)
RDW: 15.3 % (ref 11.5–15.5)
WBC: 6.7 10*3/uL (ref 4.0–10.5)
nRBC: 0 % (ref 0.0–0.2)

## 2021-05-03 LAB — BASIC METABOLIC PANEL
Anion gap: 8 (ref 5–15)
BUN: 15 mg/dL (ref 8–23)
CO2: 23 mmol/L (ref 22–32)
Calcium: 8.8 mg/dL — ABNORMAL LOW (ref 8.9–10.3)
Chloride: 109 mmol/L (ref 98–111)
Creatinine, Ser: 0.97 mg/dL (ref 0.44–1.00)
GFR, Estimated: >60 mL/min (ref >60)
Glucose, Bld: 142 mg/dL — ABNORMAL HIGH (ref 70–99)
Potassium: 3.8 mmol/L (ref 3.5–5.1)
Sodium: 140 mmol/L (ref 135–145)

## 2021-05-03 LAB — GLUCOSE, CAPILLARY
Glucose-Capillary: 138 mg/dL — ABNORMAL HIGH (ref 70–99)
Glucose-Capillary: 160 mg/dL — ABNORMAL HIGH (ref 70–99)
Glucose-Capillary: 148 mg/dL — ABNORMAL HIGH (ref 70–99)
Glucose-Capillary: 184 mg/dL — ABNORMAL HIGH (ref 70–99)

## 2021-05-03 LAB — HEMOGLOBIN A1C
Hgb A1c MFr Bld: 6.7 % — ABNORMAL HIGH (ref 4.8–5.6)
Mean Plasma Glucose: 146 mg/dL

## 2021-05-03 MED ORDER — LOSARTAN POTASSIUM 50 MG PO TABS
100.0000 mg | ORAL_TABLET | Freq: Every day | ORAL | Status: DC
  Administered 2021-05-03 – 2021-05-04 (×2): 100 mg via ORAL
  Filled 2021-05-03 (×2): qty 2

## 2021-05-03 MED ORDER — PREDNISOLONE ACETATE 1 % OP SUSP
1.0000 [drp] | Freq: Two times a day (BID) | OPHTHALMIC | Status: DC
  Administered 2021-05-03 – 2021-05-04 (×3): 1 [drp] via OPHTHALMIC
  Filled 2021-05-03: qty 1

## 2021-05-03 MED ORDER — CARVEDILOL 12.5 MG PO TABS
12.5000 mg | ORAL_TABLET | Freq: Two times a day (BID) | ORAL | Status: DC
  Administered 2021-05-03 – 2021-05-04 (×3): 12.5 mg via ORAL
  Filled 2021-05-03 (×3): qty 1

## 2021-05-03 MED ORDER — TRAZODONE HCL 50 MG PO TABS
25.0000 mg | ORAL_TABLET | Freq: Every evening | ORAL | Status: DC | PRN
  Administered 2021-05-03: 25 mg via ORAL
  Filled 2021-05-03: qty 1

## 2021-05-03 MED ORDER — RISAQUAD PO CAPS
1.0000 | ORAL_CAPSULE | Freq: Every day | ORAL | Status: DC
  Administered 2021-05-03 – 2021-05-04 (×2): 1 via ORAL
  Filled 2021-05-03 (×2): qty 1

## 2021-05-03 MED ORDER — VITAMIN D 25 MCG (1000 UNIT) PO TABS: 5000.0000 [IU] | ORAL_TABLET | Freq: Every day | ORAL | Status: DC

## 2021-05-03 MED ORDER — FLUTICASONE FUROATE-VILANTEROL 200-25 MCG/ACT IN AEPB
1.0000 | INHALATION_SPRAY | Freq: Every day | RESPIRATORY_TRACT | Status: DC
  Administered 2021-05-03 – 2021-05-04 (×2): 1 via RESPIRATORY_TRACT
  Filled 2021-05-03: qty 28

## 2021-05-03 MED ORDER — ALBUTEROL SULFATE (2.5 MG/3ML) 0.083% IN NEBU: 3.0000 mL | INHALATION_SOLUTION | RESPIRATORY_TRACT | Status: DC | PRN

## 2021-05-03 MED ORDER — ATROPINE SULFATE 1 % OP SOLN
1.0000 [drp] | Freq: Two times a day (BID) | OPHTHALMIC | Status: DC
  Administered 2021-05-03 – 2021-05-04 (×3): 1 [drp] via OPHTHALMIC
  Filled 2021-05-03: qty 2

## 2021-05-03 NOTE — TOC Progression Note (Addendum)
Transition of Care (TOC) - Progression Note  ? ? ?Patient Details  ?Name: Madeline Martinez ?MRN: [id number] ?Date of Birth: [birth year] ? ?Transition of Care (TOC) CM/SW Contact  ?Conception Oms, RN ?Phone Number: ?05/03/2021, 11:17 AM ? ?Clinical Narrative:   Patient comes from home, she is confused,  She is a patient at Pulmonary rehab [facility], [facility] to follow for any needs and assist with DC planning ? ? ? ?  ?  ? ?Expected Discharge Plan and Services ?  ?  ?  ?  ?  ?                ?  ?  ?  ?  ?  ?  ?  ?  ?  ?  ? ? ?Social Determinants of Health (SDOH) Interventions ?  ? ?Readmission Risk Interventions ?No flowsheet data found. ? ?

## 2021-05-03 NOTE — Plan of Care (Signed)
?  Problem: Education: ?Goal: Knowledge of General Education information will improve ?Description: Including pain rating scale, medication(s)/side effects and non-pharmacologic comfort measures ?Outcome: Progressing ?  ?Problem: Health Behavior/Discharge Planning: ?Goal: Ability to manage health-related needs will improve ?Outcome: Progressing ?  ?Problem: Clinical Measurements: ?Goal: Will remain free from infection ?Outcome: Progressing ?  ?Problem: Clinical Measurements: ?Goal: Diagnostic test results will improve ?Outcome: Progressing ?  ?Problem: Clinical Measurements: ?Goal: Cardiovascular complication will be avoided ?Outcome: Progressing ?  ?

## 2021-05-03 NOTE — Progress Notes (Signed)
[place] at [facility] ? ? ?PATIENT NAME: Madeline Martinez   ? ?MR#:  [id number] ? ?DATE OF BIRTH:  [birth year] ? ?SUBJECTIVE:  ?patient sitting out in the chair. She feels a lot better. Quite talkative. Daughter tells me her mother is doing very well with the amount of chatting she is doing. No fever. Tolerating liquid diet. Wants to try soft regular food. ? ?VITALS:  ?Blood pressure 130/68, pulse 75, temperature 98.4 ?F (36.9 ?C), temperature source Oral, resp. rate 20, height 5' 0.5" (1.537 m), weight 91.5 kg, SpO2 95 %. ? ?PHYSICAL EXAMINATION:  ? ?GENERAL:  76 y.o.-year-old patient lying in the bed with no acute distress. obese ?LUNGS: Normal breath sounds bilaterally, no wheezing, rales, rhonchi.  ?CARDIOVASCULAR: S1, S2 normal. No murmurs, rubs, or gallops.  ?ABDOMEN: Soft, nontender, nondistended.abdominal obesity ?EXTREMITIES: trace edema ?NEUROLOGIC: nonfocal  patient is alert and awake ?SKIN: No obvious rash, lesion, or ulcer.  ? ?LABORATORY PANEL:  ?CBC ?Recent Labs  ?Lab 05/03/21 ?2637  ?WBC 6.7  ?HGB 11.8*  ?HCT 37.5  ?PLT 390  ? ? ?Chemistries  ?Recent Labs  ?Lab 05/01/21 ?1955 05/03/21 ?0346  ?NA 135 140  ?K 3.7 3.8  ?CL 102 109  ?CO2 19* 23  ?GLUCOSE 174* 142*  ?BUN 19 15  ?CREATININE 1.15* 0.97  ?CALCIUM 9.7 8.8*  ?AST 34  --   ?ALT 41  --   ?ALKPHOS 51  --   ?BILITOT 0.5  --   ? ?Cardiac Enzymes ?No results for input(s): TROPONINI in the last 168 hours. ?RADIOLOGY:  ?DG Chest 2 View ? ?Result Date: 05/01/2021 ?CLINICAL DATA:  Increasing shortness of breath for several days EXAM: CHEST - 2 VIEW COMPARISON:  04/01/2020 FINDINGS: Cardiac shadow is stable. Linear scarring is noted in the left mid lung stable from the prior exam. No focal infiltrate or effusion is seen. Degenerative changes of the thoracic spine are noted. Stable nodule is noted in the right lower lobe unchanged from prior CT examination. IMPRESSION: No acute abnormality noted.  Chronic changes as described  above. Electronically Signed   By: Inez Catalina M.D.   On: 05/01/2021 20:41  ? ?CT Abdomen Pelvis W Contrast ? ?Result Date: 05/02/2021 ?CLINICAL DATA:  Shortness of breath and abdominal pain. EXAM: CT ABDOMEN AND PELVIS WITH CONTRAST TECHNIQUE: Multidetector CT imaging of the abdomen and pelvis was performed using the standard protocol following bolus administration of intravenous contrast. RADIATION DOSE REDUCTION: This exam was performed according to the departmental dose-optimization program which includes automated exposure control, adjustment of the mA and/or kV according to patient size and/or use of iterative reconstruction technique. CONTRAST:  1103m OMNIPAQUE IOHEXOL 300 MG/ML  SOLN COMPARISON:  April 01, 2020 FINDINGS: Lower chest: No acute abnormality. Hepatobiliary: A 4 mm focus of parenchymal low attenuation is seen within the posterior aspect of the left lobe of the liver. This is too small to characterize by CT examination. No gallstones, gallbladder wall thickening, or biliary dilatation. Pancreas: Unremarkable. No pancreatic ductal dilatation or surrounding inflammatory changes. Spleen: Normal in size without focal abnormality. Adrenals/Urinary Tract: Adrenal glands are unremarkable. Kidneys are normal in size, without renal calculi or hydronephrosis. There are multiple bilateral simple renal cysts. The largest is seen within the posterior aspect of the mid right kidney and measures 6.2 cm in diameter. The urinary bladder is poorly distended and subsequently limited in evaluation. Stomach/Bowel: There is a small hiatal hernia. Appendix appears normal. No evidence of bowel wall thickening, distention,  or inflammatory changes. Vascular/Lymphatic: Aortic atherosclerosis. No enlarged abdominal or pelvic lymph nodes. Reproductive: Status post hysterectomy. No adnexal masses. Other: No abdominal wall hernia or abnormality. No abdominopelvic ascites. 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 Degenerative changes are seen throughout the lumbar spine. IMPRESSION: 1. Small hiatal hernia. 2. Multiple bilateral simple renal cysts. 3. Aortic atherosclerosis. Aortic Atherosclerosis (ICD10-I70.0). Electronically Signed   By: Virgina Norfolk M.D.   On: 05/02/2021 02:33   ? ?Assessment and Plan ?Madeline Martinez is a 76 y.o. female with medical history significant for GERD, depression, diabetes mellitus, hypertension, dyslipidemia who presents to the ER for evaluation of abdominal pain associated with nausea, vomiting, diarrhea and shortness of breath. ? ?Acute metabolic encephalopathy likely secondary to UTI and AK I on CKD stage IIIa with gastroenteritis ?-- urine culture positive for Klebsiella ?-- received IV fluids ?-- tolerating PO diet advance to soft/regular food ?-- IV Rocephin ?-- patient encouraged to drink fluids]\ ?-- came in with creatinine of 1.15--- .9 ?-- normal white count ?-- normal lactic acid ? ?Klebsiella UTI ?-- as above ? ?anxiety/depression ?-- continue Remeron, Prozac, as needed lorazepam ? ?Hypothyroidism ?-- Synthroid ? ?type II diabetes with CKD ?-- sliding scale insulin ?-- A1c 6.7 ?-- patient content continue oral hypoglycemic agents at home will hold off on insulin at discharge ? ?Hypertension ?-- resume Coreg and losartan ? ?anticipate discharge tomorrow ?physical therapy to see patient ?discussed with daughter in the room ? ? ? ? ? ?Procedures: ?Family communication :dter at bedside ?Consults :none ?CODE STATUS: full ?DVT Prophylaxis :Enoxaparin ?Level of care: Med-Surg ?Status is: Inpatient ?Remains inpatient appropriate because: UTI, weakness, encephalopathy ? ? ?TOTAL TIME TAKING CARE OF THIS PATIENT: 25 minutes.  ?>50% time spent on counselling and coordination of care ? ?Note: This dictation was prepared with Dragon dictation along with smaller phrase technology. Any transcriptional errors that result from this process  are unintentional. ? ?Fritzi Mandes M.D  ? ? ?[facility]  ? ?CC: ?Primary care physician; [name], MD  ?

## 2021-05-04 DIAGNOSIS — G9341 Metabolic encephalopathy: Secondary | ICD-10-CM | POA: Diagnosis not present

## 2021-05-04 DIAGNOSIS — I10 Essential (primary) hypertension: Secondary | ICD-10-CM | POA: Diagnosis not present

## 2021-05-04 DIAGNOSIS — N3000 Acute cystitis without hematuria: Secondary | ICD-10-CM | POA: Diagnosis not present

## 2021-05-04 DIAGNOSIS — E1122 Type 2 diabetes mellitus with diabetic chronic kidney disease: Secondary | ICD-10-CM | POA: Diagnosis not present

## 2021-05-04 LAB — URINE CULTURE: Culture: >=100000 — AB

## 2021-05-04 LAB — GLUCOSE, CAPILLARY: Glucose-Capillary: 160 mg/dL — ABNORMAL HIGH (ref 70–99)

## 2021-05-04 MED ORDER — TRESIBA FLEXTOUCH 100 UNIT/ML ~~LOC~~ SOPN: 15.0000 [IU] | PEN_INJECTOR | Freq: Every day | SUBCUTANEOUS | 0 refills | Status: DC

## 2021-05-04 MED ORDER — CEPHALEXIN 500 MG PO CAPS
500.0000 mg | ORAL_CAPSULE | Freq: Two times a day (BID) | ORAL | Status: DC
  Administered 2021-05-04: 09:00:00 500 mg via ORAL
  Filled 2021-05-04: qty 1

## 2021-05-04 MED ORDER — CEPHALEXIN 500 MG PO CAPS: 500.0000 mg | ORAL_CAPSULE | Freq: Two times a day (BID) | ORAL | 0 refills | Status: AC

## 2021-05-04 MED ORDER — GLIPIZIDE ER 5 MG PO TB24: 5.0000 mg | ORAL_TABLET | Freq: Every day | ORAL | 0 refills | Status: DC

## 2021-05-04 MED ORDER — TRESIBA FLEXTOUCH 100 UNIT/ML ~~LOC~~ SOPN
10.0000 [IU] | PEN_INJECTOR | Freq: Every day | SUBCUTANEOUS | 0 refills | Status: DC
Start: 2021-05-04 — End: 2021-05-04

## 2021-05-04 NOTE — Evaluation (Signed)
Physical Therapy Evaluation ?Patient Details ?Name: Madeline Martinez ?MRN: [id number] ?DOB: May 20, 1945 ?Today's Date: 05/04/2021 ? ?History of Present Illness ? Madeline Martinez is a 76 y.o. female who presents to the ED from home with a chief complaint of abdominal pain, nausea, vomiting, diarrhea and shortness of breath. PMH includes asthma, HTN, diabetes. ?  ?Clinical Impression ? Pt admitted with above diagnosis. Pt received in recliner agreeable to PT services. Pt appears to have a hard time with memory so unsure on reliability of pt being good historian. Pt reports at baseline she ambulates in home without AD but relies on the walls and furniture for stability. Pt reports use of manual W/c outside home for appts and relies on daughter for assistance in ADL's/IADL's and for transportation.  Pt able to perform STS to RW with supervision with safe hand placement and ambulate within room at slow but steady cadence with supervision. PT is blind in R eye so does rely on min VC"s and education for scanning unfamiliar environments. Pt returned to recliner with safe RW sequencing with min VC's for hand placement with descent but otherwise has safe understanding of transfers and performing short household distances with AD. Pt educated on benefits of RW at this time to reduce risk of falls versus furniture walking since pt does spend part of her day alone. Pt educated on scanning techniques due to visual deficits in R eye and for energy conservation techniques to improve safe OOB mobility to reduce risk of falls. All needs in reach. Pt safe to d/c home with similar assist levels from daughter. Pt currently with functional limitations due to the deficits listed below (see PT Problem List). Pt will benefit from skilled PT to increase their independence and safety with mobility to allow discharge to the venue listed below.    ? ?Recommendations for follow up therapy are one component of a multi-disciplinary discharge planning  process, led by the attending physician.  Recommendations may be updated based on patient status, additional functional criteria and insurance authorization. ? ?Follow Up Recommendations Home health PT ? ?  ?Assistance Recommended at Discharge Intermittent Supervision/Assistance  ?Patient can return home with the following ? Assistance with cooking/housework;Assist for transportation;Help with stairs or ramp for entrance;Direct supervision/assist for medications management;Direct supervision/assist for financial management ? ?  ?Equipment Recommendations None recommended by PT  ?Recommendations for Other Services ?    ?  ?Functional Status Assessment Patient has had a recent decline in their functional status and demonstrates the ability to make significant improvements in function in a reasonable and predictable amount of time.  ? ?  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 ?  ? ?Mobility ? Bed Mobility ?  ?  ?  ?  ?  ?  ?  ?General bed mobility comments: NT. In recliner pre/post session ?Patient Response: Cooperative ? ?Transfers ?Overall transfer level: Needs assistance ?Equipment used: Rolling walker (2 wheels) ?Transfers: Sit to/from Stand ?Sit to Stand: Supervision ?  ?  ?  ?  ?  ?General transfer comment: Correct use of hands. Increased time. ?  ? ?Ambulation/Gait ?Ambulation/Gait assistance: Supervision ?Gait Distance (Feet): 20 Feet ?Assistive device: Rolling walker (2 wheels) ?Gait Pattern/deviations: Step-to pattern, Wide base of support, Decreased step length - right, Decreased step length - left ?  ?  ?  ?General Gait Details: Pt displaying very slow cadence but is stable throughout without balance impairments noted with RW ? ?Stairs ?  ?  ?  ?  ?  ? ?  Wheelchair Mobility ?  ? ?Modified Rankin (Stroke Patients Only) ?  ? ?  ? ?Balance Overall balance assessment: Needs assistance ?Sitting-balance  support: Bilateral upper extremity supported, Feet supported ?Sitting balance-Leahy Scale: Fair ?  ?  ?  ?Standing balance-Leahy Scale: Fair ?  ?  ?  ?  ?  ?  ?  ?  ?  ?  ?  ?  ?   ? ? ? ?Pertinent Vitals/Pain Pain Assessment ?Pain Assessment: No/denies pain  ? ? ?Home Living Family/patient expects to be discharged to:: Private residence ?Living Arrangements: Children ?Available Help at Discharge: Family;Available 24 hours/day ?Type of Home: House ?Home Access: Level entry (from garage) ?  ?  ?Alternate Level Stairs-Number of Steps: flight ?Home Layout: Two level;Able to live on main level with bedroom/bathroom ?Home Equipment: Conservation officer, nature (2 wheels);Wheelchair - manual ?   ?  ?Prior Function Prior Level of Function : Needs assist ?  ?  ?  ?Physical Assist : Mobility (physical) ?Mobility (physical): Bed mobility;Transfers;Gait ?  ?Mobility Comments: Use of furniture, intermittent assist from daughter ?ADLs Comments: Daughter assists with all ADL's per pt ?  ? ? ?Hand Dominance  ?   ? ?  ?Extremity/Trunk Assessment  ? Upper Extremity Assessment ?Upper Extremity Assessment: Generalized weakness ?  ? ?Lower Extremity Assessment ?Lower Extremity Assessment: Generalized weakness ?  ? ?   ?Communication  ? Communication: No difficulties  ?Cognition Arousal/Alertness: Awake/alert ?Behavior During Therapy: [facility] for tasks assessed/performed ?Overall Cognitive Status: No family/caregiver present to determine baseline cognitive functioning ?  ?  ?  ?  ?  ?  ?  ?  ?  ?  ?  ?  ?  ?  ?  ?  ?General Comments: Requires extra time to process, seems to have difficulty recalling information. Limited historian. ?  ?  ? ?  ?General Comments   ? ?  ?Exercises Other Exercises ?Other Exercises: Role of PT in acute setting, d/c recs, energy conservation, need for AD to reduce risk of falls due to being home alone during parts of the day. Education on increased risk of falls relying on furniture for stability.  ? ?Assessment/Plan  ?  ?PT  Assessment Patient needs continued PT services  ?PT Problem List Decreased strength;Decreased mobility;Obesity;Decreased activity tolerance ? ?   ?  ?PT Treatment Interventions DME instruction;Therapeutic exercise;Gait training;Balance training;Stair training;Neuromuscular re-education;Functional mobility training;Therapeutic activities;Patient/family education   ? ?PT Goals (Current goals can be found in the Care Plan section)  ?Acute Rehab PT Goals ?Patient Stated Goal: to go home and improve her mobility ?PT Goal Formulation: With patient ?Time For Goal Achievement: 05/11/21 ?Potential to Achieve Goals: Good ? ?  ?Frequency Min 2X/week ?  ? ? ?Co-evaluation   ?  ?  ?  ?  ? ? ?  ?AM-PAC PT "6 Clicks" Mobility  ?Outcome Measure Help needed turning from your back to your side while in a flat bed without using bedrails?: A Little ?Help needed moving from lying on your back to sitting on the side of a flat bed without using bedrails?: A Little ?Help needed moving to and from a bed to a chair (including a wheelchair)?: A Little ?Help needed standing up from a chair using your arms (e.g., wheelchair or bedside chair)?: A Little ?Help needed to walk in hospital room?: A Little ?Help needed climbing 3-5 steps with a railing? : A Lot ?6 Click Score: 17 ? ?  ?End of Session Equipment Utilized During Treatment: Gait belt ?  Activity Tolerance: Patient tolerated treatment well ?Patient left: in chair;with call bell/phone within reach;with chair alarm set ?Nurse Communication: Mobility status ?PT Visit Diagnosis: Other abnormalities of gait and mobility (R26.89);Muscle weakness (generalized) (M62.81) ?  ? ?Time: [id number] ?PT Time Calculation (min) (ACUTE ONLY): 29 min ? ? ?Charges:   PT Evaluation ?$PT Eval Moderate Complexity: 1 Mod ?PT Treatments ?$Therapeutic Activity: 23-37 mins ?  ?   ? ? ?Salem Caster. Fairly IV, PT, DPT ?Physical Therapist- [place]  ?[facility]  ?05/04/2021, 10:49 AM ? ?

## 2021-05-04 NOTE — TOC Progression Note (Signed)
Transition of Care (TOC) - Progression Note  ? ? ?Patient Details  ?Name: Madeline Martinez ?MRN: [id number] ?Date of Birth: [birth year] ? ?Transition of Care (TOC) CM/SW Contact  ?Conception Oms, RN ?Phone Number: ?05/04/2021, 9:14 AM ? ?Clinical Narrative:   Met with the patient in the room, she is alert and oriented, Her daughter [name] lives with her and provides transportation, She has a RW and wheelchair at home, She also has a high rise toilet, She has done outpatient PT at [facility] in the past but it was too much to be able to get out to do that with her daughter taking her, She is agreeable to [facility] PT, I notified [facility] and requested them to accept the patient,  She stated that she doe snot have any additional needs ? ? ? ?Expected Discharge Plan: Home/Self Care ?Barriers to Discharge: Continued Medical Work up ? ?Expected Discharge Plan and Services ?Expected Discharge Plan: Home/Self Care ?  ?  ?  ?  ?                ?  ?  ?  ?  ?  ?  ?  ?  ?  ?  ? ? ?Social Determinants of Health (SDOH) Interventions ?  ? ?Readmission Risk Interventions ?No flowsheet data found. ? ?

## 2021-05-04 NOTE — Progress Notes (Signed)
Patient was given verbal and written discharge instructions, she acknowledge understanding, and states she will comply with all appointments. Taken to car by wheelchair. No distress when leaving the floor. ?

## 2021-05-04 NOTE — Discharge Summary (Signed)
Physician Discharge Summary   Patient: Madeline Martinez MRN: [id number] DOB: Jul 05, 1945  Admit date:     05/02/2021  Discharge date: 05/04/21  Discharge Physician: Fritzi Mandes   PCP: [name], MD   Recommendations at discharge:   follow-up Dr. Rosario Jacks in 1 to 2 week  Discharge Diagnoses: Klebisella UTI acute renal failure CKD stage II a acute metabolic encephalopathy-- resolved  Hospital Course:  Madeline Martinez is a 76 y.o. female with medical history significant for GERD, depression, diabetes mellitus, hypertension, dyslipidemia who presents to the ER for evaluation of abdominal pain associated with nausea, vomiting, diarrhea and shortness of breath.   Acute metabolic encephalopathy likely secondary to UTI and AK I on CKD stage IIIa with gastroenteritis -- urine culture positive for Klebsiella -- received IV fluids -- tolerating PO diet advance to soft/regular food -- IV Rocephin--change to po Keflex -- patient encouraged to drink fluids -- came in with creatinine of 1.15--->0.9 -- normal white count -- normal lactic acid   Klebsiella UTI -- as above   anxiety/depression -- continue Remeron, Prozac, as needed lorazepam   Hypothyroidism -- Synthroid   type II diabetes with CKD -- sliding scale insulin -- A1c 6.7 -- cont metformin, Tresiba dose decreased to 15 units, decreased glipizide to 5 mg bid (was on TID)  --defer to PCP to adjust according to sugars at home  Hypertension -- resume Coreg and losartan  Overall stable D/c home with HHPT      Consultants: none Procedures performed: none  Disposition: Home health Diet recommendation:  Discharge Diet Orders (From admission, onward)     Start     Ordered   05/04/21 0000  Diet - low sodium heart healthy        05/04/21 0918   05/04/21 0000  Diet Carb Modified        05/04/21 0918           Cardiac and Carb modified diet DISCHARGE MEDICATION: Allergies as of 05/04/2021   No Known Allergies       Medication List     STOP taking these medications    hydrochlorothiazide 25 MG tablet Commonly known as: HYDRODIURIL       TAKE these medications    Advair HFA 115-21 MCG/ACT inhaler Generic drug: fluticasone-salmeterol Inhale 2 puffs into the lungs 2 (two) times daily.   albuterol 108 (90 Base) MCG/ACT inhaler Commonly known as: VENTOLIN HFA Inhale 2 puffs into the lungs every 4 (four) hours as needed for wheezing or shortness of breath.   allopurinol 100 MG tablet Commonly known as: ZYLOPRIM Take 100 mg by mouth daily with lunch.   ascorbic acid 1000 MG tablet Commonly known as: VITAMIN C Take 1,000 mg by mouth 3 (three) times daily.   aspirin 81 MG EC tablet Take 81 mg by mouth daily.   atorvastatin 80 MG tablet Commonly known as: LIPITOR Take 80 mg by mouth daily.   atropine 1 % ophthalmic solution Place 1 drop into the right eye 2 (two) times daily.   Biotin 5000 MCG Tabs Take 5,000 mcg by mouth daily. HAIR/SKIN/NAILS   carvedilol 12.5 MG tablet Commonly known as: COREG Take 12.5 mg by mouth 2 (two) times daily.   cephALEXin 500 MG capsule Commonly known as: KEFLEX Take 1 capsule (500 mg total) by mouth every 12 (twelve) hours for 4 days.   cetirizine 10 MG tablet Commonly known as: ZYRTEC Take 10 mg by mouth daily.   Dupixent 300 MG/2ML Sopn Generic drug:  Dupilumab Inject 300 mg into the skin every 14 (fourteen) days.   ferrous sulfate 325 (65 FE) MG tablet Take 325 mg by mouth daily.   FLUoxetine 40 MG capsule Commonly known as: PROZAC Take 40 mg by mouth daily.   Gemtesa 75 MG Tabs Generic drug: Vibegron Take 75 mg by mouth daily.   glipiZIDE 5 MG 24 hr tablet Commonly known as: GLUCOTROL XL Take 1 tablet (5 mg total) by mouth daily. What changed: when to take this   HYDROcodone-acetaminophen 5-325 MG tablet Commonly known as: NORCO/VICODIN Take 1 tablet by mouth daily as needed (pain.).   levothyroxine 25 MCG  tablet Commonly known as: SYNTHROID Take 25 mcg by mouth daily.   Linzess 290 MCG Caps capsule Generic drug: linaclotide TAKE 1 CAPSULE BY MOUTH DAILY BEFORE BREAKFAST.   LORazepam 1 MG tablet Commonly known as: ATIVAN Take 1 mg by mouth 4 (four) times daily.   losartan 100 MG tablet Commonly known as: COZAAR Take 100 mg by mouth daily.   metFORMIN 1000 MG tablet Commonly known as: GLUCOPHAGE Take 1,000 mg by mouth 2 (two) times daily.   prednisoLONE acetate 1 % ophthalmic suspension Commonly known as: PRED FORTE Place 1 drop into the right eye 2 (two) times daily.   pregabalin 75 MG capsule Commonly known as: LYRICA Take 75 mg by mouth 3 (three) times daily.   PROBIOTIC PO Take 1 tablet by mouth daily.   Tyler Aas FlexTouch 100 UNIT/ML FlexTouch Pen Generic drug: insulin degludec Inject 15 Units into the skin daily. What changed: how much to take   Vitamin D-3 125 MCG (5000 UT) Tabs Take 5,000 Units by mouth daily.   vitamin E 45 MG (100 UNITS) capsule Take by mouth.        Follow-up Information     [name], MD. Schedule an appointment as soon as possible for a visit in 1 week(s).   Specialty: Internal Medicine Why: hospital f/u Contact information: [place] [address] [phone]                Discharge Exam: [name] Weights   05/02/21 0528  Weight: 91.5 kg     Condition at discharge: fair  The results of significant diagnostics from this hospitalization (including imaging, microbiology, ancillary and laboratory) are listed below for reference.   Imaging Studies: DG Chest 2 View  Result Date: 05/01/2021 CLINICAL DATA:  Increasing shortness of breath for several days EXAM: CHEST - 2 VIEW COMPARISON:  04/01/2020 FINDINGS: Cardiac shadow is stable. Linear scarring is noted in the left mid lung stable from the prior exam. No focal infiltrate or effusion is seen. Degenerative changes of the thoracic spine are  noted. Stable nodule is noted in the right lower lobe unchanged from prior CT examination. IMPRESSION: No acute abnormality noted.  Chronic changes as described above. Electronically Signed   By: Inez Catalina M.D.   On: 05/01/2021 20:41   CT Abdomen Pelvis W Contrast  Result Date: 05/02/2021 CLINICAL DATA:  Shortness of breath and abdominal pain. EXAM: CT ABDOMEN AND PELVIS WITH CONTRAST TECHNIQUE: Multidetector CT imaging of the abdomen and pelvis was performed using the standard protocol following bolus administration of intravenous contrast. RADIATION DOSE REDUCTION: This exam was performed according to the departmental dose-optimization program which includes automated exposure control, adjustment of the mA and/or kV according to patient size and/or use of iterative reconstruction technique. CONTRAST:  136m OMNIPAQUE IOHEXOL 300 MG/ML  SOLN COMPARISON:  April 01, 2020 FINDINGS: Lower chest: No acute abnormality. Hepatobiliary: A 4 mm focus of parenchymal low attenuation is seen within the posterior aspect of the left lobe of the liver. This is too small to characterize by CT examination. No gallstones, gallbladder wall thickening, or biliary dilatation. Pancreas: Unremarkable. No pancreatic ductal dilatation or surrounding inflammatory changes. Spleen: Normal in size without focal abnormality. Adrenals/Urinary Tract: Adrenal glands are unremarkable. Kidneys are normal in size, without renal calculi or hydronephrosis. There are multiple bilateral simple renal cysts. The largest is seen within the posterior aspect of the mid right kidney and measures 6.2 cm in diameter. The urinary bladder is poorly distended and subsequently limited in evaluation. Stomach/Bowel: There is a small hiatal hernia. Appendix appears normal. No evidence of bowel wall thickening, distention, or inflammatory changes. Vascular/Lymphatic: Aortic atherosclerosis. No enlarged abdominal or pelvic lymph nodes. Reproductive: Status post  hysterectomy. No adnexal masses. Other: No abdominal wall hernia or abnormality. No abdominopelvic ascites. 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 Degenerative changes are seen throughout the lumbar spine. IMPRESSION: 1. Small hiatal hernia. 2. Multiple bilateral simple renal cysts. 3. Aortic atherosclerosis. Aortic Atherosclerosis (ICD10-I70.0). Electronically Signed   By: Virgina Norfolk M.D.   On: 05/02/2021 02:33    Microbiology: Results for orders placed or performed during the hospital encounter of 05/02/21  Urine Culture     Status: Abnormal   Collection Time: 05/02/21 12:55 AM   Specimen: Urine, Clean Catch  Result Value Ref Range Status   Specimen Description   Final    URINE, CLEAN CATCH Performed at [facility], [address] [place], [place] [address]    Special Requests   Final    NONE Performed at [facility], [address] [place], [place] [address]    Culture >=100,000 COLONIES/mL KLEBSIELLA PNEUMONIAE (A)  Final   Report Status 05/04/2021 FINAL  Final   Organism ID, Bacteria KLEBSIELLA PNEUMONIAE (A)  Final      Susceptibility   Klebsiella pneumoniae - MIC*    AMPICILLIN >=32 RESISTANT Resistant     CEFAZOLIN <=4 SENSITIVE Sensitive     CEFEPIME <=0.12 SENSITIVE Sensitive     CEFTRIAXONE <=0.25 SENSITIVE Sensitive     CIPROFLOXACIN <=0.25 SENSITIVE Sensitive     GENTAMICIN <=1 SENSITIVE Sensitive     IMIPENEM <=0.25 SENSITIVE Sensitive     NITROFURANTOIN 64 INTERMEDIATE Intermediate     TRIMETH/SULFA <=20 SENSITIVE Sensitive     AMPICILLIN/SULBACTAM 8 SENSITIVE Sensitive     PIP/TAZO <=4 SENSITIVE Sensitive     * >=100,000 COLONIES/mL KLEBSIELLA PNEUMONIAE  Culture, blood (routine x 2)     Status: None (Preliminary result)   Collection Time: 05/02/21  2:01 AM   Specimen: BLOOD  Result Value Ref Range Status   Specimen Description BLOOD RIGHT [facility]  Final   Special  Requests   Final    BOTTLES DRAWN AEROBIC AND ANAEROBIC Blood Culture adequate volume   Culture   Final    NO GROWTH 2 DAYS Performed at [facility], [address], [place], [place] [address]    Report Status PENDING  Incomplete  Culture, blood (routine x 2)     Status: None (Preliminary result)   Collection Time: 05/02/21  2:01 AM   Specimen: BLOOD  Result Value Ref Range Status   Specimen Description BLOOD LEFT [facility]  Final   Special Requests   Final    BOTTLES DRAWN AEROBIC AND ANAEROBIC Blood  Culture results may not be optimal due to an excessive volume of blood received in culture bottles   Culture   Final    NO GROWTH 2 DAYS Performed at [facility], [address] [place], [place] [address]    Report Status PENDING  Incomplete  Resp Panel by RT-PCR (Flu A&B, Covid) Nasopharyngeal Swab     Status: None   Collection Time: 05/02/21  2:01 AM   Specimen: Nasopharyngeal Swab; Nasopharyngeal(NP) swabs in vial transport medium  Result Value Ref Range Status   SARS Coronavirus 2 by RT PCR NEGATIVE NEGATIVE Final    Comment: (NOTE) SARS-CoV-2 target nucleic acids are NOT DETECTED.  The SARS-CoV-2 RNA is generally detectable in upper respiratory specimens during the acute phase of infection. The lowest concentration of SARS-CoV-2 viral copies this assay can detect is 138 copies/mL. A negative result does not preclude SARS-Cov-2 infection and should not be used as the sole basis for treatment or other patient management decisions. A negative result may occur with  improper specimen collection/handling, submission of specimen other than nasopharyngeal swab, presence of viral mutation(s) within the areas targeted by this assay, and inadequate number of viral copies(<138 copies/mL). A negative result must be combined with clinical observations, patient history, and epidemiological information. The expected result is Negative.  Fact Sheet for Patients:   EntrepreneurPulse.com.au  Fact Sheet for Healthcare Providers:  IncredibleEmployment.be  This test is no t yet approved or cleared by the [place] FDA and  has been authorized for detection and/or diagnosis of SARS-CoV-2 by FDA under an Emergency Use Authorization (EUA). This EUA will remain  in effect (meaning this test can be used) for the duration of the COVID-19 declaration under Section 564(b)(1) of the Act, 21 U.S.C.section 360bbb-3(b)(1), unless the authorization is terminated  or revoked sooner.       Influenza A by PCR NEGATIVE NEGATIVE Final   Influenza B by PCR NEGATIVE NEGATIVE Final    Comment: (NOTE) The Xpert Xpress SARS-CoV-2/FLU/RSV plus assay is intended as an aid in the diagnosis of influenza from Nasopharyngeal swab specimens and should not be used as a sole basis for treatment. Nasal washings and aspirates are unacceptable for Xpert Xpress SARS-CoV-2/FLU/RSV testing.  Fact Sheet for Patients: EntrepreneurPulse.com.au  Fact Sheet for Healthcare Providers: IncredibleEmployment.be  This test is not yet approved or cleared by the [place] FDA and has been authorized for detection and/or diagnosis of SARS-CoV-2 by FDA under an Emergency Use Authorization (EUA). This EUA will remain in effect (meaning this test can be used) for the duration of the COVID-19 declaration under Section 564(b)(1) of the Act, 21 U.S.C. section 360bbb-3(b)(1), unless the authorization is terminated or revoked.  Performed at [facility], [address] [place], [place] [address]     Labs: CBC: Recent Labs  Lab 05/01/21 1955 05/03/21 0346  WBC 7.6 6.7  HGB 13.7 11.8*  HCT 42.6 37.5  MCV 92.2 94.0  PLT 452* 341   Basic Metabolic Panel: Recent Labs  Lab 05/01/21 1955 05/03/21 0346  NA 135 140  K 3.7 3.8  CL 102 109  CO2 19* 23  GLUCOSE 174* 142*  BUN 19 15  CREATININE  1.15* 0.97  CALCIUM 9.7 8.8*   Liver Function Tests: Recent Labs  Lab 05/01/21 1955  AST 34  ALT 41  ALKPHOS 51  BILITOT 0.5  PROT 8.2*  ALBUMIN 4.0   CBG: Recent Labs  Lab 05/03/21 0744 05/03/21 1153 05/03/21 1631 05/03/21 2130 05/04/21 9622  GLUCAP 138* 160* 148* 184* 160*    Discharge time spent: greater than 30 minutes.  Signed: Fritzi Mandes, MD [facility] 05/04/2021

## 2021-05-07 LAB — CULTURE, BLOOD (ROUTINE X 2)
Culture: NO GROWTH
Culture: NO GROWTH
Special Requests: ADEQUATE

## 2021-05-17 ENCOUNTER — Emergency Department: Payer: Medicare HMO

## 2021-05-17 ENCOUNTER — Inpatient Hospital Stay
Admission: EM | Admit: 2021-05-17 | Discharge: 2021-05-20 | DRG: 640 | Disposition: A | Discharge status: Home Health | Payer: Medicare HMO | Attending: Internal Medicine | Admitting: Internal Medicine

## 2021-05-17 ENCOUNTER — Encounter: Payer: Self-pay | Admitting: Internal Medicine

## 2021-05-17 ENCOUNTER — Observation Stay: Payer: Medicare HMO

## 2021-05-17 ENCOUNTER — Other Ambulatory Visit: Payer: Self-pay

## 2021-05-17 DIAGNOSIS — E1159 Type 2 diabetes mellitus with other circulatory complications: Secondary | ICD-10-CM | POA: Diagnosis present

## 2021-05-17 DIAGNOSIS — Z7982 Long term (current) use of aspirin: Secondary | ICD-10-CM

## 2021-05-17 DIAGNOSIS — N179 Acute kidney failure, unspecified: Secondary | ICD-10-CM | POA: Diagnosis not present

## 2021-05-17 DIAGNOSIS — Z794 Long term (current) use of insulin: Secondary | ICD-10-CM

## 2021-05-17 DIAGNOSIS — E782 Mixed hyperlipidemia: Secondary | ICD-10-CM | POA: Diagnosis present

## 2021-05-17 DIAGNOSIS — E039 Hypothyroidism, unspecified: Secondary | ICD-10-CM | POA: Diagnosis present

## 2021-05-17 DIAGNOSIS — R2681 Unsteadiness on feet: Secondary | ICD-10-CM | POA: Diagnosis present

## 2021-05-17 DIAGNOSIS — Z7984 Long term (current) use of oral hypoglycemic drugs: Secondary | ICD-10-CM

## 2021-05-17 DIAGNOSIS — I10 Essential (primary) hypertension: Secondary | ICD-10-CM | POA: Diagnosis present

## 2021-05-17 DIAGNOSIS — E11649 Type 2 diabetes mellitus with hypoglycemia without coma: Secondary | ICD-10-CM | POA: Diagnosis present

## 2021-05-17 DIAGNOSIS — M109 Gout, unspecified: Secondary | ICD-10-CM | POA: Diagnosis present

## 2021-05-17 DIAGNOSIS — R296 Repeated falls: Secondary | ICD-10-CM | POA: Diagnosis present

## 2021-05-17 DIAGNOSIS — E1122 Type 2 diabetes mellitus with diabetic chronic kidney disease: Secondary | ICD-10-CM | POA: Diagnosis present

## 2021-05-17 DIAGNOSIS — I129 Hypertensive chronic kidney disease with stage 1 through stage 4 chronic kidney disease, or unspecified chronic kidney disease: Secondary | ICD-10-CM | POA: Diagnosis present

## 2021-05-17 DIAGNOSIS — Z803 Family history of malignant neoplasm of breast: Secondary | ICD-10-CM

## 2021-05-17 DIAGNOSIS — E119 Type 2 diabetes mellitus without complications: Secondary | ICD-10-CM

## 2021-05-17 DIAGNOSIS — Z20822 Contact with and (suspected) exposure to covid-19: Secondary | ICD-10-CM | POA: Diagnosis present

## 2021-05-17 DIAGNOSIS — J4540 Moderate persistent asthma, uncomplicated: Secondary | ICD-10-CM | POA: Diagnosis present

## 2021-05-17 DIAGNOSIS — N1831 Acute kidney failure, unspecified: Secondary | ICD-10-CM | POA: Diagnosis present

## 2021-05-17 DIAGNOSIS — E869 Volume depletion, unspecified: Principal | ICD-10-CM | POA: Diagnosis present

## 2021-05-17 DIAGNOSIS — Z79890 Hormone replacement therapy (postmenopausal): Secondary | ICD-10-CM

## 2021-05-17 DIAGNOSIS — G9341 Metabolic encephalopathy: Secondary | ICD-10-CM | POA: Diagnosis present

## 2021-05-17 DIAGNOSIS — E1169 Type 2 diabetes mellitus with other specified complication: Secondary | ICD-10-CM | POA: Diagnosis present

## 2021-05-17 DIAGNOSIS — Y92009 Unspecified place in unspecified non-institutional (private) residence as the place of occurrence of the external cause: Secondary | ICD-10-CM

## 2021-05-17 DIAGNOSIS — E162 Hypoglycemia, unspecified: Secondary | ICD-10-CM | POA: Diagnosis present

## 2021-05-17 DIAGNOSIS — R4182 Altered mental status, unspecified: Principal | ICD-10-CM

## 2021-05-17 DIAGNOSIS — W19XXXA Unspecified fall, initial encounter: Secondary | ICD-10-CM

## 2021-05-17 DIAGNOSIS — K219 Gastro-esophageal reflux disease without esophagitis: Secondary | ICD-10-CM | POA: Diagnosis present

## 2021-05-17 DIAGNOSIS — Z8249 Family history of ischemic heart disease and other diseases of the circulatory system: Secondary | ICD-10-CM

## 2021-05-17 DIAGNOSIS — G4730 Sleep apnea, unspecified: Secondary | ICD-10-CM | POA: Diagnosis present

## 2021-05-17 LAB — RESP PANEL BY RT-PCR (FLU A&B, COVID) ARPGX2
Influenza A by PCR: NEGATIVE
Influenza B by PCR: NEGATIVE
SARS Coronavirus 2 by RT PCR: NEGATIVE

## 2021-05-17 LAB — APTT: aPTT: 30 seconds (ref 24–36)

## 2021-05-17 LAB — CBC
HCT: 37 % (ref 36.0–46.0)
Hemoglobin: 11.3 g/dL — ABNORMAL LOW (ref 12.0–15.0)
MCH: 29.7 pg (ref 26.0–34.0)
MCHC: 30.5 g/dL (ref 30.0–36.0)
MCV: 97.4 fL (ref 80.0–100.0)
Platelets: 353 10*3/uL (ref 150–400)
RBC: 3.8 MIL/uL — ABNORMAL LOW (ref 3.87–5.11)
RDW: 15.3 % (ref 11.5–15.5)
WBC: 7.8 10*3/uL (ref 4.0–10.5)
nRBC: 0 % (ref 0.0–0.2)

## 2021-05-17 LAB — PROTIME-INR
INR: 1 (ref 0.8–1.2)
Prothrombin Time: 13.1 seconds (ref 11.4–15.2)

## 2021-05-17 LAB — BASIC METABOLIC PANEL
Anion gap: 9 (ref 5–15)
BUN: 41 mg/dL — ABNORMAL HIGH (ref 8–23)
CO2: 26 mmol/L (ref 22–32)
Calcium: 8.9 mg/dL (ref 8.9–10.3)
Chloride: 101 mmol/L (ref 98–111)
Creatinine, Ser: 1.76 mg/dL — ABNORMAL HIGH (ref 0.44–1.00)
GFR, Estimated: 30 mL/min — ABNORMAL LOW (ref >60)
Glucose, Bld: 102 mg/dL — ABNORMAL HIGH (ref 70–99)
Potassium: 4.5 mmol/L (ref 3.5–5.1)
Sodium: 136 mmol/L (ref 135–145)

## 2021-05-17 LAB — TROPONIN I (HIGH SENSITIVITY)
Troponin I (High Sensitivity): 6 ng/L (ref <18)
Troponin I (High Sensitivity): 5 ng/L (ref <18)

## 2021-05-17 LAB — CK: Total CK: 75 U/L (ref 38–234)

## 2021-05-17 MED ORDER — FERROUS SULFATE 325 (65 FE) MG PO TABS
325.0000 mg | ORAL_TABLET | Freq: Every day | ORAL | Status: DC
  Administered 2021-05-18 – 2021-05-20 (×3): 325 mg via ORAL
  Filled 2021-05-17 (×3): qty 1

## 2021-05-17 MED ORDER — POLYETHYLENE GLYCOL 3350 17 G PO PACK: 17.0000 g | PACK | Freq: Every day | ORAL | Status: DC | PRN

## 2021-05-17 MED ORDER — ONDANSETRON HCL 4 MG/2ML IJ SOLN
4.0000 mg | Freq: Four times a day (QID) | INTRAMUSCULAR | Status: DC | PRN
Start: 2021-05-17 — End: 2021-05-20

## 2021-05-17 MED ORDER — CARVEDILOL 6.25 MG PO TABS
12.5000 mg | ORAL_TABLET | Freq: Two times a day (BID) | ORAL | Status: DC
  Administered 2021-05-18 – 2021-05-20 (×5): 12.5 mg via ORAL
  Filled 2021-05-17 (×5): qty 2

## 2021-05-17 MED ORDER — ASCORBIC ACID 500 MG PO TABS
1000.0000 mg | ORAL_TABLET | Freq: Three times a day (TID) | ORAL | Status: DC
  Administered 2021-05-18 – 2021-05-20 (×7): 1000 mg via ORAL
  Filled 2021-05-17 (×7): qty 2

## 2021-05-17 MED ORDER — LINACLOTIDE 145 MCG PO CAPS
290.0000 ug | ORAL_CAPSULE | Freq: Every day | ORAL | Status: DC
  Administered 2021-05-18 – 2021-05-20 (×3): 290 ug via ORAL
  Filled 2021-05-17 (×3): qty 2
  Filled 2021-05-17: qty 1

## 2021-05-17 MED ORDER — FLUOXETINE HCL 20 MG PO CAPS
40.0000 mg | ORAL_CAPSULE | Freq: Every day | ORAL | Status: DC
  Administered 2021-05-18 – 2021-05-20 (×3): 40 mg via ORAL
  Filled 2021-05-17 (×3): qty 2

## 2021-05-17 MED ORDER — ACETAMINOPHEN 325 MG RE SUPP
650.0000 mg | Freq: Four times a day (QID) | RECTAL | Status: DC | PRN
  Filled 2021-05-17: qty 2

## 2021-05-17 MED ORDER — ACETAMINOPHEN 325 MG PO TABS
650.0000 mg | ORAL_TABLET | Freq: Four times a day (QID) | ORAL | Status: DC | PRN
  Administered 2021-05-19: 650 mg via ORAL
  Filled 2021-05-17 (×2): qty 2

## 2021-05-17 MED ORDER — INSULIN ASPART 100 UNIT/ML IJ SOLN: 0.0000 [IU] | Freq: Three times a day (TID) | INTRAMUSCULAR | Status: DC

## 2021-05-17 MED ORDER — ATROPINE SULFATE 1 % OP SOLN
1.0000 [drp] | Freq: Two times a day (BID) | OPHTHALMIC | Status: DC
  Administered 2021-05-18 – 2021-05-20 (×5): 1 [drp] via OPHTHALMIC
  Filled 2021-05-17: qty 2

## 2021-05-17 MED ORDER — ALLOPURINOL 100 MG PO TABS
100.0000 mg | ORAL_TABLET | Freq: Every day | ORAL | Status: DC
  Administered 2021-05-18 – 2021-05-19 (×2): 100 mg via ORAL
  Filled 2021-05-17 (×2): qty 1

## 2021-05-17 MED ORDER — LACTATED RINGERS IV SOLN: INTRAVENOUS | Status: DC

## 2021-05-17 MED ORDER — HYDRALAZINE HCL 20 MG/ML IJ SOLN
10.0000 mg | Freq: Four times a day (QID) | INTRAMUSCULAR | Status: DC | PRN
Start: 2021-05-17 — End: 2021-05-20

## 2021-05-17 MED ORDER — ONDANSETRON HCL 4 MG PO TABS
4.0000 mg | ORAL_TABLET | Freq: Four times a day (QID) | ORAL | Status: DC | PRN
  Administered 2021-05-19: 4 mg via ORAL
  Filled 2021-05-17: qty 1

## 2021-05-17 MED ORDER — LORAZEPAM 1 MG PO TABS: 1.0000 mg | ORAL_TABLET | Freq: Four times a day (QID) | ORAL | Status: DC | PRN

## 2021-05-17 MED ORDER — ENOXAPARIN SODIUM 30 MG/0.3ML IJ SOSY
30.0000 mg | PREFILLED_SYRINGE | INTRAMUSCULAR | Status: DC
  Administered 2021-05-18: 30 mg via SUBCUTANEOUS
  Filled 2021-05-17: qty 0.3

## 2021-05-17 MED ORDER — VIBEGRON 75 MG PO TABS: 75.0000 mg | ORAL_TABLET | Freq: Every day | ORAL | Status: DC

## 2021-05-17 MED ORDER — ATORVASTATIN CALCIUM 20 MG PO TABS
80.0000 mg | ORAL_TABLET | Freq: Every day | ORAL | Status: DC
  Administered 2021-05-18 – 2021-05-20 (×3): 80 mg via ORAL
  Filled 2021-05-17 (×3): qty 4

## 2021-05-17 MED ORDER — ALBUTEROL SULFATE HFA 108 (90 BASE) MCG/ACT IN AERS: 2.0000 | INHALATION_SPRAY | RESPIRATORY_TRACT | Status: DC | PRN

## 2021-05-17 MED ORDER — LORATADINE 10 MG PO TABS
10.0000 mg | ORAL_TABLET | Freq: Every day | ORAL | Status: DC
  Administered 2021-05-18 – 2021-05-20 (×3): 10 mg via ORAL
  Filled 2021-05-17 (×3): qty 1

## 2021-05-17 MED ORDER — LEVOTHYROXINE SODIUM 50 MCG PO TABS
25.0000 ug | ORAL_TABLET | Freq: Every day | ORAL | Status: DC
  Administered 2021-05-18 – 2021-05-20 (×3): 25 ug via ORAL
  Filled 2021-05-17 (×3): qty 1

## 2021-05-17 MED ORDER — LACTATED RINGERS IV BOLUS
1000.0000 mL | Freq: Once | INTRAVENOUS | Status: AC
  Administered 2021-05-17: 1000 mL via INTRAVENOUS

## 2021-05-17 MED ORDER — PREDNISOLONE ACETATE 1 % OP SUSP
1.0000 [drp] | Freq: Two times a day (BID) | OPHTHALMIC | Status: DC
  Administered 2021-05-18 – 2021-05-20 (×5): 1 [drp] via OPHTHALMIC
  Filled 2021-05-17: qty 1

## 2021-05-17 MED ORDER — MOMETASONE FURO-FORMOTEROL FUM 200-5 MCG/ACT IN AERO
2.0000 | INHALATION_SPRAY | Freq: Two times a day (BID) | RESPIRATORY_TRACT | Status: DC
  Administered 2021-05-18 – 2021-05-20 (×5): 2 via RESPIRATORY_TRACT
  Filled 2021-05-17: qty 8.8

## 2021-05-17 MED ORDER — INSULIN GLARGINE-YFGN 100 UNIT/ML ~~LOC~~ SOLN
8.0000 [IU] | Freq: Every day | SUBCUTANEOUS | Status: DC
  Filled 2021-05-17: qty 0.08

## 2021-05-17 MED ORDER — ASPIRIN EC 81 MG PO TBEC
81.0000 mg | DELAYED_RELEASE_TABLET | Freq: Every day | ORAL | Status: DC
  Administered 2021-05-18 – 2021-05-20 (×3): 81 mg via ORAL
  Filled 2021-05-17 (×3): qty 1

## 2021-05-17 NOTE — Assessment & Plan Note (Signed)
?   Patient presenting with several day history of recurrent confusion lethargy, hallucinations and poor oral intake ?? Etiology is not yet clear although volume depletion is certainly a contributing factor ?? Hydrating patient with intravenous isotonic fluids ?? Obtaining vitamin B12, TSH, inflammatory markers, ammonia, RPR ?? Holding sedating medications as able ?? CT head without contrast in the emergency department unremarkable ?? COVID-19 PCR testing negative ?? ER provider has ordered MRI brain, will follow-up ?

## 2021-05-17 NOTE — Assessment & Plan Note (Signed)
?   Continue home regimen of Coreg ?? Holding home regimen of losartan due to acute kidney injury ?? As needed intravenous and hypertensives for markedly elevated blood pressure. ?

## 2021-05-17 NOTE — Assessment & Plan Note (Signed)
?   Approximately 6 falls in the past 24 hours according to family ?? Likely secondary to unsteady gait due to weakness and volume depletion ?? MRI brain ordered by the emergency department provider will additionally identify any central cause of patient's falls ?? Fall precaution ?? Hydrating patient with intravenous isotonic fluids ?? PT evaluation ordered ?

## 2021-05-17 NOTE — Assessment & Plan Note (Addendum)
?   Patient been placed on Accu-Cheks before every meal and nightly with sliding scale insulin ?? Holding home regimen of basal insulin therapy due to bouts of hypoglycemia ?? Hemoglobin A1c noted to be 6.7% during prior hospitalization  ?? diabetic Diet as tolerated ? ? ?

## 2021-05-17 NOTE — Assessment & Plan Note (Signed)
.   Resume home regimen of Synthroid 

## 2021-05-17 NOTE — ED Provider Notes (Signed)
? ?[facility] ?Provider Note ? ? ? Event Date/Time  ? First MD Initiated Contact with Patient 05/17/21 2117   ?  (approximate) ? ? ?History  ? ?Fall and Altered Mental Status ? ? ?HPI ? ?Madeline Martinez is a 76 y.o. female with a history of hypertension, diabetes, hypothyroidism, CKD who is brought to the ED by daughter due to decreased mobility and energy over the last week, poor oral intake for the past 3 days.  Multiple falls today.  Also notes that patient has become increasingly confused with some hallucinations about out-of-state family members being present and visiting.  No chest pain or shortness of breath, no fever.  No diarrhea.  No vomiting. ?  ? ? ?Physical Exam  ? ?Triage Vital Signs: ?ED Triage Vitals  ?Enc Vitals Group  ?   BP 05/17/21 1940 108/65  ?   Pulse Rate 05/17/21 1940 87  ?   Resp 05/17/21 1940 18  ?   Temp 05/17/21 1940 98.8 ?F (37.1 ?C)  ?   Temp Source 05/17/21 1940 Oral  ?   SpO2 05/17/21 1940 96 %  ?   Weight 05/17/21 1943 190 lb (86.2 kg)  ?   Height 05/17/21 1943 '5\' 1"'$  (1.549 m)  ?   Head Circumference --   ?   Peak Flow --   ?   Pain Score 05/17/21 1943 0  ?   Pain Loc --   ?   Pain Edu? --   ?   Excl. in [place]? --   ? ? ?Most recent vital signs: ?Vitals:  ? 05/17/21 1940  ?BP: 108/65  ?Pulse: 87  ?Resp: 18  ?Temp: 98.8 ?F (37.1 ?C)  ?SpO2: 96%  ? ? ? ?General: Awake, no distress.  Dry mucous membranes. ?CV:  Good peripheral perfusion.  Regular rate and rhythm ?Resp:  Normal effort.  Clear to auscultation bilaterally ?Abd:  No distention.  Soft nontender ? ? ? ?ED Results / Procedures / Treatments  ? ?Labs ?(all labs ordered are listed, but only abnormal results are displayed) ?Labs Reviewed  ?CBC - Abnormal; Notable for the following components:  ?    Result Value  ? RBC 3.80 (*)   ? Hemoglobin 11.3 (*)   ? All other components within normal limits  ?BASIC METABOLIC PANEL - Abnormal; Notable for the following components:  ? Glucose, Bld 102 (*)   ? BUN 41 (*)    ? Creatinine, Ser 1.76 (*)   ? GFR, Estimated 30 (*)   ? All other components within normal limits  ?RESP PANEL BY RT-PCR (FLU A&B, COVID) ARPGX2  ?CK  ?APTT  ?PROTIME-INR  ?URINALYSIS, ROUTINE W REFLEX MICROSCOPIC  ?TROPONIN I (HIGH SENSITIVITY)  ?TROPONIN I (HIGH SENSITIVITY)  ? ? ? ?EKG ? ?Interpreted by me ?Normal sinus rhythm rate of 81.  Normal axis and intervals.  Normal QRS ST segments and T waves. ? ? ?RADIOLOGY ?CT head and cervical spine viewed and interpreted by me, negative for intracranial hemorrhage or other acute injury.  Radiology report reviewed. ? ?Chest x-ray unremarkable. ? ?MRI brain unremarkable. ? ? ? ?PROCEDURES: ? ?Critical Care performed: No ? ?Procedures ? ? ?MEDICATIONS ORDERED IN ED: ?Medications  ?lactated ringers bolus 1,000 mL (has no administration in time range)  ? ? ? ?IMPRESSION / MDM / ASSESSMENT AND PLAN / ED COURSE  ?I reviewed the triage vital signs and the nursing notes. ?             ?               ? ?  Differential diagnosis includes, but is not limited to, dehydration, electrolyte abnormality, intracranial hemorrhage, C-spine fracture, stroke, pneumonia, UTI, viral illness ? ? ? ?Patient presents with functional decline, multiple falls.  Appears dehydrated, and labs show AKI.  Rest of the work-up was unremarkable including normal troponin, negative COVID and flu test, CK is normal, no acute anemia.  No acute traumatic findings on imaging.  Patient given IV fluids for hydration.  Discussed with hospitalist for further evaluation. ?  ? ? ?FINAL CLINICAL IMPRESSION(S) / ED DIAGNOSES  ? ?Final diagnoses:  ?Altered mental status, unspecified altered mental status type  ?AKI (acute kidney injury) ([place])  ? ? ? ?Rx / DC Orders  ? ?ED Discharge Orders   ? ? None  ? ?  ? ? ? ?Note:  This document was prepared using Dragon voice recognition software and may include unintentional dictation errors. ?  ?Carrie Mew, MD ?05/18/21 0018 ? ?

## 2021-05-17 NOTE — ED Triage Notes (Signed)
Pt presents via POV with complaints of multiple ground level falls and AMS. Pt is a poor historian and is unable to articulate how she fell or if she had LOC. Pts daughter states that she's had a decrease in mobility for the last week and has in home health that comes by daily. Denies CP or SOB.  ?

## 2021-05-17 NOTE — H&P (Addendum)
?History and Physical  ? ? ?Patient: Madeline Martinez MRN: [id number] [place]: 05/17/2021 ? ?Date of Service: the patient was seen and examined on 05/18/2021 ? ?Patient coming from: Home ? ?Chief Complaint:  ?Chief Complaint  ?Patient presents with  ? Fall  ? Altered Mental Status  ? ? ?HPI:  ? ?76 year old female with past medical history of insulin-dependent diabetes mellitus type 2, diabetes mellitus stage IIIa, hyperlipidemia, hypertension, gout, moderate persistent asthma, gastroesophageal reflux disease who presents to [facility] emergency department after developing a several day history of confusion, suffering multiple ground-level falls.  ? ?Patient is a poor historian and is unable to provide significant history due to confusion. ? ?Of note, patient was recently hospitalized at [facility] from 3/7 until 3/9 for acute metabolic encephalopathy thought to be secondary to Klebsiella urinary tract infection.  Patient was treated initially with intravenous antibiotics and intravenous fluids but was later switched to oral Keflex prior to discharge. ? ?Patient now presents back to the emergency department with family due to a several day history of progressively worsening lethargy and confusion.  According to family, patient is seemingly speaking to family members that are not present which was quite disconcerting to them.  Patient has additionally been exhibiting increasingly worsening oral intake over the span of time.  Family denies any evidence of vomiting, diarrhea, complaints of pain or fever as of late. ? ?In the past 24 hours the patient has begun to fall and the family counts approximately 6 separate times that the patient has experienced a fall.  There is no associated loss of consciousness.  Due to progressively worsening lethargy and confusion with frequent falls patient was eventually brought into [facility] emergency department for evaluation. ? ?Upon evaluation in the emergency department noncontrast CT imaging of the head  was unremarkable.  Chest x-ray revealed no evidence of pneumonia and COVID-19 PCR testing was negative.  Clinically, the patient was felt to be volume depleted and therefore was initiated on intravenous fluids.  Patient was additionally found to be suffering from acute kidney injury with creatinine of 1.76, up from 0.97.  Due to frequent falls and acute kidney injury the hospitalist group has been called to assess the patient for admission to the hospital. ? ?Review of Systems: Review of Systems  ?Unable to perform ROS: Mental status change  ? ? ?Past Medical History:  ?Diagnosis Date  ? Acid reflux   ? Anxiety   ? Depression   ? Diabetes mellitus   ? Dysrhythmia   ? Gout   ? Heart murmur   ? HTN (hypertension)   ? Hyperlipidemia   ? Sleep apnea   ? ? ?Past Surgical History:  ?Procedure Laterality Date  ? ABDOMINAL HYSTERECTOMY    ? BACK SURGERY  2007  ? DISCS  ? BIOPSY  10/08/2018  ? Procedure: BIOPSY;  Surgeon: Irving Copas., MD;  Location: [facility] ENDOSCOPY;  Service: Gastroenterology;;  ? COLONOSCOPY WITH PROPOFOL N/A 12/19/2017  ? Procedure: COLONOSCOPY WITH PROPOFOL;  Surgeon: Jonathon Bellows, MD;  Location: [facility] ENDOSCOPY;  Service: Gastroenterology;  Laterality: N/A;  ? 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 30  ? BILATERAL  ? ENDOSCOPIC MUCOSAL RESECTION N/A 10/08/2018  ? Procedure: ENDOSCOPIC MUCOSAL RESECTION;  Surgeon: Rush Landmark Telford Nab., MD;  Location: [facility] ENDOSCOPY;  Service: Gastroenterology;  Laterality: N/A;  ? ESOPHAGOGASTRODUODENOSCOPY (EGD) WITH PROPOFOL N/A 08/28/2018  ? Procedure: ESOPHAGOGASTRODUODENOSCOPY (EGD) WITH PROPOFOL;  Surgeon: Jonathon Bellows, MD;  Location: [facility] ENDOSCOPY;  Service: Gastroenterology;  Laterality: N/A;  ? ESOPHAGOGASTRODUODENOSCOPY (EGD)  WITH PROPOFOL N/A 10/08/2018  ? Procedure: ESOPHAGOGASTRODUODENOSCOPY (EGD) WITH PROPOFOL;  Surgeon: Rush Landmark Telford Nab., MD;  Location: [facility] ENDOSCOPY;  Service: Gastroenterology;  Laterality: N/A;  ? EUS N/A 10/08/2018  ? Procedure: UPPER  ENDOSCOPIC ULTRASOUND (EUS) RADIAL;  Surgeon: Rush Landmark Telford Nab., MD;  Location: [facility] ENDOSCOPY;  Service: Gastroenterology;  Laterality: N/A;  ? PARTIAL HYSTERECTOMY  1986  ? POLYPECTOMY  10/08/2018  ? Procedure: POLYPECTOMY;  Surgeon: Mansouraty, Telford Nab., MD;  Location: [facility] ENDOSCOPY;  Service: Gastroenterology;;  ? ? ?Social History:  reports that she has never smoked. She has never used smokeless tobacco. She reports that she does not drink alcohol and does not use drugs. ? ?No Known Allergies ? ?Family History  ?Problem Relation Age of Onset  ? Breast cancer Mother   ? Heart failure Father   ? Other Sister   ?     DEGEN.DISC DISEASE  ? Breast cancer Maternal Grandmother   ? Kidney cancer Neg Hx   ? Kidney disease Neg Hx   ? Prostate cancer Neg Hx   ? ? ?Prior to Admission medications   ?Medication Sig Start Date End Date Taking? Authorizing Provider  ?ADVAIR HFA 115-21 MCG/ACT inhaler Inhale 2 puffs into the lungs 2 (two) times daily. 04/25/21   [provider]  ?albuterol (VENTOLIN HFA) 108 (90 Base) MCG/ACT inhaler Inhale 2 puffs into the lungs every 4 (four) hours as needed for wheezing or shortness of breath. 11/16/20   [provider]  ?allopurinol (ZYLOPRIM) 100 MG tablet Take 100 mg by mouth daily with lunch.  04/30/16   [provider]  ?ascorbic acid (VITAMIN C) 1000 MG tablet Take 1,000 mg by mouth 3 (three) times daily.    [provider]  ?aspirin 81 MG EC tablet Take 81 mg by mouth daily.    [provider]  ?atorvastatin (LIPITOR) 80 MG tablet Take 80 mg by mouth daily.    [provider]  ?atropine 1 % ophthalmic solution Place 1 drop into the right eye 2 (two) times daily. 04/06/21   [provider]  ?Biotin 5000 MCG TABS Take 5,000 mcg by mouth daily. HAIR/SKIN/NAILS    [provider]  ?carvedilol (COREG) 12.5 MG tablet Take 12.5 mg by mouth 2 (two) times daily.    [provider]  ?cetirizine (ZYRTEC) 10 MG tablet  Take 10 mg by mouth daily. 04/20/17   [provider]  ?Cholecalciferol (VITAMIN D-3) 125 MCG (5000 UT) TABS Take 5,000 Units by mouth daily.    [provider]  ?DUPIXENT 300 MG/2ML SOPN Inject 300 mg into the skin every 14 (fourteen) days. 05/01/21   [provider]  ?ferrous sulfate 325 (65 FE) MG tablet Take 325 mg by mouth daily. 12/03/19   [provider]  ?FLUoxetine (PROZAC) 40 MG capsule Take 40 mg by mouth daily.    [provider]  ?glipiZIDE (GLUCOTROL XL) 5 MG 24 hr tablet Take 1 tablet (5 mg total) by mouth daily. 05/04/21   Fritzi Mandes, MD  ?HYDROcodone-acetaminophen (NORCO/VICODIN) 5-325 MG tablet Take 1 tablet by mouth daily as needed (pain.).  08/11/18   [provider]  ?levothyroxine (SYNTHROID) 25 MCG tablet Take 25 mcg by mouth daily. 02/01/20   [provider]  ?Rolan Lipa 290 MCG CAPS capsule TAKE 1 CAPSULE BY MOUTH DAILY BEFORE BREAKFAST. 10/17/20   Jonathon Bellows, MD  ?LORazepam (ATIVAN) 1 MG tablet Take 1 mg by mouth 4 (four) times daily.  08/17/18   [provider]  ?losartan (COZAAR) 100 MG tablet Take 100 mg by mouth daily. 02/09/20   [provider]  ?metFORMIN (GLUCOPHAGE) 1000 MG tablet Take 1,000 mg by mouth 2 (two) times daily.    [provider]  ?prednisoLONE acetate (PRED FORTE) 1 % ophthalmic suspension Place 1 drop into the right eye 2 (two) times daily. 04/24/21   [provider]  ?pregabalin (LYRICA) 75 MG capsule Take 75 mg by mouth 3 (three) times daily. 02/05/20   [provider]  ?Probiotic Product (PROBIOTIC PO) Take 1 tablet by mouth daily.    [provider]  ?TRESIBA FLEXTOUCH 100 UNIT/ML FlexTouch Pen Inject 15 Units into the skin daily. 05/04/21   Fritzi Mandes, MD  ?Vibegron (GEMTESA) 75 MG TABS Take 75 mg by mouth daily. 03/15/21   Zara Council A, PA-C  ?vitamin E 45 MG (100 UNITS) capsule Take by mouth.    [provider]  ? ? ?Physical Exam: ? ?Vitals:  ?  05/17/21 1943 05/17/21 2200 05/17/21 2343 05/18/21 0409  ?BP:  126/68 122/68 134/75  ?Pulse:  77 77 84  ?Resp:  '17 14 18  '$ ?Temp:  98.8 ?F (37.1 ?C) 97.9 ?F (36.6 ?C) 98.4 ?F (36.9 ?C)  ?TempSrc:  Oral

## 2021-05-17 NOTE — ED Notes (Signed)
Pt discussed with EDP Jari Pigg) see orders for intervention.  ?

## 2021-05-17 NOTE — Assessment & Plan Note (Signed)
.   Continuing home regimen of lipid lowering therapy.  

## 2021-05-17 NOTE — Assessment & Plan Note (Signed)
?   Felt to be prerenal secondary to volume depletion ?? Hydrating patient with intravenous isotonic fluids ?? Urinalysis ordered and is pending ?? Monitoring renal function and electrolytes with serial chemistries ?? Avoiding nephrotoxic agents ?

## 2021-05-18 DIAGNOSIS — I10 Essential (primary) hypertension: Secondary | ICD-10-CM | POA: Diagnosis not present

## 2021-05-18 DIAGNOSIS — Z7984 Long term (current) use of oral hypoglycemic drugs: Secondary | ICD-10-CM | POA: Diagnosis not present

## 2021-05-18 DIAGNOSIS — N179 Acute kidney failure, unspecified: Secondary | ICD-10-CM | POA: Diagnosis present

## 2021-05-18 DIAGNOSIS — Z794 Long term (current) use of insulin: Secondary | ICD-10-CM | POA: Diagnosis not present

## 2021-05-18 DIAGNOSIS — M109 Gout, unspecified: Secondary | ICD-10-CM | POA: Diagnosis present

## 2021-05-18 DIAGNOSIS — E162 Hypoglycemia, unspecified: Secondary | ICD-10-CM | POA: Diagnosis present

## 2021-05-18 DIAGNOSIS — R4182 Altered mental status, unspecified: Secondary | ICD-10-CM | POA: Diagnosis present

## 2021-05-18 DIAGNOSIS — G4730 Sleep apnea, unspecified: Secondary | ICD-10-CM | POA: Diagnosis present

## 2021-05-18 DIAGNOSIS — I129 Hypertensive chronic kidney disease with stage 1 through stage 4 chronic kidney disease, or unspecified chronic kidney disease: Secondary | ICD-10-CM | POA: Diagnosis present

## 2021-05-18 DIAGNOSIS — E1122 Type 2 diabetes mellitus with diabetic chronic kidney disease: Secondary | ICD-10-CM | POA: Diagnosis present

## 2021-05-18 DIAGNOSIS — K219 Gastro-esophageal reflux disease without esophagitis: Secondary | ICD-10-CM | POA: Diagnosis present

## 2021-05-18 DIAGNOSIS — Z803 Family history of malignant neoplasm of breast: Secondary | ICD-10-CM | POA: Diagnosis not present

## 2021-05-18 DIAGNOSIS — J4540 Moderate persistent asthma, uncomplicated: Secondary | ICD-10-CM | POA: Diagnosis present

## 2021-05-18 DIAGNOSIS — E039 Hypothyroidism, unspecified: Secondary | ICD-10-CM | POA: Diagnosis present

## 2021-05-18 DIAGNOSIS — Y92009 Unspecified place in unspecified non-institutional (private) residence as the place of occurrence of the external cause: Secondary | ICD-10-CM | POA: Diagnosis not present

## 2021-05-18 DIAGNOSIS — Z8249 Family history of ischemic heart disease and other diseases of the circulatory system: Secondary | ICD-10-CM | POA: Diagnosis not present

## 2021-05-18 DIAGNOSIS — R2681 Unsteadiness on feet: Secondary | ICD-10-CM | POA: Diagnosis present

## 2021-05-18 DIAGNOSIS — Z20822 Contact with and (suspected) exposure to covid-19: Secondary | ICD-10-CM | POA: Diagnosis present

## 2021-05-18 DIAGNOSIS — E11649 Type 2 diabetes mellitus with hypoglycemia without coma: Secondary | ICD-10-CM | POA: Diagnosis present

## 2021-05-18 DIAGNOSIS — Z79890 Hormone replacement therapy (postmenopausal): Secondary | ICD-10-CM | POA: Diagnosis not present

## 2021-05-18 DIAGNOSIS — E782 Mixed hyperlipidemia: Secondary | ICD-10-CM | POA: Diagnosis present

## 2021-05-18 DIAGNOSIS — R296 Repeated falls: Secondary | ICD-10-CM | POA: Diagnosis present

## 2021-05-18 DIAGNOSIS — Z7982 Long term (current) use of aspirin: Secondary | ICD-10-CM | POA: Diagnosis not present

## 2021-05-18 DIAGNOSIS — E869 Volume depletion, unspecified: Secondary | ICD-10-CM | POA: Diagnosis present

## 2021-05-18 DIAGNOSIS — W19XXXA Unspecified fall, initial encounter: Secondary | ICD-10-CM | POA: Diagnosis not present

## 2021-05-18 DIAGNOSIS — E1169 Type 2 diabetes mellitus with other specified complication: Secondary | ICD-10-CM | POA: Diagnosis present

## 2021-05-18 DIAGNOSIS — G9341 Metabolic encephalopathy: Secondary | ICD-10-CM | POA: Diagnosis present

## 2021-05-18 DIAGNOSIS — N1831 Chronic kidney disease, stage 3a: Secondary | ICD-10-CM | POA: Diagnosis present

## 2021-05-18 LAB — CBC WITH DIFFERENTIAL/PLATELET
Abs Immature Granulocytes: 0.02 10*3/uL (ref 0.00–0.07)
Basophils Absolute: 0.1 10*3/uL (ref 0.0–0.1)
Basophils Relative: 1 %
Eosinophils Absolute: 0.5 10*3/uL (ref 0.0–0.5)
Eosinophils Relative: 7 %
HCT: 35.1 % — ABNORMAL LOW (ref 36.0–46.0)
Hemoglobin: 10.8 g/dL — ABNORMAL LOW (ref 12.0–15.0)
Immature Granulocytes: 0 %
Lymphocytes Relative: 25 %
Lymphs Abs: 1.8 10*3/uL (ref 0.7–4.0)
MCH: 29.7 pg (ref 26.0–34.0)
MCHC: 30.8 g/dL (ref 30.0–36.0)
MCV: 96.4 fL (ref 80.0–100.0)
Monocytes Absolute: 0.6 10*3/uL (ref 0.1–1.0)
Monocytes Relative: 9 %
Neutro Abs: 4.4 10*3/uL (ref 1.7–7.7)
Neutrophils Relative %: 58 %
Platelets: 336 10*3/uL (ref 150–400)
RBC: 3.64 MIL/uL — ABNORMAL LOW (ref 3.87–5.11)
RDW: 15.1 % (ref 11.5–15.5)
WBC: 7.4 10*3/uL (ref 4.0–10.5)
nRBC: 0 % (ref 0.0–0.2)

## 2021-05-18 LAB — COMPREHENSIVE METABOLIC PANEL
ALT: 22 U/L (ref 0–44)
AST: 21 U/L (ref 15–41)
Albumin: 3.6 g/dL (ref 3.5–5.0)
Alkaline Phosphatase: 51 U/L (ref 38–126)
Anion gap: 7 (ref 5–15)
BUN: 36 mg/dL — ABNORMAL HIGH (ref 8–23)
CO2: 28 mmol/L (ref 22–32)
Calcium: 9.1 mg/dL (ref 8.9–10.3)
Chloride: 104 mmol/L (ref 98–111)
Creatinine, Ser: 1.41 mg/dL — ABNORMAL HIGH (ref 0.44–1.00)
GFR, Estimated: 39 mL/min — ABNORMAL LOW (ref >60)
Glucose, Bld: 95 mg/dL (ref 70–99)
Potassium: 4.5 mmol/L (ref 3.5–5.1)
Sodium: 139 mmol/L (ref 135–145)
Total Bilirubin: 0.5 mg/dL (ref 0.3–1.2)
Total Protein: 6.9 g/dL (ref 6.5–8.1)

## 2021-05-18 LAB — URINALYSIS, ROUTINE W REFLEX MICROSCOPIC
Bilirubin Urine: NEGATIVE
Glucose, UA: NEGATIVE mg/dL
Hgb urine dipstick: NEGATIVE
Ketones, ur: NEGATIVE mg/dL
Nitrite: NEGATIVE
Protein, ur: NEGATIVE mg/dL
Specific Gravity, Urine: 1.011 (ref 1.005–1.030)
pH: 5 (ref 5.0–8.0)

## 2021-05-18 LAB — RPR: RPR Ser Ql: NONREACTIVE

## 2021-05-18 LAB — GLUCOSE, CAPILLARY
Glucose-Capillary: 105 mg/dL — ABNORMAL HIGH (ref 70–99)
Glucose-Capillary: 108 mg/dL — ABNORMAL HIGH (ref 70–99)
Glucose-Capillary: 181 mg/dL — ABNORMAL HIGH (ref 70–99)
Glucose-Capillary: 240 mg/dL — ABNORMAL HIGH (ref 70–99)
Glucose-Capillary: 241 mg/dL — ABNORMAL HIGH (ref 70–99)
Glucose-Capillary: 281 mg/dL — ABNORMAL HIGH (ref 70–99)
Glucose-Capillary: 65 mg/dL — ABNORMAL LOW (ref 70–99)
Glucose-Capillary: 72 mg/dL (ref 70–99)
Glucose-Capillary: 73 mg/dL (ref 70–99)
Glucose-Capillary: 81 mg/dL (ref 70–99)
Glucose-Capillary: 84 mg/dL (ref 70–99)

## 2021-05-18 LAB — VITAMIN B12: Vitamin B-12: 268 pg/mL (ref 180–914)

## 2021-05-18 LAB — FOLATE: Folate: 29 ng/mL (ref >5.9)

## 2021-05-18 LAB — C-REACTIVE PROTEIN: CRP: 0.6 mg/dL (ref <1.0)

## 2021-05-18 LAB — AMMONIA: Ammonia: 16 umol/L (ref 9–35)

## 2021-05-18 LAB — HIV ANTIBODY (ROUTINE TESTING W REFLEX): HIV Screen 4th Generation wRfx: NONREACTIVE

## 2021-05-18 LAB — SEDIMENTATION RATE: Sed Rate: 52 mm/hr — ABNORMAL HIGH (ref 0–30)

## 2021-05-18 LAB — TSH: TSH: 2.13 u[IU]/mL (ref 0.350–4.500)

## 2021-05-18 LAB — MAGNESIUM: Magnesium: 2.3 mg/dL (ref 1.7–2.4)

## 2021-05-18 MED ORDER — ALBUTEROL SULFATE (2.5 MG/3ML) 0.083% IN NEBU: 2.5000 mg | INHALATION_SOLUTION | RESPIRATORY_TRACT | Status: DC | PRN

## 2021-05-18 MED ORDER — MELATONIN 5 MG PO TABS
2.5000 mg | ORAL_TABLET | Freq: Every day | ORAL | Status: DC
  Administered 2021-05-18 – 2021-05-19 (×2): 2.5 mg via ORAL
  Filled 2021-05-18 (×2): qty 1

## 2021-05-18 MED ORDER — ENSURE ENLIVE PO LIQD
237.0000 mL | Freq: Two times a day (BID) | ORAL | Status: DC
  Administered 2021-05-18 (×2): 237 mL via ORAL

## 2021-05-18 MED ORDER — INSULIN ASPART 100 UNIT/ML IJ SOLN
0.0000 [IU] | INTRAMUSCULAR | Status: DC
  Administered 2021-05-18: 2 [IU] via SUBCUTANEOUS
  Administered 2021-05-18: 5 [IU] via SUBCUTANEOUS
  Administered 2021-05-18: 3 [IU] via SUBCUTANEOUS
  Administered 2021-05-19 (×3): 2 [IU] via SUBCUTANEOUS
  Administered 2021-05-19: 3 [IU] via SUBCUTANEOUS
  Administered 2021-05-19 – 2021-05-20 (×3): 2 [IU] via SUBCUTANEOUS
  Filled 2021-05-18 (×10): qty 1

## 2021-05-18 MED ORDER — GUAIFENESIN ER 600 MG PO TB12
600.0000 mg | ORAL_TABLET | Freq: Two times a day (BID) | ORAL | Status: DC
  Administered 2021-05-18 – 2021-05-20 (×4): 600 mg via ORAL
  Filled 2021-05-18 (×4): qty 1

## 2021-05-18 MED ORDER — TRAZODONE HCL 50 MG PO TABS: 50.0000 mg | ORAL_TABLET | Freq: Every evening | ORAL | Status: DC | PRN

## 2021-05-18 MED ORDER — ADULT MULTIVITAMIN W/MINERALS CH
1.0000 | ORAL_TABLET | Freq: Every day | ORAL | Status: DC
  Administered 2021-05-18 – 2021-05-20 (×3): 1 via ORAL
  Filled 2021-05-18 (×3): qty 1

## 2021-05-18 MED ORDER — ENOXAPARIN SODIUM 40 MG/0.4ML IJ SOSY
40.0000 mg | PREFILLED_SYRINGE | INTRAMUSCULAR | Status: DC
Start: 2021-05-19 — End: 2021-05-20
  Administered 2021-05-19 – 2021-05-20 (×2): 40 mg via SUBCUTANEOUS
  Filled 2021-05-18 (×2): qty 0.4

## 2021-05-18 MED ORDER — MIRABEGRON ER 25 MG PO TB24
25.0000 mg | ORAL_TABLET | Freq: Every day | ORAL | Status: DC
Start: 2021-05-18 — End: 2021-05-20
  Administered 2021-05-18 – 2021-05-19 (×2): 25 mg via ORAL
  Filled 2021-05-18 (×4): qty 1

## 2021-05-18 MED ORDER — DEXTROSE IN LACTATED RINGERS 5 % IV SOLN
INTRAVENOUS | Status: AC
Start: 2021-05-18 — End: 2021-05-18

## 2021-05-18 NOTE — Evaluation (Signed)
Physical Therapy Evaluation ?Patient Details ?Name: Madeline Martinez ?MRN: [id number] ?DOB: 1946-02-13 ?Today's Date: 05/18/2021 ? ?History of Present Illness ? 76 year old female with past medical history of insulin-dependent diabetes mellitus type 2, diabetes mellitus stage IIIa, hyperlipidemia, hypertension, gout, moderate persistent asthma, gastroesophageal reflux disease who presents to [facility] emergency department after developing a several day history of confusion, suffering multiple falls.  ?Clinical Impression ? Pt did well with PT and despite some apparent confusion was engaged and eager to work with PT.  She was able to get to EOB and to standing w/o direct assist, though did need encouargement and cuing to insure safe transitions and safety awareness.  During ambulation she needed regular vcs and still hit walker into the wall multiple times, though she had no LOBs or overt unsteadiness.  Pt with slow, but steady gait.  Pt should be safe to return home with HHPT once cleared medically for d/c.   ?   ? ?Recommendations for follow up therapy are one component of a multi-disciplinary discharge planning process, led by the attending physician.  Recommendations may be updated based on patient status, additional functional criteria and insurance authorization. ? ?Follow Up Recommendations Home health PT ? ?  ?Assistance Recommended at Discharge Intermittent Supervision/Assistance  ?Patient can return home with the following ? Assistance with cooking/housework;Assist for transportation;Help with stairs or ramp for entrance;Direct supervision/assist for medications management;Direct supervision/assist for financial management ? ?  ?Equipment Recommendations None recommended by PT  ?Recommendations for Other Services ?    ?  ?Functional Status Assessment Patient has had a recent decline in their functional status and demonstrates the ability to make significant improvements in function in a reasonable and predictable  amount of time.  ? ?  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 ?  ? ?Mobility ? Bed Mobility ?Overal bed mobility: Modified Independent ?Bed Mobility: Supine to Sit ?  ?  ?Supine to sit: Modified independent (Device/Increase time), HOB elevated ?  ?  ?General bed mobility comments: extra time and effort but no physical assistance ?  ? ?Transfers ?Overall transfer level: Needs assistance ?Equipment used: Rolling walker (2 wheels) ?Transfers: Sit to/from Stand ?Sit to Stand: Supervision ?  ?  ?  ?  ?  ?General transfer comment: Cues for set up and sequencing, ?  ? ?Ambulation/Gait ?Ambulation/Gait assistance: Supervision ?Gait Distance (Feet): 200 Feet ?Assistive device: Rolling walker (2 wheels) ?  ?  ?  ?  ?General Gait Details: Pt needing consistent general and directional cuing but fully circumambulated the nurses' station with slow but relatively safe effort. ? ?Stairs ?  ?  ?  ?  ?  ? ?Wheelchair Mobility ?  ? ?Modified Rankin (Stroke Patients Only) ?  ? ?  ? ?Balance Overall balance assessment: Needs assistance ?Sitting-balance support: Bilateral upper extremity supported, Feet supported ?Sitting balance-Leahy Scale: Fair ?  ?  ?Standing balance support: Bilateral upper extremity supported ?Standing balance-Leahy Scale: Fair ?  ?  ?  ?  ?  ?  ?  ?  ?  ?  ?  ?  ?   ? ? ? ?Pertinent Vitals/Pain    ? ? ?Home Living Family/patient expects to be discharged to:: Private residence ?Living Arrangements: Children ?Available Help at Discharge: Family;Available 24 hours/day ?Type of Home: House ?Home Access: Level entry;Ramped entrance ?  ?  ?Alternate Level Stairs-Number of Steps: flight ?Home Layout: Two level;Able to live on main level  with bedroom/bathroom ?Home Equipment: Conservation officer, nature (2 wheels);Cane - single point;Shower seat;BSC/3in1 ?   ?  ?Prior Function Prior Level of Function : Independent/Modified  Independent ?  ?  ?  ?  ?Mobility (physical): Bed mobility;Transfers;Gait ?  ?Mobility Comments: Pt reports mod I with use of RW or SPC for mobility, states in the home she often just funiture cruises ?ADLs Comments: Pt reports mod I for self care but on ocassional asks for assist from daughter if needed. Daughter performs IADLs at home. ?  ? ? ?Hand Dominance  ?   ? ?  ?Extremity/Trunk Assessment  ? Upper Extremity Assessment ?Upper Extremity Assessment: Overall WFL for tasks assessed;Generalized weakness ?  ? ?Lower Extremity Assessment ?Lower Extremity Assessment: Overall WFL for tasks assessed ?  ? ?   ?Communication  ? Communication: No difficulties  ?Cognition Arousal/Alertness: Awake/alert ?Behavior During Therapy: [facility] for tasks assessed/performed ?  ?  ?  ?  ?  ?  ?  ?  ?  ?  ?  ?  ?  ?  ?  ?  ?  ?General Comments: A & Ox4. Regular tangential speech but redirectable ?  ?  ? ?  ?General Comments General comments (skin integrity, edema, etc.): Pt did relatively well with prolonged bout of ambulation, did appear to need RW (aka a cane would not have sufficed) ? ?  ?Exercises    ? ?Assessment/Plan  ?  ?PT Assessment Patient needs continued PT services  ?PT Problem List Decreased strength;Decreased mobility;Obesity;Decreased activity tolerance ? ?   ?  ?PT Treatment Interventions DME instruction;Therapeutic exercise;Gait training;Balance training;Stair training;Neuromuscular re-education;Functional mobility training;Therapeutic activities;Patient/family education   ? ?PT Goals (Current goals can be found in the Care Plan section)  ?Acute Rehab PT Goals ?Patient Stated Goal: to go home and improve her mobility ?PT Goal Formulation: With patient ?Time For Goal Achievement: 06/01/21 ?Potential to Achieve Goals: Good ? ?  ?Frequency Min 2X/week ?  ? ? ?Co-evaluation   ?  ?  ?  ?  ? ? ?  ?AM-PAC PT "6 Clicks" Mobility  ?Outcome Measure Help needed turning from your back to your side while in a flat bed without using  bedrails?: A Little ?Help needed moving from lying on your back to sitting on the side of a flat bed without using bedrails?: A Little ?Help needed moving to and from a bed to a chair (including a wheelchair)?: A Little ?Help needed standing up from a chair using your arms (e.g., wheelchair or bedside chair)?: A Little ?Help needed to walk in hospital room?: A Little ?Help needed climbing 3-5 steps with a railing? : A Little ?6 Click Score: 18 ? ?  ?End of Session Equipment Utilized During Treatment: Gait belt ?Activity Tolerance: Patient tolerated treatment well ?Patient left: in chair;with call bell/phone within reach;with chair alarm set ?  ?PT Visit Diagnosis: Other abnormalities of gait and mobility (R26.89);Muscle weakness (generalized) (M62.81) ?  ? ?Time: 5427-0623 ?PT Time Calculation (min) (ACUTE ONLY): 34 min ? ? ?Charges:   PT Evaluation ?$PT Eval Low Complexity: 1 Low ?PT Treatments ?$Gait Training: 8-22 mins ?  ?   ? ? ?Kreg Shropshire, DPT ?05/18/2021, 3:11 PM ? ?

## 2021-05-18 NOTE — TOC Initial Note (Signed)
Transition of Care (TOC) - Initial/Assessment Note  ? ? ?Patient Details  ?Name: Madeline Martinez ?MRN: [id number] ?Date of Birth: 08-27-1945 ? ?Transition of Care (TOC) CM/SW Contact:    ?Pete Pelt, RN ?Phone Number: ?05/18/2021, 3:57 PM ? ?Clinical Narrative:   Patient lives at home with family.  Family cares for her and assists with transportation and medications.  Discussed medications with family, and daughter states that patient has been taking the same medications for a number of years.  RNCM emphasized discussing medications with all MD and with patient's pharmacist.  Daughter verbalizes understanding. ? ?Daughter states patient has all necessary DME at home, and [facility] has been seeing patient.  Sarah at [facility] notified patient is here, and RNCM requested orders from MD to resume services.                ? ?At this time, daughter verbalizes no further TOC needs.  TOC contact information provided.  TOC to follow. ?  ?  ? ? ?Patient Goals and CMS Choice ?  ?  ?  ? ?Expected Discharge Plan and Services ?  ?  ?  ?  ?  ?                ?  ?  ?  ?  ?  ?  ?  ?  ?  ?  ? ?Prior Living Arrangements/Services ?  ?  ?  ?       ?  ?  ?  ?  ? ?Activities of Daily Living ?Home Assistive Devices/Equipment: CBG Meter, Walker (specify type), Raised toilet seat with rails, Other (Comment), Dentures (specify type), Eyeglasses, Cane (specify quad or straight), Wheelchair ?ADL Screening (condition at time of admission) ?Patient's cognitive ability adequate to safely complete daily activities?: Yes ?Is the patient deaf or have difficulty hearing?: No ?Does the patient have difficulty seeing, even when wearing glasses/contacts?: No ?Does the patient have difficulty concentrating, remembering, or making decisions?: No ?Patient able to express need for assistance with ADLs?: Yes ?Does the patient have difficulty dressing or bathing?: Yes ?Independently performs ADLs?: No ?Communication: Independent with device (comment), Needs  assistance ?Is this a change from baseline?: Change from baseline, expected to last >3 days ?Dressing (OT): Needs assistance ?Is this a change from baseline?: Change from baseline, expected to last >3 days ?Grooming: Needs assistance ?Is this a change from baseline?: Change from baseline, expected to last >3 days ?Feeding: Independent ?Bathing: Needs assistance ?Is this a change from baseline?: Change from baseline, expected to last >3 days ?Toileting: Needs assistance ?Is this a change from baseline?: Change from baseline, expected to last >3days ?In/Out Bed: Needs assistance ?Is this a change from baseline?: Change from baseline, expected to last >3 days ?Walks in Home: Needs assistance, Independent with device (comment) ?Is this a change from baseline?: Change from baseline, expected to last >3 days ?Does the patient have difficulty walking or climbing stairs?: Yes ?Weakness of Legs: Both ?Weakness of Arms/Hands: None ? ?Permission Sought/Granted ?  ?  ?   ?   ?   ?   ? ?Emotional Assessment ?  ?  ?  ?  ?  ?  ? ?Admission diagnosis:  Fall [W19.XXXA] ?AKI (acute kidney injury) ([place]) [N17.9] ?Altered mental status, unspecified altered mental status type [R41.82] ?Acute metabolic encephalopathy [S28.31] ?Patient Active Problem List  ? Diagnosis Date Noted  ? Hypoglycemia 05/18/2021  ? Fall at home, initial encounter 05/17/2021  ? Type 2 diabetes mellitus  with stage 3a chronic kidney disease, with long-term current use of insulin ([place]) 05/17/2021  ? Mixed diabetic hyperlipidemia associated with type 2 diabetes mellitus ([place]) 05/17/2021  ? Acute renal failure superimposed on stage 3a chronic kidney disease ([place]) 05/17/2021  ? Acute metabolic encephalopathy 50/10/3816  ? Anxiety   ? Hypothyroidism   ? Stage 3a chronic kidney disease ([place]) 06/26/2019  ? 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 Essential hypertension   ? Hyperlipidemia   ? Depression   ? Esophageal reflux 01/24/2010  ? ?PCP:  [name],  MD ?Pharmacy:   ?[id number] [place] - [address]?[id number]?Phone: [phone]: [phone]? ? ? ? ?Social Determinants of Health (SDOH) Interventions ?  ? ?Readmission Risk Interventions ? ?  05/18/2021  ?  3:56 PM  ?Readmission Risk Prevention Plan  ?Transportation Screening Complete  ?PCP or Specialist Appt within 5-7 Days Complete  ?Home Care Screening Complete  ?Medication Review (RN CM) Complete  ? ? ? ?

## 2021-05-18 NOTE — Plan of Care (Signed)
Latest Reference Range & Units 05/18/21 00:28 05/18/21 00:53 05/18/21 01:28 05/18/21 03:32 05/18/21 04:04  ?Glucose-Capillary 70 - 99 mg/dL 65 (L) 72 84 81 73  ?(L): Data is abnormally lowHypoglycemic Event ? ?CBG: 65 ? ?Treatment: 8 oz juice/soda ? ?Symptoms: None ? ?Follow-up CBG: Time:72 CBG Result:82 ? ?Latest cbg 73.  ? ?Possible Reasons for Event: Inadequate meal intake ?Comments/MD notified: dr. Smitty Cords ? ? ? ?[place] ? ? ?

## 2021-05-18 NOTE — Evaluation (Signed)
Occupational Therapy Evaluation ?Patient Details ?Name: Madeline Martinez ?MRN: [id number] ?DOB: 10-Feb-1946 ?Today's Date: 05/18/2021 ? ? ?History of Present Illness 76 year old female with past medical history of insulin-dependent diabetes mellitus type 2, diabetes mellitus stage IIIa, hyperlipidemia, hypertension, gout, moderate persistent asthma, gastroesophageal reflux disease who presents to [facility] emergency department after developing a several day history of confusion, suffering multiple ground-level falls.  ? ?Clinical Impression ?  ?Patient presenting with decreased Ind in self care, balance, functional mobility/transfers, endurance, and safety awareness. Patient reports being mod I at home and living with daughter. Her daughter does not work outside of the home and is available to assist as needed per pt report. Pt endorses use of RW and SPC for functional mobility and that is just "depends" on which one  she want's to use. Pt is A & Ox 4 this session but does have intermittent tangential speech but able to easily redirect to task.  Pt performing mobility, functional transfers, and self care needs with supervision and use of RW during session. No LOB noted. Patient will benefit from acute OT to increase overall independence in the areas of ADLs, functional mobility, and safety awareness in order to safely discharge to next venue of care.  ?   ? ?Recommendations for follow up therapy are one component of a multi-disciplinary discharge planning process, led by the attending physician.  Recommendations may be updated based on patient status, additional functional criteria and insurance authorization.  ? ?Follow Up Recommendations ? Home health OT  ?  ?Assistance Recommended at Discharge Intermittent Supervision/Assistance  ?Patient can return home with the following A little help with bathing/dressing/bathroom;Assistance with cooking/housework;Direct supervision/assist for medications management;Direct  supervision/assist for financial management;Assist for transportation;Help with stairs or ramp for entrance ? ?  ?Functional Status Assessment ? Patient has had a recent decline in their functional status and demonstrates the ability to make significant improvements in function in a reasonable and predictable amount of time.  ?Equipment Recommendations ? None recommended by OT  ?  ?   ?Precautions / Restrictions Precautions ?Precautions: Fall ?Precaution Comments: Blind in R eye  ? ?  ? ?Mobility Bed Mobility ?Overal bed mobility: Modified Independent ?Bed Mobility: Supine to Sit, Sit to Supine ?  ?  ?Supine to sit: Modified independent (Device/Increase time), HOB elevated ?Sit to supine: Modified independent (Device/Increase time), HOB elevated ?  ?General bed mobility comments: extra time and effort but no physical assistance ?  ? ?Transfers ?Overall transfer level: Needs assistance ?Equipment used: Rolling walker (2 wheels) ?Transfers: Sit to/from Stand ?Sit to Stand: Supervision ?  ?  ?  ?  ?  ?  ?  ? ?  ?Balance Overall balance assessment: Needs assistance ?Sitting-balance support: Bilateral upper extremity supported, Feet supported ?Sitting balance-Leahy Scale: Fair ?  ?  ?  ?Standing balance-Leahy Scale: Fair ?  ?  ?  ?  ?  ?  ?  ?  ?  ?  ?  ?  ?   ? ?ADL either performed or assessed with clinical judgement  ? ?ADL Overall ADL's : Needs assistance/impaired ?  ?  ?Grooming: Wash/dry hands;Wash/dry face;Supervision/safety ?  ?  ?  ?  ?  ?  ?  ?  ?  ?Toilet Transfer: Supervision/safety;Standard walker ?  ?Toileting- Clothing Manipulation and Hygiene: Supervision/safety;Sit to/from stand ?  ?  ?  ?Functional mobility during ADLs: Supervision/safety;Rolling walker (2 wheels) ?   ? ? ? 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   ?   ?   ?   ? ?Pertinent Vitals/Pain Pain Assessment ?Pain Assessment: No/denies pain  ? ? ? ?Extremity/Trunk Assessment  Upper Extremity Assessment ?Upper Extremity Assessment: Generalized weakness ?  ?Lower Extremity Assessment ?Lower Extremity Assessment: Generalized weakness ?  ?  ?  ?   ?Cognition Arousal/Alertness: Awake/alert ?Behavior During Therapy: [facility] for tasks assessed/performed ?Overall Cognitive Status: No family/caregiver present to determine baseline cognitive functioning ?  ?  ?  ?  ?  ?  ?  ?  ?  ?  ?  ?  ?  ?  ?  ?  ?General Comments: Pt is A & Ox4. She does have intermittent tangential speech but easily redirected. ?  ?  ?   ?   ?   ? ? ?Home Living Family/patient expects to be discharged to:: Private residence ?Living Arrangements: Children (daughter) ?Available Help at Discharge: Family;Available 24 hours/day ?Type of Home: House ?Home Access: Level entry;Ramped entrance ?  ?  ?Home Layout: Two level;Able to live on main level with bedroom/bathroom ?Alternate Level Stairs-Number of Steps: flight ?  ?Bathroom Shower/Tub: Walk-in shower ?  ?Bathroom Toilet: Handicapped height ?Bathroom Accessibility: Yes ?  ?Home Equipment: Conservation officer, nature (2 wheels);Cane - single point;Shower seat;BSC/3in1 ?  ?  ?  ? ?  ?Prior Functioning/Environment Prior Level of Function : Independent/Modified Independent ?  ?  ?  ?  ?Mobility (physical): Bed mobility;Transfers;Gait ?  ?Mobility Comments: Pt reports mod I with use of RW or SPC for mobility ?ADLs Comments: Pt reports mod I for self care but on ocassional asks for assist from daughter if needed. Daughter performs IADLs at home. ?  ? ?  ?  ?OT Problem List: Decreased strength;Decreased activity tolerance;Impaired balance (sitting and/or standing);Decreased safety awareness;Cardiopulmonary status limiting activity ?  ?   ?OT Treatment/Interventions: Self-care/ADL training;Therapeutic exercise;Patient/family education;Modalities;Manual therapy;Balance training;Energy conservation;Therapeutic activities;DME and/or AE instruction;Cognitive remediation/compensation  ?  ?OT Goals(Current  goals can be found in the care plan section) Acute Rehab OT Goals ?Patient Stated Goal: to go home ?OT Goal Formulation: With patient ?Time For Goal Achievement: 06/01/21 ?Potential to Achieve Goals: Good  ?OT Frequency: Min 2X/week ?  ? ?   ?AM-PAC OT "6 Clicks" Daily Activity     ?Outcome Measure Help from another person eating meals?: None ?Help from another person taking care of personal grooming?: A Little ?Help from another person toileting, which includes using toliet, bedpan, or urinal?: A Little ?Help from another person bathing (including washing, rinsing, drying)?: None ?Help from another person to put on and taking off regular upper body clothing?: None ?Help from another person to put on and taking off regular lower body clothing?: A Little ?6 Click Score: 21 ?  ?End of Session Equipment Utilized During Treatment: Rolling walker (2 wheels) ?Nurse Communication: Mobility status ? ?Activity Tolerance: Patient tolerated treatment well ?Patient left: in bed;with call bell/phone within reach;with bed alarm set ? ?OT Visit Diagnosis: Unsteadiness on feet (R26.81);Repeated falls (R29.6);Muscle weakness (generalized) (M62.81)  ?              ?Time: 0177-9390 ?OT Time Calculation (min): 23 min ?Charges:  OT General Charges ?$OT Visit: 1 Visit ?OT Evaluation ?$OT Eval Moderate Complexity: 1 Mod ?OT Treatments ?$Self Care/Home Management : 8-22 mins ? ?Darleen Crocker, MS, OTR/L , CBIS ?ascom [phone]  ?05/18/21, 1:49 PM  ?

## 2021-05-18 NOTE — Evaluation (Signed)
Clinical/Bedside Swallow Evaluation ?Patient Details  ?Name: Madeline Martinez ?MRN: [id number] ?Date of Birth: 05-07-1945 ? ?Today's Date: 05/18/2021 ?Time: SLP Start Time (ACUTE ONLY): 4235 SLP Stop Time (ACUTE ONLY): 3614 ?SLP Time Calculation (min) (ACUTE ONLY): 15 min ? ?Past Medical History:  ?Past Medical History:  ?Diagnosis Date  ? Acid reflux   ? Anxiety   ? Depression   ? Diabetes mellitus   ? Dysrhythmia   ? Gout   ? Heart murmur   ? HTN (hypertension)   ? Hyperlipidemia   ? Sleep apnea   ? ?Past Surgical History:  ?Past Surgical History:  ?Procedure Laterality Date  ? ABDOMINAL HYSTERECTOMY    ? BACK SURGERY  2007  ? DISCS  ? BIOPSY  10/08/2018  ? Procedure: BIOPSY;  Surgeon: Irving Copas., MD;  Location: [facility];  Service: Gastroenterology;;  ? COLONOSCOPY WITH PROPOFOL N/A 12/19/2017  ? Procedure: COLONOSCOPY WITH PROPOFOL;  Surgeon: Jonathon Bellows, MD;  Location: [facility] END[facility]COPY;  Service: Gastroenterology;  Laterality: N/A;  ? 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 64  ? BILATERAL  ? ENDOSCOPIC MUCOSAL RESECTION N/A 10/08/2018  ? Procedure: ENDOSCOPIC MUCOSAL RESECTION;  Surgeon: Rush Landmark Telford Nab., MD;  Location: [facility] ENDOS[facility];  Service: Gastroenterology;  Laterality: N/A;  ? ESOPHAGOGASTRODUODENOSCOPY (EGD) WITH PROPOFOL N/A 08/28/2018  ? Procedure: ESOPHAGOGASTRODUODENOSCOPY (EGD) WITH PROPOFOL;  Surgeon: Jonathon Bellows, MD;  Location: [facility] ENDOSCOPY;  Service: Gastroenterology;  Laterality: N/A;  ? ESOPHAGOGASTRODUODENOSCOPY (EGD) WITH PROPOFOL N/A 10/08/2018  ? Procedure: ESOPHAGOGASTRODUODENOSCOPY (EGD) WITH PROPOFOL;  Surgeon: Rush Landmark Telford Nab., MD;  Location: [facility];  Service: Gastroenterology;  Laterality: N/A;  ? EUS N/A 10/08/2018  ? Procedure: UPPER ENDOSCOPIC ULTRASOUND (EUS) RADIAL;  Surgeon: Rush Landmark Telford Nab., MD;  Location: [facility] ENDOSCOPY;  Service: Gastroenterology;  Laterality: N/A;  ? PARTIAL HYSTERECTOMY  1986  ? POLYPECTOMY  10/08/2018  ? Procedure: POLYPECTOMY;   Surgeon: Irving Copas., MD;  Location: [facility] ENDOS[facility];  Service: Gastroenterology;;  ? ?HPI:  ?Per H&P "Madeline Martinez is a 76 y.o. female with medical history significant for GERD, depression, diabetes mellitus, hypertension, dyslipidemia who presents to the ER for evaluation of abdominal pain associated with nausea, vomiting, diarrhea and shortness of breath.   Daughter said she developed symptoms 3 days prior to her admission and was unable to tolerate any oral intake except for some Pedialyte.  She also stated that patient had worsening shortness of breath from her baseline and has had episodes of confusion prompting her visit to the ER.  Patient is unable to provide any history but notes that she is in the hospital.  I am unable to do a review of systems on this patient.  Her labs show significant pyuria with a normal white count.  She is afebrile ."  ?  ?Assessment / Plan / Recommendation  ?Clinical Impression ? Pt seen for clinical swallowing evaluation. Pt alert, pleasantly confused at times. Consuming breakfast upon SLP entrance to room. Pt endorsed preference for naturally softer solids due to dental status. ? ?Per chart review, temp and WBC WNL. CXR 05/17/21 "no evidence of acute cardiopulmonary disease."  ?Oral motor examination unremarkable with exception of edentulism. Upper dentures present, pt declined placement.  ? ?Observed pt with consistencies from breakfast tray including pancakes with syrup, coffee, and OJ (via straw). Pt demonstrated any intact oral swallow across trials. Pharyngeal swallow appeared [facility] per clinical assessment. To palpation, pt with seemingly timely swallow initiation and seemingly adequate laryngeal elevation. No overt or subtle s/sx pharyngeal dysphagia. No change  to vocal quality across trials.  ? ?Discussed diet options with pt. Pt requested downgrade to mech soft diet. Given pt's preference, recommend mech soft diet with thin liquids, From SLP perspective, OK for  REGULAR DIET WITH THIN LIQUIDS. Standard aspiration precautions and reduction of environmental distractions recommended given pt's mild confusions. ? ?SLP to sign off as pt without current SLP needs. Suspect pt is at or near swallowing baseline. ? ?Pt and RN made aware of results, recommendations, and SLP POC. Pt verbalized understanding/agreement.  ? ?SLP Visit Diagnosis: Dysphagia, unspecified (R13.10) ?   ?Aspiration Risk ? Mild aspiration risk  ?  ?Diet Recommendation Dysphagia 3 (Mech soft);Thin liquid (per pt preference; OK for REGULAR DIET WITH THIN LIQUIDS FROM SLP PERSPECTIVE)  ? ?Medication Administration:  (as tolerated) ?Supervision: Patient able to self feed ?Compensations: Minimize environmental distractions ?Postural Changes: Seated upright at 90 degrees;Remain upright for at least 30 minutes after po intake  ?  ?Other  Recommendations Oral Care Recommendations: Oral care BID;Patient independent with oral care (set up)   ? ?Recommendations for follow up therapy are one component of a multi-disciplinary discharge planning process, led by the attending physician.  Recommendations may be updated based on patient status, additional functional criteria and insurance authorization. ? ?Follow up Recommendations No SLP follow up  ? ? ?  ?Assistance Recommended at Discharge Intermittent Supervision/Assistance  ?Functional Status Assessment Patient has not had a recent decline in their functional status  ?   ? ?Prognosis Prognosis for Safe Diet Advancement: Good  ? ?  ? ?Swallow Study   ?General Date of Onset: 05/17/21 ?HPI: Per H&P "Madeline Martinez is a 76 y.o. female with medical history significant for GERD, depression, diabetes mellitus, hypertension, dyslipidemia who presents to the ER for evaluation of abdominal pain associated with nausea, vomiting, diarrhea and shortness of breath.   Daughter said she developed symptoms 3 days prior to her admission and was unable to tolerate any oral intake except for  some Pedialyte.  She also stated that patient had worsening shortness of breath from her baseline and has had episodes of confusion prompting her visit to the ER.  Patient is unable to provide any history but notes that she is in the hospital.  I am unable to do a review of systems on this patient.  Her labs show significant pyuria with a normal white count.  She is afebrile ." ?Type of Study: Bedside Swallow Evaluation ?Previous Swallow Assessment: unknown ?Diet Prior to this Study: Regular;Thin liquids ?Temperature Spikes Noted: No ?Respiratory Status: Non-rebreather ?History of Recent Intubation: No ?Behavior/Cognition: Alert;Cooperative;Pleasant mood;Confused ?Oral Cavity Assessment: Within Functional Limits ?Oral Care Completed by SLP: Yes ?Oral Cavity - Dentition: Dentures, top;Edentulous (+upper dentures; pt declined placement; edentulous for evaluation) ?Vision: Functional for self-feeding (blind R eye) ?Self-Feeding Abilities: Able to feed self ?Patient Positioning: Upright in bed ?Baseline Vocal Quality: Normal ?Volitional Cough: Strong ?Volitional Swallow: Able to elicit  ?  ?Oral/Motor/Sensory Function Overall Oral Motor/Sensory Function: Within functional limits   ?Ice Chips     ?Thin Liquid Thin Liquid: Within functional limits ?Presentation: Cup;Straw;Self Fed  ?  ?Solid ? ? ?  Solid: Within functional limits ?Presentation: Self Fed  ? ?  ?Cherrie Gauze, M.S., CCC-SLP ?Speech-Language Pathologist ?[facility] ?[phone] ([place])  ? ?Quintella Baton ?05/18/2021,10:36 AM ? ? ? ?

## 2021-05-18 NOTE — Assessment & Plan Note (Addendum)
?   Patient exhibiting recurrent bouts of hypoglycemia since arrival ?? Likely secondary to slowly metabolized basal insulin in the setting of acute kidney injury ?? Holding home regimen of basal insulin therapy ?? Placing patient on D5 LR infusion ?? Performing Accu-Cheks every 4 hours for now. ?

## 2021-05-18 NOTE — Progress Notes (Addendum)
?PROGRESS NOTE ? ? ? Madeline Martinez  ELF:[id number] DOB: [birth year] DOA: 05/17/2021 ?PCP: [name], MD  ? ? ?Brief Narrative:  ?76 year old female with past medical history of insulin-dependent diabetes mellitus type 2, diabetes mellitus stage IIIa, hyperlipidemia, hypertension, gout, moderate persistent asthma, gastroesophageal reflux disease who presents to [facility] emergency department after developing a several day history of confusion, suffering multiple ground-level falls.  ?  ?Patient is a poor historian and is unable to provide significant history due to confusion. ?  ?Of note, patient was recently hospitalized at [facility] from 3/7 until 3/9 for acute metabolic encephalopathy thought to be secondary to Klebsiella urinary tract infection.  Patient was treated initially with intravenous antibiotics and intravenous fluids but was later switched to oral Keflex prior to discharge. ?  ?Patient now presents back to the emergency department with family due to a several day history of progressively worsening lethargy and confusion.  According to family, patient is seemingly speaking to family members that are not present which was quite disconcerting to them.  Patient has additionally been exhibiting increasingly worsening oral intake over the span of time.  Family denies any evidence of vomiting, diarrhea, complaints of pain or fever as of late. ?  ?In the past 24 hours the patient has begun to fall and the family counts approximately 6 separate times that the patient has experienced a fall.  There is no associated loss of consciousness.  Due to progressively worsening lethargy and confusion with frequent falls patient was eventually brought into [facility] emergency department for evaluation. ?  ?Upon evaluation in the emergency department noncontrast CT imaging of the head was unremarkable.  Chest x-ray revealed no evidence of pneumonia and COVID-19 PCR testing was negative.  Clinically, the patient was felt to be volume  depleted and therefore was initiated on intravenous fluids.  Patient was additionally found to be suffering from acute kidney injury with creatinine of 1.76, up from 0.97.  Due to frequent falls and acute kidney injury the hospitalist group has been called to assess the patient for admission to the hospital. ? ? ?Assessment & Plan: ?  ?Principal Problem: ?  Fall at home, initial encounter ?Active Problems: ?  Acute metabolic encephalopathy ?  Acute renal failure superimposed on stage 3a chronic kidney disease ([place]) ?  Essential hypertension ?  Hypoglycemia ?  Type 2 diabetes mellitus with stage 3a chronic kidney disease, with long-term current use of insulin ([place]) ?  Mixed diabetic hyperlipidemia associated with type 2 diabetes mellitus ([place]) ?  Hypothyroidism ? ?Fall at home, initial encounter ?Approximately 6 falls in the past 24 hours according to family ?Likely secondary to unsteady gait due to weakness and volume depletion ?MRI brain unrevealing ?Plan: ?Fall precautions ?Therapy evaluations ?IV fluid hydration ?  ?Acute metabolic encephalopathy ?Patient presenting with several day history of recurrent confusion lethargy, hallucinations and poor oral intake ?Etiology is not yet clear although volume depletion is certainly a contributing factor ?TSH normal ?Ammonia normal ?CT and MRI negative ?Plan: ?Avoid sedating medications ?Follow-up B12, inflammatory markers, RPR ?Frequent reorientation ?  ?Acute renal failure superimposed on stage 3a chronic kidney disease ([place]) ?Felt to be prerenal secondary to volume depletion ?Urinalysis not indicative of infection ?Plan: ?IV hydration as above ?Monitor renal function ?Avoid nonessential nephrotoxins ?  ?Hypoglycemia ?Patient exhibiting recurrent bouts of hypoglycemia since arrival ?Likely secondary to slowly metabolized basal insulin in the setting of acute kidney injury ?Plan: ?Hold home insulin ?Continue D5 LR for now ?Every 4 hours CBGs for  now ?  ?Essential  hypertension ?Continue home regimen of Coreg ?Holding home regimen of losartan due to acute kidney injury ?As needed intravenous and hypertensives for markedly elevated blood pressure. ?  ?Type 2 diabetes mellitus with stage 3a chronic kidney disease, with long-term current use of insulin ([place]) ?Patient been placed on Accu-Cheks before every meal and nightly with sliding scale insulin ?Holding home regimen of basal insulin therapy due to bouts of hypoglycemia ?Hemoglobin A1c noted to be 6.7% during prior hospitalization  ?diabetic Diet as tolerated ?  ?  ?  ?Mixed diabetic hyperlipidemia associated with type 2 diabetes mellitus ([place]) ?Continuing home regimen of lipid lowering therapy. ?  ?  ?Hypothyroidism ?Resume home regimen of Synthroid ? ? ? ?DVT prophylaxis: SQ Lovenox ?Code Status: Full ?Family Communication: daughter [name] [phone] on 3/23 ?Disposition Plan: Status is: Observation ?The patient will require care spanning > 2 midnights and should be moved to inpatient because: AKI, frequent falls, acute metabolic encephalopathy of unclear etiology ? ? ? ? ?Level of care: Telemetry Medical ? ?Consultants:  ?None ? ?Procedures:  ?None ? ?Antimicrobials: ?None ? ? ?Subjective: ?Seen and examined.  Slightly confused.  Alert oriented to person and time ? ?Objective: ?Vitals:  ? 05/17/21 2200 05/17/21 2343 05/18/21 0409 05/18/21 0756  ?BP: 126/68 122/68 134/75 (!) 144/71  ?Pulse: 77 77 84 83  ?Resp: '17 14 18 18  '$ ?Temp: 98.8 ?F (37.1 ?C) 97.9 ?F (36.6 ?C) 98.4 ?F (36.9 ?C) 98.5 ?F (36.9 ?C)  ?TempSrc: Oral     ?SpO2: 97% 99% 94% 98%  ?Weight:      ?Height:      ? ? ?Intake/Output Summary (Last 24 hours) at 05/18/2021 1002 ?Last data filed at 05/18/2021 0604 ?Gross per 24 hour  ?Intake 595.04 ml  ?Output 475 ml  ?Net 120.04 ml  ? ?Filed Weights  ? 05/17/21 1943  ?Weight: 86.2 kg  ? ? ?Examination: ? ?General exam: No acute distress.  Appears comfortable ?Respiratory system: Lungs clear.  Normal work of breathing.   Room air ?Cardiovascular system: S1-S2, RRR, no murmurs, no pedal edema ?Gastrointestinal system: Soft, NT/ND, normal bowel sounds ?Central nervous system: Alert, oriented x2, no focal deficits ?Extremities: Symmetric 5 x 5 power. ?Skin: No rashes, lesions or ulcers ?Psychiatry: Judgement and insight appear impaired. Mood & affect confused.  ? ? ? ?Data Reviewed: I have personally reviewed following labs and imaging studies ? ?CBC: ?Recent Labs  ?Lab 05/17/21 ?1947 05/18/21 ?0512  ?WBC 7.8 7.4  ?NEUTROABS  --  4.4  ?HGB 11.3* 10.8*  ?HCT 37.0 35.1*  ?MCV 97.4 96.4  ?PLT 353 336  ? ?Basic Metabolic Panel: ?Recent Labs  ?Lab 05/17/21 ?1947 05/18/21 ?0512  ?NA 136 139  ?K 4.5 4.5  ?CL 101 104  ?CO2 26 28  ?GLUCOSE 102* 95  ?BUN 41* 36*  ?CREATININE 1.76* 1.41*  ?CALCIUM 8.9 9.1  ?MG  --  2.3  ? ?GFR: ?Estimated Creatinine Clearance: 34.9 mL/min (A) (by C-G formula based on SCr of 1.41 mg/dL (H)). ?Liver Function Tests: ?Recent Labs  ?Lab 05/18/21 ?0512  ?AST 21  ?ALT 22  ?ALKPHOS 51  ?BILITOT 0.5  ?PROT 6.9  ?ALBUMIN 3.6  ? ?No results for input(s): LIPASE, AMYLASE in the last 168 hours. ?Recent Labs  ?Lab 05/18/21 ?6546  ?AMMONIA 16  ? ?Coagulation Profile: ?Recent Labs  ?Lab 05/17/21 ?1947  ?INR 1.0  ? ?Cardiac Enzymes: ?Recent Labs  ?Lab 05/17/21 ?1947  ?CKTOTAL 75  ? ?BNP (last 3 results) ?  No results for input(s): PROBNP in the last 8760 hours. ?HbA1C: ?No results for input(s): HGBA1C in the last 72 hours. ?CBG: ?Recent Labs  ?Lab 05/18/21 ?0128 05/18/21 ?0240 05/18/21 ?0404 05/18/21 ?9735 05/18/21 ?3299  ?GLUCAP 84 81 73 108* 105*  ? ?Lipid Profile: ?No results for input(s): CHOL, HDL, LDLCALC, TRIG, CHOLHDL, LDLDIRECT in the last 72 hours. ?Thyroid Function Tests: ?Recent Labs  ?  05/18/21 ?0512  ?TSH 2.130  ? ?Anemia Panel: ?Recent Labs  ?  05/18/21 ?0512  ?FOLATE 29.0  ? ?Sepsis Labs: ?No results for input(s): PROCALCITON, LATICACIDVEN in the last 168 hours. ? ?Recent Results (from the past 240 hour(s))  ?Resp  Panel by RT-PCR (Flu A&B, Covid) Nasopharyngeal Swab     Status: None  ? Collection Time: 05/17/21  9:52 PM  ? Specimen: Nasopharyngeal Swab; Nasopharyngeal(NP) swabs in vial transport medium  ?Result Value Ref

## 2021-05-18 NOTE — Progress Notes (Addendum)
Initial Nutrition Assessment ? ?DOCUMENTATION CODES:  ? ?Obesity unspecified ? ?INTERVENTION:  ? ?-Continue dysphagia 3 diet with thin liquids ?-Ensure Enlive po BID, each supplement provides 350 kcal and 20 grams of protein ?-MVI with minerals daily ? ?NUTRITION DIAGNOSIS:  ? ?Inadequate oral intake related to decreased appetite as evidenced by per patient/family report. ? ?GOAL:  ? ?Patient will meet greater than or equal to 90% of their needs ? ?MONITOR:  ? ?PO intake, Supplement acceptance, Diet advancement, Labs, Weight trends, Skin, I & O's ? ?REASON FOR ASSESSMENT:  ? ?Consult ?Assessment of nutrition requirement/status ? ?ASSESSMENT:  ? ?76 year old female with past medical history of insulin-dependent diabetes mellitus type 2, diabetes mellitus stage IIIa, hyperlipidemia, hypertension, gout, moderate persistent asthma, gastroesophageal reflux disease who presents to [facility] emergency department after developing a several day history of confusion, suffering multiple ground-level falls. ? ?Pt admitted with fall and acute metabolic encephalopathy.  ? ?Reviewed I/O's: +120 ml x 24 hours ? ?UOP: 475 ml x 24 hours ? ?Spoke with pt at bedside, who awoke when RD called her name. She reports she got a good night's sleep. Pt shares that she has not eaten anything today (breakfast tray on tray table untouched). RD assisted pt with setting up tray.   ? ?Pt explains that she was recently discharged from the hospital and had been working with physical therapy ("I work really hard and I want to do everything right"). Pt typically has dentures at home, but does not currently have access to them and is requesting a mechanically altered diet for ease of intake. Per pt, she usually consumes 3 small meals PTA that she takes with her medicine (toast, boiled chicken, peas, and coffee). Pt shares "you have to eat things you don't like, such as vegetables, if you want to live".  ? ?Pt unsure of UBW, but suspects she may have lost  weight. Reviewed wt hx; pt has experienced a 6.9% wt loss over the past month, which is significant for time frame.  ? ?Discussed importance of good meal and supplement intake to promote healing. Pt amenable to diet downgrade and supplements.  ? ?Pt with poor oral intake and would benefit from nutrient dense supplement. One Ensure Enlive supplement provides 350 kcals, 20 grams protein, and 44-45 grams of carbohydrate vs one Glucerna shake supplement, which provides 220 kcals, 10 grams of protein, and 26 grams of carbohydrate. Given pt's hx of DM, RD will reassess adequacy of PO intake, CBGS, and adjust supplement regimen as appropriate at follow-up. Noted pt with hypoglycemic episode this AM.  ? ?Medications reviewed and include vitamin C, ferrous sulfate, and dextrose 5% in lactated ringers infusion @ 100 ml/hr.   ? ?Lab Results  ?Component Value Date  ? HGBA1C 6.7 (H) 05/02/2021  ? PTA DM medications are 15 units tresiba daily and 1000 mg metformin BID.  ? ?Labs reviewed: CBGS: 73-108 (inpatient orders for glycemic control are 0-9 units insulin aspart every 4 hours).   ? ?NUTRITION - FOCUSED PHYSICAL EXAM: ? ?Flowsheet Row Most Recent Value  ?Orbital Region No depletion  ?Upper Arm Region No depletion  ?Thoracic and Lumbar Region No depletion  ?Buccal Region No depletion  ?Temple Region No depletion  ?Clavicle Bone Region No depletion  ?Clavicle and Acromion Bone Region No depletion  ?Scapular Bone Region No depletion  ?Dorsal Hand No depletion  ?Patellar Region No depletion  ?Anterior Thigh Region No depletion  ?Posterior Calf Region No depletion  ?Edema (RD Assessment) Mild  ?Hair  Reviewed  ?Eyes Reviewed  ?Mouth Reviewed  ?Skin Reviewed  ?Nails Reviewed  ? ?  ? ? ?Diet Order:   ?Diet Order   ? ?       ?  DIET DYS 3 Room service appropriate? Yes; Fluid consistency: Thin  Diet effective now       ?  ? ?  ?  ? ?  ? ? ?EDUCATION NEEDS:  ? ?Education needs have been addressed ? ?Skin:  Skin Assessment: Reviewed RN  Assessment ? ?Last BM:  05/18/21 ? ?Height:  ? ?Ht Readings from Last 1 Encounters:  ?05/17/21 '5\' 1"'$  (1.549 m)  ? ? ?Weight:  ? ?Wt Readings from Last 1 Encounters:  ?05/17/21 86.2 kg  ? ? ?Ideal Body Weight:  47.7 kg ? ?BMI:  Body mass index is 35.9 kg/m?. ? ?Estimated Nutritional Needs:  ? ?Kcal:  1500-1700 ? ?Protein:  80-95 grams ? ?Fluid:  > 1.5 L ? ? ? ?Loistine Chance, RD, LDN, CDCES ?Registered Dietitian II ?Certified Diabetes Care and Education Specialist ?Please refer to [facility] for RD and/or RD on-call/weekend/after hours pager  ?

## 2021-05-19 LAB — BASIC METABOLIC PANEL
Anion gap: 10 (ref 5–15)
BUN: 15 mg/dL (ref 8–23)
CO2: 23 mmol/L (ref 22–32)
Calcium: 9.2 mg/dL (ref 8.9–10.3)
Chloride: 106 mmol/L (ref 98–111)
Creatinine, Ser: 0.88 mg/dL (ref 0.44–1.00)
GFR, Estimated: >60 mL/min (ref >60)
Glucose, Bld: 172 mg/dL — ABNORMAL HIGH (ref 70–99)
Potassium: 4 mmol/L (ref 3.5–5.1)
Sodium: 139 mmol/L (ref 135–145)

## 2021-05-19 LAB — CBC WITH DIFFERENTIAL/PLATELET
Abs Immature Granulocytes: 0.02 10*3/uL (ref 0.00–0.07)
Basophils Absolute: 0 10*3/uL (ref 0.0–0.1)
Basophils Relative: 1 %
Eosinophils Absolute: 0.2 10*3/uL (ref 0.0–0.5)
Eosinophils Relative: 4 %
HCT: 36.2 % (ref 36.0–46.0)
Hemoglobin: 11.5 g/dL — ABNORMAL LOW (ref 12.0–15.0)
Immature Granulocytes: 0 %
Lymphocytes Relative: 29 %
Lymphs Abs: 1.5 10*3/uL (ref 0.7–4.0)
MCH: 29.8 pg (ref 26.0–34.0)
MCHC: 31.8 g/dL (ref 30.0–36.0)
MCV: 93.8 fL (ref 80.0–100.0)
Monocytes Absolute: 0.5 10*3/uL (ref 0.1–1.0)
Monocytes Relative: 10 %
Neutro Abs: 2.9 10*3/uL (ref 1.7–7.7)
Neutrophils Relative %: 56 %
Platelets: 363 10*3/uL (ref 150–400)
RBC: 3.86 MIL/uL — ABNORMAL LOW (ref 3.87–5.11)
RDW: 14.8 % (ref 11.5–15.5)
WBC: 5.2 10*3/uL (ref 4.0–10.5)
nRBC: 0 % (ref 0.0–0.2)

## 2021-05-19 LAB — GLUCOSE, CAPILLARY
Glucose-Capillary: 145 mg/dL — ABNORMAL HIGH (ref 70–99)
Glucose-Capillary: 157 mg/dL — ABNORMAL HIGH (ref 70–99)
Glucose-Capillary: 164 mg/dL — ABNORMAL HIGH (ref 70–99)
Glucose-Capillary: 168 mg/dL — ABNORMAL HIGH (ref 70–99)
Glucose-Capillary: 185 mg/dL — ABNORMAL HIGH (ref 70–99)
Glucose-Capillary: 207 mg/dL — ABNORMAL HIGH (ref 70–99)

## 2021-05-19 LAB — MAGNESIUM: Magnesium: 1.7 mg/dL (ref 1.7–2.4)

## 2021-05-19 MED ORDER — TRAZODONE HCL 50 MG PO TABS
50.0000 mg | ORAL_TABLET | Freq: Every day | ORAL | Status: DC
Start: 2021-05-19 — End: 2021-05-20
  Administered 2021-05-19: 50 mg via ORAL
  Filled 2021-05-19: qty 1

## 2021-05-19 MED ORDER — HYDROCODONE-ACETAMINOPHEN 5-325 MG PO TABS
1.0000 | ORAL_TABLET | Freq: Two times a day (BID) | ORAL | Status: DC | PRN
  Administered 2021-05-19 – 2021-05-20 (×2): 1 via ORAL
  Filled 2021-05-19 (×2): qty 1

## 2021-05-19 MED ORDER — METOCLOPRAMIDE HCL 5 MG/ML IJ SOLN
10.0000 mg | Freq: Three times a day (TID) | INTRAMUSCULAR | Status: DC
  Administered 2021-05-19 – 2021-05-20 (×3): 10 mg via INTRAVENOUS
  Filled 2021-05-19 (×3): qty 2

## 2021-05-19 MED ORDER — LACTATED RINGERS IV SOLN: INTRAVENOUS | Status: DC

## 2021-05-19 NOTE — Progress Notes (Signed)
?PROGRESS NOTE ? ? ? Madeline Martinez  WGY:[id number] DOB: [birth year] DOA: 05/17/2021 ?PCP: [name], MD  ? ? ?Brief Narrative:  ?76 year old female with past medical history of insulin-dependent diabetes mellitus type 2, diabetes mellitus stage IIIa, hyperlipidemia, hypertension, gout, moderate persistent asthma, gastroesophageal reflux disease who presents to [facility] emergency department after developing a several day history of confusion, suffering multiple ground-level falls.  ?  ?Patient is a poor historian and is unable to provide significant history due to confusion. ?  ?Of note, patient was recently hospitalized at [facility] from 3/7 until 3/9 for acute metabolic encephalopathy thought to be secondary to Klebsiella urinary tract infection.  Patient was treated initially with intravenous antibiotics and intravenous fluids but was later switched to oral Keflex prior to discharge. ?  ?Patient now presents back to the emergency department with family due to a several day history of progressively worsening lethargy and confusion.  According to family, patient is seemingly speaking to family members that are not present which was quite disconcerting to them.  Patient has additionally been exhibiting increasingly worsening oral intake over the span of time.  Family denies any evidence of vomiting, diarrhea, complaints of pain or fever as of late. ?  ?In the past 24 hours the patient has begun to fall and the family counts approximately 6 separate times that the patient has experienced a fall.  There is no associated loss of consciousness.  Due to progressively worsening lethargy and confusion with frequent falls patient was eventually brought into [facility] emergency department for evaluation. ?  ?Upon evaluation in the emergency department noncontrast CT imaging of the head was unremarkable.  Chest x-ray revealed no evidence of pneumonia and COVID-19 PCR testing was negative.  Clinically, the patient was felt to be volume  depleted and therefore was initiated on intravenous fluids.  Patient was additionally found to be suffering from acute kidney injury with creatinine of 1.76, up from 0.97.  Due to frequent falls and acute kidney injury the hospitalist group has been called to assess the patient for admission to the hospital. ? ? ?Assessment & Plan: ?  ?Principal Problem: ?  Fall at home, initial encounter ?Active Problems: ?  Acute metabolic encephalopathy ?  Acute renal failure superimposed on stage 3a chronic kidney disease ([place]) ?  Essential hypertension ?  Hypoglycemia ?  Type 2 diabetes mellitus with stage 3a chronic kidney disease, with long-term current use of insulin ([place]) ?  Mixed diabetic hyperlipidemia associated with type 2 diabetes mellitus ([place]) ?  Hypothyroidism ? ?Fall at home, initial encounter ?Approximately 6 falls in the past 24 hours according to family ?Likely secondary to unsteady gait due to weakness and volume depletion ?MRI brain unrevealing ?Suspect polypharmacy ?Plan: ?Fall precautions ?Therapy evaluations ?Continue IVF for today ?  ?Acute metabolic encephalopathy ?Patient presenting with several day history of recurrent confusion lethargy, hallucinations and poor oral intake ?Etiology is not yet clear although volume depletion is certainly a contributing factor ?TSH normal ?Ammonia normal ?CT and MRI negative ?B12, inflammatory markers, RPR unrevealing ?Plan: ?Avoid sedating medications ?Minimize use of narcotics, benzodiazepines, neuropathic agents ?  ?Acute renal failure superimposed on stage 3a chronic kidney disease ([place]) ?Felt to be prerenal secondary to volume depletion ?Urinalysis not indicative of infection ?Kidney function returned to normal ?Plan: ?Continue IVF as above ?Monitor renal function ?Avoid nonessential nephrotoxins ?  ?Hypoglycemia ?Patient exhibiting recurrent bouts of hypoglycemia since arrival ?Likely secondary to slowly metabolized basal insulin in the setting of acute kidney  injury ?  Plan: ?Hold home insulin ?Lactated Ringer's as above ?  ?Essential hypertension ?Continue home regimen of Coreg ?Continue holding losartan for now ?Avoid hypotension ?As needed intravenous hypertensives for markedly elevated blood pressure. ?  ?Type 2 diabetes mellitus with stage 3a chronic kidney disease, with long-term current use of insulin ([place]) ?Patient been placed on Accu-Cheks before every meal and nightly with sliding scale insulin ?Holding home regimen of basal insulin therapy due to bouts of hypoglycemia ?Hemoglobin A1c noted to be 6.7% during prior hospitalization  ?diabetic Diet as tolerated ?  ?  ?  ?Mixed diabetic hyperlipidemia associated with type 2 diabetes mellitus ([place]) ?Continuing home regimen of lipid lowering therapy. ?  ?  ?Hypothyroidism ?Resume home regimen of Synthroid ? ? ? ?DVT prophylaxis: SQ Lovenox ?Code Status: Full ?Family Communication: daughter [name] [phone] on 3/23, at bedside 3/24 ?Disposition Plan: Status is: Inpatient ?Remains inpatient appropriate because: Acute metabolic encephalopathy.  Poor p.o. intake.  Anticipated date of discharge 3/25. ? ? ? ? ? ? ?Level of care: Telemetry Medical ? ?Consultants:  ?None ? ?Procedures:  ?None ? ?Antimicrobials: ?None ? ? ?Subjective: ?Seen and examined.  Slightly confused.  Alert oriented to person and time.  Daughter at bedside ? ?Objective: ?Vitals:  ? 05/19/21 0000 05/19/21 0300 05/19/21 0403 05/19/21 0807  ?BP: (!) 145/75  (!) 157/81 (!) 147/64  ?Pulse: 84  83 74  ?Resp:   18 16  ?Temp: 98.5 ?F (36.9 ?C) 98 ?F (36.7 ?C) 98 ?F (36.7 ?C) 98.3 ?F (36.8 ?C)  ?TempSrc: Oral Oral Oral Oral  ?SpO2: 98%  98% 96%  ?Weight:      ?Height:      ? ? ?Intake/Output Summary (Last 24 hours) at 05/19/2021 1105 ?Last data filed at 05/18/2021 1600 ?Gross per 24 hour  ?Intake 923.25 ml  ?Output --  ?Net 923.25 ml  ? ?Filed Weights  ? 05/17/21 1943  ?Weight: 86.2 kg  ? ? ?Examination: ? ?General exam: NAD ?Respiratory system: Lungs clear.   Normal work of breathing.  Room air ?Cardiovascular system: S1-S2, RRR, no murmurs, no pedal edema ?Gastrointestinal system: Soft, NT/ND, normal bowel sounds ?Central nervous system: Oriented x2, no focal deficits ?Extremities: Symmetric 5 x 5 power. ?Skin: No rashes, lesions or ulcers ?Psychiatry: Judgement and insight appear impaired. Mood & affect confused.  ? ? ? ?Data Reviewed: I have personally reviewed following labs and imaging studies ? ?CBC: ?Recent Labs  ?Lab 05/17/21 ?1947 05/18/21 ?8144 05/19/21 ?8185  ?WBC 7.8 7.4 5.2  ?NEUTROABS  --  4.4 2.9  ?HGB 11.3* 10.8* 11.5*  ?HCT 37.0 35.1* 36.2  ?MCV 97.4 96.4 93.8  ?PLT 353 336 363  ? ?Basic Metabolic Panel: ?Recent Labs  ?Lab 05/17/21 ?1947 05/18/21 ?6314 05/19/21 ?9702  ?NA 136 139 139  ?K 4.5 4.5 4.0  ?CL 101 104 106  ?CO2 '26 28 23  '$ ?GLUCOSE 102* 95 172*  ?BUN 41* 36* 15  ?CREATININE 1.76* 1.41* 0.88  ?CALCIUM 8.9 9.1 9.2  ?MG  --  2.3 1.7  ? ?GFR: ?Estimated Creatinine Clearance: 56 mL/min (by C-G formula based on SCr of 0.88 mg/dL). ?Liver Function Tests: ?Recent Labs  ?Lab 05/18/21 ?0512  ?AST 21  ?ALT 22  ?ALKPHOS 51  ?BILITOT 0.5  ?PROT 6.9  ?ALBUMIN 3.6  ? ?No results for input(s): LIPASE, AMYLASE in the last 168 hours. ?Recent Labs  ?Lab 05/18/21 ?6378  ?AMMONIA 16  ? ?Coagulation Profile: ?Recent Labs  ?Lab 05/17/21 ?1947  ?INR 1.0  ? ?Cardiac Enzymes: ?  Recent Labs  ?Lab 05/17/21 ?1947  ?CKTOTAL 75  ? ?BNP (last 3 results) ?No results for input(s): PROBNP in the last 8760 hours. ?HbA1C: ?No results for input(s): HGBA1C in the last 72 hours. ?CBG: ?Recent Labs  ?Lab 05/18/21 ?1613 05/18/21 ?2017 05/19/21 ?0004 05/19/21 ?0320 05/19/21 ?0756  ?GLUCAP 281* 181* 207* 185* 157*  ? ?Lipid Profile: ?No results for input(s): CHOL, HDL, LDLCALC, TRIG, CHOLHDL, LDLDIRECT in the last 72 hours. ?Thyroid Function Tests: ?Recent Labs  ?  05/18/21 ?0512  ?TSH 2.130  ? ?Anemia Panel: ?Recent Labs  ?  05/18/21 ?1282 05/18/21 ?0813  ?[id number]  --  268  ?FOLATE  29.0  --   ? ?Sepsis Labs: ?No results for input(s): PROCALCITON, LATICACIDVEN in the last 168 hours. ? ?Recent Results (from the past 240 hour(s))  ?Resp Panel by RT-PCR (Flu A&B, Covid) Nasopharyngeal Swab

## 2021-05-19 NOTE — Progress Notes (Signed)
Occupational Therapy Treatment ?Patient Details ?Name: Madeline Martinez ?MRN: [id number] ?DOB: 03-15-1945 ?Today's Date: 05/19/2021 ? ? ?History of present illness 76 year old female with past medical history of insulin-dependent diabetes mellitus type 2, diabetes mellitus stage IIIa, hyperlipidemia, hypertension, gout, moderate persistent asthma, gastroesophageal reflux disease who presents to [facility] emergency department after developing a several day history of confusion, suffering multiple falls. ?  ?OT comments ? Upon entering the room, pt supine in bed and asking for assistance to go to bathroom. Pt reports feeling unwell and being incontinent in bed. Pt performed bed mobility without physical assistance. Chux pad is soiled and changed by therapist. Pt ambulates to bathroom and has multiple bouts of diarrhea on commode. She is able to stand and thoroughly clean herself with close supervision for safety and changes gown with set up A. Pt returning to bed at end of session secondary to not feeling well. All needs within reach. Pt continues to benefit from OT intervention.   ? ?Recommendations for follow up therapy are one component of a multi-disciplinary discharge planning process, led by the attending physician.  Recommendations may be updated based on patient status, additional functional criteria and insurance authorization. ?   ?Follow Up Recommendations ? Home health OT  ?  ?Assistance Recommended at Discharge Intermittent Supervision/Assistance  ?Patient can return home with the following ? A little help with bathing/dressing/bathroom;Assistance with cooking/housework;Direct supervision/assist for medications management;Direct supervision/assist for financial management;Assist for transportation;Help with stairs or ramp for entrance ?  ?Equipment Recommendations ? None recommended by OT  ?  ?   ?Precautions / Restrictions Precautions ?Precautions: Fall ?Precaution Comments: Blind in R eye ?Restrictions ?Weight  Bearing Restrictions: No  ? ? ?  ? ?Mobility Bed Mobility ?Overal bed mobility: Modified Independent ?  ?  ?  ?Supine to sit: Modified independent (Device/Increase time), HOB elevated ?Sit to supine: Modified independent (Device/Increase time), HOB elevated ?  ?General bed mobility comments: extra time and effort but no physical assistance ?  ? ?Transfers ?Overall transfer level: Needs assistance ?  ?Transfers: Sit to/from Stand ?Sit to Stand: Supervision ?  ?  ?  ?  ?  ?  ?  ?  ?Balance Overall balance assessment: Needs assistance ?Sitting-balance support: Bilateral upper extremity supported, Feet supported ?Sitting balance-Leahy Scale: Good ?  ?  ?Standing balance support: Bilateral upper extremity supported ?Standing balance-Leahy Scale: Fair ?  ?  ?  ?  ?  ?  ?  ?  ?  ?  ?  ?  ?   ? ?ADL either performed or assessed with clinical judgement  ? ?ADL Overall ADL's : Needs assistance/impaired ?  ?  ?Grooming: Wash/dry hands;Wash/dry face;Supervision/safety ?  ?  ?  ?  ?  ?  ?  ?  ?  ?Toilet Transfer: Supervision/safety;Standard walker ?  ?Toileting- Clothing Manipulation and Hygiene: Supervision/safety;Sit to/from stand ?  ?  ?  ?  ?  ?  ? ?Extremity/Trunk Assessment Upper Extremity Assessment ?Upper Extremity Assessment: Overall WFL for tasks assessed ?  ?Lower Extremity Assessment ?Lower Extremity Assessment: Overall WFL for tasks assessed ?  ?  ?  ? ?Vision Baseline Vision/History: 2 Legally blind ?Patient Visual Report: No change from baseline ?  ?  ?   ?   ? ?Cognition Arousal/Alertness: Awake/alert ?Behavior During Therapy: [facility] for tasks assessed/performed ?Overall Cognitive Status: No family/caregiver present to determine baseline cognitive functioning ?  ?  ?  ?  ?  ?  ?  ?  ?  ?  ?  ?  ?  ?  ?  ?  ?  ?  ?  ?   ?   ?   ?   ? ? ?  Pertinent Vitals/ Pain       Pain Assessment ?Pain Assessment: 0-10 ?Pain Score: 4  ?Pain Location: stomach ?Pain Descriptors / Indicators: Cramping ?Pain Intervention(s): Limited  activity within patient's tolerance, Repositioned ? ?   ?   ? ?Frequency ? Min 2X/week  ? ? ? ? ?  ?Progress Toward Goals ? ?OT Goals(current goals can now be found in the care plan section) ? Progress towards OT goals: Progressing toward goals ? ?Acute Rehab OT Goals ?Patient Stated Goal: to go home ?OT Goal Formulation: With patient ?Time For Goal Achievement: 06/01/21 ?Potential to Achieve Goals: Good  ?Plan Discharge plan remains appropriate;Frequency remains appropriate   ? ?   ?AM-PAC OT "6 Clicks" Daily Activity     ?Outcome Measure ? ? Help from another person eating meals?: None ?Help from another person taking care of personal grooming?: A Little ?Help from another person toileting, which includes using toliet, bedpan, or urinal?: A Little ?Help from another person bathing (including washing, rinsing, drying)?: None ?Help from another person to put on and taking off regular upper body clothing?: None ?Help from another person to put on and taking off regular lower body clothing?: A Little ?6 Click Score: 21 ? ?  ?End of Session   ? ?OT Visit Diagnosis: Unsteadiness on feet (R26.81);Repeated falls (R29.6);Muscle weakness (generalized) (M62.81) ?  ?Activity Tolerance Patient tolerated treatment well ?  ?Patient Left in bed;with call bell/phone within reach;with bed alarm set ?  ?Nurse Communication Mobility status ?  ? ?   ? ?Time: 1610-9604 ?OT Time Calculation (min): 14 min ? ?Charges: OT General Charges ?$OT Visit: 1 Visit ?OT Treatments ?$Self Care/Home Management : 8-22 mins ? ?Darleen Crocker, [place], OTR/L , CBIS ?ascom [phone]  ?05/19/21, 3:07 PM  ?

## 2021-05-19 NOTE — Progress Notes (Signed)
Physical Therapy Treatment ?Patient Details ?Name: Madeline Martinez ?MRN: [id number] ?DOB: 1946/02/05 ?Today's Date: 05/19/2021 ? ? ?History of Present Illness 76 year old female with past medical history of insulin-dependent diabetes mellitus type 2, diabetes mellitus stage IIIa, hyperlipidemia, hypertension, gout, moderate persistent asthma, gastroesophageal reflux disease who presents to [facility] emergency department after developing a several day history of confusion, suffering multiple falls. ? ?  ?PT Comments  ? ? Pt seen for PT tx with daughter [name]) present for session. [name] voices concerns about pt d/c home as pt experienced multiple falls prior to admission but was ambulating without AD.  On this date, PT suggested ambulating without AD & with RW to see how balance changes/improves & pt agreeable. Pt voiced need to use bathroom & ambulates in room to bathroom without AD with min assist but pt frequently reaching for objects/furniture for support despite cuing not to with pt reporting "I need to". PT educated pt on need to use RW to improve balance instead. Pt uses bathroom & performs peri hygiene without assistance but once finished reports she is feeling nauseous & appears unwell. Pt assisted to bed & BP checked, WNL. PT educated pt on notes reporting pt ambulated a lap around the nurses station with RW & supervision yesterday & educated pt & daughter on pt's need to use RW upon d/c home to increase balance & reduce fall risk & pt & daughter both voice understanding & both appear more comfortable about pt discharging home.  ? ?BP in LUE in bed: 146/78 mmHg MAP 97 ?HR 86 bpm ?  ?Recommendations for follow up therapy are one component of a multi-disciplinary discharge planning process, led by the attending physician.  Recommendations may be updated based on patient status, additional functional criteria and insurance authorization. ? ?Follow Up Recommendations ? Home health PT ?  ?  ?Assistance Recommended at  Discharge Intermittent Supervision/Assistance  ?Patient can return home with the following Assistance with cooking/housework;Assist for transportation;Help with stairs or ramp for entrance;Direct supervision/assist for medications management;Direct supervision/assist for financial management;A little help with walking and/or transfers ?  ?Equipment Recommendations ? Rolling walker (2 wheels) (daughter reporting they have a RW that they purchased off of [place] but are asking for another)  ?  ?Recommendations for Other Services   ? ? ?  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  ? ?Mobility ? Bed Mobility ?Overal bed mobility: Modified Independent ?Bed Mobility: Sit to Supine ?  ?  ?  ?Sit to supine: Modified independent (Device/Increase time), HOB elevated ?  ?  ?  ? ?Transfers ?Overall transfer level: Needs assistance ?Equipment used: None ?Transfers: Sit to/from Stand ?Sit to Stand: Min guard ?  ?  ?  ?  ?  ?  ?  ? ?Ambulation/Gait ?Ambulation/Gait assistance: Min assist ?Gait Distance (Feet): 15 Feet (+ 5 ft) ?Assistive device: None ?Gait Pattern/deviations: Decreased step length - right, Decreased step length - left, Decreased stride length ?Gait velocity: decreased ?  ?  ?General Gait Details: Pt ambulates around bed to bathroom without AD with min assist with pt frequently reaching for objects for UE support despite cuing not to. ? ? ?Stairs ?  ?  ?  ?  ?  ? ? ?Wheelchair Mobility ?  ? ?Modified Rankin (Stroke Patients Only) ?  ? ? ?  ?Balance Overall balance assessment: Needs assistance ?Sitting-balance support: Feet supported, No upper extremity supported ?Sitting balance-Leahy Scale: Good ?  ?  ?  Standing balance support: Single extremity supported, During functional activity ?Standing balance-Leahy Scale: Poor ?  ?  ?  ?  ?  ?  ?  ?  ?  ?  ?  ?  ?  ? ?  ?Cognition Arousal/Alertness: Awake/alert ?Behavior  During Therapy: [facility] for tasks assessed/performed ?Overall Cognitive Status: Within Functional Limits for tasks assessed ?  ?  ?  ?  ?  ?  ?  ?  ?  ?  ?  ?  ?  ?  ?  ?  ?  ?  ?  ? ?  ?Exercises   ? ?  ?General Comments General comments (skin integrity, edema, etc.): Pt with continent void on toilet. ?  ?  ? ?Pertinent Vitals/Pain Pain Assessment ?Pain Assessment: No/denies pain  ? ? ?Home Living   ?  ?  ?  ?  ?  ?  ?  ?  ?  ?   ?  ?Prior Function    ?  ?  ?   ? ?PT Goals (current goals can now be found in the care plan section) Acute Rehab PT Goals ?Patient Stated Goal: to go home and improve her mobility ?PT Goal Formulation: With patient/family ?Time For Goal Achievement: 06/01/21 ?Potential to Achieve Goals: Good ?Progress towards PT goals: Progressing toward goals ? ?  ?Frequency ? ? ? Min 2X/week ? ? ? ?  ?PT Plan Equipment recommendations need to be updated  ? ? ?Co-evaluation   ?  ?  ?  ?  ? ?  ?AM-PAC PT "6 Clicks" Mobility   ?Outcome Measure ? Help needed turning from your back to your side while in a flat bed without using bedrails?: None ?Help needed moving from lying on your back to sitting on the side of a flat bed without using bedrails?: None ?Help needed moving to and from a bed to a chair (including a wheelchair)?: A Little ?Help needed standing up from a chair using your arms (e.g., wheelchair or bedside chair)?: A Little ?Help needed to walk in hospital room?: A Little ?Help needed climbing 3-5 steps with a railing? : A Little ?6 Click Score: 20 ? ?  ?End of Session Equipment Utilized During Treatment: Gait belt ?Activity Tolerance:  (limited by nausea) ?Patient left: in bed;with call bell/phone within reach;with bed alarm set;with family/visitor present ?Nurse Communication: Mobility status (c/o nausea) ?PT Visit Diagnosis: Other abnormalities of gait and mobility (R26.89);Muscle weakness (generalized) (M62.81) ?  ? ? ?Time: 1012-1026 ?PT Time Calculation (min) (ACUTE ONLY): 14 min ? ?Charges:   $Therapeutic Activity: 8-22 mins          ?          ? ?Lavone Nian, PT, DPT ?05/19/21, 2:29 PM ? ? ? ?Waunita Schooner ?05/19/2021, 2:26 PM ? ?

## 2021-05-20 LAB — GLUCOSE, CAPILLARY
Glucose-Capillary: 140 mg/dL — ABNORMAL HIGH (ref 70–99)
Glucose-Capillary: 164 mg/dL — ABNORMAL HIGH (ref 70–99)
Glucose-Capillary: 168 mg/dL — ABNORMAL HIGH (ref 70–99)

## 2021-05-20 MED ORDER — METOCLOPRAMIDE HCL 10 MG PO TABS: 10.0000 mg | ORAL_TABLET | Freq: Three times a day (TID) | ORAL | 0 refills | Status: DC | PRN

## 2021-05-20 MED ORDER — LORAZEPAM 1 MG PO TABS: 1.0000 mg | ORAL_TABLET | Freq: Two times a day (BID) | ORAL | 0 refills | Status: AC | PRN

## 2021-05-20 MED ORDER — HYDROCODONE-ACETAMINOPHEN 5-325 MG PO TABS: 1.0000 | ORAL_TABLET | Freq: Every day | ORAL | 0 refills | Status: DC | PRN

## 2021-05-20 MED ORDER — PREGABALIN 75 MG PO CAPS: 75.0000 mg | ORAL_CAPSULE | Freq: Two times a day (BID) | ORAL | Status: DC

## 2021-05-20 NOTE — Progress Notes (Signed)
Physician Discharge Summary  ?Madeline Martinez EUM:[id number] DOB: 07-20-1945 DOA: 05/17/2021 ? ?PCP: [name], MD ? ?Admit date: 05/17/2021 ?Discharge date: 05/20/2021 ? ?Admitted From: Home ?Disposition: Home with home health ? ?Recommendations for Outpatient Follow-up:  ?Follow up with PCP in 1-2 weeks ? ? ?Home Health: Yes PT OT RN ?Equipment/Devices: None ? ?Discharge Condition: Stable ?CODE STATUS: Full ?Diet recommendation: Regular ? ?Brief/Interim Summary: ? ?76 year old female with past medical history of insulin-dependent diabetes mellitus type 2, diabetes mellitus stage IIIa, hyperlipidemia, hypertension, gout, moderate persistent asthma, gastroesophageal reflux disease who presents to [facility] emergency department after developing a several day history of confusion, suffering multiple ground-level falls.  ?  ?Patient is a poor historian and is unable to provide significant history due to confusion. ?  ?Of note, patient was recently hospitalized at [facility] from 3/7 until 3/9 for acute metabolic encephalopathy thought to be secondary to Klebsiella urinary tract infection.  Patient was treated initially with intravenous antibiotics and intravenous fluids but was later switched to oral Keflex prior to discharge. ?  ?Patient now presents back to the emergency department with family due to a several day history of progressively worsening lethargy and confusion.  According to family, patient is seemingly speaking to family members that are not present which was quite disconcerting to them.  Patient has additionally been exhibiting increasingly worsening oral intake over the span of time.  Family denies any evidence of vomiting, diarrhea, complaints of pain or fever as of late. ?  ?In the past 24 hours the patient has begun to fall and the family counts approximately 6 separate times that the patient has experienced a fall.  There is no associated loss of consciousness.  Due to progressively worsening lethargy and  confusion with frequent falls patient was eventually brought into [facility] emergency department for evaluation. ?  ?Upon evaluation in the emergency department noncontrast CT imaging of the head was unremarkable.  Chest x-ray revealed no evidence of pneumonia and COVID-19 PCR testing was negative.  Clinically, the patient was felt to be volume depleted and therefore was initiated on intravenous fluids.  Patient was additionally found to be suffering from acute kidney injury with creatinine of 1.76, up from 0.97.  Due to frequent falls and acute kidney injury the hospitalist group has been called to assess the patient for admission to the hospital. ? ?Symptoms gradually improved.  No clear etiology for patient's symptoms could be identified.  Strongly feel this is due to polypharmacy.  Patient was on 4 times daily Ativan 1 mg, twice daily as needed Vicodin, 3 times daily Lyrica.  Lengthy discussion with patient and her daughter at bedside on time of discharge.  Strongly recommend patient minimize doses of these medications as a cumulative effects appears to cause her symptoms.  Patient and daughter expressed understanding and will follow up with PCP. ? ?At time of discharge patient's medication reconciliation was adjusted to reflect recommended reduced dosages of narcotics, benzodiazepines, Lyrica ? ? ?Discharge Diagnoses:  ?Principal Problem: ?  Fall at home, initial encounter ?Active Problems: ?  Acute metabolic encephalopathy ?  Acute renal failure superimposed on stage 3a chronic kidney disease ([place]) ?  Essential hypertension ?  Hypoglycemia ?  Type 2 diabetes mellitus with stage 3a chronic kidney disease, with long-term current use of insulin ([place]) ?  Mixed diabetic hyperlipidemia associated with type 2 diabetes mellitus ([place]) ?  Hypothyroidism ? ?Fall at home, initial encounter ?Approximately 6 falls in the past 24 hours according to family ?Likely secondary  to unsteady gait due to weakness and volume  depletion ?MRI brain unrevealing ?Suspect polypharmacy ?Plan: ?Discharge home.  Home health services ordered.  Reduced dose of narcotics, benzodiazepines, Lyrica ?  ?Acute metabolic encephalopathy ?Patient presenting with several day history of recurrent confusion lethargy, hallucinations and poor oral intake ?Etiology is not yet clear although volume depletion is certainly a contributing factor ?TSH normal ?Ammonia normal ?CT and MRI negative ?B12, inflammatory markers, RPR unrevealing ?Plan: ?Mental status baseline at time of discharge.  Reduced dose of medications as above ?  ?Acute renal failure superimposed on stage 3a chronic kidney disease ([place]) ?Felt to be prerenal secondary to volume depletion ?Urinalysis not indicative of infection ?Kidney function returned to normal ?Plan: ?Kidney function baseline at time of discharge.  Outpatient PCP follow-up ?  ?Hypoglycemia ?Patient exhibiting recurrent bouts of hypoglycemia since arrival ?Likely secondary to slowly metabolized basal insulin in the setting of acute kidney injury ?Plan: ?Resume home diabetic regimen on discharge ?  ?Essential hypertension ?Resume home antihypertensive regimen on discharge ?  ?Type 2 diabetes mellitus with stage 3a chronic kidney disease, with long-term current use of insulin ([place]) ?Resume diabetic regimen on discharge ?  ?  ?  ?Mixed diabetic hyperlipidemia associated with type 2 diabetes mellitus ([place]) ?Continuing home regimen of lipid lowering therapy. ?  ?  ?Hypothyroidism ?Resume home regimen of Synthroid ? ?Discharge Instructions ? ?Discharge Instructions   ? ? Diet - low sodium heart healthy   Complete by: As directed ?  ? Increase activity slowly   Complete by: As directed ?  ? ?  ? ?Allergies as of 05/20/2021   ?No Known Allergies ?  ? ?  ?Medication List  ?  ? ?TAKE these medications   ? ?Advair HFA 115-21 MCG/ACT inhaler ?Generic drug: fluticasone-salmeterol ?Inhale 2 puffs into the lungs 2 (two) times daily. ?  ?albuterol  108 (90 Base) MCG/ACT inhaler ?Commonly known as: VENTOLIN HFA ?Inhale 2 puffs into the lungs every 4 (four) hours as needed for wheezing or shortness of breath. ?  ?allopurinol 100 MG tablet ?Commonly known as: ZYLOPRIM ?Take 100 mg by mouth daily with lunch. ?  ?ascorbic acid 1000 MG tablet ?Commonly known as: VITAMIN C ?Take 1,000 mg by mouth 3 (three) times daily. ?  ?aspirin 81 MG EC tablet ?Take 81 mg by mouth daily. ?  ?atorvastatin 80 MG tablet ?Commonly known as: LIPITOR ?Take 80 mg by mouth daily. ?  ?atropine 1 % ophthalmic solution ?Place 1 drop into the right eye 2 (two) times daily. ?  ?Biotin 5000 MCG Tabs ?Take 5,000 mcg by mouth daily. HAIR/SKIN/NAILS ?  ?carvedilol 12.5 MG tablet ?Commonly known as: COREG ?Take 12.5 mg by mouth 2 (two) times daily. ?  ?cetirizine 10 MG tablet ?Commonly known as: ZYRTEC ?Take 10 mg by mouth daily. ?  ?Dupixent 300 MG/2ML Sopn ?Generic drug: Dupilumab ?Inject 300 mg into the skin every 14 (fourteen) days. ?  ?ferrous sulfate 325 (65 FE) MG tablet ?Take 325 mg by mouth daily. ?  ?FLUoxetine 40 MG capsule ?Commonly known as: PROZAC ?Take 40 mg by mouth daily. ?  ?Gemtesa 75 MG Tabs ?Generic drug: Vibegron ?Take 75 mg by mouth daily. ?  ?glipiZIDE 5 MG 24 hr tablet ?Commonly known as: GLUCOTROL XL ?Take 1 tablet (5 mg total) by mouth daily. ?  ?HYDROcodone-acetaminophen 5-325 MG tablet ?Commonly known as: NORCO/VICODIN ?Take 1 tablet by mouth daily as needed (pain.). ?  ?levothyroxine 25 MCG tablet ?Commonly known as: SYNTHROID ?Take  25 mcg by mouth daily. ?  ?Linzess 290 MCG Caps capsule ?Generic drug: linaclotide ?TAKE 1 CAPSULE BY MOUTH DAILY BEFORE BREAKFAST. ?  ?LORazepam 1 MG tablet ?Commonly known as: ATIVAN ?Take 1 tablet (1 mg total) by mouth 2 (two) times daily as needed for anxiety. ?What changed:  ?when to take this ?reasons to take this ?  ?losartan 100 MG tablet ?Commonly known as: COZAAR ?Take 100 mg by mouth daily. ?  ?metFORMIN 1000 MG tablet ?Commonly  known as: GLUCOPHAGE ?Take 1,000 mg by mouth 2 (two) times daily. ?  ?metoCLOPramide 10 MG tablet ?Commonly known as: REGLAN ?Take 1 tablet (10 mg total) by mouth every 8 (eight) hours as needed for up to 14 days for na

## 2021-05-20 NOTE — Discharge Summary (Signed)
Physician Discharge Summary  ?Madeline Martinez IRC:[id number] DOB: 07-31-1945 DOA: 05/17/2021 ? ?PCP: [name], MD ? ?Admit date: 05/17/2021 ?Discharge date: 05/20/2021 ? ?Admitted From: Home ?Disposition: Home with home health ? ?Recommendations for Outpatient Follow-up:  ?Follow up with PCP in 1-2 weeks ? ? ?Home Health: Yes PT OT RN ?Equipment/Devices: None ? ?Discharge Condition: Stable ?CODE STATUS: Full ?Diet recommendation: Regular ? ?Brief/Interim Summary: ? ?76 year old female with past medical history of insulin-dependent diabetes mellitus type 2, diabetes mellitus stage IIIa, hyperlipidemia, hypertension, gout, moderate persistent asthma, gastroesophageal reflux disease who presents to [facility] emergency department after developing a several day history of confusion, suffering multiple ground-level falls.  ?  ?Patient is a poor historian and is unable to provide significant history due to confusion. ?  ?Of note, patient was recently hospitalized at [facility] from 3/7 until 3/9 for acute metabolic encephalopathy thought to be secondary to Klebsiella urinary tract infection.  Patient was treated initially with intravenous antibiotics and intravenous fluids but was later switched to oral Keflex prior to discharge. ?  ?Patient now presents back to the emergency department with family due to a several day history of progressively worsening lethargy and confusion.  According to family, patient is seemingly speaking to family members that are not present which was quite disconcerting to them.  Patient has additionally been exhibiting increasingly worsening oral intake over the span of time.  Family denies any evidence of vomiting, diarrhea, complaints of pain or fever as of late. ?  ?In the past 24 hours the patient has begun to fall and the family counts approximately 6 separate times that the patient has experienced a fall.  There is no associated loss of consciousness.  Due to progressively worsening lethargy and  confusion with frequent falls patient was eventually brought into [facility] emergency department for evaluation. ?  ?Upon evaluation in the emergency department noncontrast CT imaging of the head was unremarkable.  Chest x-ray revealed no evidence of pneumonia and COVID-19 PCR testing was negative.  Clinically, the patient was felt to be volume depleted and therefore was initiated on intravenous fluids.  Patient was additionally found to be suffering from acute kidney injury with creatinine of 1.76, up from 0.97.  Due to frequent falls and acute kidney injury the hospitalist group has been called to assess the patient for admission to the hospital. ? ?Symptoms gradually improved.  No clear etiology for patient's symptoms could be identified.  Strongly feel this is due to polypharmacy.  Patient was on 4 times daily Ativan 1 mg, twice daily as needed Vicodin, 3 times daily Lyrica.  Lengthy discussion with patient and her daughter at bedside on time of discharge.  Strongly recommend patient minimize doses of these medications as a cumulative effects appears to cause her symptoms.  Patient and daughter expressed understanding and will follow up with PCP. ? ?At time of discharge patient's medication reconciliation was adjusted to reflect recommended reduced dosages of narcotics, benzodiazepines, Lyrica ? ? ?Discharge Diagnoses:  ?Principal Problem: ?  Fall at home, initial encounter ?Active Problems: ?  Acute metabolic encephalopathy ?  Acute renal failure superimposed on stage 3a chronic kidney disease ([place]) ?  Essential hypertension ?  Hypoglycemia ?  Type 2 diabetes mellitus with stage 3a chronic kidney disease, with long-term current use of insulin ([facility]) ?  Mixed diabetic hyperlipidemia associated with type 2 diabetes mellitus ([facility]) ?  Hypothyroidism ? ?Fall at home, initial encounter ?Approximately 6 falls in the past 24 hours according to family ?Likely secondary  to unsteady gait due to weakness and volume  depletion ?MRI brain unrevealing ?Suspect polypharmacy ?Plan: ?Discharge home.  Home health services ordered.  Reduced dose of narcotics, benzodiazepines, Lyrica ?  ?Acute metabolic encephalopathy ?Patient presenting with several day history of recurrent confusion lethargy, hallucinations and poor oral intake ?Etiology is not yet clear although volume depletion is certainly a contributing factor ?TSH normal ?Ammonia normal ?CT and MRI negative ?B12, inflammatory markers, RPR unrevealing ?Plan: ?Mental status baseline at time of discharge.  Reduced dose of medications as above ?  ?Acute renal failure superimposed on stage 3a chronic kidney disease ([facility]) ?Felt to be prerenal secondary to volume depletion ?Urinalysis not indicative of infection ?Kidney function returned to normal ?Plan: ?Kidney function baseline at time of discharge.  Outpatient PCP follow-up ?  ?Hypoglycemia ?Patient exhibiting recurrent bouts of hypoglycemia since arrival ?Likely secondary to slowly metabolized basal insulin in the setting of acute kidney injury ?Plan: ?Resume home diabetic regimen on discharge ?  ?Essential hypertension ?Resume home antihypertensive regimen on discharge ?  ?Type 2 diabetes mellitus with stage 3a chronic kidney disease, with long-term current use of insulin ([facility]) ?Resume diabetic regimen on discharge ?  ?  ?  ?Mixed diabetic hyperlipidemia associated with type 2 diabetes mellitus ([facility]) ?Continuing home regimen of lipid lowering therapy. ?  ?  ?Hypothyroidism ?Resume home regimen of Synthroid ? ?Discharge Instructions ? ?Discharge Instructions   ? ? Diet - low sodium heart healthy   Complete by: As directed ?  ? Increase activity slowly   Complete by: As directed ?  ? ?  ? ?Allergies as of 05/20/2021   ?No Known Allergies ?  ? ?  ?Medication List  ?  ? ?TAKE these medications   ? ?Advair HFA 115-21 MCG/ACT inhaler ?Generic drug: fluticasone-salmeterol ?Inhale 2 puffs into the lungs 2 (two) times daily. ?  ?albuterol  108 (90 Base) MCG/ACT inhaler ?Commonly known as: VENTOLIN HFA ?Inhale 2 puffs into the lungs every 4 (four) hours as needed for wheezing or shortness of breath. ?  ?allopurinol 100 MG tablet ?Commonly known as: ZYLOPRIM ?Take 100 mg by mouth daily with lunch. ?  ?ascorbic acid 1000 MG tablet ?Commonly known as: VITAMIN C ?Take 1,000 mg by mouth 3 (three) times daily. ?  ?aspirin 81 MG EC tablet ?Take 81 mg by mouth daily. ?  ?atorvastatin 80 MG tablet ?Commonly known as: LIPITOR ?Take 80 mg by mouth daily. ?  ?atropine 1 % ophthalmic solution ?Place 1 drop into the right eye 2 (two) times daily. ?  ?Biotin 5000 MCG Tabs ?Take 5,000 mcg by mouth daily. HAIR/SKIN/NAILS ?  ?carvedilol 12.5 MG tablet ?Commonly known as: COREG ?Take 12.5 mg by mouth 2 (two) times daily. ?  ?cetirizine 10 MG tablet ?Commonly known as: ZYRTEC ?Take 10 mg by mouth daily. ?  ?Dupixent 300 MG/2ML Sopn ?Generic drug: Dupilumab ?Inject 300 mg into the skin every 14 (fourteen) days. ?  ?ferrous sulfate 325 (65 FE) MG tablet ?Take 325 mg by mouth daily. ?  ?FLUoxetine 40 MG capsule ?Commonly known as: PROZAC ?Take 40 mg by mouth daily. ?  ?Gemtesa 75 MG Tabs ?Generic drug: Vibegron ?Take 75 mg by mouth daily. ?  ?glipiZIDE 5 MG 24 hr tablet ?Commonly known as: GLUCOTROL XL ?Take 1 tablet (5 mg total) by mouth daily. ?  ?HYDROcodone-acetaminophen 5-325 MG tablet ?Commonly known as: NORCO/VICODIN ?Take 1 tablet by mouth daily as needed (pain.). ?  ?levothyroxine 25 MCG tablet ?Commonly known as: SYNTHROID ?Take  25 mcg by mouth daily. ?  ?Linzess 290 MCG Caps capsule ?Generic drug: linaclotide ?TAKE 1 CAPSULE BY MOUTH DAILY BEFORE BREAKFAST. ?  ?LORazepam 1 MG tablet ?Commonly known as: ATIVAN ?Take 1 tablet (1 mg total) by mouth 2 (two) times daily as needed for anxiety. ?What changed:  ?when to take this ?reasons to take this ?  ?losartan 100 MG tablet ?Commonly known as: COZAAR ?Take 100 mg by mouth daily. ?  ?metFORMIN 1000 MG tablet ?Commonly  known as: GLUCOPHAGE ?Take 1,000 mg by mouth 2 (two) times daily. ?  ?metoCLOPramide 10 MG tablet ?Commonly known as: REGLAN ?Take 1 tablet (10 mg total) by mouth every 8 (eight) hours as needed for up to 14 days for na

## 2021-05-20 NOTE — TOC Transition Note (Signed)
Transition of Care (TOC) - CM/SW Discharge Note ? ? ?Patient Details  ?Name: Madeline Martinez ?MRN: [id number] ?Date of Birth: 06-27-1945 ? ?Transition of Care (TOC) CM/SW Contact:  ?Lowanda Cashaw, [place], [place] ?Phone Number: ?05/20/2021, 10:26 AM ? ? ?Clinical Narrative:    ?Return call from [facility], spoke with Clarise Cruz who confirmed discharge, [facility] services to resume tomorrow 05/21/21. ? ?[employer], LCSW ?Transition of Care ?[phone] ? ? ? ?  ?  ? ? ?Patient Goals and CMS Choice ?  ?  ?  ? ?Discharge Placement ?  ?           ?  ?  ?  ?  ? ?Discharge Plan and Services ?  ?  ?           ?  ?  ?  ?  ?  ?  ?  ?  ?  ?  ? ?Social Determinants of Health (SDOH) Interventions ?  ? ? ?Readmission Risk Interventions ? ?  05/18/2021  ?  3:56 PM  ?Readmission Risk Prevention Plan  ?Transportation Screening Complete  ?PCP or Specialist Appt within 5-7 Days Complete  ?Home Care Screening Complete  ?Medication Review (RN CM) Complete  ? ? ? ? ? ?

## 2021-05-20 NOTE — TOC Progression Note (Addendum)
Transition of Care (TOC) - Progression Note  ? ? ?Patient Details  ?Name: Yanisa Goodgame ?MRN: [id number] ?Date of Birth: Feb 22, 1946 ? ?Transition of Care (TOC) CM/SW Contact  ?[place], [place], [place] ?Phone Number: ?05/20/2021, 9:58 AM ? ?Clinical Narrative:    ?Patient to discharge home today with family. Family to transport patient home. Patient has no DME needs and was previously set up with [facility] for [facility] services. [facility] contacted and informed of patient's discharge home today. ? ? ?Lawonda Pretlow, LCSW ?Transition of Care ?[phone] ? ? ? ?  ?  ? ?Expected Discharge Plan and Services ?  ?  ?  ?  ?  ?Expected Discharge Date: 05/20/21               ?  ?  ?  ?  ?  ?  ?  ?  ?  ?  ? ? ?Social Determinants of Health (SDOH) Interventions ?  ? ?Readmission Risk Interventions ? ?  05/18/2021  ?  3:56 PM  ?Readmission Risk Prevention Plan  ?Transportation Screening Complete  ?PCP or Specialist Appt within 5-7 Days Complete  ?Home Care Screening Complete  ?Medication Review (RN CM) Complete  ? ? ?

## 2021-06-06 ENCOUNTER — Other Ambulatory Visit: Payer: Self-pay | Admitting: Urology

## 2021-06-06 ENCOUNTER — Other Ambulatory Visit: Payer: Self-pay | Admitting: Gastroenterology

## 2021-06-12 ENCOUNTER — Other Ambulatory Visit: Payer: Self-pay | Admitting: Gastroenterology

## 2021-06-12 NOTE — Progress Notes (Deleted)
06/13/2021 10:54 AM   [facility] 09/26/1945 [id number]  Referring provider: Casilda Martinez, [place],  [facility]  No chief complaint on file.  Urological history: 1. OAB -contributing factors of age, diabetes, anxiety and depression  -PVR *** mL -managed with Myrbetriq 50 mg daily and oxybutynin XL 10 mg daily   2. Bilateral renal cysts -contrast CT 04/2021 - Multiple bilateral simple renal cysts  HPI: Madeline Martinez is a 76 y.o. female who presents today for three month follow up with her daughter, [facility].  ***   PVR    PMH: Past Medical History:  Diagnosis Date   Acid reflux    Anxiety    Depression    Diabetes mellitus    Dysrhythmia    Gout    Heart murmur    HTN (hypertension)    Hyperlipidemia    Sleep apnea     Surgical History: Past Surgical History:  Procedure Laterality Date   ABDOMINAL HYSTERECTOMY     BACK SURGERY  2007   DISCS   BIOPSY  10/08/2018   Procedure: BIOPSY;  Surgeon: [facility]., MD;  Location: [facility];  Service: Gastroenterology;;   COLONOSCOPY WITH PROPOFOL N/A 12/19/2017   Procedure: COLONOSCOPY WITH PROPOFOL;  Surgeon: Madeline Bellows, MD;  Location: [facility] ENDOSCOPY;  Service: Gastroenterology;  Laterality: N/A;   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, MD;  Location: [facility] ENDOSCOPY;  Service: Gastroenterology;   Laterality: N/A;   EUS N/A 10/08/2018   Procedure: UPPER ENDOSCOPIC ULTRASOUND (EUS) RADIAL;  Surgeon: [facility]., MD;  Location: [facility] ENDOSCOPY;  Service: Gastroenterology;  Laterality: N/A;   PARTIAL HYSTERECTOMY  1986   POLYPECTOMY  10/08/2018   Procedure: POLYPECTOMY;  Surgeon: [facility], [facility]., MD;  Location: [facility];  Service: Gastroenterology;;    Home Medications:  Allergies as of 06/13/2021   No Known Allergies      Medication List        Accurate as of June 12, 2021 10:54 AM. If you have any questions, ask your nurse or doctor.          Advair HFA 115-21 MCG/ACT inhaler Generic drug: fluticasone-salmeterol Inhale 2 puffs into the lungs 2 (two) times daily.   albuterol 108 (90 Base) MCG/ACT inhaler Commonly known as: VENTOLIN HFA Inhale 2 puffs into the lungs every 4 (four) hours as needed for wheezing or shortness of breath.   allopurinol 100 MG tablet Commonly known as: ZYLOPRIM Take 100 mg by mouth daily with lunch.   ascorbic acid 1000 MG tablet Commonly known as: VITAMIN C Take 1,000 mg by mouth 3 (three) times daily.   aspirin 81 MG EC tablet Take 81 mg by mouth daily.   atorvastatin 80 MG tablet Commonly known as: LIPITOR Take 80 mg by mouth daily.   atropine 1 % ophthalmic solution Place 1 drop into the right eye 2 (two) times daily.   Biotin 5000 MCG Tabs Take 5,000 mcg by mouth daily. HAIR/SKIN/NAILS   carvedilol 12.5 MG  tablet Commonly known as: COREG Take 12.5 mg by mouth 2 (two) times daily.   cetirizine 10 MG tablet Commonly known as: ZYRTEC Take 10 mg by mouth daily.   Dupixent 300 MG/2ML Sopn Generic drug: Dupilumab Inject 300 mg into the skin every 14 (fourteen) days.   ferrous sulfate 325 (65 FE) MG tablet Take 325 mg by mouth daily.   FLUoxetine 40 MG capsule Commonly known as: PROZAC Take 40 mg by mouth daily.   Gemtesa 75 MG Tabs Generic drug: Vibegron Take 75 mg by mouth daily.   glipiZIDE  5 MG 24 hr tablet Commonly known as: GLUCOTROL XL Take 1 tablet (5 mg total) by mouth daily.   HYDROcodone-acetaminophen 5-325 MG tablet Commonly known as: NORCO/VICODIN Take 1 tablet by mouth daily as needed (pain.).   levothyroxine 25 MCG tablet Commonly known as: SYNTHROID Take 25 mcg by mouth daily.   Linzess 290 MCG Caps capsule Generic drug: linaclotide TAKE 1 CAPSULE BY MOUTH DAILY BEFORE BREAKFAST.   LORazepam 1 MG tablet Commonly known as: ATIVAN Take 1 tablet (1 mg total) by mouth 2 (two) times daily as needed for anxiety.   losartan 100 MG tablet Commonly known as: COZAAR Take 100 mg by mouth daily.   metFORMIN 1000 MG tablet Commonly known as: GLUCOPHAGE Take 1,000 mg by mouth 2 (two) times daily.   metoCLOPramide 10 MG tablet Commonly known as: REGLAN Take 1 tablet (10 mg total) by mouth every 8 (eight) hours as needed for up to 14 days for nausea.   oxybutynin 10 MG 24 hr tablet Commonly known as: DITROPAN-XL Take 10 mg by mouth daily.   prednisoLONE acetate 1 % ophthalmic suspension Commonly known as: PRED FORTE Place 1 drop into the right eye 2 (two) times daily.   pregabalin 75 MG capsule Commonly known as: LYRICA Take 1 capsule (75 mg total) by mouth 2 (two) times daily.   PROBIOTIC PO Take 1 tablet by mouth daily.   Tyler Aas FlexTouch 100 UNIT/ML FlexTouch Pen Generic drug: insulin degludec Inject 15 Units into the skin daily.   Vitamin D-3 125 MCG (5000 UT) Tabs Take 5,000 Units by mouth daily.   vitamin E 45 MG (100 UNITS) capsule Take by mouth.        Allergies: No Known Allergies  Family History: Family History  Problem Relation Age of Onset   Breast cancer Mother    Heart failure Father    Other Sister        DEGEN.DISC DISEASE   Breast cancer Maternal Grandmother    Kidney cancer Neg Hx    Kidney disease Neg Hx    Prostate cancer Neg Hx     Social History:  reports that she has never smoked. She has never used  smokeless tobacco. She reports that she does not drink alcohol and does not use drugs.  ROS: For pertinent review of systems please refer to history of present illness  Physical Exam: There were no vitals taken for this visit.  Constitutional:  Well nourished. Alert and oriented, No acute distress. HEENT:  AT, moist mucus membranes.  Trachea midline, no masses. Cardiovascular: No clubbing, cyanosis, or edema. Respiratory: Normal respiratory effort, no increased work of breathing. GI: Abdomen is soft, non tender, non distended, no abdominal masses. Liver and spleen not palpable.  No hernias appreciated.  Stool sample for occult testing is not indicated.   GU: No CVA tenderness.  No bladder fullness or masses.  *** external genitalia, *** pubic hair  distribution, no lesions.  Normal urethral meatus, no lesions, no prolapse, no discharge.   No urethral masses, tenderness and/or tenderness. No bladder fullness, tenderness or masses. *** vagina mucosa, *** estrogen effect, no discharge, no lesions, *** pelvic support, *** cystocele and *** rectocele noted.  No cervical motion tenderness.  Uterus is freely mobile and non-fixed.  No adnexal/parametria masses or tenderness noted.  Anus and perineum are without rashes or lesions.   ***  Skin: No rashes, bruises or suspicious lesions. Lymph: No cervical or inguinal adenopathy. Neurologic: Grossly intact, no focal deficits, moving all 4 extremities. Psychiatric: Normal mood and affect.    Laboratory Data:    Latest Ref Rng & Units 05/19/2021    8:28 AM 05/18/2021    5:12 AM 05/17/2021    7:47 PM  CMP  Glucose 70 - 99 mg/dL 172   95   102    BUN 8 - 23 mg/dL 15   36   41    Creatinine 0.44 - 1.00 mg/dL 0.88   1.41   1.76    Sodium 135 - 145 mmol/L 139   139   136    Potassium 3.5 - 5.1 mmol/L 4.0   4.5   4.5    Chloride 98 - 111 mmol/L 106   104   101    CO2 22 - 32 mmol/L '23   28   26    '$ Calcium 8.9 - 10.3 mg/dL 9.2   9.1   8.9    Total Protein  6.5 - 8.1 g/dL  6.9     Total Bilirubin 0.3 - 1.2 mg/dL  0.5     Alkaline Phos 38 - 126 U/L  51     AST 15 - 41 U/L  21     ALT 0 - 44 U/L  22          Latest Ref Rng & Units 05/19/2021    8:28 AM 05/18/2021    5:12 AM 05/17/2021    7:47 PM  CBC  WBC 4.0 - 10.5 K/uL 5.2   7.4   7.8    Hemoglobin 12.0 - 15.0 g/dL 11.5   10.8   11.3    Hematocrit 36.0 - 46.0 % 36.2   35.1   37.0    Platelets 150 - 400 K/uL 363   336   353    I have reviewed the labs.    Pertinent Imaging: ***    Assessment & Plan:    1. Nighttime Incontinence -***  2. Nocturia -May need a repeat sleep study in the future  3. Urgency See above   No follow-ups on file.  These notes generated with voice recognition software. I apologize for typographical errors.  Zara Council, PA-C  [facility] [address] [place], [place] [address] [phone]

## 2021-06-13 ENCOUNTER — Ambulatory Visit: Payer: BLUE CROSS/BLUE SHIELD | Admitting: Urology

## 2021-06-13 DIAGNOSIS — N3944 Nocturnal enuresis: Secondary | ICD-10-CM

## 2021-06-13 DIAGNOSIS — R3915 Urgency of urination: Secondary | ICD-10-CM

## 2021-06-13 DIAGNOSIS — R351 Nocturia: Secondary | ICD-10-CM

## 2021-06-16 ENCOUNTER — Encounter: Payer: Self-pay | Admitting: Urology

## 2021-06-16 ENCOUNTER — Ambulatory Visit: Payer: Medicare HMO | Admitting: Urology

## 2021-06-16 VITALS — BP 155/73 | HR 106 | Ht 60.0 in | Wt 192.0 lb

## 2021-06-16 DIAGNOSIS — N3281 Overactive bladder: Secondary | ICD-10-CM

## 2021-06-16 DIAGNOSIS — N3944 Nocturnal enuresis: Secondary | ICD-10-CM

## 2021-06-16 DIAGNOSIS — R3915 Urgency of urination: Secondary | ICD-10-CM

## 2021-06-16 LAB — BLADDER SCAN AMB NON-IMAGING

## 2021-06-16 MED ORDER — MIRABEGRON ER 50 MG PO TB24: 50.0000 mg | ORAL_TABLET | Freq: Every day | ORAL | 0 refills | Status: DC

## 2021-06-16 NOTE — Progress Notes (Signed)
06/21/21 ?12:21 PM  ? ?Madeline Martinez ?Mar 23, 1945 ?[id number] ? ?Referring provider:  ?[name], MD ?[address] ?[place],  [place] [address] ? ?Chief Complaint  ?Patient presents with  ? Over Active Bladder  ? ? ?Urological history  ?1. OAB wet ?-contributing factors of age, diabetes, anxiety, benzodiazepines, diuretics, hypertension, vaginal atrophy, pelvic surgery and depression  ?-PVR 2 mL ?-Completed PTNS in 2020 ?-managed with Myrbetriq 50 mg daily and oxybutynin XL 10 mg daily  ? ?2. Nocturia ?- Risk factors for nocturia: obstructive sleep apnea, hypertension, diabetes, arthritis, anxiety and depression ?  ?3. Bilateral renal cysts ?-Contrast CT March 2023-There are multiple bilateral simple renal cysts. The largest is seen within the posterior aspect of the mid right kidney and measures 6.2 cm in diameter. ? ?HPI: ?Madeline Martinez is a 76 y.o.female who presents today for a 3 month OAB questionnaire and PVR.  ? ?She has 1-7 daytime urination's and nocturia 1-2x. She has a strong urge to urinate. She always wears pads for urinary leakage. She wears 2 depends daily does limit fluid intake and engages in toilet mapping on occasion ? ?She is still using oxybutynin and Myrbetriq 50 mg.  She did not find the Gemtesa effective.  She does not leak urine, but she wears depends in case.  ? ?PMH: ?Past Medical History:  ?Diagnosis Date  ? Acid reflux   ? Anxiety   ? Depression   ? Diabetes mellitus   ? Dysrhythmia   ? Gout   ? Heart murmur   ? HTN (hypertension)   ? Hyperlipidemia   ? Sleep apnea   ? ? ?Surgical History: ?Past Surgical History:  ?Procedure Laterality Date  ? ABDOMINAL HYSTERECTOMY    ? BACK SURGERY  2007  ? DISCS  ? BIOPSY  10/08/2018  ? Procedure: BIOPSY;  Surgeon: Irving Copas., MD;  Location: [facility];  Service: Gastroenterology;;  ? COLONOSCOPY WITH PROPOFOL N/A 12/19/2017  ? Procedure: COLONOSCOPY WITH PROPOFOL;  Surgeon: Jonathon Bellows, MD;  Location: [facility] ENDOSCOPY;  Service:  Gastroenterology;  Laterality: N/A;  ? 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 76  ? BILATERAL  ? ENDOSCOPIC MUCOSAL RESECTION N/A 10/08/2018  ? Procedure: ENDOSCOPIC MUCOSAL RESECTION;  Surgeon: Rush Landmark Telford Nab., MD;  Location: [facility] ENDOSCOPY;  Service: Gastroenterology;  Laterality: N/A;  ? ESOPHAGOGASTRODUODENOSCOPY (EGD) WITH PROPOFOL N/A 08/28/2018  ? Procedure: ESOPHAGOGASTRODUODENOSCOPY (EGD) WITH PROPOFOL;  Surgeon: Jonathon Bellows, MD;  Location: [facility] ENDOSCOPY;  Service: Gastroenterology;  Laterality: N/A;  ? ESOPHAGOGASTRODUODENOSCOPY (EGD) WITH PROPOFOL N/A 10/08/2018  ? Procedure: ESOPHAGOGASTRODUODENOSCOPY (EGD) WITH PROPOFOL;  Surgeon: Rush Landmark Telford Nab., MD;  Location: [facility] ENDOSCOPY;  Service: Gastroenterology;  Laterality: N/A;  ? EUS N/A 10/08/2018  ? Procedure: UPPER ENDOSCOPIC ULTRASOUND (EUS) RADIAL;  Surgeon: Rush Landmark Telford Nab., MD;  Location: [facility] ENDOSCOPY;  Service: Gastroenterology;  Laterality: N/A;  ? PARTIAL HYSTERECTOMY  1986  ? POLYPECTOMY  10/08/2018  ? Procedure: POLYPECTOMY;  Surgeon: Mansouraty, Telford Nab., MD;  Location: [facility] ENDOSCOPY;  Service: Gastroenterology;;  ? ? ?Home Medications:  ?Allergies as of 06/16/2021   ?No Known Allergies ?  ? ?  ?Medication List  ?  ? ?  ? Accurate as of June 16, 2021 11:59 PM. If you have any questions, ask your nurse or doctor.  ?  ?  ? ?  ? ?STOP taking these medications   ? ?Gemtesa 75 MG Tabs ?Generic drug: Vibegron ?Stopped by: Zara Council, PA-C ?  ?oxybutynin 10 MG 24 hr tablet ?Commonly known as: DITROPAN-XL ?Stopped by:  Dorr Perrot, PA-C ?  ? ?  ? ?TAKE these medications   ? ?Advair HFA 115-21 MCG/ACT inhaler ?Generic drug: fluticasone-salmeterol ?Inhale 2 puffs into the lungs 2 (two) times daily. ?  ?albuterol 108 (90 Base) MCG/ACT inhaler ?Commonly known as: VENTOLIN HFA ?Inhale 2 puffs into the lungs every 4 (four) hours as needed for wheezing or shortness of breath. ?  ?allopurinol 100 MG tablet ?Commonly known as: ZYLOPRIM ?Take 100  mg by mouth daily with lunch. ?  ?ascorbic acid 1000 MG tablet ?Commonly known as: VITAMIN C ?Take 1,000 mg by mouth 3 (three) times daily. ?  ?aspirin 81 MG EC tablet ?Take 81 mg by mouth daily. ?  ?atorvastatin 80 MG tablet ?Commonly known as: LIPITOR ?Take 80 mg by mouth daily. ?  ?atropine 1 % ophthalmic solution ?Place 1 drop into the right eye 2 (two) times daily. ?  ?Biotin 5000 MCG Tabs ?Take 5,000 mcg by mouth daily. HAIR/SKIN/NAILS ?  ?carvedilol 12.5 MG tablet ?Commonly known as: COREG ?Take 12.5 mg by mouth 2 (two) times daily. ?  ?cetirizine 10 MG tablet ?Commonly known as: ZYRTEC ?Take 10 mg by mouth daily. ?  ?Dupixent 300 MG/2ML Sopn ?Generic drug: Dupilumab ?Inject 300 mg into the skin every 14 (fourteen) days. ?  ?ferrous sulfate 325 (65 FE) MG tablet ?Take 325 mg by mouth daily. ?  ?FLUoxetine 40 MG capsule ?Commonly known as: PROZAC ?Take 40 mg by mouth daily. ?  ?glipiZIDE 5 MG 24 hr tablet ?Commonly known as: GLUCOTROL XL ?Take 1 tablet (5 mg total) by mouth daily. ?  ?HYDROcodone-acetaminophen 5-325 MG tablet ?Commonly known as: NORCO/VICODIN ?Take 1 tablet by mouth daily as needed (pain.). ?  ?levothyroxine 25 MCG tablet ?Commonly known as: SYNTHROID ?Take 25 mcg by mouth daily. ?  ?Linzess 290 MCG Caps capsule ?Generic drug: linaclotide ?TAKE 1 CAPSULE BY MOUTH DAILY BEFORE BREAKFAST. ?  ?LORazepam 1 MG tablet ?Commonly known as: ATIVAN ?Take 1 tablet (1 mg total) by mouth 2 (two) times daily as needed for anxiety. ?  ?losartan 100 MG tablet ?Commonly known as: COZAAR ?Take 100 mg by mouth daily. ?  ?metFORMIN 1000 MG tablet ?Commonly known as: GLUCOPHAGE ?Take 1,000 mg by mouth 2 (two) times daily. ?  ?metoCLOPramide 10 MG tablet ?Commonly known as: REGLAN ?Take 1 tablet (10 mg total) by mouth every 8 (eight) hours as needed for up to 14 days for nausea. ?  ?mirabegron ER 50 MG Tb24 tablet ?Commonly known as: MYRBETRIQ ?Take 1 tablet (50 mg total) by mouth daily. ?Started by: Zara Council, PA-C ?  ?omeprazole 20 MG capsule ?Commonly known as: PRILOSEC ?Take 20 mg by mouth daily as needed. ?  ?prednisoLONE acetate 1 % ophthalmic suspension ?Commonly known as: PRED FORTE ?Place 1 drop into the right eye 2 (two) times daily. ?  ?pregabalin 75 MG capsule ?Commonly known as: LYRICA ?Take 1 capsule (75 mg total) by mouth 2 (two) times daily. ?  ?PROBIOTIC PO ?Take 1 tablet by mouth daily. ?  ?Tyler Aas FlexTouch 100 UNIT/ML FlexTouch Pen ?Generic drug: insulin degludec ?Inject 15 Units into the skin daily. ?  ?Vitamin D-3 125 MCG (5000 UT) Tabs ?Take 5,000 Units by mouth daily. ?  ?vitamin E 45 MG (100 UNITS) capsule ?Take by mouth. ?  ? ?  ? ? ?Allergies:  ?No Known Allergies ? ?Family History: ?Family History  ?Problem Relation Age of Onset  ? Breast cancer Mother   ? Heart failure Father   ?  Other Sister   ?     DEGEN.DISC DISEASE  ? Breast cancer Maternal Grandmother   ? Kidney cancer Neg Hx   ? Kidney disease Neg Hx   ? Prostate cancer Neg Hx   ? ? ?Social History:  reports that she has never smoked. She has never used smokeless tobacco. She reports that she does not drink alcohol and does not use drugs. ? ? ?Physical Exam: ?BP (!) 155/73   Pulse (!) 106   Ht 5' (1.524 m)   Wt 192 lb (87.1 kg)   BMI 37.50 kg/m?   ?Constitutional:  Well nourished. Alert and oriented, No acute distress. ?HEENT: [place] AT, moist mucus membranes.  Trachea midline ?Cardiovascular: No clubbing, cyanosis, or edema. ?Respiratory: Normal respiratory effort, no increased work of breathing. ?Neurologic: Grossly intact, no focal deficits, moving all 4 extremities. ?Psychiatric: Normal mood and affect.  ? ?Laboratory Data: ?Lab Results  ?Component Value Date  ? HGBA1C 6.7 (H) 05/02/2021  ? ? ?  Latest Ref Rng & Units 05/19/2021  ?  8:28 AM 05/18/2021  ?  5:12 AM 05/17/2021  ?  7:47 PM  ?CMP  ?Glucose 70 - 99 mg/dL 172   95   102    ?BUN 8 - 23 mg/dL 15   36   41    ?Creatinine 0.44 - 1.00 mg/dL 0.88   1.41   1.76    ?Sodium 135  - 145 mmol/L 139   139   136    ?Potassium 3.5 - 5.1 mmol/L 4.0   4.5   4.5    ?Chloride 98 - 111 mmol/L 106   104   101    ?CO2 22 - 32 mmol/L '23   28   26    '$ ?Calcium 8.9 - 10.3 mg/dL 9.2   9.1   8.9    ?Total

## 2021-08-14 ENCOUNTER — Encounter: Payer: Self-pay | Admitting: Urology

## 2021-08-14 ENCOUNTER — Ambulatory Visit (INDEPENDENT_AMBULATORY_CARE_PROVIDER_SITE_OTHER): Payer: Medicare HMO | Admitting: Urology

## 2021-08-14 VITALS — BP 121/72 | HR 85 | Ht 60.0 in | Wt 191.0 lb

## 2021-08-14 DIAGNOSIS — N3944 Nocturnal enuresis: Secondary | ICD-10-CM | POA: Diagnosis not present

## 2021-08-14 DIAGNOSIS — R3915 Urgency of urination: Secondary | ICD-10-CM | POA: Diagnosis not present

## 2021-08-14 DIAGNOSIS — N3281 Overactive bladder: Secondary | ICD-10-CM | POA: Diagnosis not present

## 2021-08-14 LAB — BLADDER SCAN AMB NON-IMAGING: Scan Result: 166

## 2021-08-14 MED ORDER — MIRABEGRON ER 50 MG PO TB24: 50.0000 mg | ORAL_TABLET | Freq: Every day | ORAL | 3 refills | Status: AC

## 2021-08-14 NOTE — Progress Notes (Signed)
08/14/21 11:59 AM   Madeline Martinez 1945-05-02 [id number]  Referring provider:  [name], [address] [place],  [place] [address]  Chief Complaint  Patient presents with   Nocturnal Enuresis    Urological history  1. OAB wet -contributing factors of age, diabetes, anxiety, benzodiazepines, diuretics, hypertension, vaginal atrophy, pelvic surgery and depression  -PVR 166 ml -Completed PTNS in 2020 -Myrbetriq 50 mg daily   2. Nocturia - Risk factors for nocturia: obstructive sleep apnea, hypertension, diabetes, arthritis, anxiety and depression   3. Bilateral renal cysts -Contrast CT March 2023-There are multiple bilateral simple renal cysts. The largest is seen within the posterior aspect of the mid right kidney and measures 6.2 cm in diameter.   HPI: Madeline Martinez is a 76 y.o.female who presents today for a 2 month follow-up.  She reports 0-3 episodes of rushing to bathroom. She has the urge to urinate 0- 3x she is not restricting fluids she does engage in toilet mapping she has leaked urine 0-3x this week she has had no nighttime urges.   Patient denies any modifying or aggravating factors.  Patient denies any gross hematuria, dysuria or suprapubic/flank pain.  Patient denies any fevers, chills, nausea or vomiting.    She reports that she is using myrbetriq and has improvement on this medicine.    PMH: Past Medical History:  Diagnosis Date   Acid reflux    Anxiety    Depression    Diabetes mellitus    Dysrhythmia    Gout    Heart murmur    HTN (hypertension)    Hyperlipidemia    Sleep apnea     Surgical History: Past Surgical History:  Procedure Laterality Date   ABDOMINAL HYSTERECTOMY     BACK SURGERY  2007   DISCS   BIOPSY  10/08/2018   Procedure: BIOPSY;  Surgeon: Irving Copas., MD;  Location: [facility] END[facility]COPY;  Service: Gastroenterology;;   COLONOSCOPY WITH PROPOFOL N/A 12/19/2017   Procedure: COLONOSCOPY WITH PROPOFOL;  Surgeon:  Jonathon Bellows, MD;  Location: [facility] ENDOSCOPY;  Service: Gastroenterology;  Laterality: N/A;   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 89   BILATERAL   ENDOSCOPIC MUCOSAL RESECTION N/A 10/08/2018   Procedure: ENDOSCOPIC MUCOSAL RESECTION;  Surgeon: Rush Landmark Telford Nab., MD;  Location: [facility] ENDOSCOPY;  Service: Gastroenterology;  Laterality: N/A;   ESOPHAGOGASTRODUODENOSCOPY (EGD) WITH PROPOFOL N/A 08/28/2018   Procedure: ESOPHAGOGASTRODUODENOSCOPY (EGD) WITH PROPOFOL;  Surgeon: Jonathon Bellows, MD;  Location: [facility] ENDOSCOPY;  Service: Gastroenterology;  Laterality: N/A;   ESOPHAGOGASTRODUODENOSCOPY (EGD) WITH PROPOFOL N/A 10/08/2018   Procedure: ESOPHAGOGASTRODUODENOSCOPY (EGD) WITH PROPOFOL;  Surgeon: Rush Landmark Telford Nab., MD;  Location: [facility] ENDOSCOPY;  Service: Gastroenterology;  Laterality: N/A;   EUS N/A 10/08/2018   Procedure: UPPER ENDOSCOPIC ULTRASOUND (EUS) RADIAL;  Surgeon: Irving Copas., MD;  Location: [facility] ENDOSCOPY;  Service: Gastroenterology;  Laterality: N/A;   PARTIAL HYSTERECTOMY  1986   POLYPECTOMY  10/08/2018   Procedure: POLYPECTOMY;  Surgeon: Mansouraty, Telford Nab., MD;  Location: [facility] ENDOSCOPY;  Service: Gastroenterology;;    Home Medications:  Allergies as of 08/14/2021   No Known Allergies      Medication List        Accurate as of August 14, 2021 11:59 AM. If you have any questions, ask your nurse or doctor.          STOP taking these medications    oxybutynin 10 MG 24 hr tablet Commonly known as: DITROPAN-XL       TAKE these medications  Advair HFA 115-21 MCG/ACT inhaler Generic drug: fluticasone-salmeterol Inhale 2 puffs into the lungs 2 (two) times daily.   albuterol 108 (90 Base) MCG/ACT inhaler Commonly known as: VENTOLIN HFA Inhale 2 puffs into the lungs every 4 (four) hours as needed for wheezing or shortness of breath.   allopurinol 100 MG tablet Commonly known as: ZYLOPRIM Take 100 mg by mouth daily with lunch.   ascorbic acid 1000 MG  tablet Commonly known as: VITAMIN C Take 1,000 mg by mouth 3 (three) times daily.   aspirin EC 81 MG tablet Take 81 mg by mouth daily.   atorvastatin 80 MG tablet Commonly known as: LIPITOR Take 80 mg by mouth daily.   atropine 1 % ophthalmic solution Place 1 drop into the right eye 2 (two) times daily.   Biotin 5000 MCG Tabs Take 5,000 mcg by mouth daily. HAIR/SKIN/NAILS   carvedilol 12.5 MG tablet Commonly known as: COREG Take 12.5 mg by mouth 2 (two) times daily.   cetirizine 10 MG tablet Commonly known as: ZYRTEC Take 10 mg by mouth daily.   Dupixent 300 MG/2ML Sopn Generic drug: Dupilumab Inject 300 mg into the skin every 14 (fourteen) days.   ferrous sulfate 325 (65 FE) MG tablet Take 325 mg by mouth daily.   FLUoxetine 40 MG capsule Commonly known as: PROZAC Take 40 mg by mouth daily.   glipiZIDE 5 MG 24 hr tablet Commonly known as: GLUCOTROL XL Take 1 tablet (5 mg total) by mouth daily.   hydrochlorothiazide 25 MG tablet Commonly known as: HYDRODIURIL Take 25 mg by mouth daily.   HYDROcodone-acetaminophen 10-325 MG tablet Commonly known as: NORCO Take 1 tablet by mouth every 8 (eight) hours as needed. What changed: Another medication with the same name was removed. Continue taking this medication, and follow the directions you see here.   levothyroxine 25 MCG tablet Commonly known as: SYNTHROID Take 25 mcg by mouth daily.   Linzess 290 MCG Caps capsule Generic drug: linaclotide TAKE 1 CAPSULE BY MOUTH DAILY BEFORE BREAKFAST.   LORazepam 1 MG tablet Commonly known as: ATIVAN Take 1 tablet (1 mg total) by mouth 2 (two) times daily as needed for anxiety.   losartan 100 MG tablet Commonly known as: COZAAR Take 100 mg by mouth daily.   metFORMIN 1000 MG tablet Commonly known as: GLUCOPHAGE Take 1,000 mg by mouth 2 (two) times daily.   metoCLOPramide 10 MG tablet Commonly known as: REGLAN Take 1 tablet (10 mg total) by mouth every 8 (eight)  hours as needed for up to 14 days for nausea.   mirabegron ER 50 MG Tb24 tablet Commonly known as: MYRBETRIQ Take 1 tablet (50 mg total) by mouth daily.   omeprazole 20 MG capsule Commonly known as: PRILOSEC Take 20 mg by mouth daily as needed.   prednisoLONE acetate 1 % ophthalmic suspension Commonly known as: PRED FORTE Place 1 drop into the right eye 2 (two) times daily.   pregabalin 75 MG capsule Commonly known as: LYRICA Take 1 capsule (75 mg total) by mouth 2 (two) times daily.   PROBIOTIC PO Take 1 tablet by mouth daily.   Tyler Aas FlexTouch 100 UNIT/ML FlexTouch Pen Generic drug: insulin degludec Inject 15 Units into the skin daily.   Vitamin D-3 125 MCG (5000 UT) Tabs Take 5,000 Units by mouth daily.   vitamin E 45 MG (100 UNITS) capsule Take by mouth.        Allergies:  No Known Allergies  Family History: Family History  Problem Relation Age of Onset   Breast cancer Mother    Heart failure Father    Other Sister        DEGEN.DISC DISEASE   Breast cancer Maternal Grandmother    Kidney cancer Neg Hx    Kidney disease Neg Hx    Prostate cancer Neg Hx     Social History:  reports that she has never smoked. She has never used smokeless tobacco. She reports that she does not drink alcohol and does not use drugs.   Physical Exam: BP 121/72   Pulse 85   Ht 5' (1.524 m)   Wt 191 lb (86.6 kg)   BMI 37.30 kg/m   Constitutional:  Alert and oriented, No acute distress. HEENT: [place] AT, moist mucus membranes.  Trachea midline Cardiovascular: No clubbing, cyanosis, or edema. Respiratory: Normal respiratory effort, no increased work of breathing. Neurologic: Grossly intact, no focal deficits, moving all 4 extremities. Psychiatric: Normal mood and affect.  Laboratory Data: N/A   Pertinent Imaging: Results for orders placed or performed in visit on 08/14/21  BLADDER SCAN AMB NON-IMAGING  Result Value Ref Range   Scan Result 166 ml      Assessment &  Plan:   OAB  - She had improvement on Myrbetriq alone. Continue this medication.  - She is emptying adequately with PVR of 166 ml     Return in about 1 year (around 08/15/2022) for PVR and OAB questionnaire.  [facility] [address] [place], [place] [address] [phone]  I,Kailey Littlejohn,acting as a scribe for [facility], PA-C.,have documented all relevant documentation on the behalf of Aadil Sur, PA-C,as directed by  [facility], PA-C while in the presence of [place], PA-C.  I have reviewed the above documentation for accuracy and completeness, and I agree with the above.    Zara Council, PA-C

## 2021-08-18 ENCOUNTER — Ambulatory Visit: Payer: Medicare HMO | Admitting: Urology

## 2021-09-16 ENCOUNTER — Ambulatory Visit
Admission: RE | Admit: 2021-09-16 | Discharge: 2021-09-16 | Disposition: A | Discharge status: Home / Self Care | Payer: BLUE CROSS/BLUE SHIELD | Source: Ambulatory Visit | Attending: Family Medicine | Admitting: Family Medicine

## 2021-09-16 VITALS — BP 141/76 | HR 87 | Temp 97.7°F | Resp 18

## 2021-09-16 DIAGNOSIS — J45901 Unspecified asthma with (acute) exacerbation: Secondary | ICD-10-CM

## 2021-09-16 DIAGNOSIS — J209 Acute bronchitis, unspecified: Secondary | ICD-10-CM

## 2021-09-16 MED ORDER — AMOXICILLIN-POT CLAVULANATE 875-125 MG PO TABS: 1.0000 | ORAL_TABLET | Freq: Two times a day (BID) | ORAL | 0 refills | Status: AC

## 2021-09-16 MED ORDER — PROMETHAZINE-DM 6.25-15 MG/5ML PO SYRP: 5.0000 mL | ORAL_SOLUTION | Freq: Three times a day (TID) | ORAL | 0 refills | Status: DC | PRN

## 2021-09-16 NOTE — ED Provider Notes (Addendum)
[facility]    CSN: [id number] Arrival date & time: 09/16/21  1002      History   Chief Complaint Chief Complaint  Patient presents with   Cough   APPT 1030    HPI Madeline Martinez is a 76 y.o. female.   HPI Madeline Martinez s a 76 y.o. female with a history of type II diabetes, asthma, CKD III, is here with a one week history of persistent wet cough,  Nasal congestion, runny nose, and fatigue. Patient endorses intermittent wheezing and uses her Advair inhaler as needed. She has been negative for fever.  Her daughter has had a similar pattern of symptoms.  Denies any shortness of breath or chest tightness.  She has been taking over-the-counter medications without significant relief of symptoms.  Her cough is nonproductive however she notes audible chest congestion with coughing. Past Medical History:  Diagnosis Date   Acid reflux    Anxiety    Depression    Diabetes mellitus    Dysrhythmia    Gout    Heart murmur    HTN (hypertension)    Hyperlipidemia    Sleep apnea     Patient Active Problem List   Diagnosis Date Noted   Hypoglycemia 05/18/2021   Fall at home, initial encounter 05/17/2021   Type 2 diabetes mellitus with stage 3a chronic kidney disease, with long-term current use of insulin ([place]) 05/17/2021   Mixed diabetic hyperlipidemia associated with type 2 diabetes mellitus ([place]) 05/17/2021   Acute renal failure superimposed on stage 3a chronic kidney disease ([place]) 37/11/6267   Acute metabolic encephalopathy 48/54/6270   Anxiety    Hypothyroidism    Stage 3a chronic kidney disease ([place]) 06/26/2019   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   Essential hypertension    Hyperlipidemia    Depression    Esophageal reflux 01/24/2010    Past Surgical History:  Procedure Laterality Date   ABDOMINAL HYSTERECTOMY     BACK SURGERY  2007   DISCS   BIOPSY  10/08/2018   Procedure: BIOPSY;  Surgeon: Irving Copas., MD;  Location: [facility];   Service: Gastroenterology;;   COLONOSCOPY WITH PROPOFOL N/A 12/19/2017   Procedure: COLONOSCOPY WITH PROPOFOL;  Surgeon: Jonathon Bellows, MD;  Location: [facility] ENDOSCOPY;  Service: Gastroenterology;  Laterality: N/A;   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 64   BILATERAL   ENDOSCOPIC MUCOSAL RESECTION N/A 10/08/2018   Procedure: ENDOSCOPIC MUCOSAL RESECTION;  Surgeon: Rush Landmark Telford Nab., MD;  Location: [facility] ENDOSCOPY;  Service: Gastroenterology;  Laterality: N/A;   ESOPHAGOGASTRODUODENOSCOPY (EGD) WITH PROPOFOL N/A 08/28/2018   Procedure: ESOPHAGOGASTRODUODENOSCOPY (EGD) WITH PROPOFOL;  Surgeon: Jonathon Bellows, MD;  Location: [facility] ENDOSCOPY;  Service: Gastroenterology;  Laterality: N/A;   ESOPHAGOGASTRODUODENOSCOPY (EGD) WITH PROPOFOL N/A 10/08/2018   Procedure: ESOPHAGOGASTRODUODENOSCOPY (EGD) WITH PROPOFOL;  Surgeon: Rush Landmark Telford Nab., MD;  Location: [facility] ENDOSCOPY;  Service: Gastroenterology;  Laterality: N/A;   EUS N/A 10/08/2018   Procedure: UPPER ENDOSCOPIC ULTRASOUND (EUS) RADIAL;  Surgeon: Irving Copas., MD;  Location: [facility] ENDOSCOPY;  Service: Gastroenterology;  Laterality: N/A;   PARTIAL HYSTERECTOMY  1986   POLYPECTOMY  10/08/2018   Procedure: POLYPECTOMY;  Surgeon: Mansouraty, Telford Nab., MD;  Location: [facility];  Service: Gastroenterology;;    OB History     Gravida  2   Para  1   Term  1   Preterm      AB  1   Living  1      SAB  IAB      Ectopic      Multiple      Live Births               Home Medications    Prior to Admission medications   Medication Sig Start Date End Date Taking? Authorizing Provider  ADVAIR HFA 115-21 MCG/ACT inhaler Inhale 2 puffs into the lungs 2 (two) times daily. 04/25/21  Yes [provider]  amoxicillin-clavulanate (AUGMENTIN) 875-125 MG tablet Take 1 tablet by mouth every 12 (twelve) hours for 10 days. 09/16/21 09/26/21 Yes Scot Jun, FNP  atorvastatin (LIPITOR) 80 MG tablet Take 80 mg by mouth daily.    Yes [provider]  atropine 1 % ophthalmic solution Place 1 drop into the right eye 2 (two) times daily. 04/06/21  Yes [provider]  carvedilol (COREG) 12.5 MG tablet Take 12.5 mg by mouth 2 (two) times daily.   Yes [provider]  Cholecalciferol (VITAMIN D-3) 125 MCG (5000 UT) TABS Take 5,000 Units by mouth daily.   Yes [provider]  DUPIXENT 300 MG/2ML SOPN Inject 300 mg into the skin every 14 (fourteen) days. 05/01/21  Yes [provider]  ferrous sulfate 325 (65 FE) MG tablet Take 325 mg by mouth daily. 12/03/19  Yes [provider]  FLUoxetine (PROZAC) 40 MG capsule Take 40 mg by mouth daily.   Yes [provider]  glipiZIDE (GLUCOTROL XL) 5 MG 24 hr tablet Take 1 tablet (5 mg total) by mouth daily. 05/04/21  Yes Fritzi Mandes, MD  hydrochlorothiazide (HYDRODIURIL) 25 MG tablet Take 25 mg by mouth daily. 06/26/21  Yes [provider]  levothyroxine (SYNTHROID) 25 MCG tablet Take 25 mcg by mouth daily. 02/01/20  Yes [provider]  losartan (COZAAR) 100 MG tablet Take 100 mg by mouth daily. 02/09/20  Yes [provider]  metFORMIN (GLUCOPHAGE) 1000 MG tablet Take 1,000 mg by mouth 2 (two) times daily.   Yes [provider]  mirabegron ER (MYRBETRIQ) 50 MG TB24 tablet Take 1 tablet (50 mg total) by mouth daily. 08/14/21  Yes McGowan, Larene [place] A, PA-C  pregabalin (LYRICA) 75 MG capsule Take 1 capsule (75 mg total) by mouth 2 (two) times daily. 05/20/21  Yes Sidney Ace, MD  Probiotic Product (PROBIOTIC PO) Take 1 tablet by mouth daily.   Yes [provider]  promethazine-dextromethorphan (PROMETHAZINE-DM) 6.25-15 MG/5ML syrup Take 5 mLs by mouth 3 (three) times daily as needed for cough. 09/16/21  Yes Scot Jun, FNP  TRESIBA FLEXTOUCH 100 UNIT/ML FlexTouch Pen Inject 15 Units into the skin daily. 05/04/21  Yes Fritzi Mandes, MD  vitamin E 45 MG (100 UNITS) capsule Take by mouth.    Yes [provider]  albuterol (VENTOLIN HFA) 108 (90 Base) MCG/ACT inhaler Inhale 2 puffs into the lungs every 4 (four) hours as needed for wheezing or shortness of breath. 11/16/20   [provider]  allopurinol (ZYLOPRIM) 100 MG tablet Take 100 mg by mouth daily with lunch.  04/30/16   [provider]  ascorbic acid (VITAMIN C) 1000 MG tablet Take 1,000 mg by mouth 3 (three) times daily.    [provider]  aspirin 81 MG EC tablet Take 81 mg by mouth daily.    [provider]  Biotin 5000 MCG TABS Take 5,000 mcg by mouth daily. HAIR/SKIN/NAILS    [provider]  cetirizine (ZYRTEC) 10 MG tablet Take 10 mg by mouth daily. 04/20/17  [provider]  HYDROcodone-acetaminophen (NORCO) 10-325 MG tablet Take 1 tablet by mouth every 8 (eight) hours as needed. 08/02/21   [provider]  LINZESS 290 MCG CAPS capsule TAKE 1 CAPSULE BY MOUTH DAILY BEFORE BREAKFAST. 10/17/20   Jonathon Bellows, MD  LORazepam (ATIVAN) 1 MG tablet Take 1 tablet (1 mg total) by mouth 2 (two) times daily as needed for anxiety. 05/20/21   Sidney Ace, MD  metoCLOPramide (REGLAN) 10 MG tablet Take 1 tablet (10 mg total) by mouth every 8 (eight) hours as needed for up to 14 days for nausea. 05/20/21 06/03/21  Sidney Ace, MD  omeprazole (PRILOSEC) 20 MG capsule Take 20 mg by mouth daily as needed. 05/24/21   [provider]  prednisoLONE acetate (PRED FORTE) 1 % ophthalmic suspension Place 1 drop into the right eye 2 (two) times daily. 04/24/21   [provider]    Family History Family History  Problem Relation Age of Onset   Breast cancer Mother    Heart failure Father    Other Sister        DEGEN.DISC DISEASE   Breast cancer Maternal Grandmother    Kidney cancer Neg Hx    Kidney disease Neg Hx    Prostate cancer Neg Hx     Social History Social History   Tobacco Use   Smoking status: Never   Smokeless tobacco: Never   Vaping Use   Vaping Use: Never used  Substance Use Topics   Alcohol use: No   Drug use: No     Allergies   Patient has no known allergies.   Review of Systems Review of Systems Pertinent negatives listed in HPI   Physical Exam Triage Vital Signs ED Triage Vitals  Enc Vitals Group     BP 09/16/21 1039 (!) 141/76     Pulse Rate 09/16/21 1039 87     Resp 09/16/21 1039 18     Temp 09/16/21 1039 97.7 F (36.5 C)     Temp Source 09/16/21 1039 Oral     SpO2 09/16/21 1039 95 %     Weight --      Height --      Head Circumference --      Peak Flow --      Pain Score 09/16/21 1046 0     Pain Loc --      Pain Edu? --      Excl. in [place]? --    No data found.  Updated Vital Signs BP (!) 141/76 (BP Location: Left Arm)   Pulse 87   Temp 97.7 F (36.5 C) (Oral)   Resp 18   SpO2 95%   Visual Acuity Right Eye Distance:   Left Eye Distance:   Bilateral Distance:    Right Eye Near:   Left Eye Near:    Bilateral Near:     Physical Exam Constitutional:      Appearance: She is obese. She is not ill-appearing.  HENT:     Head: Normocephalic and atraumatic.     Right Ear: External ear normal.     Left Ear: External ear normal.     Nose: Congestion present.  Eyes:     Extraocular Movements: Extraocular movements intact.     Conjunctiva/sclera: Conjunctivae normal.     Pupils: Pupils are equal, round, and reactive to light.  Cardiovascular:     Rate and Rhythm: Normal rate and regular rhythm.  Pulmonary:     Breath sounds:  Rhonchi present.     Comments: Expiratory wheeze noted on exam. Musculoskeletal:     Cervical back: Normal range of motion and neck supple.  Skin:    General: Skin is warm and dry.     Capillary Refill: Capillary refill takes less than 2 seconds.  Neurological:     General: No focal deficit present.     Mental Status: She is alert.  Psychiatric:        Mood and Affect: Mood normal.        Behavior: Behavior normal.      UC Treatments /  Results  Labs (all labs ordered are listed, but only abnormal results are displayed) Labs Reviewed - No data to display  EKG   Radiology No results found.  Procedures Procedures (including critical care time)  Medications Ordered in UC Medications - No data to display  Initial Impression / Assessment and Plan / UC Course  I have reviewed the triage vital signs and the nursing notes.  Pertinent labs & imaging results that were available during my care of the patient were reviewed by me and considered in my medical decision making (see chart for details).    Acute bronchitis with acute asthma exacerbation Treating patient with Augmentin as she is a insulin-dependent diabetic would like to defer prednisone at this time.  Advised patient to consistently use her Advair along with her rescue inhaler with persistent coughing, chest tightness and wheezing or shortness of breath develop.  Advised to follow-up with pulmonology if symptoms have not improved within the next 5 days.  Return here as needed. Final Clinical Impressions(s) / UC Diagnoses   Final diagnoses:  Acute bronchitis, unspecified organism  Asthma with acute exacerbation, unspecified asthma severity, unspecified whether persistent     Discharge Instructions      If symptoms have not improved within the next 5 days follow-up with your pulmonologist for further work-up and evaluation.  I am deferring placing on any antibiotics due to diabetes however follow-up with your pulmonologist if your symptoms are not readily improving.  Continue Advair, use rescue inhaler for any chest tightness, persistent coughing, wheezing or shortness of breath.  Complete entire course of antibiotics.  Drink plenty of water.     ED Prescriptions     Medication Sig Dispense Auth. Provider   amoxicillin-clavulanate (AUGMENTIN) 875-125 MG tablet Take 1 tablet by mouth every 12 (twelve) hours for 10 days. 20 tablet Scot Jun, FNP    promethazine-dextromethorphan (PROMETHAZINE-DM) 6.25-15 MG/5ML syrup Take 5 mLs by mouth 3 (three) times daily as needed for cough. 140 mL Scot Jun, FNP      PDMP not reviewed this encounter.   Scot Jun, FNP 09/16/21 [place], [place], FNP 09/16/21 1248

## 2021-09-16 NOTE — ED Triage Notes (Addendum)
Patient c/o x nonproductive cough 1 week.   Patient denies SOB or fever. Patient denies sore throat.   Patient endorses chest congestion.   Patient endorses hoarse voice.   Patient has taken Mucinex with no relief of symptoms.   History of Asthma.

## 2021-09-16 NOTE — Discharge Instructions (Addendum)
If symptoms have not improved within the next 5 days follow-up with your pulmonologist for further work-up and evaluation.  I am deferring placing on any antibiotics due to diabetes however follow-up with your pulmonologist if your symptoms are not readily improving.  Continue Advair, use rescue inhaler for any chest tightness, persistent coughing, wheezing or shortness of breath.  Complete entire course of antibiotics.  Drink plenty of water.

## 2022-01-21 ENCOUNTER — Other Ambulatory Visit: Payer: Self-pay

## 2022-01-21 ENCOUNTER — Observation Stay
Admission: EM | Admit: 2022-01-21 | Discharge: 2022-01-24 | Disposition: A | Discharge status: Home Health | Payer: Medicare HMO | Attending: Osteopathic Medicine | Admitting: Osteopathic Medicine

## 2022-01-21 ENCOUNTER — Emergency Department: Payer: Medicare HMO

## 2022-01-21 ENCOUNTER — Observation Stay: Payer: Medicare HMO

## 2022-01-21 DIAGNOSIS — R0602 Shortness of breath: Secondary | ICD-10-CM | POA: Insufficient documentation

## 2022-01-21 DIAGNOSIS — J40 Bronchitis, not specified as acute or chronic: Secondary | ICD-10-CM | POA: Diagnosis not present

## 2022-01-21 DIAGNOSIS — Z79899 Other long term (current) drug therapy: Secondary | ICD-10-CM | POA: Diagnosis not present

## 2022-01-21 DIAGNOSIS — Z794 Long term (current) use of insulin: Secondary | ICD-10-CM | POA: Diagnosis not present

## 2022-01-21 DIAGNOSIS — I129 Hypertensive chronic kidney disease with stage 1 through stage 4 chronic kidney disease, or unspecified chronic kidney disease: Secondary | ICD-10-CM | POA: Diagnosis not present

## 2022-01-21 DIAGNOSIS — R0682 Tachypnea, not elsewhere classified: Secondary | ICD-10-CM | POA: Insufficient documentation

## 2022-01-21 DIAGNOSIS — N179 Acute kidney failure, unspecified: Secondary | ICD-10-CM | POA: Diagnosis not present

## 2022-01-21 DIAGNOSIS — R000 Tachycardia, unspecified: Secondary | ICD-10-CM | POA: Diagnosis not present

## 2022-01-21 DIAGNOSIS — J4551 Severe persistent asthma with (acute) exacerbation: Secondary | ICD-10-CM | POA: Diagnosis not present

## 2022-01-21 DIAGNOSIS — R6883 Chills (without fever): Secondary | ICD-10-CM | POA: Diagnosis not present

## 2022-01-21 DIAGNOSIS — E1165 Type 2 diabetes mellitus with hyperglycemia: Secondary | ICD-10-CM | POA: Diagnosis not present

## 2022-01-21 DIAGNOSIS — M545 Low back pain, unspecified: Secondary | ICD-10-CM | POA: Insufficient documentation

## 2022-01-21 DIAGNOSIS — J4531 Mild persistent asthma with (acute) exacerbation: Principal | ICD-10-CM | POA: Insufficient documentation

## 2022-01-21 DIAGNOSIS — Z7984 Long term (current) use of oral hypoglycemic drugs: Secondary | ICD-10-CM | POA: Insufficient documentation

## 2022-01-21 DIAGNOSIS — J45901 Unspecified asthma with (acute) exacerbation: Secondary | ICD-10-CM | POA: Diagnosis present

## 2022-01-21 DIAGNOSIS — E119 Type 2 diabetes mellitus without complications: Secondary | ICD-10-CM

## 2022-01-21 DIAGNOSIS — Z1152 Encounter for screening for COVID-19: Secondary | ICD-10-CM | POA: Insufficient documentation

## 2022-01-21 DIAGNOSIS — Z7982 Long term (current) use of aspirin: Secondary | ICD-10-CM | POA: Diagnosis not present

## 2022-01-21 DIAGNOSIS — N1831 Acute kidney failure, unspecified: Secondary | ICD-10-CM | POA: Diagnosis present

## 2022-01-21 DIAGNOSIS — E1122 Type 2 diabetes mellitus with diabetic chronic kidney disease: Secondary | ICD-10-CM | POA: Insufficient documentation

## 2022-01-21 DIAGNOSIS — G8929 Other chronic pain: Secondary | ICD-10-CM | POA: Diagnosis not present

## 2022-01-21 DIAGNOSIS — F419 Anxiety disorder, unspecified: Secondary | ICD-10-CM | POA: Diagnosis not present

## 2022-01-21 DIAGNOSIS — J45909 Unspecified asthma, uncomplicated: Secondary | ICD-10-CM | POA: Diagnosis present

## 2022-01-21 LAB — BRAIN NATRIURETIC PEPTIDE: B Natriuretic Peptide: 103.4 pg/mL — ABNORMAL HIGH (ref 0.0–100.0)

## 2022-01-21 LAB — CBC WITH DIFFERENTIAL/PLATELET
Abs Immature Granulocytes: 0.05 10*3/uL (ref 0.00–0.07)
Basophils Absolute: 0.1 10*3/uL (ref 0.0–0.1)
Basophils Relative: 1 %
Eosinophils Absolute: 0.1 10*3/uL (ref 0.0–0.5)
Eosinophils Relative: 1 %
HCT: 36.5 % (ref 36.0–46.0)
Hemoglobin: 11.9 g/dL — ABNORMAL LOW (ref 12.0–15.0)
Immature Granulocytes: 1 %
Lymphocytes Relative: 18 %
Lymphs Abs: 1.7 10*3/uL (ref 0.7–4.0)
MCH: 29.5 pg (ref 26.0–34.0)
MCHC: 32.6 g/dL (ref 30.0–36.0)
MCV: 90.6 fL (ref 80.0–100.0)
Monocytes Absolute: 0.6 10*3/uL (ref 0.1–1.0)
Monocytes Relative: 6 %
Neutro Abs: 7.2 10*3/uL (ref 1.7–7.7)
Neutrophils Relative %: 73 %
Platelets: 387 10*3/uL (ref 150–400)
RBC: 4.03 MIL/uL (ref 3.87–5.11)
RDW: 14.5 % (ref 11.5–15.5)
WBC: 9.6 10*3/uL (ref 4.0–10.5)
nRBC: 0 % (ref 0.0–0.2)

## 2022-01-21 LAB — RESP PANEL BY RT-PCR (FLU A&B, COVID) ARPGX2
Influenza A by PCR: NEGATIVE
Influenza B by PCR: NEGATIVE
SARS Coronavirus 2 by RT PCR: NEGATIVE

## 2022-01-21 LAB — CBG MONITORING, ED: Glucose-Capillary: 453 mg/dL — ABNORMAL HIGH (ref 70–99)

## 2022-01-21 LAB — COMPREHENSIVE METABOLIC PANEL
ALT: 23 U/L (ref 0–44)
AST: 25 U/L (ref 15–41)
Albumin: 3.7 g/dL (ref 3.5–5.0)
Alkaline Phosphatase: 71 U/L (ref 38–126)
Anion gap: 13 (ref 5–15)
BUN: 25 mg/dL — ABNORMAL HIGH (ref 8–23)
CO2: 22 mmol/L (ref 22–32)
Calcium: 9.6 mg/dL (ref 8.9–10.3)
Chloride: 104 mmol/L (ref 98–111)
Creatinine, Ser: 1.14 mg/dL — ABNORMAL HIGH (ref 0.44–1.00)
GFR, Estimated: 50 mL/min — ABNORMAL LOW (ref >60)
Glucose, Bld: 382 mg/dL — ABNORMAL HIGH (ref 70–99)
Potassium: 4 mmol/L (ref 3.5–5.1)
Sodium: 139 mmol/L (ref 135–145)
Total Bilirubin: 0.8 mg/dL (ref 0.3–1.2)
Total Protein: 7.9 g/dL (ref 6.5–8.1)

## 2022-01-21 LAB — PROCALCITONIN: Procalcitonin: <0.1 ng/mL

## 2022-01-21 MED ORDER — LORATADINE 10 MG PO TABS
10.0000 mg | ORAL_TABLET | Freq: Every day | ORAL | Status: DC
  Administered 2022-01-22 – 2022-01-24 (×3): 10 mg via ORAL
  Filled 2022-01-21 (×3): qty 1

## 2022-01-21 MED ORDER — LORAZEPAM 2 MG/ML IJ SOLN
1.0000 mg | Freq: Once | INTRAMUSCULAR | Status: AC
  Administered 2022-01-21: 1 mg via INTRAVENOUS
  Filled 2022-01-21: qty 1

## 2022-01-21 MED ORDER — MIRABEGRON ER 50 MG PO TB24
50.0000 mg | ORAL_TABLET | Freq: Every day | ORAL | Status: DC
  Administered 2022-01-22 – 2022-01-23 (×2): 50 mg via ORAL
  Filled 2022-01-21 (×5): qty 1

## 2022-01-21 MED ORDER — ACETAMINOPHEN 650 MG RE SUPP: 650.0000 mg | Freq: Four times a day (QID) | RECTAL | Status: DC | PRN

## 2022-01-21 MED ORDER — ALBUTEROL SULFATE HFA 108 (90 BASE) MCG/ACT IN AERS: 2.0000 | INHALATION_SPRAY | RESPIRATORY_TRACT | Status: DC | PRN

## 2022-01-21 MED ORDER — ACETAMINOPHEN 325 MG PO TABS: 650.0000 mg | ORAL_TABLET | Freq: Four times a day (QID) | ORAL | Status: DC | PRN

## 2022-01-21 MED ORDER — GUAIFENESIN ER 600 MG PO TB12: 1200.0000 mg | ORAL_TABLET | Freq: Two times a day (BID) | ORAL | Status: DC

## 2022-01-21 MED ORDER — SODIUM CHLORIDE 0.9 % IV BOLUS: 1000.0000 mL | Freq: Once | INTRAVENOUS | Status: DC

## 2022-01-21 MED ORDER — PREGABALIN 75 MG PO CAPS
75.0000 mg | ORAL_CAPSULE | Freq: Two times a day (BID) | ORAL | Status: DC
  Administered 2022-01-22 – 2022-01-24 (×5): 75 mg via ORAL
  Filled 2022-01-21 (×5): qty 1

## 2022-01-21 MED ORDER — ONDANSETRON HCL 4 MG PO TABS: 4.0000 mg | ORAL_TABLET | Freq: Four times a day (QID) | ORAL | Status: DC | PRN

## 2022-01-21 MED ORDER — ATORVASTATIN CALCIUM 20 MG PO TABS
80.0000 mg | ORAL_TABLET | Freq: Every day | ORAL | Status: DC
  Administered 2022-01-22 – 2022-01-24 (×3): 80 mg via ORAL
  Filled 2022-01-21 (×3): qty 4

## 2022-01-21 MED ORDER — IPRATROPIUM-ALBUTEROL 0.5-2.5 (3) MG/3ML IN SOLN
9.0000 mL | Freq: Once | RESPIRATORY_TRACT | Status: AC
  Administered 2022-01-21: 9 mL via RESPIRATORY_TRACT
  Filled 2022-01-21: qty 9

## 2022-01-21 MED ORDER — MAGNESIUM SULFATE 2 GM/50ML IV SOLN
2.0000 g | Freq: Once | INTRAVENOUS | Status: AC
  Administered 2022-01-21: 2 g via INTRAVENOUS
  Filled 2022-01-21: qty 50

## 2022-01-21 MED ORDER — CARVEDILOL 12.5 MG PO TABS
12.5000 mg | ORAL_TABLET | Freq: Two times a day (BID) | ORAL | Status: DC
  Administered 2022-01-21 – 2022-01-24 (×6): 12.5 mg via ORAL
  Filled 2022-01-21 (×5): qty 1
  Filled 2022-01-21: qty 2

## 2022-01-21 MED ORDER — ENOXAPARIN SODIUM 60 MG/0.6ML IJ SOSY
0.5000 mg/kg | PREFILLED_SYRINGE | INTRAMUSCULAR | Status: DC
  Administered 2022-01-22 – 2022-01-23 (×2): 45 mg via SUBCUTANEOUS
  Filled 2022-01-21 (×2): qty 0.6

## 2022-01-21 MED ORDER — LACTATED RINGERS IV SOLN: INTRAVENOUS | Status: AC

## 2022-01-21 MED ORDER — HYDROCODONE-ACETAMINOPHEN 10-325 MG PO TABS
1.0000 | ORAL_TABLET | Freq: Three times a day (TID) | ORAL | Status: DC | PRN
  Administered 2022-01-21 – 2022-01-23 (×4): 1 via ORAL
  Filled 2022-01-21 (×4): qty 1

## 2022-01-21 MED ORDER — ASPIRIN 81 MG PO TBEC
81.0000 mg | DELAYED_RELEASE_TABLET | Freq: Every day | ORAL | Status: DC
  Administered 2022-01-22 – 2022-01-24 (×3): 81 mg via ORAL
  Filled 2022-01-21 (×3): qty 1

## 2022-01-21 MED ORDER — PREDNISOLONE ACETATE 1 % OP SUSP
1.0000 [drp] | Freq: Two times a day (BID) | OPHTHALMIC | Status: DC
  Administered 2022-01-22 – 2022-01-24 (×5): 1 [drp] via OPHTHALMIC
  Filled 2022-01-21: qty 1

## 2022-01-21 MED ORDER — INSULIN ASPART 100 UNIT/ML IJ SOLN
25.0000 [IU] | Freq: Once | INTRAMUSCULAR | Status: AC
  Administered 2022-01-21: 25 [IU] via SUBCUTANEOUS
  Filled 2022-01-21: qty 1

## 2022-01-21 MED ORDER — SODIUM CHLORIDE 0.9% FLUSH
3.0000 mL | Freq: Two times a day (BID) | INTRAVENOUS | Status: DC
  Administered 2022-01-21 – 2022-01-24 (×5): 3 mL via INTRAVENOUS

## 2022-01-21 MED ORDER — ALBUTEROL SULFATE (2.5 MG/3ML) 0.083% IN NEBU
2.5000 mg | INHALATION_SOLUTION | Freq: Once | RESPIRATORY_TRACT | Status: AC
  Administered 2022-01-21: 2.5 mg via RESPIRATORY_TRACT
  Filled 2022-01-21: qty 3

## 2022-01-21 MED ORDER — LEVOTHYROXINE SODIUM 50 MCG PO TABS
25.0000 ug | ORAL_TABLET | Freq: Every day | ORAL | Status: DC
  Administered 2022-01-22 – 2022-01-24 (×3): 25 ug via ORAL
  Filled 2022-01-21 (×3): qty 1

## 2022-01-21 MED ORDER — FLUOXETINE HCL 20 MG PO CAPS
40.0000 mg | ORAL_CAPSULE | Freq: Every day | ORAL | Status: DC
  Administered 2022-01-22 – 2022-01-24 (×3): 40 mg via ORAL
  Filled 2022-01-21 (×3): qty 2

## 2022-01-21 MED ORDER — ONDANSETRON HCL 4 MG/2ML IJ SOLN
4.0000 mg | Freq: Once | INTRAMUSCULAR | Status: DC
  Filled 2022-01-21: qty 2

## 2022-01-21 MED ORDER — METHYLPREDNISOLONE SODIUM SUCC 125 MG IJ SOLR
125.0000 mg | Freq: Once | INTRAMUSCULAR | Status: AC
  Administered 2022-01-21: 125 mg via INTRAVENOUS
  Filled 2022-01-21: qty 2

## 2022-01-21 MED ORDER — INSULIN ASPART 100 UNIT/ML IJ SOLN
0.0000 [IU] | Freq: Three times a day (TID) | INTRAMUSCULAR | Status: DC
  Administered 2022-01-22: 11 [IU] via SUBCUTANEOUS
  Administered 2022-01-22: 7 [IU] via SUBCUTANEOUS
  Administered 2022-01-22: 15 [IU] via SUBCUTANEOUS
  Administered 2022-01-23: 3 [IU] via SUBCUTANEOUS
  Administered 2022-01-23 (×2): 11 [IU] via SUBCUTANEOUS
  Administered 2022-01-24: 4 [IU] via SUBCUTANEOUS
  Filled 2022-01-21 (×7): qty 1

## 2022-01-21 MED ORDER — INSULIN GLARGINE-YFGN 100 UNIT/ML ~~LOC~~ SOLN
10.0000 [IU] | Freq: Every day | SUBCUTANEOUS | Status: DC
  Filled 2022-01-21 (×2): qty 0.1

## 2022-01-21 MED ORDER — ONDANSETRON HCL 4 MG/2ML IJ SOLN: 4.0000 mg | Freq: Four times a day (QID) | INTRAMUSCULAR | Status: DC | PRN

## 2022-01-21 MED ORDER — FLUTICASONE FUROATE-VILANTEROL 200-25 MCG/ACT IN AEPB
1.0000 | INHALATION_SPRAY | Freq: Every day | RESPIRATORY_TRACT | Status: DC
  Administered 2022-01-22 – 2022-01-24 (×3): 1 via RESPIRATORY_TRACT
  Filled 2022-01-21 (×2): qty 28

## 2022-01-21 MED ORDER — LORAZEPAM 1 MG PO TABS
1.0000 mg | ORAL_TABLET | Freq: Three times a day (TID) | ORAL | Status: DC | PRN
  Administered 2022-01-21 – 2022-01-22 (×2): 1 mg via ORAL
  Filled 2022-01-21 (×2): qty 1

## 2022-01-21 MED ORDER — ALBUTEROL SULFATE (2.5 MG/3ML) 0.083% IN NEBU
2.5000 mg | INHALATION_SOLUTION | RESPIRATORY_TRACT | Status: DC
  Administered 2022-01-21 – 2022-01-22 (×3): 2.5 mg via RESPIRATORY_TRACT
  Filled 2022-01-21 (×3): qty 3

## 2022-01-21 MED ORDER — ADULT MULTIVITAMIN W/MINERALS CH
1.0000 | ORAL_TABLET | Freq: Every day | ORAL | Status: DC
  Administered 2022-01-22 – 2022-01-24 (×3): 1 via ORAL
  Filled 2022-01-21 (×3): qty 1

## 2022-01-21 NOTE — ED Notes (Signed)
Called lab for PCR respiratory panal

## 2022-01-21 NOTE — Assessment & Plan Note (Addendum)
Patient presenting with 5-day history of increasing shortness of breath and productive cough with congestion, rhinorrhea and sinus fullness.  On presentation to the ED, she was in significant respiratory distress with tachypnea up to 40s and wheezing on examination.  No oxygen desaturation and patient improved with albuterol treatments and appliance of BiPAP.  She is now off BiPAP and clinically improved.  Suspected trigger of asthma is upper respiratory infection versus pneumonia.  Likely viral in nature, however will obtain a procalcitonin.  In addition, chest x-ray was nondiagnostic, so we will obtain a CT chest to evaluate for any structural abnormalities or presence of opacities.   - Continue albuterol every 4 hours scheduled - S/p Solu-Medrol 125 mg - Start prednisone 60 mg tomorrow - Procalcitonin pending - CT chest pending

## 2022-01-21 NOTE — ED Notes (Signed)
Pt reporting anxiety and nausea with dry heaving - notified dr. Jacelyn Grip - see new orders.

## 2022-01-21 NOTE — ED Notes (Signed)
Pt reporting slight improvement in breathing - RR remains in 35-40. Pt with junky cough

## 2022-01-21 NOTE — ED Notes (Signed)
Tolerating Bipap- respirations unlabored at this time. Pt voices no needs monitors in place,

## 2022-01-21 NOTE — Assessment & Plan Note (Signed)
Patient appears quite anxious on examination, however she has missed her Ativan doses for the last couple days.  - Restart home anxiety medications including home Ativan

## 2022-01-21 NOTE — H&P (Signed)
History and Physical    Patient: Madeline Martinez DPO:[id number] DOB: 11-03-1945 DOA: 01/21/2022 DOS: the patient was seen and examined on 01/21/2022 PCP: [name], MD  Patient coming from: Home  Chief Complaint:  Chief Complaint  Patient presents with   Shortness of Breath   HPI: Madeline Martinez is a 76 y.o. female with medical history significant of asthma, type 2 diabetes, anemia of chronic disease, CKD stage IIIa, hypertension, OSA, presents to the ED with complaints of shortness of breath.  [name] states that on 11/21, her family went to the beach and it was quite cold.  After sitting outside, she noticed that her breathing began to worsen.  Since that time, her breathing did not return back to baseline and has continued to gradually worsen.  She has been been experiencing a productive cough, sinus congestion, and runny nose but denies any chest pain, palpitations, lower extremity edema.  She has been using her inhalers more frequently without relief.  She endorses chills with poor appetite but denies any fevers, nausea, vomiting, diarrhea.  Due to poor appetite, she has not been taking her home medications as frequently, including her home Ativan and Norco.  ED course: On arrival to the ED, patient was afebrile at 97.8.  She was significantly tachypneic at 40/minute saturating at 96% on room air.  She was normotensive at 137/74 with heart rate of 96.  Since that time, her heart rate has increased and is sustaining at approximately 125. COVID-19 PCR and influenza PCR negative.  Chest x-ray with low lung volumes but some congestion noted.  BNP mildly elevated at 103.  Due to asthma exacerbation, [facility] contacted for admission  Review of Systems: As mentioned in the history of present illness. All other systems reviewed and are negative.  Past Medical History:  Diagnosis Date   Acid reflux    Anxiety    Depression    Diabetes mellitus    Dysrhythmia    Gout    Heart murmur     HTN (hypertension)    Hyperlipidemia    Sleep apnea    Past Surgical History:  Procedure Laterality Date   ABDOMINAL HYSTERECTOMY     BACK SURGERY  2007   DISCS   BIOPSY  10/08/2018   Procedure: BIOPSY;  Surgeon: Irving Copas., MD;  Location: [facility];  Service: Gastroenterology;;   COLONOSCOPY WITH PROPOFOL N/A 12/19/2017   Procedure: COLONOSCOPY WITH PROPOFOL;  Surgeon: Jonathon Bellows, MD;  Location: [facility] ENDOSCOPY;  Service: Gastroenterology;  Laterality: N/A;   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 76   BILATERAL   ENDOSCOPIC MUCOSAL RESECTION N/A 10/08/2018   Procedure: ENDOSCOPIC MUCOSAL RESECTION;  Surgeon: Rush Landmark Telford Nab., MD;  Location: [facility]COPY;  Service: Gastroenterology;  Laterality: N/A;   ESOPHAGOGASTRODUODENOSCOPY (EGD) WITH PROPOFOL N/A 08/28/2018   Procedure: ESOPHAGOGASTRODUODENOSCOPY (EGD) WITH PROPOFOL;  Surgeon: Jonathon Bellows, MD;  Location: [facility] ENDOSCOPY;  Service: Gastroenterology;  Laterality: N/A;   ESOPHAGOGASTRODUODENOSCOPY (EGD) WITH PROPOFOL N/A 10/08/2018   Procedure: ESOPHAGOGASTRODUODENOSCOPY (EGD) WITH PROPOFOL;  Surgeon: Rush Landmark Telford Nab., MD;  Location: [facility]COPY;  Service: Gastroenterology;  Laterality: N/A;   EUS N/A 10/08/2018   Procedure: UPPER ENDOSCOPIC ULTRASOUND (EUS) RADIAL;  Surgeon: Irving Copas., MD;  Location: [facility];  Service: Gastroenterology;  Laterality: N/A;   PARTIAL HYSTERECTOMY  1986   POLYPECTOMY  10/08/2018   Procedure: POLYPECTOMY;  Surgeon: Mansouraty, Telford Nab., MD;  Location: [facility];  Service: Gastroenterology;;   Social History:  reports that she has never  smoked. She has never used smokeless tobacco. She reports that she does not drink alcohol and does not use drugs.  No Known Allergies  Family History  Problem Relation Age of Onset   Breast cancer Mother    Heart failure Father    Other Sister        DEGEN.DISC DISEASE   Breast cancer Maternal Grandmother    Kidney cancer  Neg Hx    Kidney disease Neg Hx    Prostate cancer Neg Hx     Prior to Admission medications   Medication Sig Start Date End Date Taking? Authorizing Provider  ADVAIR HFA 115-21 MCG/ACT inhaler Inhale 2 puffs into the lungs 2 (two) times daily. 04/25/21   [provider]  albuterol (VENTOLIN HFA) 108 (90 Base) MCG/ACT inhaler Inhale 2 puffs into the lungs every 4 (four) hours as needed for wheezing or shortness of breath. 11/16/20   [provider]  allopurinol (ZYLOPRIM) 100 MG tablet Take 100 mg by mouth daily with lunch.  04/30/16   [provider]  ascorbic acid (VITAMIN C) 1000 MG tablet Take 1,000 mg by mouth 3 (three) times daily.    [provider]  aspirin 81 MG EC tablet Take 81 mg by mouth daily.    [provider]  atorvastatin (LIPITOR) 80 MG tablet Take 80 mg by mouth daily.    [provider]  atropine 1 % ophthalmic solution Place 1 drop into the right eye 2 (two) times daily. 04/06/21   [provider]  Biotin 5000 MCG TABS Take 5,000 mcg by mouth daily. HAIR/SKIN/NAILS    [provider]  carvedilol (COREG) 12.5 MG tablet Take 12.5 mg by mouth 2 (two) times daily.    [provider]  cetirizine (ZYRTEC) 10 MG tablet Take 10 mg by mouth daily. 04/20/17   [provider]  Cholecalciferol (VITAMIN D-3) 125 MCG (5000 UT) TABS Take 5,000 Units by mouth daily.    [provider]  DUPIXENT 300 MG/2ML SOPN Inject 300 mg into the skin every 14 (fourteen) days. 05/01/21   [provider]  ferrous sulfate 325 (65 FE) MG tablet Take 325 mg by mouth daily. 12/03/19   [provider]  FLUoxetine (PROZAC) 40 MG capsule Take 40 mg by mouth daily.    [provider]  glipiZIDE (GLUCOTROL XL) 5 MG 24 hr tablet Take 1 tablet (5 mg total) by mouth daily. 05/04/21   Fritzi Mandes, MD  hydrochlorothiazide (HYDRODIURIL) 25 MG tablet Take 25 mg by mouth daily. 06/26/21   [provider]  HYDROcodone-acetaminophen (NORCO) 10-325 MG tablet Take 1 tablet by mouth every 8 (eight) hours as needed. 08/02/21   [provider]  levothyroxine (SYNTHROID) 25 MCG tablet Take 25 mcg by mouth daily. 02/01/20   [provider]  LINZESS 290 MCG CAPS capsule TAKE 1 CAPSULE BY MOUTH DAILY BEFORE BREAKFAST. 10/17/20   Jonathon Bellows, MD  LORazepam (ATIVAN) 1 MG tablet Take 1 tablet (1 mg total) by mouth 2 (two) times daily as needed for anxiety. 05/20/21   Sreenath, Trula Slade, MD  losartan (COZAAR) 100 MG tablet Take 100 mg by mouth daily. 02/09/20   [provider]  metFORMIN (GLUCOPHAGE) 1000 MG tablet Take 1,000 mg by mouth 2 (two) times daily.    [provider]  metoCLOPramide (REGLAN) 10 MG tablet Take 1 tablet (10 mg total) by mouth every 8 (eight) hours as needed for up to 14 days for nausea. 05/20/21  06/03/21  Sidney Ace, MD  mirabegron ER (MYRBETRIQ) 50 MG TB24 tablet Take 1 tablet (50 mg total) by mouth daily. 08/14/21   Zara Council A, PA-C  omeprazole (PRILOSEC) 20 MG capsule Take 20 mg by mouth daily as needed. 05/24/21   [provider]  prednisoLONE acetate (PRED FORTE) 1 % ophthalmic suspension Place 1 drop into the right eye 2 (two) times daily. 04/24/21   [provider]  pregabalin (LYRICA) 75 MG capsule Take 1 capsule (75 mg total) by mouth 2 (two) times daily. 05/20/21   Sidney Ace, MD  Probiotic Product (PROBIOTIC PO) Take 1 tablet by mouth daily.    [provider]  promethazine-dextromethorphan (PROMETHAZINE-DM) 6.25-15 MG/5ML syrup Take 5 mLs by mouth 3 (three) times daily as needed for cough. 09/16/21   Scot Jun, FNP  TRESIBA FLEXTOUCH 100 UNIT/ML FlexTouch Pen Inject 15 Units into the skin daily. 05/04/21   Fritzi Mandes, MD  vitamin E 45 MG (100 UNITS) capsule Take by mouth.    [provider]    Physical Exam: Vitals:   01/21/22 1500 01/21/22 1540 01/21/22 1601 01/21/22 1607  BP:  (!) 147/82 (!) 152/73  (!) 152/73  Pulse: (!) 124 (!) 103  (!) 112  Resp: (!) 21 (!) 24  (!) 28  Temp:    98.7 F (37.1 C)  TempSrc:    Axillary  SpO2:    95%  Weight:   89.4 kg    Physical Exam Vitals and nursing note reviewed.  Constitutional:      General: She is not in acute distress.    Appearance: She is obese. She is not toxic-appearing.  HENT:     Head: Normocephalic and atraumatic.  Eyes:     Extraocular Movements: Extraocular movements intact.     Pupils: Pupils are equal, round, and reactive to light.  Cardiovascular:     Rate and Rhythm: Regular rhythm. Tachycardia present.     Heart sounds: No murmur heard. Pulmonary:     Effort: Tachypnea present. No accessory muscle usage or respiratory distress.     Breath sounds: Wheezing (Intermittent expiratory wheezing throughout, mild) present. No decreased breath sounds, rhonchi or rales.  Abdominal:     Palpations: Abdomen is soft.     Tenderness: There is no abdominal tenderness. There is no guarding.  Musculoskeletal:     Right lower leg: No edema.     Left lower leg: No edema.  Skin:    General: Skin is warm and dry.  Neurological:     General: No focal deficit present.     Mental Status: She is alert and oriented to person, place, and time.  Psychiatric:        Mood and Affect: Mood is anxious.    Data Reviewed: CBC with WBC of 9.6, hemoglobin of 11.9, and platelets of 387.  CMP with sodium of 139, potassium 4.0, glucose of 382, BUN of 25, creatinine of 1.14 with GFR 50.  BNP 103.  COVID-19 PCR and influenza PCR  EKG personally reviewed.  Sinus rhythm with rate of 93.  No ST elevation or depression.  DG Chest Port 1 View  Result Date: 01/21/2022 CLINICAL DATA:  Shortness of breath and cough. EXAM: PORTABLE CHEST 1 VIEW COMPARISON:  05/17/2021 FINDINGS: 1052 hours. Low volume film. Cardiopericardial silhouette is at upper limits of normal for size. There is pulmonary vascular congestion without overt pulmonary  edema. No pleural effusion. The visualized bony structures of the thorax  are unremarkable. Telemetry leads overlie the chest. IMPRESSION: Low volume film with pulmonary vascular congestion. Electronically Signed   By: Misty Stanley M.D.   On: 01/21/2022 11:13    There are no new results to review at this time.  Assessment and Plan: * Asthma exacerbation Patient presenting with 5-day history of increasing shortness of breath and productive cough with congestion, rhinorrhea and sinus fullness.  On presentation to the ED, she was in significant respiratory distress with tachypnea up to 40s and wheezing on examination.  No oxygen desaturation and patient improved with albuterol treatments and appliance of BiPAP.  She is now off BiPAP and clinically improved.  Suspected trigger of asthma is upper respiratory infection versus pneumonia.  Likely viral in nature, however will obtain a procalcitonin.  In addition, chest x-ray was nondiagnostic, so we will obtain a CT chest to evaluate for any structural abnormalities or presence of opacities.   - Continue albuterol every 4 hours scheduled - S/p Solu-Medrol 125 mg - Start prednisone 60 mg tomorrow - Procalcitonin pending - CT chest pending  Sinus tachycardia Initially, heart rate within normal limits however patient has become significantly tachycardic since presentation.  Heart rate sustaining at approximately 120-125.  I suspect this is multifactorial in the setting of multiple albuterol treatments, dehydration and anxiety.  She has missed multiple doses of her home Ativan and is significantly anxious and even may be withdrawing.  Will monitor with telemetry overnight to ensure resolution.  -Telemetry monitoring - Restart home Ativan  Acute renal failure superimposed on stage 3a chronic kidney disease ([place]) Likely due to poor p.o. intake over the last several days.  Although patient's BNP is slightly elevated, she appears hypovolemic on  examination.  - Gentle hydration - Repeat BMP in the a.m. - Monitor urine output - Hold home nephrotoxic agents  Anxiety Patient appears quite anxious on examination, however she has missed her Ativan doses for the last couple days.  - Restart home anxiety medications including home Ativan  Type 2 diabetes mellitus with stage 3a chronic kidney disease, with long-term current use of insulin ([place]) - Hold home antiglycemic agents - A1c pending - SSI, resistant  Advance Care Planning:   Code Status: Full Code   Consults: None  Family Communication: Patient's daughter updated at bedside  Severity of Illness: The appropriate patient status for this patient is OBSERVATION. Observation status is judged to be reasonable and necessary in order to provide the required intensity of service to ensure the patient's safety. The patient's presenting symptoms, physical exam findings, and initial radiographic and laboratory data in the context of their medical condition is felt to place them at decreased risk for further clinical deterioration. Furthermore, it is anticipated that the patient will be medically stable for discharge from the hospital within 2 midnights of admission.   Author: Jose Persia, MD 01/21/2022 4:52 PM  For on call review [email or web address].

## 2022-01-21 NOTE — Assessment & Plan Note (Signed)
Likely due to poor p.o. intake over the last several days.  Although patient's BNP is slightly elevated, she appears hypovolemic on examination.  - Gentle hydration - Repeat BMP in the a.m. - Monitor urine output - Hold home nephrotoxic agents

## 2022-01-21 NOTE — ED Notes (Signed)
Bipap placed by RT. 

## 2022-01-21 NOTE — ED Notes (Signed)
BiPap removed per MD request - notified RT

## 2022-01-21 NOTE — ED Notes (Addendum)
BG 453 - paged dr. Charleen Kirks for orders

## 2022-01-21 NOTE — Progress Notes (Signed)
PHARMACIST - PHYSICIAN COMMUNICATION  CONCERNING:  Enoxaparin (Lovenox) for DVT Prophylaxis    RECOMMENDATION: Patient was prescribed enoxaprin '40mg'$  q24 hours for VTE prophylaxis.   Filed Weights   01/21/22 1601  Weight: 89.4 kg (197 lb)    Body mass index is 38.47 kg/m.  Estimated Creatinine Clearance: 42.5 mL/min (A) (by C-G formula based on SCr of 1.14 mg/dL (H)).   Based on [place] patient is candidate for enoxaparin 0.'5mg'$ /kg TBW SQ every 24 hours based on BMI being >30.   DESCRIPTION: Pharmacy has adjusted enoxaparin dose per [facility] policy.  Patient is now receiving enoxaparin 45 mg every 24 hours    Lorin Picket, PharmD Clinical Pharmacist  01/21/2022 4:27 PM

## 2022-01-21 NOTE — ED Triage Notes (Signed)
Pt states shortness of breath and cough for the last 3 days. Pt states her daughter was diagnosed with covid back in September and was having sinus infections. Pt states her cough has been so bad she feels like she is going to faint. Pt states no bowel movement in 3 days either. Pt taken to urgent care today and was told to come to the ED

## 2022-01-21 NOTE — ED Notes (Signed)
Attempted to ambulate, pt with increasing wheezing and shortness of breath - unable to stand due to breathing difficulties - notifeid dr. Jacelyn Grip

## 2022-01-21 NOTE — Assessment & Plan Note (Signed)
-   Hold home antiglycemic agents - A1c pending - SSI, resistant

## 2022-01-21 NOTE — ED Notes (Signed)
Pt to ct 

## 2022-01-21 NOTE — ED Provider Notes (Addendum)
[facility] Provider Note    Event Date/Time   First MD Initiated Contact with Patient 01/21/22 1015     (approximate)   History   Shortness of Breath   HPI  Madeline Martinez is a 76 y.o. female   Past medical history of diabetes, hypertension, hyperlipidemia, asthma, anxiety and depression who presents emergency department with 3 days of productive cough, shortness of breath, wheezing.  Also nasal congestion.  She denies fever or chills.  Denies chest pain.  She took her albuterol inhaler only 1 time with some transient relief of her shortness of breath.    History was obtained via the patient. Her daughter is present at bedside as an independent historian who tells me that the patient took some albuterol at home with some relief of her shortness of breath Friday. I reviewed external medical notes including an emergency department visit on 09/16/2021 where she presented for productive cough diagnosed with acute asthma exacerbation and discharged.      Physical Exam   Triage Vital Signs: ED Triage Vitals  Enc Vitals Group     BP 01/21/22 0958 137/74     Pulse Rate 01/21/22 0958 96     Resp 01/21/22 0958 (!) 40     Temp 01/21/22 0958 97.8 F (36.6 C)     Temp src --      SpO2 01/21/22 0958 96 %     Weight --      Height --      Head Circumference --      Peak Flow --      Pain Score 01/21/22 0959 5     Pain Loc --      Pain Edu? --      Excl. in [place]? --     Most recent vital signs: Vitals:   01/21/22 1300 01/21/22 1428  BP: (!) 148/79 (!) 149/93  Pulse: 97 (!) 112  Resp: (!) 26 (!) 27  Temp:    SpO2:  96%    General: Awake, maintaining airway, tachypneic, speaking in short sentences. CV:  Good peripheral perfusion.  Resp:  Normal effort.  Lungs with some scant wheezing at apices, rhonchi at right base greater than left. Abd:  No distention.  Abdomen is soft and nontender.    ED Results / Procedures / Treatments   Labs (all labs  ordered are listed, but only abnormal results are displayed) Labs Reviewed  COMPREHENSIVE METABOLIC PANEL - Abnormal; Notable for the following components:      Result Value   Glucose, Bld 382 (*)    BUN 25 (*)    Creatinine, Ser 1.14 (*)    GFR, Estimated 50 (*)    All other components within normal limits  CBC WITH DIFFERENTIAL/PLATELET - Abnormal; Notable for the following components:   Hemoglobin 11.9 (*)    All other components within normal limits  BRAIN NATRIURETIC PEPTIDE - Abnormal; Notable for the following components:   B Natriuretic Peptide 103.4 (*)    All other components within normal limits  RESP PANEL BY RT-PCR (FLU A&B, COVID) [id number]     I reviewed labs and they are notable for glucose of 380 and a normal anion gap.  EKG  ED ECG REPORT I, Lucillie Garfinkel, the attending physician, personally viewed and interpreted this ECG.   Date: 01/21/2022  EKG Time: 100  Rate: 93  Rhythm: normal sinus rhythm  Axis: nl  Intervals:none  ST&T Change: No acute ischemic changes  RADIOLOGY I independently reviewed and interpreted chest x-ray and see no obvious focalities or pneumothorax.   PROCEDURES:  Critical Care performed: Yes, see critical care procedure note(s)  .Critical Care  Performed by: Lucillie Garfinkel, MD Authorized by: Lucillie Garfinkel, MD   Critical care provider statement:    Critical care time (minutes):  30   Critical care was necessary to treat or prevent imminent or life-threatening deterioration of the following conditions:  Respiratory failure   Critical care was time spent personally by me on the following activities:  Development of treatment plan with patient or surrogate, discussions with consultants, evaluation of patient's response to treatment, examination of patient, ordering and review of laboratory studies, ordering and review of radiographic studies, ordering and performing treatments and interventions, pulse oximetry, re-evaluation of patient's  condition and review of [facility]: Medications  ondansetron (ZOFRAN) injection 4 mg (4 mg Intravenous Not Given 01/21/22 1134)  albuterol (PROVENTIL) (2.5 MG/3ML) 0.083% nebulizer solution 2.5 mg (has no administration in time range)  ipratropium-albuterol (DUONEB) 0.5-2.5 (3) MG/3ML nebulizer solution 9 mL (9 mLs Nebulization Given 01/21/22 1022)  methylPREDNISolone sodium succinate (SOLU-MEDROL) 125 mg/2 mL injection 125 mg (125 mg Intravenous Given 01/21/22 1039)  magnesium sulfate IVPB 2 g 50 mL (0 g Intravenous Stopped 01/21/22 1312)  LORazepam (ATIVAN) injection 1 mg (1 mg Intravenous Given 01/21/22 1130)    Consultants:  I spoke with hospitalist for admission and regarding care plan for this patient.   IMPRESSION / MDM / ASSESSMENT AND PLAN / ED COURSE  I reviewed the triage vital signs and the nursing notes.                              Differential diagnosis includes, but is not limited to, viral upper respiratory infection, bacterial pneumonia, asthma exacerbation, considered but I think less likely ACS, PE.   The patient is on the cardiac monitor to evaluate for evidence of arrhythmia and/or significant heart rate changes.  MDM: Patient with history of asthma and shortness of breath and scant wheezing that was improved with bronchodilators at home, I think she is having asthma exacerbation in the setting of a upper respiratory infectious process.  Check viral swabs and chest x-ray as well as basic labs.  Treat with DuoNebs and Solu-Medrol.  The patient had some symptomatic relief with DuoNebs and Solu-Medrol.  However she is hyperglycemic with history of diabetes, will defer further steroids at the moment.  She continued to have increased work of breathing so was placed on BiPAP and given magnesium, significant symptomatic relief and the patient was able to come off of BiPAP and still continued to feel well.  Her work of breathing improved as  well.  Wheezing improved.  Upon trying to ambulate with walker, to check ambulatory sats, patient became dyspneic upon very minimal exertion.  She is typically able to ambulate and perform activities of daily living with walker at home.  We will give another albuterol nebulizer treatment; admit.  Considered alternative diagnoses like PE or chf but this patient has respiratory infectious symptoms of nasal congestion and productive cough with wheezing on auscultation that was responsive to bronchodilation, so I think PE/chf is less likely. Also not clinically fluid overloaded.    Patient's presentation is most consistent with acute presentation with potential threat to life or bodily function.       FINAL CLINICAL IMPRESSION(S) / ED DIAGNOSES  Final diagnoses:  Mild persistent asthma with exacerbation  Bronchitis     Rx / DC Orders   ED Discharge Orders     None        Note:  This document was prepared using Dragon voice recognition software and may include unintentional dictation errors.    Lucillie Garfinkel, MD 01/21/22 1432    Lucillie Garfinkel, MD 01/21/22 1453    Lucillie Garfinkel, MD 01/21/22 1640

## 2022-01-21 NOTE — ED Notes (Signed)
Report to Rachel, RN.

## 2022-01-21 NOTE — Assessment & Plan Note (Signed)
Initially, heart rate within normal limits however patient has become significantly tachycardic since presentation.  Heart rate sustaining at approximately 120-125.  I suspect this is multifactorial in the setting of multiple albuterol treatments, dehydration and anxiety.  She has missed multiple doses of her home Ativan and is significantly anxious and even may be withdrawing.  Will monitor with telemetry overnight to ensure resolution.  -Telemetry monitoring - Restart home Ativan

## 2022-01-22 ENCOUNTER — Encounter: Payer: Self-pay | Admitting: Internal Medicine

## 2022-01-22 DIAGNOSIS — J4531 Mild persistent asthma with (acute) exacerbation: Secondary | ICD-10-CM

## 2022-01-22 LAB — RESPIRATORY PANEL BY PCR

## 2022-01-22 LAB — BASIC METABOLIC PANEL
Anion gap: 11 (ref 5–15)
BUN: 31 mg/dL — ABNORMAL HIGH (ref 8–23)
CO2: 23 mmol/L (ref 22–32)
Calcium: 9 mg/dL (ref 8.9–10.3)
Chloride: 107 mmol/L (ref 98–111)
Creatinine, Ser: 1.06 mg/dL — ABNORMAL HIGH (ref 0.44–1.00)
GFR, Estimated: 55 mL/min — ABNORMAL LOW (ref >60)
Glucose, Bld: 192 mg/dL — ABNORMAL HIGH (ref 70–99)
Potassium: 3.8 mmol/L (ref 3.5–5.1)
Sodium: 141 mmol/L (ref 135–145)

## 2022-01-22 LAB — CBC
HCT: 34.3 % — ABNORMAL LOW (ref 36.0–46.0)
Hemoglobin: 11.3 g/dL — ABNORMAL LOW (ref 12.0–15.0)
MCH: 29.9 pg (ref 26.0–34.0)
MCHC: 32.9 g/dL (ref 30.0–36.0)
MCV: 90.7 fL (ref 80.0–100.0)
Platelets: 372 10*3/uL (ref 150–400)
RBC: 3.78 MIL/uL — ABNORMAL LOW (ref 3.87–5.11)
RDW: 15.1 % (ref 11.5–15.5)
WBC: 12.2 10*3/uL — ABNORMAL HIGH (ref 4.0–10.5)
nRBC: 0.2 % (ref 0.0–0.2)

## 2022-01-22 LAB — GLUCOSE, CAPILLARY
Glucose-Capillary: 203 mg/dL — ABNORMAL HIGH (ref 70–99)
Glucose-Capillary: 284 mg/dL — ABNORMAL HIGH (ref 70–99)
Glucose-Capillary: 312 mg/dL — ABNORMAL HIGH (ref 70–99)
Glucose-Capillary: 251 mg/dL — ABNORMAL HIGH (ref 70–99)

## 2022-01-22 MED ORDER — ALBUTEROL SULFATE (2.5 MG/3ML) 0.083% IN NEBU
2.5000 mg | INHALATION_SOLUTION | Freq: Four times a day (QID) | RESPIRATORY_TRACT | Status: DC
  Administered 2022-01-23 (×2): 2.5 mg via RESPIRATORY_TRACT
  Filled 2022-01-22 (×2): qty 3

## 2022-01-22 MED ORDER — HYDROCHLOROTHIAZIDE 25 MG PO TABS
25.0000 mg | ORAL_TABLET | Freq: Every day | ORAL | Status: DC
  Administered 2022-01-22 – 2022-01-24 (×3): 25 mg via ORAL
  Filled 2022-01-22 (×3): qty 1

## 2022-01-22 MED ORDER — LOSARTAN POTASSIUM 50 MG PO TABS
100.0000 mg | ORAL_TABLET | Freq: Every day | ORAL | Status: DC
  Administered 2022-01-22 – 2022-01-24 (×3): 100 mg via ORAL
  Filled 2022-01-22 (×3): qty 2

## 2022-01-22 MED ORDER — LIDOCAINE 5 % EX PTCH
1.0000 | MEDICATED_PATCH | CUTANEOUS | Status: DC
  Administered 2022-01-22 – 2022-01-23 (×2): 1 via TRANSDERMAL
  Filled 2022-01-22 (×2): qty 1

## 2022-01-22 MED ORDER — GUAIFENESIN-DM 100-10 MG/5ML PO SYRP
5.0000 mL | ORAL_SOLUTION | Freq: Four times a day (QID) | ORAL | Status: DC | PRN
  Administered 2022-01-22 – 2022-01-23 (×3): 5 mL via ORAL
  Filled 2022-01-22 (×3): qty 10

## 2022-01-22 MED ORDER — ATROPINE SULFATE 1 % OP SOLN
1.0000 [drp] | Freq: Two times a day (BID) | OPHTHALMIC | Status: DC
  Administered 2022-01-22 – 2022-01-24 (×5): 1 [drp] via OPHTHALMIC
  Filled 2022-01-22: qty 2

## 2022-01-22 MED ORDER — PREDNISONE 20 MG PO TABS
40.0000 mg | ORAL_TABLET | Freq: Every day | ORAL | Status: DC
  Administered 2022-01-22 – 2022-01-24 (×3): 40 mg via ORAL
  Filled 2022-01-22 (×3): qty 2

## 2022-01-22 MED ORDER — MIRTAZAPINE 15 MG PO TABS
15.0000 mg | ORAL_TABLET | Freq: Every day | ORAL | Status: DC
  Administered 2022-01-22 – 2022-01-23 (×2): 15 mg via ORAL
  Filled 2022-01-22 (×2): qty 1

## 2022-01-22 MED ORDER — INSULIN GLARGINE-YFGN 100 UNIT/ML ~~LOC~~ SOLN
10.0000 [IU] | Freq: Two times a day (BID) | SUBCUTANEOUS | Status: DC
  Administered 2022-01-22 – 2022-01-23 (×3): 10 [IU] via SUBCUTANEOUS
  Filled 2022-01-22 (×4): qty 0.1

## 2022-01-22 MED ORDER — VITAMIN D 25 MCG (1000 UNIT) PO TABS
5000.0000 [IU] | ORAL_TABLET | Freq: Every day | ORAL | Status: DC
  Administered 2022-01-22 – 2022-01-24 (×3): 5000 [IU] via ORAL
  Filled 2022-01-22 (×3): qty 5

## 2022-01-22 MED ORDER — ALBUTEROL SULFATE (2.5 MG/3ML) 0.083% IN NEBU
2.5000 mg | INHALATION_SOLUTION | RESPIRATORY_TRACT | Status: DC
  Administered 2022-01-22 (×4): 2.5 mg via RESPIRATORY_TRACT
  Filled 2022-01-22 (×4): qty 3

## 2022-01-22 NOTE — TOC Initial Note (Signed)
Transition of Care [facility]) - Initial/Assessment Note    Patient Details  Name: Madeline Martinez MRN: [id number] Date of Birth: 1945/03/07  Transition of Care [facility]) CM/SW Contact:    Beverly Sessions, RN Phone Number: 01/22/2022, 12:13 PM  Clinical Narrative:                       Transition of Care [facility]) Screening Note   Patient Details  Name: Madeline Martinez Date of Birth: [birth year]   Transition of Care [facility]) CM/SW Contact:    Beverly Sessions, RN Phone Number: 01/22/2022, 12:13 PM    Transition of Care Department [facility]) has reviewed patient and no TOC needs have been identified at this time. We will continue to monitor patient advancement through interdisciplinary progression rounds. If new patient transition needs arise, please place a TOC consult.     Patient Goals and CMS Choice        Expected Discharge Plan and Services                                                Prior Living Arrangements/Services                       Activities of Daily Living Home Assistive Devices/Equipment: None ADL Screening (condition at time of admission) Patient's cognitive ability adequate to safely complete daily activities?: Yes Is the patient deaf or have difficulty hearing?: No Does the patient have difficulty seeing, even when wearing glasses/contacts?: Yes Does the patient have difficulty concentrating, remembering, or making decisions?: No Patient able to express need for assistance with ADLs?: Yes Does the patient have difficulty dressing or bathing?: Yes Independently performs ADLs?: No Does the patient have difficulty walking or climbing stairs?: Yes Weakness of Legs: Both Weakness of Arms/Hands: None  Permission Sought/Granted                  Emotional Assessment              Admission diagnosis:  Bronchitis [J40] Severe persistent asthma with exacerbation [J45.51] Asthma exacerbation [J45.901] Patient Active Problem  List   Diagnosis Date Noted   Asthma exacerbation 01/21/2022   Sinus tachycardia 01/21/2022   Hypoglycemia 05/18/2021   Fall at home, initial encounter 05/17/2021   Type 2 diabetes mellitus with stage 3a chronic kidney disease, with long-term current use of insulin ([place]) 05/17/2021   Mixed diabetic hyperlipidemia associated with type 2 diabetes mellitus ([place]) 05/17/2021   Acute renal failure superimposed on stage 3a chronic kidney disease ([place]) 86/76/7209   Acute metabolic encephalopathy 47/10/6281   Anxiety    Hypothyroidism    Stage 3a chronic kidney disease ([place]) 06/26/2019   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

## 2022-01-22 NOTE — Inpatient Diabetes Management (Signed)
Inpatient Diabetes Program Recommendations  AACE/ADA: New Consensus Statement on Inpatient Glycemic Control (2015)  Target Ranges:  Prepandial:   less than 140 mg/dL      Peak postprandial:   less than 180 mg/dL (1-2 hours)      Critically ill patients:  140 - 180 mg/dL   Lab Results  Component Value Date   GLUCAP 284 (H) 01/22/2022   HGBA1C 6.7 (H) 05/02/2021    Latest Reference Range & Units 01/21/22 17:56 01/22/22 07:59 01/22/22 11:55  Glucose-Capillary 70 - 99 mg/dL 453 (H) 203 (H) 284 (H)  (H): Data is abnormally high  Diabetes history: DM2 Outpatient Diabetes medications: Tresiba 15 units qd, Glucotrol XL 5 mg qd, Metformin 1 gm bid Current orders for Inpatient glycemic control: Semglee 10 units q hs, Novolog 0-20 units tid, Prednisone 40 mg qd  Inpatient Diabetes Program Recommendations:   Patient did not receive Semglee last hs. Please consider: -Change Semglee to 10 units now and qd -Change diet to carb modified if appropriate -Add Novolog 0-5 units correction for hs  Thank you, Bethena Roys E. Madyson Lukach, RN, MSN, CDE  Diabetes Coordinator Inpatient Glycemic Control Team Team Pager [phone] (8am-5pm) 01/22/2022 1:15 PM

## 2022-01-22 NOTE — Evaluation (Signed)
Physical Therapy Evaluation Patient Details Name: Madeline Martinez MRN: [id number] DOB: 1945-09-18 Today's Date: 01/22/2022  History of Present Illness  Pt is a 76 y.o. female presenting to hospital 01/21/22 with 3 days of productive cough, SOB, and wheezing; also nasal congestion.  Initially requiring BiPap.  Pt admitted with asthma exacerbation, sinus tachycardia, acute renal failure, and anxiety.   PMH includes DM, htn, HLD, asthma, anxiety, depression, OSA, CKD, back sx.  Clinical Impression  Prior to hospital admission, pt was modified independent with functional mobility (occasional use of SPC; use of RW within home; use of electric w/c for community activities); lives with her daughter on main level of home with ramp to enter.  Currently pt is SBA to min assist with bed mobility; CGA with transfers (x2 trials from bed up to RW); and CGA to side step to L along bed a couple feet with RW use.  Limited standing activities (and activities in general) d/t SOB, increased work of breathing, and coughing.  Pt requiring vc's for breathing during session (O2 sats 95% or greater on room air during sessions activities).  Pt would benefit from skilled PT to address noted impairments and functional limitations (see below for any additional details).  Upon hospital discharge, pt would benefit from HHPT and support at home (if pt's respiratory status does not improve, pt may need to be w/c level functional mobility within home).    Recommendations for follow up therapy are one component of a multi-disciplinary discharge planning process, led by the attending physician.  Recommendations may be updated based on patient status, additional functional criteria and insurance authorization.  Follow Up Recommendations Home health PT      Assistance Recommended at Discharge Frequent or constant Supervision/Assistance  Patient can return home with the following  A little help with walking and/or transfers;A little  help with bathing/dressing/bathroom;Assistance with cooking/housework;Assist for transportation;Help with stairs or ramp for entrance    Equipment Recommendations Wheelchair (measurements PT);Wheelchair cushion (measurements PT)  Recommendations for Other Services  OT consult    Functional Status Assessment Patient has had a recent decline in their functional status and demonstrates the ability to make significant improvements in function in a reasonable and predictable amount of time.     Precautions / Restrictions Precautions Precautions: Fall Restrictions Weight Bearing Restrictions: No      Mobility  Bed Mobility Overal bed mobility: Needs Assistance Bed Mobility: Supine to Sit, Sit to Supine     Supine to sit: Supervision, HOB elevated (increased time to perform on own) Sit to supine: Min assist (assist for B LE's)        Transfers Overall transfer level: Needs assistance Equipment used: Rolling walker (2 wheels) Transfers: Sit to/from Stand Sit to Stand: Min guard           General transfer comment: x2 trials standing from bed up to RW; vc's for UE placement    Ambulation/Gait Ambulation/Gait assistance: Min guard Gait Distance (Feet):  (pt sidestepped to L along bed a couple feet) Assistive device: Rolling walker (2 wheels)   Gait velocity: decreased     General Gait Details: increased effort/time to take steps; vc's for breathing  Stairs            Wheelchair Mobility    Modified Rankin (Stroke Patients Only)       Balance Overall balance assessment: Needs assistance Sitting-balance support: No upper extremity supported, Feet supported Sitting balance-Leahy Scale: Good Sitting balance - Comments: steady sitting reaching within BOS  Standing balance support: Bilateral upper extremity supported, Reliant on assistive device for balance Standing balance-Leahy Scale: Fair Standing balance comment: steady static standing with B UE support  on RW                             Pertinent Vitals/Pain Pain Assessment Pain Assessment: Faces Faces Pain Scale: Hurts little more Pain Location: chronic low back pain Pain Descriptors / Indicators: Aching, Sore Pain Intervention(s): Limited activity within patient's tolerance, Monitored during session, Repositioned, RN gave pain meds during session HR stable and WFL throughout treatment session.    Home Living Family/patient expects to be discharged to:: Private residence Living Arrangements: Children (pt's daughter) Available Help at Discharge: Family;Available PRN/intermittently Type of Home: House Home Access: Ramped entrance       Home Layout: Two level;Able to live on main level with bedroom/bathroom Home Equipment: Rolling Walker (2 wheels);Cane - single point;BSC/3in1;Shower seat - built in;Grab bars - toilet;Wheelchair - power Additional Comments: Uses electric w/c for shopping/community mobility    Prior Function Prior Level of Function : Independent/Modified Independent             Mobility Comments: Occasional use of SPC but mostly RW use within home.       Hand Dominance        Extremity/Trunk Assessment   Upper Extremity Assessment Upper Extremity Assessment: Generalized weakness    Lower Extremity Assessment Lower Extremity Assessment: Generalized weakness    Cervical / Trunk Assessment Cervical / Trunk Assessment: Other exceptions Cervical / Trunk Exceptions: forward head/shoulders  Communication   Communication: No difficulties  Cognition Arousal/Alertness: Awake/alert Behavior During Therapy: Anxious (anxious at times) Overall Cognitive Status: Within Functional Limits for tasks assessed                                          General Comments  Nursing cleared pt for participation in physical therapy.  Pt agreeable to PT session.  Pt's daughter present during session.    Exercises     Assessment/Plan     PT Assessment Patient needs continued PT services  PT Problem List Decreased strength;Decreased activity tolerance;Decreased balance;Decreased mobility;Cardiopulmonary status limiting activity       PT Treatment Interventions DME instruction;Gait training;Functional mobility training;Therapeutic activities;Therapeutic exercise;Balance training;Patient/family education    PT Goals (Current goals can be found in the Care Plan section)  Acute Rehab PT Goals Patient Stated Goal: to improve breathing and mobility PT Goal Formulation: With patient Time For Goal Achievement: 02/05/22 Potential to Achieve Goals: Good    Frequency Min 2X/week     Co-evaluation               AM-PAC PT "6 Clicks" Mobility  Outcome Measure Help needed turning from your back to your side while in a flat bed without using bedrails?: None Help needed moving from lying on your back to sitting on the side of a flat bed without using bedrails?: A Little Help needed moving to and from a bed to a chair (including a wheelchair)?: A Little Help needed standing up from a chair using your arms (e.g., wheelchair or bedside chair)?: A Little Help needed to walk in hospital room?: A Lot Help needed climbing 3-5 steps with a railing? : A Lot 6 Click Score: 17    End of Session Equipment Utilized During Treatment:  Gait belt Activity Tolerance: Other (comment) (limited d/t SOB) Patient left: in bed;with call bell/phone within reach;with bed alarm set Nurse Communication: Mobility status;Precautions;Other (comment) (pt's vitals during session) PT Visit Diagnosis: Other abnormalities of gait and mobility (R26.89);Muscle weakness (generalized) (M62.81)    Time: 1342-1410 PT Time Calculation (min) (ACUTE ONLY): 28 min   Charges:   PT Evaluation $PT Eval Low Complexity: 1 Low PT Treatments $Therapeutic Activity: 8-22 mins       Leitha Bleak, PT 01/22/22, 2:33 PM

## 2022-01-22 NOTE — Progress Notes (Signed)
Progress Note    Edita Martinez  OZD:[id number] DOB: 1945/08/17  DOA: 01/21/2022 PCP: [name], MD      Brief Narrative:    Medical records reviewed and are as summarized below:  Madeline Martinez is a 76 y.o. female  with medical history significant of asthma, type 2 diabetes, anemia of chronic disease, CKD stage IIIa, hypertension, OSA, presents to the ED with complaints of shortness of breath.        Assessment/Plan:   Principal Problem:   Asthma exacerbation Active Problems:   Sinus tachycardia   Acute renal failure superimposed on stage 3a chronic kidney disease (HCC)   Anxiety   Type 2 diabetes mellitus with stage 3a chronic kidney disease, with long-term current use of insulin (HCC)   Body mass index is 37.46 kg/m.  (Obesity)   Acute asthma exacerbation: Continue prednisone and bronchodilators.  Robitussin as needed for cough.  COVID and influenza test negative.  Procalcitonin level was normal.  Sinus tachycardia: Improved  CKD stage IIIa: Creatinine is stable.  No AKI.  Type II DM with hyperglycemia: Continue insulin glargine.  Use NovoLog as needed for hyperglycemia.  Anxiety: Ativan as needed.  Chronic low back pain, cervical and lumbar degenerative disc disease: Analgesics as needed for pain.   Diet Order             Diet regular Room service appropriate? Yes; Fluid consistency: Thin  Diet effective now                            Consultants: None  Procedures: None    Medications:    albuterol  2.5 mg Nebulization Q4H   aspirin EC  81 mg Oral Daily   atorvastatin  80 mg Oral Daily   atropine  1 drop Right Eye BID   carvedilol  12.5 mg Oral BID   cholecalciferol  5,000 Units Oral Daily   enoxaparin (LOVENOX) injection  0.5 mg/kg Subcutaneous Q24H   FLUoxetine  40 mg Oral Daily   fluticasone furoate-vilanterol  1 puff Inhalation Daily   guaiFENesin  1,200 mg Oral BID   hydrochlorothiazide  25 mg Oral Daily    insulin aspart  0-20 Units Subcutaneous TID WC   insulin glargine-yfgn  10 Units Subcutaneous QHS   levothyroxine  25 mcg Oral Q0600   loratadine  10 mg Oral Daily   losartan  100 mg Oral Daily   mirabegron ER  50 mg Oral Daily   mirtazapine  15 mg Oral QHS   multivitamin with minerals  1 tablet Oral Daily   ondansetron (ZOFRAN) IV  4 mg Intravenous Once   prednisoLONE acetate  1 drop Right Eye BID   pregabalin  75 mg Oral BID   sodium chloride flush  3 mL Intravenous Q12H   Continuous Infusions:   Anti-infectives (From admission, onward)    None              Family Communication/Anticipated D/C date and plan/Code Status   DVT prophylaxis:      Code Status: Full Code  Family Communication: Plan discussed with [name], daughter, at the bedside Disposition Plan: Plan to discharge home tomorrow   Status is: Observation The patient will require care spanning > 2 midnights and should be moved to inpatient because: Asthma exacerbation       Subjective:   Interval events noted.  She complains of cough, shortness of breath and wheezing.  Objective:  Vitals:   01/22/22 0100 01/22/22 0138 01/22/22 0456 01/22/22 0725  BP: 134/61 127/69 (!) 147/78 139/66  Pulse: 80 82 87 89  Resp: '20 16 16 18  '$ Temp:  98.1 F (36.7 C) 98.3 F (36.8 C) 98.9 F (37.2 C)  TempSrc:  Oral Oral Oral  SpO2:  96% 100% 95%  Weight:   87 kg   Height:   5' (1.524 m)    No data found.   Intake/Output Summary (Last 24 hours) at 01/22/2022 1007 Last data filed at 01/22/2022 0504 Gross per 24 hour  Intake --  Output 0 ml  Net 0 ml   Filed Weights   01/21/22 1601 01/22/22 0456  Weight: 89.4 kg 87 kg    Exam:  GEN: NAD SKIN: Warm and dry EYES: No pallor or icterus ENT: MMM CV: RRR PULM: Decreased air entry bilaterally, bilateral expiratory wheezing ABD: soft, ND, NT, +BS CNS: AAO x 3, non focal EXT: No edema or tenderness        Data Reviewed:   I have  personally reviewed following labs and imaging studies:  Labs: Labs show the following:   Basic Metabolic Panel: Recent Labs  Lab 01/21/22 1013 01/22/22 0700  NA 139 141  K 4.0 3.8  CL 104 107  CO2 22 23  GLUCOSE 382* 192*  BUN 25* 31*  CREATININE 1.14* 1.06*  CALCIUM 9.6 9.0   GFR Estimated Creatinine Clearance: 45 mL/min (A) (by C-G formula based on SCr of 1.06 mg/dL (H)). Liver Function Tests: Recent Labs  Lab 01/21/22 1013  AST 25  ALT 23  ALKPHOS 71  BILITOT 0.8  PROT 7.9  ALBUMIN 3.7   No results for input(s): "LIPASE", "AMYLASE" in the last 168 hours. No results for input(s): "AMMONIA" in the last 168 hours. Coagulation profile No results for input(s): "INR", "PROTIME" in the last 168 hours.  CBC: Recent Labs  Lab 01/21/22 1013 01/22/22 0700  WBC 9.6 12.2*  NEUTROABS 7.2  --   HGB 11.9* 11.3*  HCT 36.5 34.3*  MCV 90.6 90.7  PLT 387 372   Cardiac Enzymes: No results for input(s): "CKTOTAL", "CKMB", "CKMBINDEX", "TROPONINI" in the last 168 hours. BNP (last 3 results) No results for input(s): "PROBNP" in the last 8760 hours. CBG: Recent Labs  Lab 01/21/22 1756 01/22/22 0759  GLUCAP 453* 203*   D-Dimer: No results for input(s): "DDIMER" in the last 72 hours. Hgb A1c: No results for input(s): "HGBA1C" in the last 72 hours. Lipid Profile: No results for input(s): "CHOL", "HDL", "LDLCALC", "TRIG", "CHOLHDL", "LDLDIRECT" in the last 72 hours. Thyroid function studies: No results for input(s): "TSH", "T4TOTAL", "T3FREE", "THYROIDAB" in the last 72 hours.  Invalid input(s): "FREET3" Anemia work up: No results for input(s): "VITAMINB12", "FOLATE", "FERRITIN", "TIBC", "IRON", "RETICCTPCT" in the last 72 hours. Sepsis Labs: Recent Labs  Lab 01/21/22 1013 01/22/22 0700  PROCALCITON <0.10  --   WBC 9.6 12.2*    Microbiology Recent Results (from the past 240 hour(s))  Resp Panel by RT-PCR (Flu A&B, Covid) Anterior Nasal Swab     Status: None    Collection Time: 01/21/22 11:08 AM   Specimen: Anterior Nasal Swab  Result Value Ref Range Status   SARS Coronavirus 2 by RT PCR NEGATIVE NEGATIVE Final    Comment: (NOTE) SARS-CoV-2 target nucleic acids are NOT DETECTED.  The SARS-CoV-2 RNA is generally detectable in upper respiratory specimens during the acute phase of infection. The lowest concentration of SARS-CoV-2 viral copies this assay can  detect is 138 copies/mL. A negative result does not preclude SARS-Cov-2 infection and should not be used as the sole basis for treatment or other patient management decisions. A negative result may occur with  improper specimen collection/handling, submission of specimen other than nasopharyngeal swab, presence of viral mutation(s) within the areas targeted by this assay, and inadequate number of viral copies(<138 copies/mL). A negative result must be combined with clinical observations, patient history, and epidemiological information. The expected result is Negative.  Fact Sheet for Patients:  EntrepreneurPulse.com.au  Fact Sheet for Healthcare Providers:  IncredibleEmployment.be  This test is no t yet approved or cleared by the [place] FDA and  has been authorized for detection and/or diagnosis of SARS-CoV-2 by FDA under an Emergency Use Authorization (EUA). This EUA will remain  in effect (meaning this test can be used) for the duration of the COVID-19 declaration under Section 564(b)(1) of the Act, 21 U.S.C.section 360bbb-3(b)(1), unless the authorization is terminated  or revoked sooner.       Influenza A by PCR NEGATIVE NEGATIVE Final   Influenza B by PCR NEGATIVE NEGATIVE Final    Comment: (NOTE) The Xpert Xpress SARS-CoV-2/FLU/RSV plus assay is intended as an aid in the diagnosis of influenza from Nasopharyngeal swab specimens and should not be used as a sole basis for treatment. Nasal washings and aspirates are unacceptable for  Xpert Xpress SARS-CoV-2/FLU/RSV testing.  Fact Sheet for Patients: EntrepreneurPulse.com.au  Fact Sheet for Healthcare Providers: IncredibleEmployment.be  This test is not yet approved or cleared by the [place] FDA and has been authorized for detection and/or diagnosis of SARS-CoV-2 by FDA under an Emergency Use Authorization (EUA). This EUA will remain in effect (meaning this test can be used) for the duration of the COVID-19 declaration under Section 564(b)(1) of the Act, 21 U.S.C. section 360bbb-3(b)(1), unless the authorization is terminated or revoked.  Performed at [facility], [address] [place], [place] [address]     Procedures and diagnostic studies:  CT CHEST WO CONTRAST  Result Date: 01/21/2022 CLINICAL DATA:  Respiratory illness with shortness of breath and cough; nondiagnostic x-ray EXAM: CT CHEST WITHOUT CONTRAST TECHNIQUE: Multidetector CT imaging of the chest was performed following the standard protocol without IV contrast. RADIATION DOSE REDUCTION: This exam was performed according to the departmental dose-optimization program which includes automated exposure control, adjustment of the mA and/or kV according to patient size and/or use of iterative reconstruction technique. COMPARISON:  Radiographs earlier today; report from CT chest 04/01/2020 FINDINGS: Cardiovascular: Coronary artery and aortic atherosclerotic calcification. Normal heart size. No pericardial effusion. Mediastinum/Nodes: No thoracic adenopathy by size. Unremarkable esophagus. Lungs/Pleura: No focal consolidation, pleural effusion, or pneumothorax. Centrilobular micro nodularity in both lungs suggestive of small airway infection/inflammation. Mild bronchial wall thickening and mucous plugging in the right lower lobe. 1.0 cm nodule in the medial right upper lobe (3/39). 5 mm nodule in the right lower lobe (3/58). Upper Abdomen: No acute abnormality.  Musculoskeletal: No chest wall mass or suspicious bone lesions identified. IMPRESSION: Constellation of findings compatible with small airway infection/inflammation. 1.0 cm nodule in the medial right upper lobe may correspond to the perihilar upper lobe nodule mentioned on the report from 04/01/2020 which measured 6 mm however images are not available at the time of interpretation. Consider one of the following in 3 months for both low-risk and high-risk individuals: (a) repeat chest CT, (b) follow-up PET-CT, or (c) tissue sampling. This recommendation follows the consensus statement: Guidelines for Management of Incidental Pulmonary Nodules  Detected on CT Images: From the Fleischner Society 2017; Radiology 2017; [phone]. Electronically Signed   By: Placido Sou M.D.   On: 01/21/2022 18:23   DG Chest Port 1 View  Result Date: 01/21/2022 CLINICAL DATA:  Shortness of breath and cough. EXAM: PORTABLE CHEST 1 VIEW COMPARISON:  05/17/2021 FINDINGS: 1052 hours. Low volume film. Cardiopericardial silhouette is at upper limits of normal for size. There is pulmonary vascular congestion without overt pulmonary edema. No pleural effusion. The visualized bony structures of the thorax are unremarkable. Telemetry leads overlie the chest. IMPRESSION: Low volume film with pulmonary vascular congestion. Electronically Signed   By: Misty Stanley M.D.   On: 01/21/2022 11:13               LOS: 0 days   Zehava Turski  [facility]   Pager on [email or web address]. If 7PM-7AM, please contact night-coverage at [email or web address]     01/22/2022, 10:07 AM

## 2022-01-23 DIAGNOSIS — J4531 Mild persistent asthma with (acute) exacerbation: Secondary | ICD-10-CM | POA: Diagnosis not present

## 2022-01-23 DIAGNOSIS — E1122 Type 2 diabetes mellitus with diabetic chronic kidney disease: Secondary | ICD-10-CM | POA: Diagnosis not present

## 2022-01-23 DIAGNOSIS — N1831 Chronic kidney disease, stage 3a: Secondary | ICD-10-CM | POA: Diagnosis not present

## 2022-01-23 DIAGNOSIS — Z794 Long term (current) use of insulin: Secondary | ICD-10-CM | POA: Diagnosis not present

## 2022-01-23 LAB — GLUCOSE, CAPILLARY
Glucose-Capillary: 130 mg/dL — ABNORMAL HIGH (ref 70–99)
Glucose-Capillary: 294 mg/dL — ABNORMAL HIGH (ref 70–99)
Glucose-Capillary: 278 mg/dL — ABNORMAL HIGH (ref 70–99)
Glucose-Capillary: 155 mg/dL — ABNORMAL HIGH (ref 70–99)

## 2022-01-23 LAB — HEMOGLOBIN A1C
Hgb A1c MFr Bld: 10.1 % — ABNORMAL HIGH (ref 4.8–5.6)
Mean Plasma Glucose: 243 mg/dL

## 2022-01-23 MED ORDER — INSULIN ASPART 100 UNIT/ML IJ SOLN
4.0000 [IU] | Freq: Three times a day (TID) | INTRAMUSCULAR | Status: DC
  Administered 2022-01-23 – 2022-01-24 (×2): 4 [IU] via SUBCUTANEOUS
  Filled 2022-01-23 (×2): qty 1

## 2022-01-23 MED ORDER — INSULIN GLARGINE-YFGN 100 UNIT/ML ~~LOC~~ SOLN
15.0000 [IU] | Freq: Two times a day (BID) | SUBCUTANEOUS | Status: DC
  Administered 2022-01-23 – 2022-01-24 (×2): 15 [IU] via SUBCUTANEOUS
  Filled 2022-01-23 (×3): qty 0.15

## 2022-01-23 MED ORDER — IPRATROPIUM-ALBUTEROL 0.5-2.5 (3) MG/3ML IN SOLN
3.0000 mL | Freq: Four times a day (QID) | RESPIRATORY_TRACT | Status: DC
  Administered 2022-01-23 – 2022-01-24 (×4): 3 mL via RESPIRATORY_TRACT
  Filled 2022-01-23 (×4): qty 3

## 2022-01-23 NOTE — Inpatient Diabetes Management (Signed)
Inpatient Diabetes Program Recommendations  AACE/ADA: New Consensus Statement on Inpatient Glycemic Control (2015)  Target Ranges:  Prepandial:   less than 140 mg/dL      Peak postprandial:   less than 180 mg/dL (1-2 hours)      Critically ill patients:  140 - 180 mg/dL   Lab Results  Component Value Date   GLUCAP 294 (H) 01/23/2022   HGBA1C 10.1 (H) 01/22/2022    Latest Reference Range & Units 01/22/22 07:59 01/22/22 11:55 01/22/22 16:32 01/22/22 20:40 01/23/22 07:42 01/23/22 11:25  Glucose-Capillary 70 - 99 mg/dL 203 (H) 284 (H) 312 (H) 251 (H) 130 (H) 294 (H)  (H): Data is abnormally high  Inpatient Diabetes Program Recommendations:   Postprandial CBG 294. Please consider while oral diabetes medications on hold: -Add Novolog 4 units tid meal coverage if eats 50% meals  Thank you, Nani Gasser. Jasiah Elsen, RN, MSN, CDE  Diabetes Coordinator Inpatient Glycemic Control Team Team Pager [phone] (8am-5pm) 01/23/2022 1:02 PM

## 2022-01-23 NOTE — TOC Initial Note (Addendum)
Transition of Care [facility]) - Initial/Assessment Note    Patient Details  Name: Madeline Martinez MRN: [id number] Date of Birth: 11-28-1945  Transition of Care [facility]) CM/SW Contact:    Beverly Sessions, RN Phone Number: 01/23/2022, 3:36 PM  Clinical Narrative:                    Admitted for: asthma Admitted from: home with daughter  PCP: Rosario Jacks Current home health/prior home health/DME: electric WC, cane, rw, bsc, shower seat  Therapy recommending home health and manuel WC.  Patient in agreement and would like to use [facility] that she used in March.  Per Judson Roch with [facility] they do not have OT services available. Patient notified and agreeable to a different agency.  She states she does not have a preference of agency.  Referral made Malachy Mood with [facility]  Referral for [facility] made to [facility] with [facility]        Patient Goals and CMS Choice        Expected Discharge Plan and Services                                                Prior Living Arrangements/Services                       Activities of Daily Living Home Assistive Devices/Equipment: None ADL Screening (condition at time of admission) Patient's cognitive ability adequate to safely complete daily activities?: Yes Is the patient deaf or have difficulty hearing?: No Does the patient have difficulty seeing, even when wearing glasses/contacts?: Yes Does the patient have difficulty concentrating, remembering, or making decisions?: No Patient able to express need for assistance with ADLs?: Yes Does the patient have difficulty dressing or bathing?: Yes Independently performs ADLs?: No Does the patient have difficulty walking or climbing stairs?: Yes Weakness of Legs: Both Weakness of Arms/Hands: None  Permission Sought/Granted                  Emotional Assessment              Admission diagnosis:  Bronchitis [J40] Severe persistent asthma with exacerbation [J45.51] Asthma  exacerbation [J45.901] Patient Active Problem List   Diagnosis Date Noted   Asthma exacerbation 01/21/2022   Sinus tachycardia 01/21/2022   Hypoglycemia 05/18/2021   Fall at home, initial encounter 05/17/2021   Type 2 diabetes mellitus with stage 3a chronic kidney disease, with long-term current use of insulin ([place]) 05/17/2021   Mixed diabetic hyperlipidemia associated with type 2 diabetes mellitus ([place]) 05/17/2021   Acute renal failure superimposed on stage 3a chronic kidney disease ([place]) 02/40/9735   Acute metabolic encephalopathy 32/99/2426   Anxiety    Hypothyroidism    Stage 3a chronic kidney disease ([place]) 06/26/2019   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

## 2022-01-23 NOTE — Progress Notes (Signed)
Physical Therapy Treatment Patient Details Name: Madeline Martinez MRN: [id number] DOB: [birth year] Today's Date: 01/23/2022   History of Present Illness Pt is a 76 y.o. female presenting to hospital 01/21/22 with 3 days of productive cough, SOB, and wheezing; also nasal congestion.  Initially requiring BiPap.  Pt admitted with asthma exacerbation, sinus tachycardia, acute renal failure, and anxiety.   PMH includes DM, htn, HLD, asthma, anxiety, depression, OSA, CKD, back sx.    PT Comments    Pt resting in bed upon PT arrival; pt's daughter initially present (just on her way out to do some errands).  During session pt SBA with bed mobility; CGA with transfers; and CGA to ambulate 20 feet x2 (to/from bathroom) with RW use.  Pt demonstrating increased effort/time to take steps but overall steady with RW use; pt SOB at times but pt also appearing anxious; with cueing and encouragement pt's breathing improved (O2 sats 95% or greater on room air during sessions activities).  Will continue to focus on strengthening, balance, and progressive functional mobility during hospitalization.   Recommendations for follow up therapy are one component of a multi-disciplinary discharge planning process, led by the attending physician.  Recommendations may be updated based on patient status, additional functional criteria and insurance authorization.  Follow Up Recommendations  Home health PT     Assistance Recommended at Discharge Frequent or constant Supervision/Assistance  Patient can return home with the following A little help with walking and/or transfers;A little help with bathing/dressing/bathroom;Assistance with cooking/housework;Assist for transportation;Help with stairs or ramp for entrance   Equipment Recommendations  Wheelchair (measurements PT);Wheelchair cushion (measurements PT)    Recommendations for Other Services       Precautions / Restrictions Precautions Precautions:  Fall Restrictions Weight Bearing Restrictions: No     Mobility  Bed Mobility Overal bed mobility: Needs Assistance Bed Mobility: Supine to Sit, Sit to Supine     Supine to sit: Supervision, HOB elevated Sit to supine: Supervision, HOB elevated   General bed mobility comments: increased time to perform on own    Transfers Overall transfer level: Needs assistance Equipment used: Rolling walker (2 wheels) Transfers: Sit to/from Stand Sit to Stand: Min guard           General transfer comment: x1 trial from bed and x1 trial from toilet (using grab bar); increased effort to stand    Ambulation/Gait Ambulation/Gait assistance: Min guard Gait Distance (Feet):  (20 feet x2) Assistive device: Rolling walker (2 wheels)   Gait velocity: decreased     General Gait Details: increased effort/time to take steps; decreased B LE step length/foot clearance; vc's for breathing   Stairs             Wheelchair Mobility    Modified Rankin (Stroke Patients Only)       Balance Overall balance assessment: Needs assistance Sitting-balance support: No upper extremity supported, Feet supported Sitting balance-Leahy Scale: Good Sitting balance - Comments: steady sitting reaching within BOS   Standing balance support: No upper extremity supported Standing balance-Leahy Scale: Fair Standing balance comment: pt a little shaky but able to take both hands off walker and lean to L a little (reach outside BOS to L) to throw away paper towel                            Cognition Arousal/Alertness: Awake/alert Behavior During Therapy: WFL for tasks assessed/performed Overall Cognitive Status: Within Functional Limits for tasks assessed  General Comments: increased time to respond at times        Exercises      General Comments        Pertinent Vitals/Pain Pain Assessment Pain Assessment: Faces Faces Pain Scale:  Hurts little more Pain Location: chronic low back pain Pain Descriptors / Indicators: Aching, Sore Pain Intervention(s): Limited activity within patient's tolerance, Monitored during session, Premedicated before session, Repositioned HR WFL during sessions activities.    Home Living                          Prior Function            PT Goals (current goals can now be found in the care plan section) Acute Rehab PT Goals Patient Stated Goal: to improve breathing and mobility PT Goal Formulation: With patient Time For Goal Achievement: 02/05/22 Potential to Achieve Goals: Good Progress towards PT goals: Progressing toward goals    Frequency    Min 3X/week      PT Plan Frequency needs to be updated (per protocol)    Co-evaluation              AM-PAC PT "6 Clicks" Mobility   Outcome Measure  Help needed turning from your back to your side while in a flat bed without using bedrails?: None Help needed moving from lying on your back to sitting on the side of a flat bed without using bedrails?: A Little Help needed moving to and from a bed to a chair (including a wheelchair)?: A Little Help needed standing up from a chair using your arms (e.g., wheelchair or bedside chair)?: A Little Help needed to walk in hospital room?: A Little Help needed climbing 3-5 steps with a railing? : A Lot 6 Click Score: 18    End of Session Equipment Utilized During Treatment: Gait belt Activity Tolerance: Other (comment) (limited d/t SOB) Patient left: in bed;with call bell/phone within reach;with bed alarm set Nurse Communication: Mobility status;Precautions PT Visit Diagnosis: Other abnormalities of gait and mobility (R26.89);Muscle weakness (generalized) (M62.81)     Time: 0762-2633 PT Time Calculation (min) (ACUTE ONLY): 38 min  Charges:  $Therapeutic Activity: 38-52 mins                     Leitha Bleak, PT 01/23/22, 4:15 PM

## 2022-01-23 NOTE — Progress Notes (Signed)
Progress Note    Madeline Martinez  ZOX:[id number] DOB: 1945-03-30  DOA: 01/21/2022 PCP: [name], MD      Brief Narrative:    Medical records reviewed and are as summarized below:  Madeline Martinez is a 76 y.o. female  with medical history significant of asthma, type 2 diabetes, anemia of chronic disease, CKD stage IIIa, hypertension, OSA, presents to the ED with complaints of shortness of breath.        Assessment/Plan:   Principal Problem:   Asthma exacerbation Active Problems:   Sinus tachycardia   Acute renal failure superimposed on stage 3a chronic kidney disease (HCC)   Anxiety   Type 2 diabetes mellitus with stage 3a chronic kidney disease, with long-term current use of insulin (HCC)   Body mass index is 37.46 kg/m.  (Obesity)   Acute asthma exacerbation: Continue prednisone and bronchodilators.  Robitussin as needed for cough.  Respiratory viral panel, COVID and influenza tests were negative.  Procalcitonin level was normal.  Sinus tachycardia: Improved  CKD stage IIIa: Creatinine is stable.  No AKI.  Type II DM with hyperglycemia: Increase insulin glargine from 10 units to 15 units twice daily.  Add scheduled NovoLog 4 units 3 times daily with meals.  Use NovoLog as needed for hyperglycemia.  Anxiety: Ativan as needed.  Chronic low back pain, cervical and lumbar degenerative disc disease: Analgesics as needed for pain.   Diet Order             Diet Carb Modified Fluid consistency: Thin; Room service appropriate? Yes  Diet effective now                            Consultants: None  Procedures: None    Medications:    aspirin EC  81 mg Oral Daily   atorvastatin  80 mg Oral Daily   atropine  1 drop Right Eye BID   carvedilol  12.5 mg Oral BID   cholecalciferol  5,000 Units Oral Daily   enoxaparin (LOVENOX) injection  0.5 mg/kg Subcutaneous Q24H   FLUoxetine  40 mg Oral Daily   fluticasone furoate-vilanterol  1  puff Inhalation Daily   hydrochlorothiazide  25 mg Oral Daily   insulin aspart  0-20 Units Subcutaneous TID WC   insulin aspart  4 Units Subcutaneous TID WC   insulin glargine-yfgn  15 Units Subcutaneous BID   ipratropium-albuterol  3 mL Nebulization Q6H   levothyroxine  25 mcg Oral Q0600   lidocaine  1 patch Transdermal Q24H   loratadine  10 mg Oral Daily   losartan  100 mg Oral Daily   mirabegron ER  50 mg Oral Daily   mirtazapine  15 mg Oral QHS   multivitamin with minerals  1 tablet Oral Daily   ondansetron (ZOFRAN) IV  4 mg Intravenous Once   prednisoLONE acetate  1 drop Right Eye BID   predniSONE  40 mg Oral Q breakfast   pregabalin  75 mg Oral BID   sodium chloride flush  3 mL Intravenous Q12H   Continuous Infusions:   Anti-infectives (From admission, onward)    None              Family Communication/Anticipated D/C date and plan/Code Status   DVT prophylaxis:      Code Status: Full Code  Family Communication: Plan discussed with [name], daughter, at the bedside Disposition Plan: Plan to discharge home tomorrow   Status is:  Observation The patient will require care spanning > 2 midnights and should be moved to inpatient because: Asthma exacerbation       Subjective:   Interval events noted.  She still complains of cough with mucus in his chest.  She felt short of breath when she walked to the bathroom.  [name], daughter, was at the bedside and said patient was very short of breath after walking a few steps and she is not comfortable taking her home today.  Objective:    Vitals:   01/23/22 0354 01/23/22 0722 01/23/22 0741 01/23/22 1328  BP: 132/78  (!) 127/59   Pulse: 85  76   Resp: 20  18   Temp: 98.8 F (37.1 C)  98.4 F (36.9 C)   TempSrc: Oral  Oral   SpO2: 97% 98% 95% 96%  Weight:      Height:       No data found.   Intake/Output Summary (Last 24 hours) at 01/23/2022 1332 Last data filed at 01/22/2022 1950 Gross per 24 hour   Intake 0 ml  Output --  Net 0 ml   Filed Weights   01/21/22 1601 01/22/22 0456  Weight: 89.4 kg 87 kg    Exam:  GEN: NAD SKIN: Warm and dry EYES: EOMI ENT: MMM CV: RRR PULM: Decreased air entry bilaterally, bilateral expiratory wheezing, no rales ABD: soft, obese, NT, +BS CNS: AAO x 3, non focal EXT: No edema or tenderness          Data Reviewed:   I have personally reviewed following labs and imaging studies:  Labs: Labs show the following:   Basic Metabolic Panel: Recent Labs  Lab 01/21/22 1013 01/22/22 0700  NA 139 141  K 4.0 3.8  CL 104 107  CO2 22 23  GLUCOSE 382* 192*  BUN 25* 31*  CREATININE 1.14* 1.06*  CALCIUM 9.6 9.0   GFR Estimated Creatinine Clearance: 45 mL/min (A) (by C-G formula based on SCr of 1.06 mg/dL (H)). Liver Function Tests: Recent Labs  Lab 01/21/22 1013  AST 25  ALT 23  ALKPHOS 71  BILITOT 0.8  PROT 7.9  ALBUMIN 3.7   No results for input(s): "LIPASE", "AMYLASE" in the last 168 hours. No results for input(s): "AMMONIA" in the last 168 hours. Coagulation profile No results for input(s): "INR", "PROTIME" in the last 168 hours.  CBC: Recent Labs  Lab 01/21/22 1013 01/22/22 0700  WBC 9.6 12.2*  NEUTROABS 7.2  --   HGB 11.9* 11.3*  HCT 36.5 34.3*  MCV 90.6 90.7  PLT 387 372   Cardiac Enzymes: No results for input(s): "CKTOTAL", "CKMB", "CKMBINDEX", "TROPONINI" in the last 168 hours. BNP (last 3 results) No results for input(s): "PROBNP" in the last 8760 hours. CBG: Recent Labs  Lab 01/22/22 1155 01/22/22 1632 01/22/22 2040 01/23/22 0742 01/23/22 1125  GLUCAP 284* 312* 251* 130* 294*   D-Dimer: No results for input(s): "DDIMER" in the last 72 hours. Hgb A1c: Recent Labs    01/22/22 0700  HGBA1C 10.1*   Lipid Profile: No results for input(s): "CHOL", "HDL", "LDLCALC", "TRIG", "CHOLHDL", "LDLDIRECT" in the last 72 hours. Thyroid function studies: No results for input(s): "TSH", "T4TOTAL",  "T3FREE", "THYROIDAB" in the last 72 hours.  Invalid input(s): "FREET3" Anemia work up: No results for input(s): "VITAMINB12", "FOLATE", "FERRITIN", "TIBC", "IRON", "RETICCTPCT" in the last 72 hours. Sepsis Labs: Recent Labs  Lab 01/21/22 1013 01/22/22 0700  PROCALCITON <0.10  --   WBC 9.6 12.2*  Microbiology Recent Results (from the past 240 hour(s))  Resp Panel by RT-PCR (Flu A&B, Covid) Anterior Nasal Swab     Status: None   Collection Time: 01/21/22 11:08 AM   Specimen: Anterior Nasal Swab  Result Value Ref Range Status   SARS Coronavirus 2 by RT PCR NEGATIVE NEGATIVE Final    Comment: (NOTE) SARS-CoV-2 target nucleic acids are NOT DETECTED.  The SARS-CoV-2 RNA is generally detectable in upper respiratory specimens during the acute phase of infection. The lowest concentration of SARS-CoV-2 viral copies this assay can detect is 138 copies/mL. A negative result does not preclude SARS-Cov-2 infection and should not be used as the sole basis for treatment or other patient management decisions. A negative result may occur with  improper specimen collection/handling, submission of specimen other than nasopharyngeal swab, presence of viral mutation(s) within the areas targeted by this assay, and inadequate number of viral copies(<138 copies/mL). A negative result must be combined with clinical observations, patient history, and epidemiological information. The expected result is Negative.  Fact Sheet for Patients:  EntrepreneurPulse.com.au  Fact Sheet for Healthcare Providers:  IncredibleEmployment.be  This test is no t yet approved or cleared by the [place] FDA and  has been authorized for detection and/or diagnosis of SARS-CoV-2 by FDA under an Emergency Use Authorization (EUA). This EUA will remain  in effect (meaning this test can be used) for the duration of the COVID-19 declaration under Section 564(b)(1) of the Act,  21 U.S.C.section 360bbb-3(b)(1), unless the authorization is terminated  or revoked sooner.       Influenza A by PCR NEGATIVE NEGATIVE Final   Influenza B by PCR NEGATIVE NEGATIVE Final    Comment: (NOTE) The Xpert Xpress SARS-CoV-2/FLU/RSV plus assay is intended as an aid in the diagnosis of influenza from Nasopharyngeal swab specimens and should not be used as a sole basis for treatment. Nasal washings and aspirates are unacceptable for Xpert Xpress SARS-CoV-2/FLU/RSV testing.  Fact Sheet for Patients: EntrepreneurPulse.com.au  Fact Sheet for Healthcare Providers: IncredibleEmployment.be  This test is not yet approved or cleared by the [place] FDA and has been authorized for detection and/or diagnosis of SARS-CoV-2 by FDA under an Emergency Use Authorization (EUA). This EUA will remain in effect (meaning this test can be used) for the duration of the COVID-19 declaration under Section 564(b)(1) of the Act, 21 U.S.C. section 360bbb-3(b)(1), unless the authorization is terminated or revoked.  Performed at [facility], [address] [place], [place] [address]   Respiratory (~20 pathogens) panel by PCR     Status: None   Collection Time: 01/21/22 11:10 AM   Specimen: Nasopharyngeal Swab; Respiratory  Result Value Ref Range Status   Adenovirus NOT DETECTED NOT DETECTED Final   Coronavirus 229E NOT DETECTED NOT DETECTED Final    Comment: (NOTE) The Coronavirus on the Respiratory Panel, DOES NOT test for the novel  Coronavirus (2019 nCoV)    Coronavirus HKU1 NOT DETECTED NOT DETECTED Final   Coronavirus NL63 NOT DETECTED NOT DETECTED Final   Coronavirus OC43 NOT DETECTED NOT DETECTED Final   Metapneumovirus NOT DETECTED NOT DETECTED Final   Rhinovirus / Enterovirus NOT DETECTED NOT DETECTED Final   Influenza A NOT DETECTED NOT DETECTED Final   Influenza B NOT DETECTED NOT DETECTED Final   Parainfluenza Virus 1 NOT  DETECTED NOT DETECTED Final   Parainfluenza Virus 2 NOT DETECTED NOT DETECTED Final   Parainfluenza Virus 3 NOT DETECTED NOT DETECTED Final   Parainfluenza Virus 4 NOT DETECTED  NOT DETECTED Final   Respiratory Syncytial Virus NOT DETECTED NOT DETECTED Final   Bordetella pertussis NOT DETECTED NOT DETECTED Final   Bordetella Parapertussis NOT DETECTED NOT DETECTED Final   Chlamydophila pneumoniae NOT DETECTED NOT DETECTED Final   Mycoplasma pneumoniae NOT DETECTED NOT DETECTED Final    Comment: Performed at [facility], [place] [address] [place], [place] [address]    Procedures and diagnostic studies:  CT CHEST WO CONTRAST  Result Date: 01/21/2022 CLINICAL DATA:  Respiratory illness with shortness of breath and cough; nondiagnostic x-ray EXAM: CT CHEST WITHOUT CONTRAST TECHNIQUE: Multidetector CT imaging of the chest was performed following the standard protocol without IV contrast. RADIATION DOSE REDUCTION: This exam was performed according to the departmental dose-optimization program which includes automated exposure control, adjustment of the mA and/or kV according to patient size and/or use of iterative reconstruction technique. COMPARISON:  Radiographs earlier today; report from CT chest 04/01/2020 FINDINGS: Cardiovascular: Coronary artery and aortic atherosclerotic calcification. Normal heart size. No pericardial effusion. Mediastinum/Nodes: No thoracic adenopathy by size. Unremarkable esophagus. Lungs/Pleura: No focal consolidation, pleural effusion, or pneumothorax. Centrilobular micro nodularity in both lungs suggestive of small airway infection/inflammation. Mild bronchial wall thickening and mucous plugging in the right lower lobe. 1.0 cm nodule in the medial right upper lobe (3/39). 5 mm nodule in the right lower lobe (3/58). Upper Abdomen: No acute abnormality. Musculoskeletal: No chest wall mass or suspicious bone lesions identified. IMPRESSION: Constellation of findings  compatible with small airway infection/inflammation. 1.0 cm nodule in the medial right upper lobe may correspond to the perihilar upper lobe nodule mentioned on the report from 04/01/2020 which measured 6 mm however images are not available at the time of interpretation. Consider one of the following in 3 months for both low-risk and high-risk individuals: (a) repeat chest CT, (b) follow-up PET-CT, or (c) tissue sampling. This recommendation follows the consensus statement: Guidelines for Management of Incidental Pulmonary Nodules Detected on CT Images: From the Fleischner Society 2017; Radiology 2017; 284:228-243. Electronically Signed   By: Placido Sou M.D.   On: 01/21/2022 18:23               LOS: 0 days   Loney Domingo  [facility]   Pager on [email or web address]. If 7PM-7AM, please contact night-coverage at [email or web address]     01/23/2022, 1:32 PM

## 2022-01-23 NOTE — Plan of Care (Signed)
  Problem: Education: Goal: Ability to describe self-care measures that may prevent or decrease complications (Diabetes Survival Skills Education) will improve Outcome: Not Progressing Goal: Individualized Educational Video(s) Outcome: Progressing   Problem: Coping: Goal: Ability to adjust to condition or change in health will improve Outcome: Progressing   Problem: Fluid Volume: Goal: Ability to maintain a balanced intake and output will improve Outcome: Progressing   Problem: Health Behavior/Discharge Planning: Goal: Ability to identify and utilize available resources and services will improve Outcome: Progressing Goal: Ability to manage health-related needs will improve Outcome: Progressing   Problem: Metabolic: Goal: Ability to maintain appropriate glucose levels will improve Outcome: Progressing   Problem: Skin Integrity: Goal: Risk for impaired skin integrity will decrease Outcome: Progressing   Problem: Tissue Perfusion: Goal: Adequacy of tissue perfusion will improve Outcome: Progressing   Problem: Education: Goal: Knowledge of General Education information will improve Description: Including pain rating scale, medication(s)/side effects and non-pharmacologic comfort measures Outcome: Progressing   Problem: Clinical Measurements: Goal: Ability to maintain clinical measurements within normal limits will improve Outcome: Progressing Goal: Will remain free from infection Outcome: Progressing Goal: Diagnostic test results will improve Outcome: Progressing Goal: Respiratory complications will improve Outcome: Progressing Goal: Cardiovascular complication will be avoided Outcome: Progressing   Problem: Activity: Goal: Risk for activity intolerance will decrease Outcome: Not Progressing   Problem: Nutrition: Goal: Adequate nutrition will be maintained Outcome: Progressing   Problem: Coping: Goal: Level of anxiety will decrease Outcome: Progressing    Problem: Elimination: Goal: Will not experience complications related to bowel motility Outcome: Progressing Goal: Will not experience complications related to urinary retention Outcome: Progressing   Problem: Pain Managment: Goal: General experience of comfort will improve Outcome: Progressing   Problem: Safety: Goal: Ability to remain free from injury will improve Outcome: Progressing

## 2022-01-23 NOTE — Evaluation (Signed)
Occupational Therapy Evaluation Patient Details Name: Madeline Martinez MRN: [id number] DOB: [birth year] Today's Date: 01/23/2022   History of Present Illness Pt is a 76 y.o. female presenting to hospital 01/21/22 with 3 days of productive cough, SOB, and wheezing; also nasal congestion.  Initially requiring BiPap.  Pt admitted with asthma exacerbation, sinus tachycardia, acute renal failure, and anxiety.   PMH includes DM, htn, HLD, asthma, anxiety, depression, OSA, CKD, back sx.   Clinical Impression   Ms Finnell was seen for OT evaluation this date. Prior to hospital admission, pt was MOD I for mobility and ADLs using 4WW in home and electric scooter for community distances. Pt lives with daughter who assist with IADLs. Pt presents to acute OT demonstrating impaired ADL performance and functional mobility 2/2 decreased activity tolerance and functional strength/balance deficits. Pt currently requires SBA + RW for toilet t/f, pericare sitting, and hand washing standing sink side. MOD I for LB access at bed level. Pt reports very fatigued (did not rate) after toilet t/f and returned to bed, SpO2 93% on RA during mobility. Pt would benefit from skilled OT to address noted impairments and functional limitations (see below for any additional details). Upon hospital discharge, recommend HHOT to maximize pt safety and return to PLOF.    Recommendations for follow up therapy are one component of a multi-disciplinary discharge planning process, led by the attending physician.  Recommendations may be updated based on patient status, additional functional criteria and insurance authorization.   Follow Up Recommendations  Home health OT     Assistance Recommended at Discharge Intermittent Supervision/Assistance  Patient can return home with the following A little help with walking and/or transfers;A little help with bathing/dressing/bathroom;Help with stairs or ramp for entrance    Functional Status  Assessment  Patient has had a recent decline in their functional status and demonstrates the ability to make significant improvements in function in a reasonable and predictable amount of time.  Equipment Recommendations  None recommended by OT    Recommendations for Other Services       Precautions / Restrictions Precautions Precautions: Fall Restrictions Weight Bearing Restrictions: No      Mobility Bed Mobility Overal bed mobility: Needs Assistance Bed Mobility: Sit to Supine       Sit to supine: Supervision        Transfers Overall transfer level: Needs assistance Equipment used: Rolling walker (2 wheels) Transfers: Sit to/from Stand Sit to Stand: Min guard           General transfer comment: chair and commonde height, good eccentric control      Balance Overall balance assessment: Needs assistance Sitting-balance support: No upper extremity supported, Feet supported Sitting balance-Leahy Scale: Good Sitting balance - Comments: steady sitting reaching within BOS   Standing balance support: No upper extremity supported, During functional activity Standing balance-Leahy Scale: Fair                             ADL either performed or assessed with clinical judgement   ADL Overall ADL's : Needs assistance/impaired                                       General ADL Comments: SBA + RW for toilet t/f, pericare sitting, and hand washing standing sink side. MOD I for LB access at bed level  Pertinent Vitals/Pain Pain Assessment Pain Assessment: No/denies pain     Hand Dominance     Extremity/Trunk Assessment Upper Extremity Assessment Upper Extremity Assessment: Generalized weakness   Lower Extremity Assessment Lower Extremity Assessment: Generalized weakness       Communication Communication Communication: No difficulties   Cognition Arousal/Alertness: Awake/alert Behavior During Therapy: WFL for tasks  assessed/performed Overall Cognitive Status: Within Functional Limits for tasks assessed                                        Home Living Family/patient expects to be discharged to:: Private residence Living Arrangements: Children (daughter) Available Help at Discharge: Family;Available PRN/intermittently Type of Home: House Home Access: Ramped entrance     Home Layout: Two level;Able to live on main level with bedroom/bathroom Alternate Level Stairs-Number of Steps: flight   Bathroom Shower/Tub: Hospital doctor Toilet: Handicapped height     Home Equipment: Conservation officer, nature (2 wheels);Cane - single point;BSC/3in1;Shower seat - built in;Grab bars - toilet;Wheelchair - power   Additional Comments: Uses electric w/c for shopping/community mobility      Prior Functioning/Environment Prior Level of Function : Independent/Modified Independent             Mobility Comments: Occasional use of SPC but mostly RW use within home. ADLs Comments: daughter does IADLs        OT Problem List: Decreased strength;Decreased range of motion;Decreased activity tolerance;Impaired balance (sitting and/or standing)      OT Treatment/Interventions: Self-care/ADL training;Therapeutic exercise;Energy conservation;DME and/or AE instruction;Therapeutic activities;Patient/family education;Balance training    OT Goals(Current goals can be found in the care plan section) Acute Rehab OT Goals Patient Stated Goal: to get stronger OT Goal Formulation: With patient/family Time For Goal Achievement: 02/06/22 Potential to Achieve Goals: Good ADL Goals Pt Will Perform Grooming: Independently;standing Pt Will Perform Lower Body Dressing: with modified independence;sit to/from stand Pt Will Transfer to Toilet: with modified independence;ambulating;regular height toilet  OT Frequency: Min 3X/week    Co-evaluation              AM-PAC OT "6 Clicks" Daily Activity      Outcome Measure Help from another person eating meals?: None Help from another person taking care of personal grooming?: A Little Help from another person toileting, which includes using toliet, bedpan, or urinal?: A Little Help from another person bathing (including washing, rinsing, drying)?: A Little Help from another person to put on and taking off regular upper body clothing?: None Help from another person to put on and taking off regular lower body clothing?: A Little 6 Click Score: 20   End of Session    Activity Tolerance: Patient tolerated treatment well Patient left: in bed;with call bell/phone within reach;with family/visitor present  OT Visit Diagnosis: Other abnormalities of gait and mobility (R26.89);Muscle weakness (generalized) (M62.81)                Time: 1015-1040 OT Time Calculation (min): 25 min Charges:  OT General Charges $OT Visit: 1 Visit OT Evaluation $OT Eval Low Complexity: 1 Low OT Treatments $Self Care/Home Management : 8-22 mins  Dessie Coma, M.S. OTR/L  01/23/22, 10:47 AM  ascom [phone]

## 2022-01-24 DIAGNOSIS — J4531 Mild persistent asthma with (acute) exacerbation: Secondary | ICD-10-CM | POA: Diagnosis not present

## 2022-01-24 DIAGNOSIS — R000 Tachycardia, unspecified: Secondary | ICD-10-CM | POA: Diagnosis not present

## 2022-01-24 DIAGNOSIS — F419 Anxiety disorder, unspecified: Secondary | ICD-10-CM | POA: Diagnosis not present

## 2022-01-24 DIAGNOSIS — N179 Acute kidney failure, unspecified: Secondary | ICD-10-CM | POA: Diagnosis not present

## 2022-01-24 LAB — GLUCOSE, CAPILLARY: Glucose-Capillary: 155 mg/dL — ABNORMAL HIGH (ref 70–99)

## 2022-01-24 MED ORDER — PREDNISONE 20 MG PO TABS: 40.0000 mg | ORAL_TABLET | Freq: Every day | ORAL | 0 refills | Status: AC

## 2022-01-24 MED ORDER — ALBUTEROL SULFATE (2.5 MG/3ML) 0.083% IN NEBU: 2.5000 mg | INHALATION_SOLUTION | RESPIRATORY_TRACT | 0 refills | Status: AC | PRN

## 2022-01-24 MED ORDER — SENNOSIDES-DOCUSATE SODIUM 8.6-50 MG PO TABS
2.0000 | ORAL_TABLET | Freq: Once | ORAL | Status: AC
  Administered 2022-01-24: 2 via ORAL
  Filled 2022-01-24: qty 2

## 2022-01-24 MED ORDER — ALBUTEROL SULFATE HFA 108 (90 BASE) MCG/ACT IN AERS: 2.0000 | INHALATION_SPRAY | RESPIRATORY_TRACT | 0 refills | Status: AC | PRN

## 2022-01-24 MED ORDER — BISACODYL 10 MG RE SUPP: 10.0000 mg | Freq: Every day | RECTAL | Status: DC | PRN

## 2022-01-24 MED ORDER — PROMETHAZINE-DM 6.25-15 MG/5ML PO SYRP: 5.0000 mL | ORAL_SOLUTION | Freq: Four times a day (QID) | ORAL | 0 refills | Status: DC | PRN

## 2022-01-24 MED ORDER — POLYETHYLENE GLYCOL 3350 17 G PO PACK
17.0000 g | PACK | Freq: Two times a day (BID) | ORAL | Status: DC
  Administered 2022-01-24: 17 g via ORAL
  Filled 2022-01-24: qty 1

## 2022-01-24 NOTE — Discharge Summary (Signed)
Physician Discharge Summary   Patient: Madeline Martinez MRN: [id number]  DOB: 27-Dec-1945   Admit:     Date of Admission: 01/21/2022 Admitted from: home   Discharge: Date of discharge: 01/24/22 Disposition: Home Condition at discharge: good  CODE STATUS: FULL CODE      Discharge Physician: Emeterio Reeve, DO [facility]     PCP: [name], MD  Recommendations for Outpatient Follow-up:  Follow up with PCP [name], MD in 2-4 weeks Please obtain labs/tests: CBC, BMP in 2-4 weeks  Please follow up on the following pending results: none    Discharge Instructions     Diet - low sodium heart healthy   Complete by: As directed    Increase activity slowly   Complete by: As directed          Discharge Diagnoses: Principal Problem:   Asthma exacerbation Active Problems:   Sinus tachycardia   Acute renal failure superimposed on stage 3a chronic kidney disease ([place])   Anxiety   Type 2 diabetes mellitus with stage 3a chronic kidney disease, with long-term current use of insulin [facility])       Hospital Course: Madeline Martinez is a 76 y.o. female  with medical history significant of asthma, type 2 diabetes, anemia of chronic disease, CKD stage IIIa, hypertension, OSA, presents to the ED with complaints of shortness of breath. Treated for asthma exacerbation.  Respiratory viral panel, COVID and influenza tests were negative.  Procalcitonin level was normal.       Acute asthma exacerbation:  Continue prednisone and bronchodilators.   Robitussin as needed for cough.   Sinus tachycardia: Improved   CKD stage IIIa: Creatinine is stable.  No AKI.   Type II DM with hyperglycemia: Increase insulin glargine from 10 units to 15 units twice daily.    Anxiety: Ativan as needed.   Chronic low back pain, cervical and lumbar degenerative disc disease: Analgesics as needed for pain.      Discharge Instructions  Allergies as of 01/24/2022   No  Known Allergies      Medication List     STOP taking these medications    Linzess 290 MCG Caps capsule Generic drug: linaclotide       TAKE these medications    Advair HFA 115-21 MCG/ACT inhaler Generic drug: fluticasone-salmeterol Inhale 2 puffs into the lungs 2 (two) times daily.   albuterol 108 (90 Base) MCG/ACT inhaler Commonly known as: VENTOLIN HFA Inhale 2 puffs into the lungs every 4 (four) hours as needed for wheezing or shortness of breath. What changed: Another medication with the same name was added. Make sure you understand how and when to take each.   albuterol (2.5 MG/3ML) 0.083% nebulizer solution Commonly known as: PROVENTIL Take 3 mLs (2.5 mg total) by nebulization every 4 (four) hours as needed for wheezing or shortness of breath (please include nebulizer machine, hoses, and mask if needed.). What changed: You were already taking a medication with the same name, and this prescription was added. Make sure you understand how and when to take each.   allopurinol 100 MG tablet Commonly known as: ZYLOPRIM Take 100 mg by mouth daily with lunch.   ascorbic acid 1000 MG tablet Commonly known as: VITAMIN C Take 1,000 mg by mouth 3 (three) times daily.   aspirin EC 81 MG tablet Take 81 mg by mouth daily.   atorvastatin 80 MG tablet Commonly known as: LIPITOR Take 80 mg by mouth daily.   atropine 1 %  ophthalmic solution Place 1 drop into the right eye 2 (two) times daily.   Biotin 5000 MCG Tabs Take 5,000 mcg by mouth daily. HAIR/SKIN/NAILS   carvedilol 12.5 MG tablet Commonly known as: COREG Take 12.5 mg by mouth 2 (two) times daily.   cetirizine 10 MG tablet Commonly known as: ZYRTEC Take 10 mg by mouth daily.   Dupixent 300 MG/2ML Sopn Generic drug: Dupilumab Inject 300 mg into the skin every 14 (fourteen) days.   ferrous sulfate 325 (65 FE) MG tablet Take 325 mg by mouth daily.   FLUoxetine 40 MG capsule Commonly known as: PROZAC Take  40 mg by mouth daily.   glipiZIDE 5 MG 24 hr tablet Commonly known as: GLUCOTROL XL Take 1 tablet (5 mg total) by mouth daily.   hydrochlorothiazide 25 MG tablet Commonly known as: HYDRODIURIL Take 25 mg by mouth daily.   HYDROcodone-acetaminophen 10-325 MG tablet Commonly known as: NORCO Take 1 tablet by mouth every 8 (eight) hours as needed.   levothyroxine 25 MCG tablet Commonly known as: SYNTHROID Take 25 mcg by mouth daily.   LORazepam 1 MG tablet Commonly known as: ATIVAN Take 1 tablet (1 mg total) by mouth 2 (two) times daily as needed for anxiety.   losartan 100 MG tablet Commonly known as: COZAAR Take 100 mg by mouth daily.   metFORMIN 1000 MG tablet Commonly known as: GLUCOPHAGE Take 1,000 mg by mouth 2 (two) times daily.   metoCLOPramide 10 MG tablet Commonly known as: REGLAN Take 1 tablet (10 mg total) by mouth every 8 (eight) hours as needed for up to 14 days for nausea.   mirabegron ER 50 MG Tb24 tablet Commonly known as: MYRBETRIQ Take 1 tablet (50 mg total) by mouth daily.   mirtazapine 15 MG tablet Commonly known as: REMERON Take 15 mg by mouth at bedtime.   omeprazole 20 MG capsule Commonly known as: PRILOSEC Take 20 mg by mouth daily as needed.   prednisoLONE acetate 1 % ophthalmic suspension Commonly known as: PRED FORTE Place 1 drop into the right eye 2 (two) times daily.   predniSONE 20 MG tablet Commonly known as: DELTASONE Take 2 tablets (40 mg total) by mouth daily with breakfast for 2 days. Start taking on: January 25, 2022   pregabalin 75 MG capsule Commonly known as: LYRICA Take 1 capsule (75 mg total) by mouth 2 (two) times daily.   PROBIOTIC PO Take 1 tablet by mouth daily.   promethazine-dextromethorphan 6.25-15 MG/5ML syrup Commonly known as: PROMETHAZINE-DM Take 5 mLs by mouth 4 (four) times daily as needed for cough. What changed: when to take this   [place] FlexTouch 100 UNIT/ML FlexTouch Pen Generic drug: insulin  degludec Inject 15 Units into the skin daily.   Vitamin D-3 125 MCG (5000 UT) Tabs Take 5,000 Units by mouth daily.   vitamin E 45 MG (100 UNITS) capsule Take by mouth.               Durable Medical Equipment  (From admission, onward)           Start     Ordered   01/23/22 1526  For home use only DME lightweight manual wheelchair with seat cushion  Once       Comments: Patient suffers from Chronic low back pain, cervical and lumbar degenerative disc disease which impairs their ability to perform daily activities like toileting in the home.  A walker will not resolve  issue with performing activities of daily living. A  wheelchair will allow patient to safely perform daily activities. Patient is not able to propel themselves in the home using a standard weight wheelchair due to general weakness. Patient can self propel in the lightweight wheelchair. Length of need Lifetime. Accessories: elevating leg rests (ELRs), wheel locks, extensions and anti-tippers.   01/23/22 1525              No Known Allergies   Subjective: pt breathing better rtoday, still some wheezing but not too bad    Discharge Exam: BP 134/64   Pulse 98   Temp 98.1 F (36.7 C)   Resp 18   Ht 5' (1.524 m)   Wt 87 kg   SpO2 95%   BMI 37.46 kg/m  General: Pt is alert, awake, not in acute distress Cardiovascular: RRR, S1/S2 +, no rubs, no gallops Respiratory: +scattered expiratory wheezing, no rhonchi Abdominal: Soft, NT, ND, bowel sounds + Extremities: no edema, no cyanosis     The results of significant diagnostics from this hospitalization (including imaging, microbiology, ancillary and laboratory) are listed below for reference.     Microbiology: Recent Results (from the past 240 hour(s))  Resp Panel by RT-PCR (Flu A&B, Covid) Anterior Nasal Swab     Status: None   Collection Time: 01/21/22 11:08 AM   Specimen: Anterior Nasal Swab  Result Value Ref Range Status   SARS Coronavirus  2 by RT PCR NEGATIVE NEGATIVE Final    Comment: (NOTE) SARS-CoV-2 target nucleic acids are NOT DETECTED.  The SARS-CoV-2 RNA is generally detectable in upper respiratory specimens during the acute phase of infection. The lowest concentration of SARS-CoV-2 viral copies this assay can detect is 138 copies/mL. A negative result does not preclude SARS-Cov-2 infection and should not be used as the sole basis for treatment or other patient management decisions. A negative result may occur with  improper specimen collection/handling, submission of specimen other than nasopharyngeal swab, presence of viral mutation(s) within the areas targeted by this assay, and inadequate number of viral copies(<138 copies/mL). A negative result must be combined with clinical observations, patient history, and epidemiological information. The expected result is Negative.  Fact Sheet for Patients:  EntrepreneurPulse.com.au  Fact Sheet for Healthcare Providers:  IncredibleEmployment.be  This test is no t yet approved or cleared by the [place] FDA and  has been authorized for detection and/or diagnosis of SARS-CoV-2 by FDA under an Emergency Use Authorization (EUA). This EUA will remain  in effect (meaning this test can be used) for the duration of the COVID-19 declaration under Section 564(b)(1) of the Act, 21 U.S.C.section 360bbb-3(b)(1), unless the authorization is terminated  or revoked sooner.       Influenza A by PCR NEGATIVE NEGATIVE Final   Influenza B by PCR NEGATIVE NEGATIVE Final    Comment: (NOTE) The Xpert Xpress SARS-CoV-2/FLU/RSV plus assay is intended as an aid in the diagnosis of influenza from Nasopharyngeal swab specimens and should not be used as a sole basis for treatment. Nasal washings and aspirates are unacceptable for Xpert Xpress SARS-CoV-2/FLU/RSV testing.  Fact Sheet for Patients: EntrepreneurPulse.com.au  Fact  Sheet for Healthcare Providers: IncredibleEmployment.be  This test is not yet approved or cleared by the [place] FDA and has been authorized for detection and/or diagnosis of SARS-CoV-2 by FDA under an Emergency Use Authorization (EUA). This EUA will remain in effect (meaning this test can be used) for the duration of the COVID-19 declaration under Section 564(b)(1) of the Act, 21 U.S.C. section 360bbb-3(b)(1), unless the authorization  is terminated or revoked.  Performed at [facility], [address] [place], [place] [address]   Respiratory (~20 pathogens) panel by PCR     Status: None   Collection Time: 01/21/22 11:10 AM   Specimen: Nasopharyngeal Swab; Respiratory  Result Value Ref Range Status   Adenovirus NOT DETECTED NOT DETECTED Final   Coronavirus 229E NOT DETECTED NOT DETECTED Final    Comment: (NOTE) The Coronavirus on the Respiratory Panel, DOES NOT test for the novel  Coronavirus (2019 nCoV)    Coronavirus HKU1 NOT DETECTED NOT DETECTED Final   Coronavirus NL63 NOT DETECTED NOT DETECTED Final   Coronavirus OC43 NOT DETECTED NOT DETECTED Final   Metapneumovirus NOT DETECTED NOT DETECTED Final   Rhinovirus / Enterovirus NOT DETECTED NOT DETECTED Final   Influenza A NOT DETECTED NOT DETECTED Final   Influenza B NOT DETECTED NOT DETECTED Final   Parainfluenza Virus 1 NOT DETECTED NOT DETECTED Final   Parainfluenza Virus 2 NOT DETECTED NOT DETECTED Final   Parainfluenza Virus 3 NOT DETECTED NOT DETECTED Final   Parainfluenza Virus 4 NOT DETECTED NOT DETECTED Final   Respiratory Syncytial Virus NOT DETECTED NOT DETECTED Final   Bordetella pertussis NOT DETECTED NOT DETECTED Final   Bordetella Parapertussis NOT DETECTED NOT DETECTED Final   Chlamydophila pneumoniae NOT DETECTED NOT DETECTED Final   Mycoplasma pneumoniae NOT DETECTED NOT DETECTED Final    Comment: Performed at [facility], [place]. [address], [place], [place]  [address]     Labs: BNP (last 3 results) Recent Labs    01/21/22 1013  BNP 585.2*   Basic Metabolic Panel: Recent Labs  Lab 01/21/22 1013 01/22/22 0700  NA 139 141  K 4.0 3.8  CL 104 107  CO2 22 23  GLUCOSE 382* 192*  BUN 25* 31*  CREATININE 1.14* 1.06*  CALCIUM 9.6 9.0   Liver Function Tests: Recent Labs  Lab 01/21/22 1013  AST 25  ALT 23  ALKPHOS 71  BILITOT 0.8  PROT 7.9  ALBUMIN 3.7   No results for input(s): "LIPASE", "AMYLASE" in the last 168 hours. No results for input(s): "AMMONIA" in the last 168 hours. CBC: Recent Labs  Lab 01/21/22 1013 01/22/22 0700  WBC 9.6 12.2*  NEUTROABS 7.2  --   HGB 11.9* 11.3*  HCT 36.5 34.3*  MCV 90.6 90.7  PLT 387 372   Cardiac Enzymes: No results for input(s): "CKTOTAL", "CKMB", "CKMBINDEX", "TROPONINI" in the last 168 hours. BNP: Invalid input(s): "POCBNP" CBG: Recent Labs  Lab 01/23/22 0742 01/23/22 1125 01/23/22 1607 01/23/22 2140 01/24/22 0831  GLUCAP 130* 294* 278* 155* 155*   D-Dimer No results for input(s): "DDIMER" in the last 72 hours. Hgb A1c Recent Labs    01/22/22 0700  HGBA1C 10.1*   Lipid Profile No results for input(s): "CHOL", "HDL", "LDLCALC", "TRIG", "CHOLHDL", "LDLDIRECT" in the last 72 hours. Thyroid function studies No results for input(s): "TSH", "T4TOTAL", "T3FREE", "THYROIDAB" in the last 72 hours.  Invalid input(s): "FREET3" Anemia work up No results for input(s): "VITAMINB12", "FOLATE", "FERRITIN", "TIBC", "IRON", "RETICCTPCT" in the last 72 hours. Urinalysis    Component Value Date/Time   COLORURINE YELLOW (A) 05/18/2021 0351   APPEARANCEUR HAZY (A) 05/18/2021 0351   APPEARANCEUR Hazy (A) 08/04/2018 1403   LABSPEC 1.011 05/18/2021 0351   PHURINE 5.0 05/18/2021 0351   GLUCOSEU NEGATIVE 05/18/2021 0351   HGBUR NEGATIVE 05/18/2021 0351   BILIRUBINUR NEGATIVE 05/18/2021 0351   BILIRUBINUR Negative 08/04/2018 1403   KETONESUR NEGATIVE  05/18/2021 0351   PROTEINUR  NEGATIVE 05/18/2021 0351   NITRITE NEGATIVE 05/18/2021 0351   LEUKOCYTESUR MODERATE (A) 05/18/2021 0351   Sepsis Labs Recent Labs  Lab 01/21/22 1013 01/22/22 0700  WBC 9.6 12.2*   Microbiology Recent Results (from the past 240 hour(s))  Resp Panel by RT-PCR (Flu A&B, Covid) Anterior Nasal Swab     Status: None   Collection Time: 01/21/22 11:08 AM   Specimen: Anterior Nasal Swab  Result Value Ref Range Status   SARS Coronavirus 2 by RT PCR NEGATIVE NEGATIVE Final    Comment: (NOTE) SARS-CoV-2 target nucleic acids are NOT DETECTED.  The SARS-CoV-2 RNA is generally detectable in upper respiratory specimens during the acute phase of infection. The lowest concentration of SARS-CoV-2 viral copies this assay can detect is 138 copies/mL. A negative result does not preclude SARS-Cov-2 infection and should not be used as the sole basis for treatment or other patient management decisions. A negative result may occur with  improper specimen collection/handling, submission of specimen other than nasopharyngeal swab, presence of viral mutation(s) within the areas targeted by this assay, and inadequate number of viral copies(<138 copies/mL). A negative result must be combined with clinical observations, patient history, and epidemiological information. The expected result is Negative.  Fact Sheet for Patients:  EntrepreneurPulse.com.au  Fact Sheet for Healthcare Providers:  IncredibleEmployment.be  This test is no t yet approved or cleared by the [place] FDA and  has been authorized for detection and/or diagnosis of SARS-CoV-2 by FDA under an Emergency Use Authorization (EUA). This EUA will remain  in effect (meaning this test can be used) for the duration of the COVID-19 declaration under Section 564(b)(1) of the Act, 21 U.S.C.section 360bbb-3(b)(1), unless the authorization is terminated  or revoked sooner.       Influenza A by PCR  NEGATIVE NEGATIVE Final   Influenza B by PCR NEGATIVE NEGATIVE Final    Comment: (NOTE) The Xpert Xpress SARS-CoV-2/FLU/RSV plus assay is intended as an aid in the diagnosis of influenza from Nasopharyngeal swab specimens and should not be used as a sole basis for treatment. Nasal washings and aspirates are unacceptable for Xpert Xpress SARS-CoV-2/FLU/RSV testing.  Fact Sheet for Patients: EntrepreneurPulse.com.au  Fact Sheet for Healthcare Providers: IncredibleEmployment.be  This test is not yet approved or cleared by the [place] FDA and has been authorized for detection and/or diagnosis of SARS-CoV-2 by FDA under an Emergency Use Authorization (EUA). This EUA will remain in effect (meaning this test can be used) for the duration of the COVID-19 declaration under Section 564(b)(1) of the Act, 21 U.S.C. section 360bbb-3(b)(1), unless the authorization is terminated or revoked.  Performed at [facility], [place], [place] [address]   Respiratory (~20 pathogens) panel by PCR     Status: None   Collection Time: 01/21/22 11:10 AM   Specimen: Nasopharyngeal Swab; Respiratory  Result Value Ref Range Status   Adenovirus NOT DETECTED NOT DETECTED Final   Coronavirus 229E NOT DETECTED NOT DETECTED Final    Comment: (NOTE) The Coronavirus on the Respiratory Panel, DOES NOT test for the novel  Coronavirus (2019 nCoV)    Coronavirus HKU1 NOT DETECTED NOT DETECTED Final   Coronavirus NL63 NOT DETECTED NOT DETECTED Final   Coronavirus OC43 NOT DETECTED NOT DETECTED Final   Metapneumovirus NOT DETECTED NOT DETECTED Final   Rhinovirus / Enterovirus NOT DETECTED NOT DETECTED Final   Influenza A NOT DETECTED NOT DETECTED Final   Influenza B NOT DETECTED NOT  DETECTED Final   Parainfluenza Virus 1 NOT DETECTED NOT DETECTED Final   Parainfluenza Virus 2 NOT DETECTED NOT DETECTED Final   Parainfluenza Virus 3 NOT DETECTED NOT  DETECTED Final   Parainfluenza Virus 4 NOT DETECTED NOT DETECTED Final   Respiratory Syncytial Virus NOT DETECTED NOT DETECTED Final   Bordetella pertussis NOT DETECTED NOT DETECTED Final   Bordetella Parapertussis NOT DETECTED NOT DETECTED Final   Chlamydophila pneumoniae NOT DETECTED NOT DETECTED Final   Mycoplasma pneumoniae NOT DETECTED NOT DETECTED Final    Comment: Performed at [facility], [place] [address], [place], [place] [address]   Imaging CT CHEST WO CONTRAST  Result Date: 01/21/2022 CLINICAL DATA:  Respiratory illness with shortness of breath and cough; nondiagnostic x-ray EXAM: CT CHEST WITHOUT CONTRAST TECHNIQUE: Multidetector CT imaging of the chest was performed following the standard protocol without IV contrast. RADIATION DOSE REDUCTION: This exam was performed according to the departmental dose-optimization program which includes automated exposure control, adjustment of the mA and/or kV according to patient size and/or use of iterative reconstruction technique. COMPARISON:  Radiographs earlier today; report from CT chest 04/01/2020 FINDINGS: Cardiovascular: Coronary artery and aortic atherosclerotic calcification. Normal heart size. No pericardial effusion. Mediastinum/Nodes: No thoracic adenopathy by size. Unremarkable esophagus. Lungs/Pleura: No focal consolidation, pleural effusion, or pneumothorax. Centrilobular micro nodularity in both lungs suggestive of small airway infection/inflammation. Mild bronchial wall thickening and mucous plugging in the right lower lobe. 1.0 cm nodule in the medial right upper lobe (3/39). 5 mm nodule in the right lower lobe (3/58). Upper Abdomen: No acute abnormality. Musculoskeletal: No chest wall mass or suspicious bone lesions identified. IMPRESSION: Constellation of findings compatible with small airway infection/inflammation. 1.0 cm nodule in the medial right upper lobe may correspond to the perihilar upper lobe nodule mentioned on the  report from 04/01/2020 which measured 6 mm however images are not available at the time of interpretation. Consider one of the following in 3 months for both low-risk and high-risk individuals: (a) repeat chest CT, (b) follow-up PET-CT, or (c) tissue sampling. This recommendation follows the consensus statement: Guidelines for Management of Incidental Pulmonary Nodules Detected on CT Images: From the Fleischner Society 2017; Radiology 2017; 284:228-243. Electronically Signed   By: Placido Sou M.D.   On: 01/21/2022 18:23   DG Chest Port 1 View  Result Date: 01/21/2022 CLINICAL DATA:  Shortness of breath and cough. EXAM: PORTABLE CHEST 1 VIEW COMPARISON:  05/17/2021 FINDINGS: 1052 hours. Low volume film. Cardiopericardial silhouette is at upper limits of normal for size. There is pulmonary vascular congestion without overt pulmonary edema. No pleural effusion. The visualized bony structures of the thorax are unremarkable. Telemetry leads overlie the chest. IMPRESSION: Low volume film with pulmonary vascular congestion. Electronically Signed   By: Misty Stanley M.D.   On: 01/21/2022 11:13      Time coordinating discharge: over 30 minutes  SIGNED:  Emeterio Reeve DO [facility]

## 2022-01-24 NOTE — TOC Progression Note (Signed)
Transition of Care [facility]) - Progression Note    Patient Details  Name: Madeline Martinez MRN: [id number] Date of Birth: 12-20-1945  Transition of Care [facility]) CM/SW Contact  Beverly Sessions, RN Phone Number: 01/24/2022, 11:22 AM  Clinical Narrative:     Per Cyril Mourning with [employer] [facility] has been delivered        Expected Discharge Plan and Services                                                 Social Determinants of Health (SDOH) Interventions    Readmission Risk Interventions    05/18/2021    3:56 PM  Readmission Risk Prevention Plan  Transportation Screening Complete  PCP or Specialist Appt within 5-7 Days Complete  Home Care Screening Complete  Medication Review (RN CM) Complete

## 2022-01-24 NOTE — Progress Notes (Signed)
Occupational Therapy Treatment Patient Details Name: Madeline Martinez MRN: [id number] DOB: 05-18-1945 Today's Date: 01/24/2022   History of present illness Pt is a 76 y.o. female presenting to hospital 01/21/22 with 3 days of productive cough, SOB, and wheezing; also nasal congestion.  Initially requiring BiPap.  Pt admitted with asthma exacerbation, sinus tachycardia, acute renal failure, and anxiety.   PMH includes DM, htn, HLD, asthma, anxiety, depression, OSA, CKD, back sx.   OT comments  Ms Racicot was seen for OT treatment on this date. Upon arrival to room pt reclined in bed, agreeable to tx. Pt requires SBA + RW for toilet t/f, pericare sitting, and hand washing standing sink side. MIN A don/doff gown in standing. New w/c delivered to room, reviewed safety features and technique for in home use. Pt making good progress toward goals, will continue to follow POC. Discharge recommendation remains appropriate.     Recommendations for follow up therapy are one component of a multi-disciplinary discharge planning process, led by the attending physician.  Recommendations may be updated based on patient status, additional functional criteria and insurance authorization.    Follow Up Recommendations  Home health OT     Assistance Recommended at Discharge Intermittent Supervision/Assistance  Patient can return home with the following  A little help with walking and/or transfers;A little help with bathing/dressing/bathroom;Help with stairs or ramp for entrance   Equipment Recommendations  None recommended by OT    Recommendations for Other Services      Precautions / Restrictions Precautions Precautions: Fall Restrictions Weight Bearing Restrictions: No       Mobility Bed Mobility Overal bed mobility: Needs Assistance Bed Mobility: Supine to Sit, Sit to Supine     Supine to sit: Supervision, HOB elevated Sit to supine: Supervision, HOB elevated        Transfers Overall  transfer level: Needs assistance Equipment used: Rolling walker (2 wheels) Transfers: Sit to/from Stand Sit to Stand: Supervision                 Balance Overall balance assessment: Needs assistance Sitting-balance support: No upper extremity supported, Feet supported Sitting balance-Leahy Scale: Good     Standing balance support: No upper extremity supported Standing balance-Leahy Scale: Good                             ADL either performed or assessed with clinical judgement   ADL Overall ADL's : Needs assistance/impaired                                       General ADL Comments: SBA + RW for toilet t/f, pericare sitting, and hand washing standing sink side.      Cognition Arousal/Alertness: Awake/alert Behavior During Therapy: WFL for tasks assessed/performed Overall Cognitive Status: Within Functional Limits for tasks assessed                                           Pertinent Vitals/ Pain       Pain Assessment Pain Assessment: No/denies pain   Frequency  Min 3X/week        Progress Toward Goals  OT Goals(current goals can now be found in the care plan section)  Progress towards OT goals: Progressing toward goals  Acute Rehab OT Goals Patient Stated Goal: to get stronger OT Goal Formulation: With patient/family Time For Goal Achievement: 02/06/22 Potential to Achieve Goals: Good ADL Goals Pt Will Perform Grooming: Independently;standing Pt Will Perform Lower Body Dressing: with modified independence;sit to/from stand Pt Will Transfer to Toilet: with modified independence;ambulating;regular height toilet  Plan Discharge plan remains appropriate;Frequency remains appropriate    Co-evaluation                 AM-PAC OT "6 Clicks" Daily Activity     Outcome Measure   Help from another person eating meals?: None Help from another person taking care of personal grooming?: A Little Help from  another person toileting, which includes using toliet, bedpan, or urinal?: A Little Help from another person bathing (including washing, rinsing, drying)?: A Little Help from another person to put on and taking off regular upper body clothing?: None Help from another person to put on and taking off regular lower body clothing?: A Little 6 Click Score: 20    End of Session Equipment Utilized During Treatment: Rolling walker (2 wheels)  OT Visit Diagnosis: Other abnormalities of gait and mobility (R26.89);Muscle weakness (generalized) (M62.81)   Activity Tolerance Patient tolerated treatment well   Patient Left in bed;with call bell/phone within reach;with family/visitor present   Nurse Communication          Time: 3846-6599 OT Time Calculation (min): 23 min  Charges: OT General Charges $OT Visit: 1 Visit OT Treatments $Self Care/Home Management : 23-37 mins  Dessie Coma, M.S. OTR/L  01/24/22, 12:51 PM  ascom [phone]

## 2022-01-24 NOTE — TOC Transition Note (Signed)
Transition of Care [facility]) - CM/SW Discharge Note   Patient Details  Name: Madeline Martinez MRN: [id number] Date of Birth: 17-May-1945  Transition of [facility]) CM/SW Contact:  Beverly Sessions, RN Phone Number: 01/24/2022, 12:04 PM   Clinical Narrative:     Patient to discharge today Malachy Mood with [facility] notified of discharge         Patient Goals and CMS Choice        Discharge Placement                       Discharge Plan and Services                                     Social Determinants of Health (SDOH) Interventions     Readmission Risk Interventions    05/18/2021    3:56 PM  Readmission Risk Prevention Plan  Transportation Screening Complete  PCP or Specialist Appt within 5-7 Days Complete  Home Care Screening Complete  Medication Review (RN CM) Complete

## 2022-03-15 ENCOUNTER — Other Ambulatory Visit: Payer: Self-pay | Admitting: Obstetrics and Gynecology

## 2022-03-15 DIAGNOSIS — Z1231 Encounter for screening mammogram for malignant neoplasm of breast: Secondary | ICD-10-CM

## 2022-04-02 ENCOUNTER — Other Ambulatory Visit: Payer: Self-pay | Admitting: Orthopaedic Surgery

## 2022-04-02 DIAGNOSIS — M48061 Spinal stenosis, lumbar region without neurogenic claudication: Secondary | ICD-10-CM

## 2022-04-05 ENCOUNTER — Ambulatory Visit
Admission: RE | Admit: 2022-04-05 | Discharge: 2022-04-05 | Disposition: A | Discharge status: Home / Self Care | Payer: Medicare HMO | Source: Ambulatory Visit | Attending: Obstetrics and Gynecology | Admitting: Obstetrics and Gynecology

## 2022-04-05 DIAGNOSIS — Z1231 Encounter for screening mammogram for malignant neoplasm of breast: Secondary | ICD-10-CM | POA: Insufficient documentation

## 2022-04-16 ENCOUNTER — Other Ambulatory Visit: Payer: Self-pay | Admitting: Obstetrics and Gynecology

## 2022-04-16 DIAGNOSIS — R928 Other abnormal and inconclusive findings on diagnostic imaging of breast: Secondary | ICD-10-CM

## 2022-04-16 DIAGNOSIS — N6489 Other specified disorders of breast: Secondary | ICD-10-CM

## 2022-04-17 ENCOUNTER — Other Ambulatory Visit: Payer: Self-pay | Admitting: Internal Medicine

## 2022-04-17 DIAGNOSIS — R0609 Other forms of dyspnea: Secondary | ICD-10-CM

## 2022-04-18 ENCOUNTER — Ambulatory Visit
Admission: RE | Admit: 2022-04-18 | Discharge: 2022-04-18 | Disposition: A | Discharge status: Home / Self Care | Payer: Medicare HMO | Source: Ambulatory Visit | Attending: Obstetrics and Gynecology | Admitting: Obstetrics and Gynecology

## 2022-04-18 DIAGNOSIS — N6489 Other specified disorders of breast: Secondary | ICD-10-CM

## 2022-04-18 DIAGNOSIS — R928 Other abnormal and inconclusive findings on diagnostic imaging of breast: Secondary | ICD-10-CM | POA: Diagnosis not present

## 2022-04-20 ENCOUNTER — Other Ambulatory Visit: Payer: Self-pay | Admitting: Internal Medicine

## 2022-04-23 ENCOUNTER — Ambulatory Visit
Admission: RE | Admit: 2022-04-23 | Discharge: 2022-04-23 | Disposition: A | Discharge status: Home / Self Care | Payer: Medicare HMO | Source: Ambulatory Visit | Attending: Orthopaedic Surgery | Admitting: Orthopaedic Surgery

## 2022-04-23 DIAGNOSIS — M48061 Spinal stenosis, lumbar region without neurogenic claudication: Secondary | ICD-10-CM

## 2022-05-11 ENCOUNTER — Ambulatory Visit (INDEPENDENT_AMBULATORY_CARE_PROVIDER_SITE_OTHER): Payer: Medicare HMO

## 2022-05-11 DIAGNOSIS — R0609 Other forms of dyspnea: Secondary | ICD-10-CM

## 2022-05-11 MED ORDER — TECHNETIUM TC 99M SESTAMIBI GENERIC - CARDIOLITE
33.1000 | Freq: Once | INTRAVENOUS | Status: AC | PRN
  Administered 2022-05-11: 33.1 via INTRAVENOUS

## 2022-05-11 MED ORDER — TECHNETIUM TC 99M SESTAMIBI GENERIC - CARDIOLITE
9.5000 | Freq: Once | INTRAVENOUS | Status: AC | PRN
  Administered 2022-05-11: 9.5 via INTRAVENOUS

## 2022-07-26 ENCOUNTER — Telehealth: Payer: Self-pay

## 2022-07-26 NOTE — Telephone Encounter (Signed)
Returned patients call to schedule colonoscopy.  Left voice message for her to call office back to discuss her referral.    Last colonoscopy was with Dr. Tobi Bastos 12/19/17.  She is not due until 12/20/22.  Thanks, [place], [place]

## 2022-07-26 NOTE — Telephone Encounter (Signed)
Pt left vmm to schedule colonoscopy please return call 

## 2022-08-20 ENCOUNTER — Ambulatory Visit: Payer: Medicare HMO | Admitting: Urology

## 2022-11-19 ENCOUNTER — Telehealth: Payer: Self-pay | Admitting: Gastroenterology

## 2022-11-19 NOTE — Telephone Encounter (Signed)
Patient daughter Bonita Quin) called to schedule office visit for her mother.

## 2023-01-01 ENCOUNTER — Encounter: Payer: Self-pay | Admitting: Gastroenterology

## 2023-01-01 ENCOUNTER — Other Ambulatory Visit: Payer: Self-pay

## 2023-01-01 ENCOUNTER — Ambulatory Visit (INDEPENDENT_AMBULATORY_CARE_PROVIDER_SITE_OTHER): Payer: BLUE CROSS/BLUE SHIELD | Admitting: Gastroenterology

## 2023-01-01 VITALS — BP 133/78 | HR 92 | Temp 98.1°F | Wt 191.0 lb

## 2023-01-01 DIAGNOSIS — R1319 Other dysphagia: Secondary | ICD-10-CM

## 2023-01-01 DIAGNOSIS — Z860101 Personal history of adenomatous and serrated colon polyps: Secondary | ICD-10-CM

## 2023-01-01 DIAGNOSIS — Z8719 Personal history of other diseases of the digestive system: Secondary | ICD-10-CM

## 2023-01-01 DIAGNOSIS — Z1211 Encounter for screening for malignant neoplasm of colon: Secondary | ICD-10-CM

## 2023-01-01 DIAGNOSIS — R1310 Dysphagia, unspecified: Secondary | ICD-10-CM

## 2023-01-01 IMAGING — CT CT HEAD W/O CM
4 series · 16 of 47 positions shown, 18 images · non-contrast
Comparison: 04/01/2020

CLINICAL DATA: Fall, altered mental status



[Series 2: head wo · axial · 0.45mm/px · z∈[-167,-47]mm · 7 of 33 slices shown, 9 images]
[im 5/33  brain]
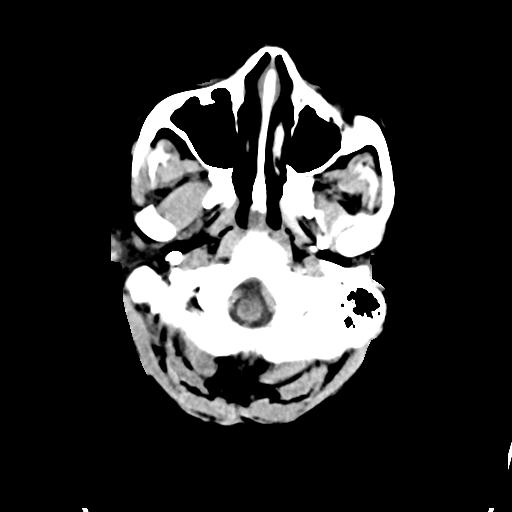
[im 5/33  bone]
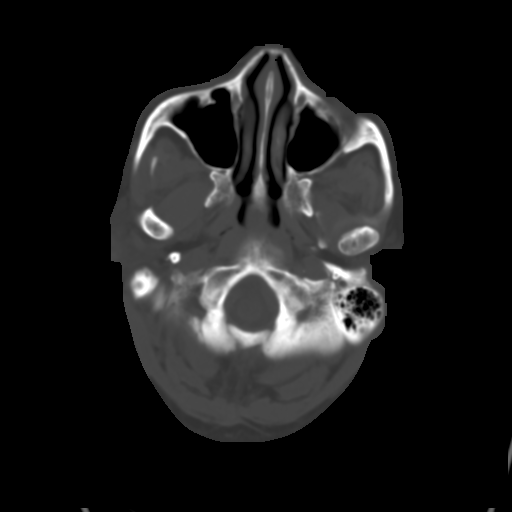
[im 9/33  brain]
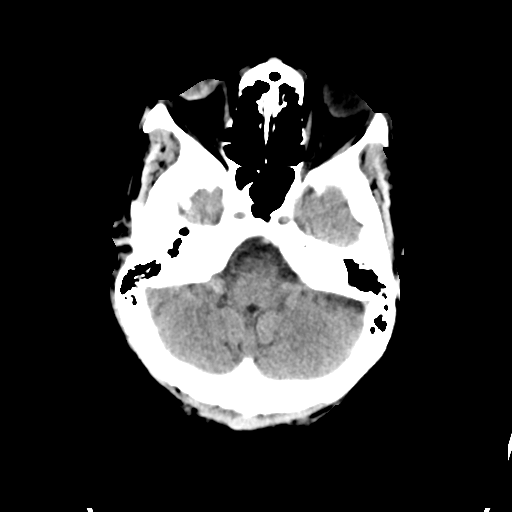
[im 13/33  brain]
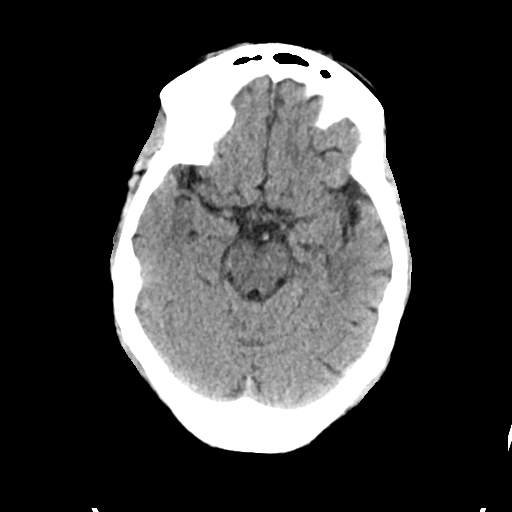
[im 17/33  brain]
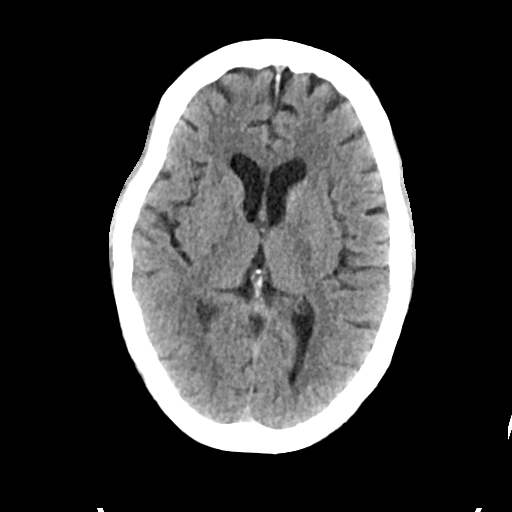
[im 21/33  brain]
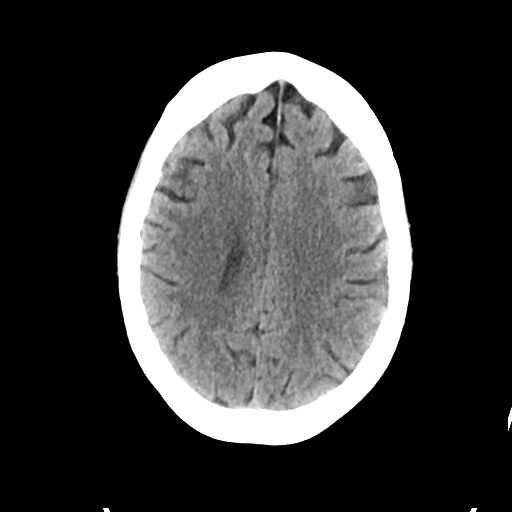
[im 21/33  bone]
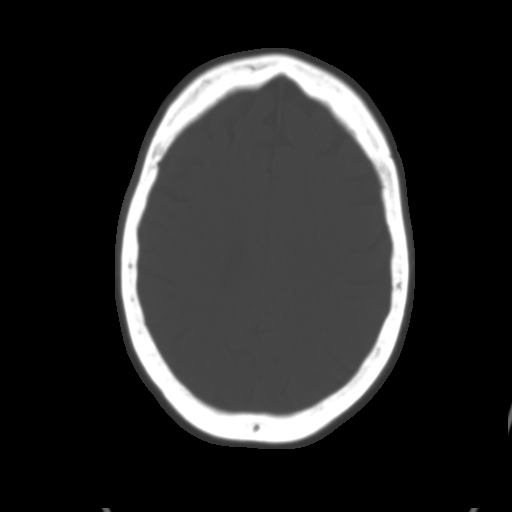
[im 25/33  brain]
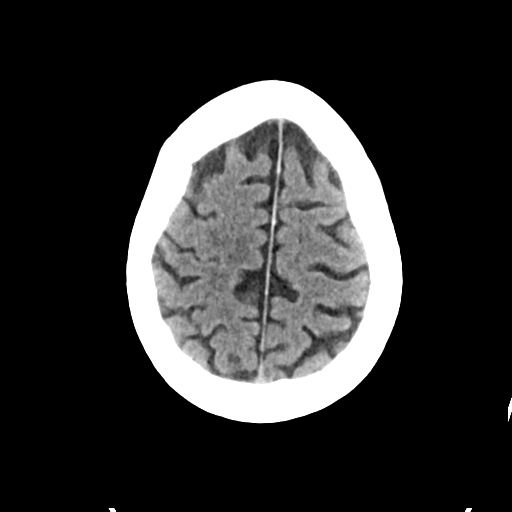
[im 29/33  brain]
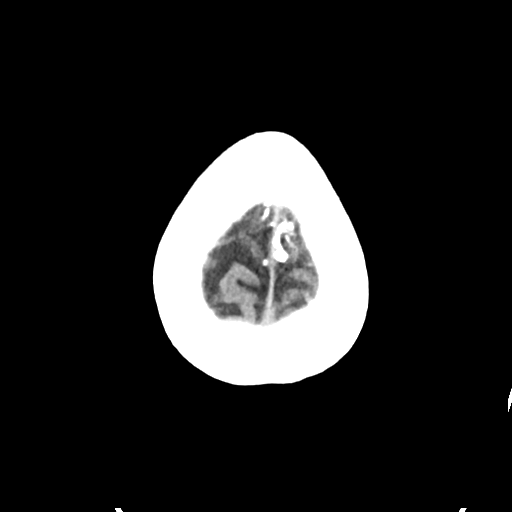

[Series 3: head bone · axial · 0.45mm/px · z∈[-171,-139]mm · 3 of 83 slices shown]
[im 9/83  bone]
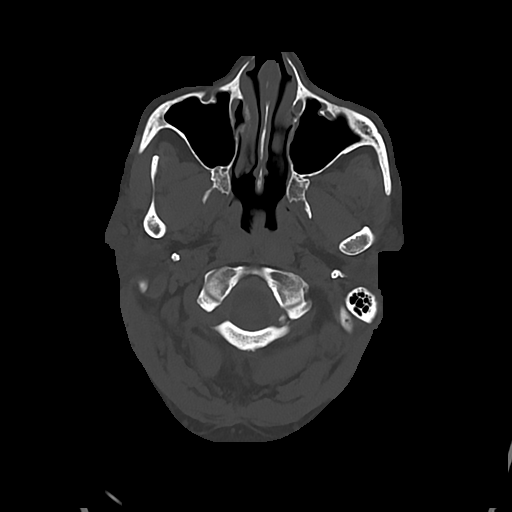
[im 17/83  bone]
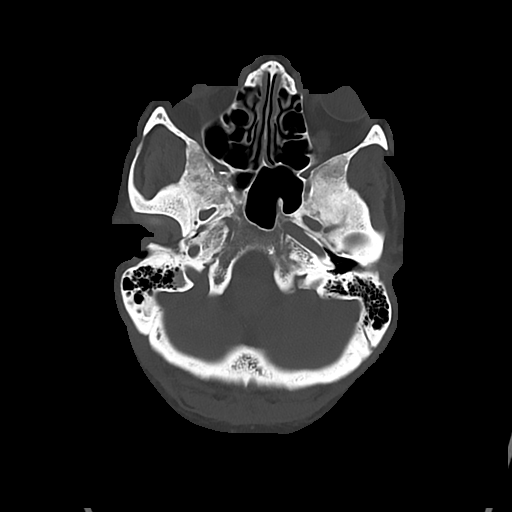
[im 25/83  bone]
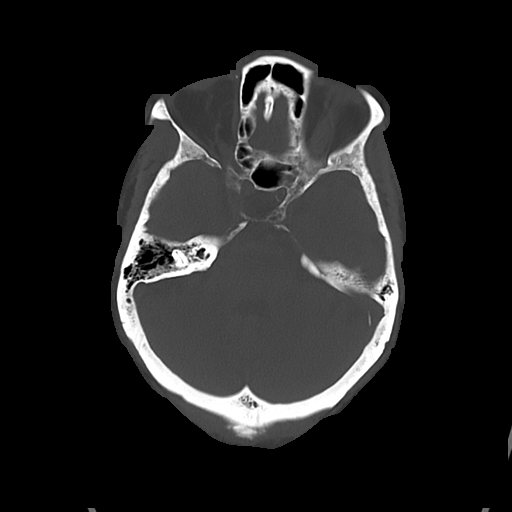

[Series 4: cor soft · coronal · 0.34mm/px · 3 of 68 slices shown]
[im 23/68  brain]
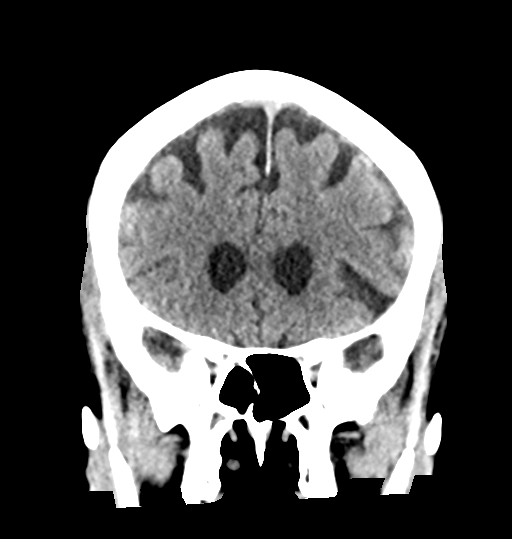
[im 30/68  brain]
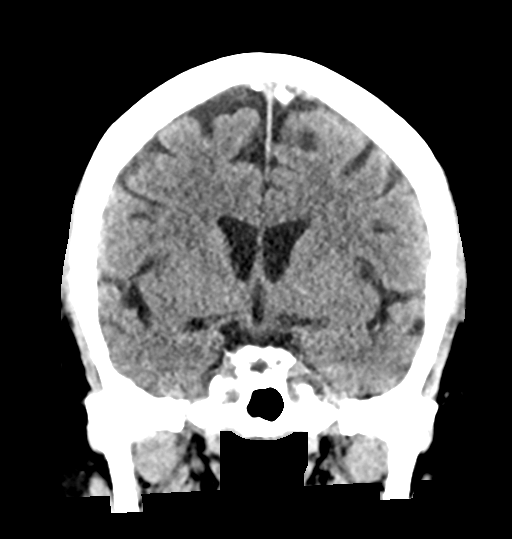
[im 38/68  brain]
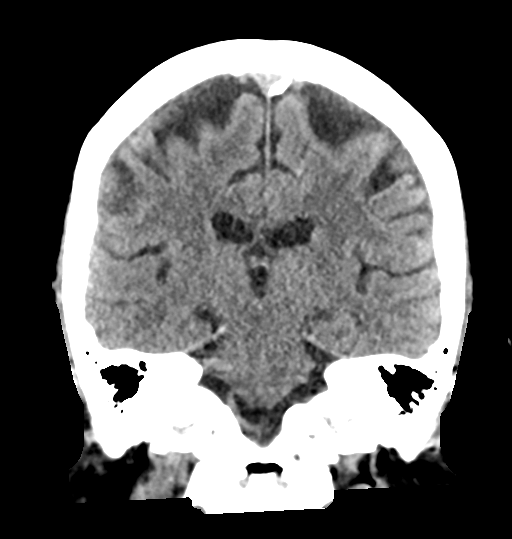

[Series 5: sag soft · sagittal · 0.35mm/px · 3 of 52 slices shown]
[im 18/52  brain]
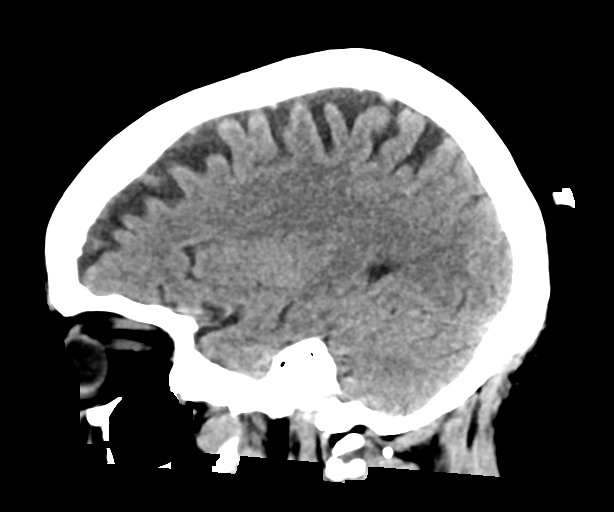
[im 26/52  brain]
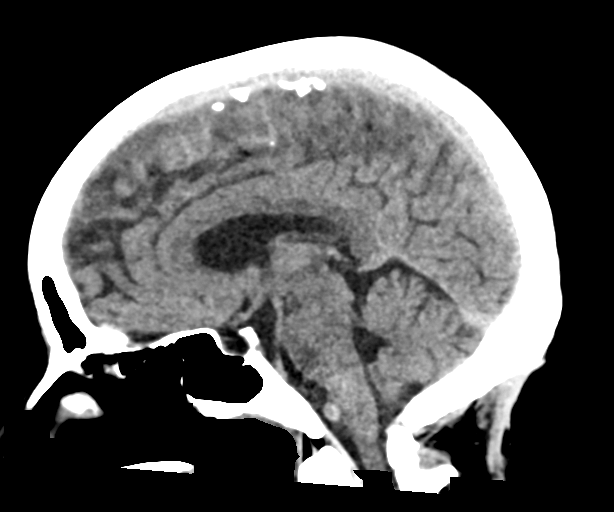
[im 35/52  brain]
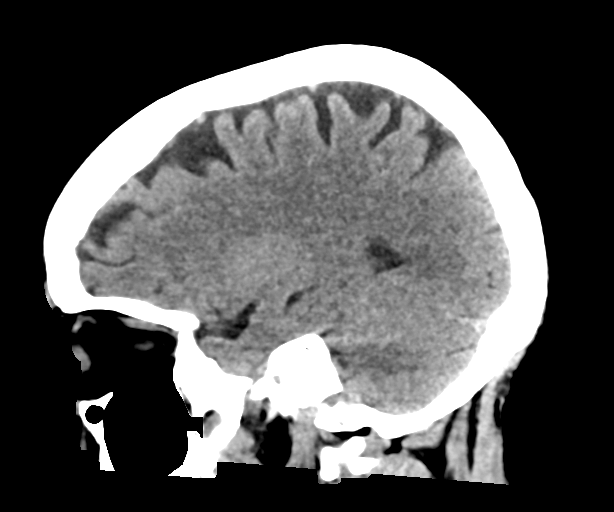

[16 of 47 positions shown; findings below may reference images not displayed]

FINDINGS: CT HEAD FINDINGS

Brain: No evidence of acute infarction, hemorrhage, hydrocephalus,
extra-axial collection or mass lesion/mass effect.

Mild subcortical white matter and periventricular small vessel
ischemic changes.

Vascular: Mild intracranial atherosclerosis.

Skull: Normal. Negative for fracture or focal lesion.

Sinuses/Orbits: The visualized paranasal sinuses are essentially
clear. The mastoid air cells are unopacified. Right phthisis bulbi.

Other: None.

CT CERVICAL SPINE FINDINGS

Alignment: Reversal of the normal cervical lordosis, likely
positional.

Skull base and vertebrae: No acute fracture. No primary bone lesion
or focal pathologic process.

Soft tissues and spinal canal: No prevertebral fluid or swelling. No
visible canal hematoma.

Disc levels: Moderate degenerative changes of the mid cervical
spine. Spinal canal is patent.

Upper chest: Visualized lung apices are clear.

Other: Visualized thyroid is unremarkable.
IMPRESSION: No evidence of acute intracranial abnormality. Small vessel ischemic
changes.

No evidence of traumatic injury to the cervical spine. Moderate
degenerative changes.

## 2023-01-01 IMAGING — MR MR HEAD W/O CM
11 series · 48 of 48 positions shown · non-contrast
Comparison: None.

CLINICAL DATA: Altered mental status

EXAM:
MRI HEAD WITHOUT CONTRAST
TECHNIQUE: Multiplanar, multiecho pulse sequences of the brain and surrounding
structures were obtained without intravenous contrast.

[Series 5: ax dwi_tracew · axial · 3.0mm · 0.65mm/px · z∈[-88,+65]mm · 4 of 48 slices shown]
[im 1/48]
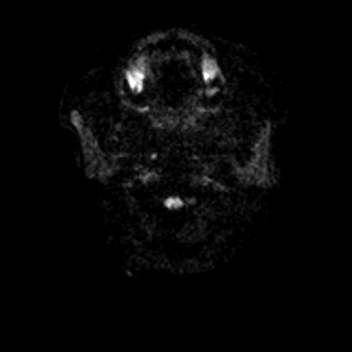
[im 16/48]
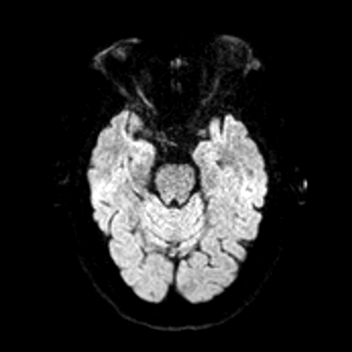
[im 32/48]
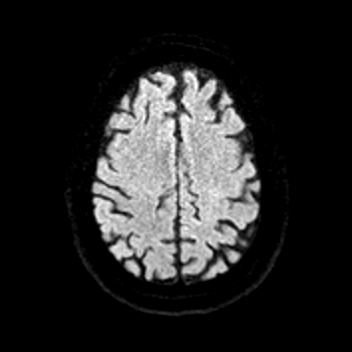
[im 48/48]
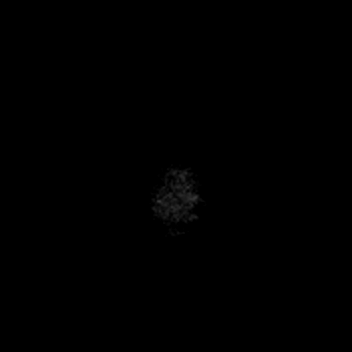

[Series 6: ax dwi_adc · axial · 3.0mm · 0.65mm/px · z∈[-88,+65]mm · 4 of 48 slices shown]
[im 1/48]
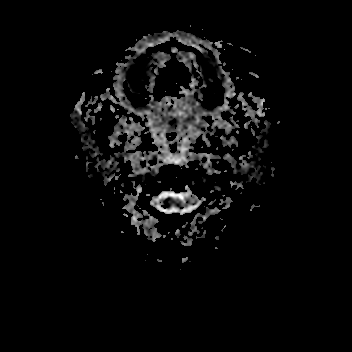
[im 16/48]
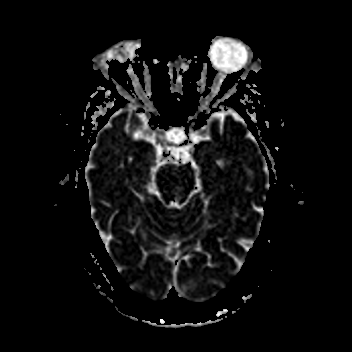
[im 32/48]
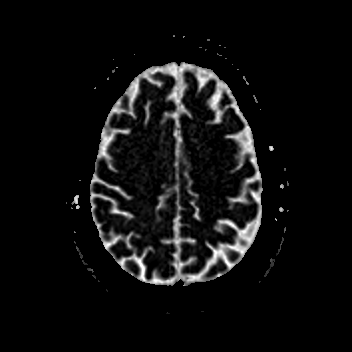
[im 48/48]
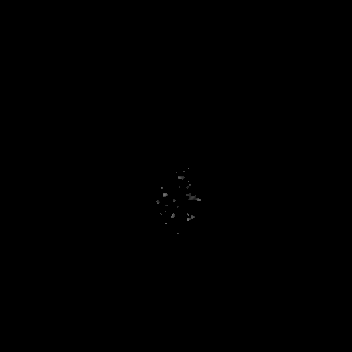

[Series 7: cor dwi_tracew · coronal · 5.0mm · 0.65mm/px · 3 of 40 slices shown]
[im 1/40]
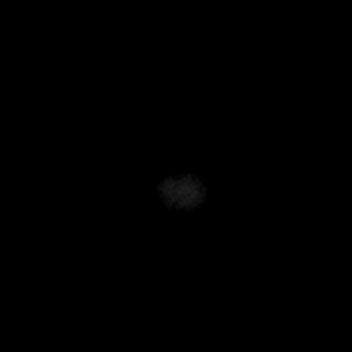
[im 20/40]
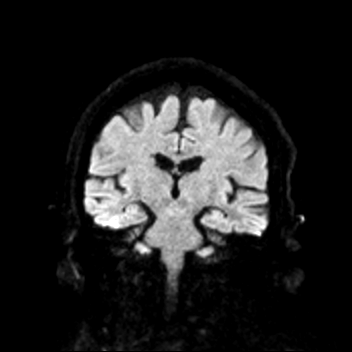
[im 40/40]
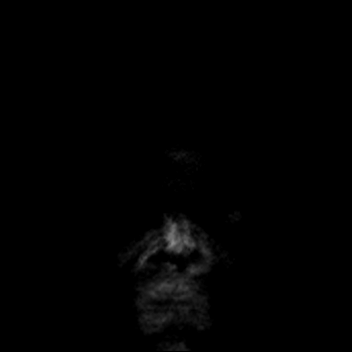

[Series 8: cor dwi_adc · coronal · 5.0mm · 0.65mm/px · 3 of 39 slices shown]
[im 1/39]
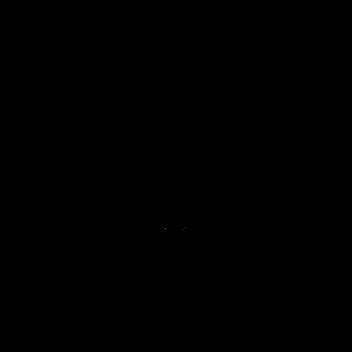
[im 20/39]
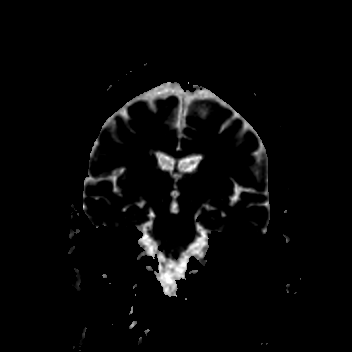
[im 39/39]
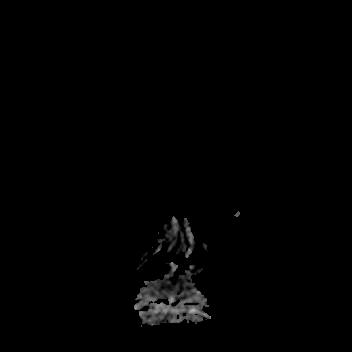

[Series 9: T1 · sagittal · 5.0mm · 0.62mm/px · 2 of 22 slices shown (1 of 2)]
[im 1/22]
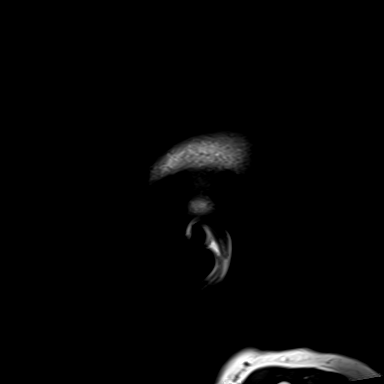
[im 22/22]
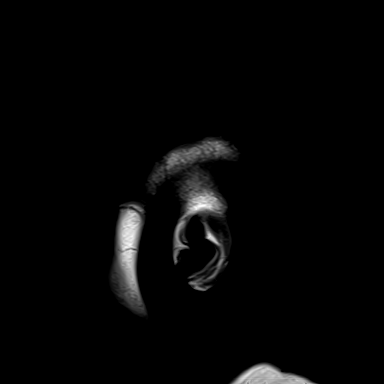

[Series 10: T2 · axial · 5.0mm · 0.53mm/px · z∈[-83,+59]mm · 2 of 25 slices shown (1 of 2)]
[im 1/25]
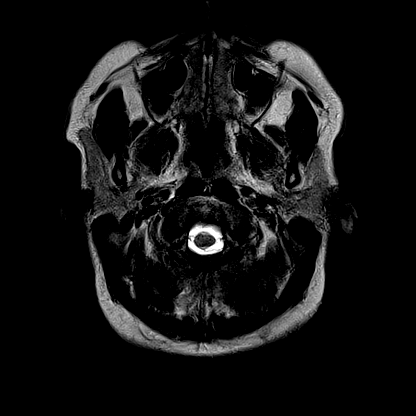
[im 25/25]
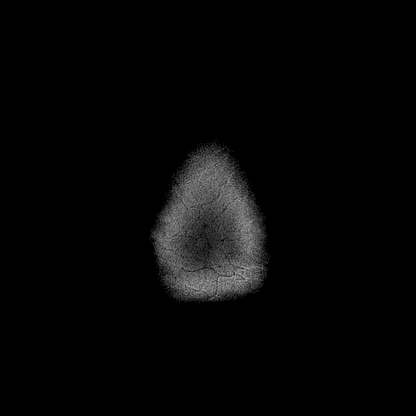

[Series 12: pha_images · axial · 3.0mm · 0.90mm/px · z∈[-99,+75]mm · 5 of 60 slices shown]
[im 1/60]
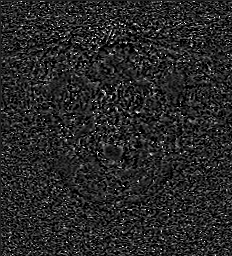
[im 15/60]
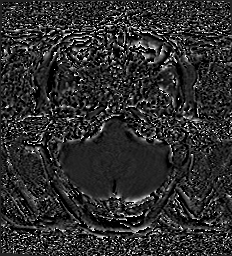
[im 30/60]
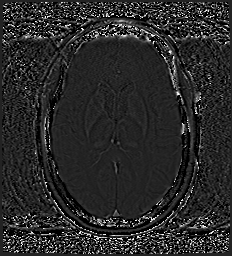
[im 45/60]
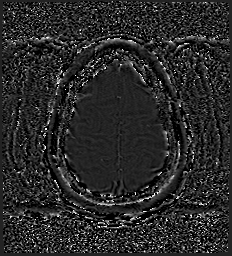
[im 60/60]
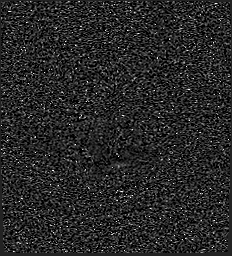

[Series 13: swi_images · axial · 3.0mm · 0.90mm/px · z∈[-99,+75]mm · 5 of 60 slices shown]
[im 1/60]
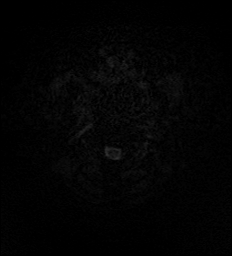
[im 15/60]
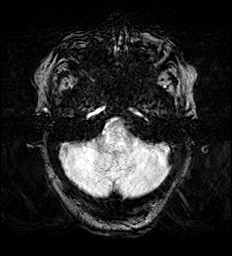
[im 30/60]
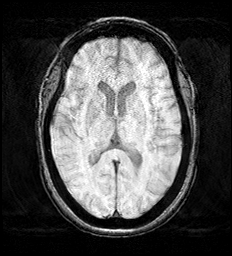
[im 45/60]
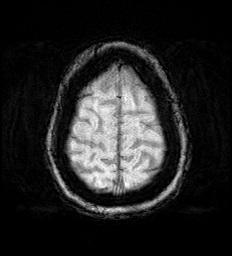
[im 60/60]
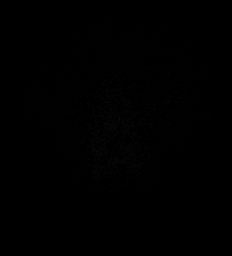

[Series 15: FLAIR · axial · 3.0mm · 0.53mm/px · z∈[-92,+67]mm · 4 of 55 slices shown]
[im 1/55]
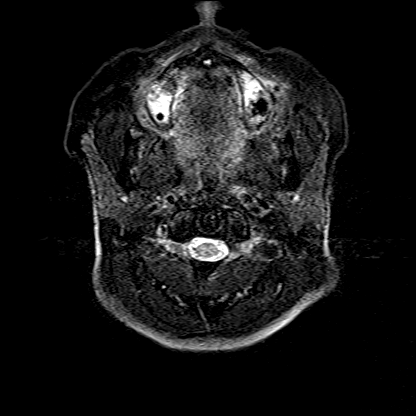
[im 19/55]
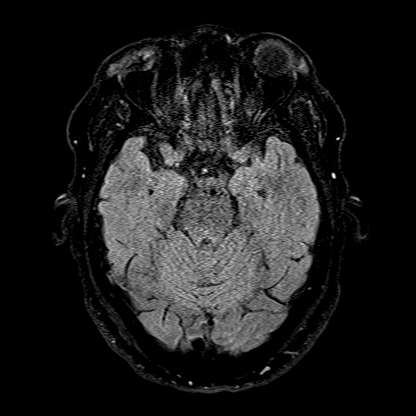
[im 37/55]
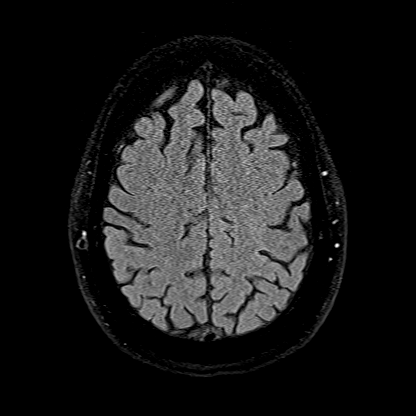
[im 55/55]
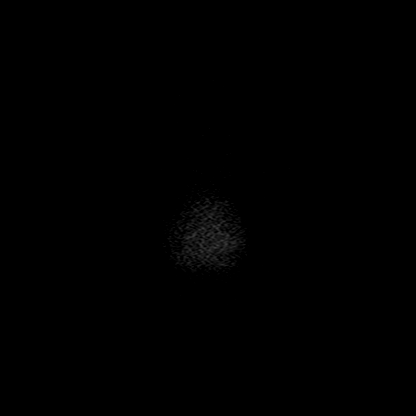

[Series 16: T1 · axial · 1.0mm · 0.98mm/px · z∈[-96,+76]mm · 14 of 176 slices shown (2 of 2)]
[im 1/176]
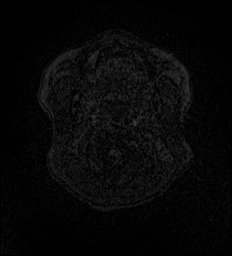
[im 14/176]
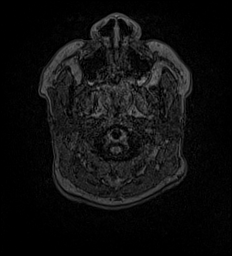
[im 27/176]
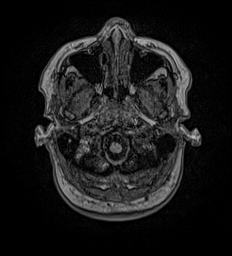
[im 41/176]
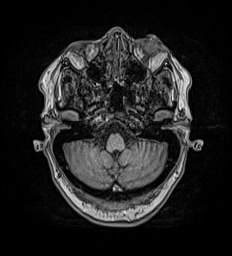
[im 54/176]
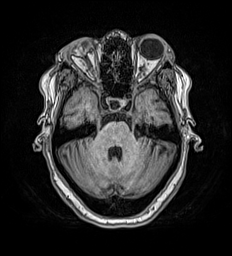
[im 68/176]
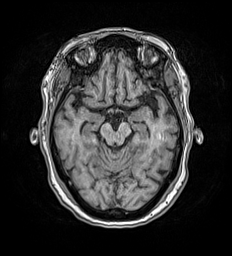
[im 81/176]
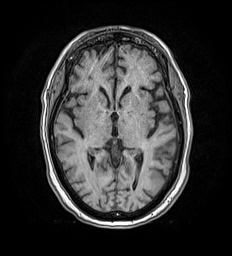
[im 95/176]
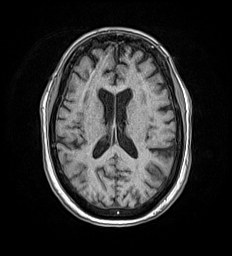
[im 108/176]
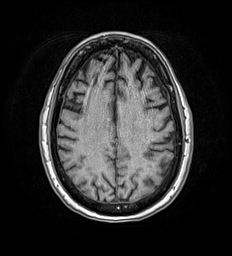
[im 122/176]
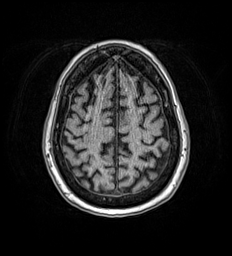
[im 135/176]
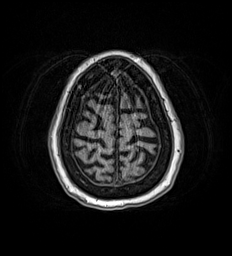
[im 149/176]
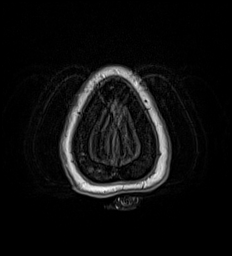
[im 162/176]
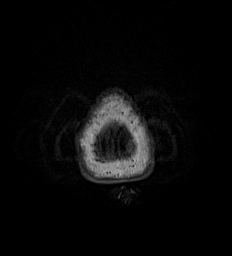
[im 176/176]
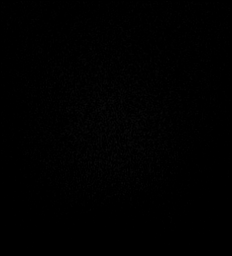

[Series 17: T2 · coronal · 5.0mm · 0.57mm/px · 2 of 29 slices shown (2 of 2)]
[im 1/29]
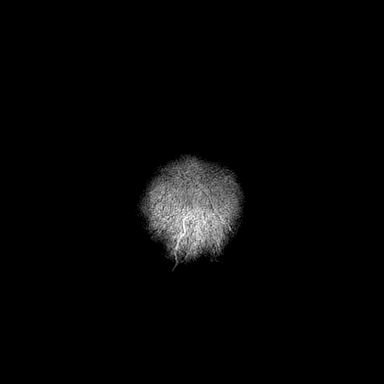
[im 29/29]
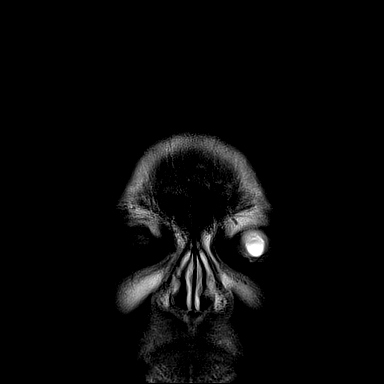

[48 of 48 positions shown; findings below may reference images not displayed]

FINDINGS: Brain: No acute infarct, mass effect or extra-axial collection. No
acute or chronic hemorrhage. Normal white matter signal, parenchymal
volume and CSF spaces. A partially empty sella is incidentally
noted.

Vascular: Major flow voids are preserved.

Skull and upper cervical spine: Normal calvarium and skull base.
Visualized upper cervical spine and soft tissues are normal.

Sinuses/Orbits:No paranasal sinus fluid levels or advanced mucosal
thickening. No mastoid or middle ear effusion. Right phthisis bulbi.
IMPRESSION: Normal brain MRI.

## 2023-01-01 MED ORDER — NA SULFATE-K SULFATE-MG SULF 17.5-3.13-1.6 GM/177ML PO SOLN: 354.0000 mL | Freq: Once | ORAL | 0 refills | Status: AC

## 2023-01-01 NOTE — Progress Notes (Signed)
[name], [name] [address]  [address]  [place], [place] [address]  Main: [phone]  Fax: [phone]   Gastroenterology Consultation  Referring Provider:     Sherrie Mustache, MD Primary Care Physician:  Sherrie Mustache, MD Primary Gastroenterologist:  Dr. Wyline Mood  Reason for Consultation:     Dysphagia         HPI:   Madeline Martinez is a 77 y.o. y/o female referred for consultation & management  by Dr. Sherrie Mustache, MD.   Summary of history :  Last seen at out office back in 2020 for dysphagia.  08/28/2018: EGD: Stricture at the GE junction was dilated to 18 mm. A polyp was seen in the greater curvature the stomach close to the pylorus sessile in nature about 15 mm in size biopsy suggested a hyperplastic polyp. EMR performed and polyps were hyperplastic - repeat EGD was suggested in 6 months which she didn't follow up on .   10/08/2018 EUS with Dr Meridee Score   Interval history 08/28/2018 -01/01/2023  She is states for the past few months she has been having difficulty swallowing pills sometimes gets stuck but goes down eventually.  She also suffers from a dry mouth.  On Ozempic losing some weight.  No change in bowel habits.  No other complaints.  She takes a PPI every day.    Past Medical History:  Diagnosis Date   Acid reflux    Anxiety    Depression    Diabetes mellitus    Dysrhythmia    Gout    Heart murmur    HTN (hypertension)    Hyperlipidemia    Sleep apnea     Past Surgical History:  Procedure Laterality Date   ABDOMINAL HYSTERECTOMY     BACK SURGERY  02/26/2005   DISCS   BIOPSY  10/08/2018   Procedure: BIOPSY;  Surgeon: Lemar Lofty., MD;  Location: [facility];  Service: Gastroenterology;;   BREAST EXCISIONAL BIOPSY Left    COLONOSCOPY WITH PROPOFOL N/A 12/19/2017   Procedure: COLONOSCOPY WITH PROPOFOL;  Surgeon: Wyline Mood, MD;  Location: [facility];  Service: Gastroenterology;  Laterality: N/A;   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   ENDOSCOPIC MUCOSAL RESECTION N/A 10/08/2018   Procedure: ENDOSCOPIC MUCOSAL RESECTION;  Surgeon: Meridee Score Netty Starring., MD;  Location: [facility];  Service: Gastroenterology;  Laterality: N/A;   ESOPHAGOGASTRODUODENOSCOPY (EGD) WITH PROPOFOL N/A 08/28/2018   Procedure: ESOPHAGOGASTRODUODENOSCOPY (EGD) WITH PROPOFOL;  Surgeon: Wyline Mood, MD;  Location: [facility];  Service: Gastroenterology;  Laterality: N/A;   ESOPHAGOGASTRODUODENOSCOPY (EGD) WITH PROPOFOL N/A 10/08/2018   Procedure: ESOPHAGOGASTRODUODENOSCOPY (EGD) WITH PROPOFOL;  Surgeon: Meridee Score Netty Starring., MD;  Location: [facility];  Service: Gastroenterology;  Laterality: N/A;   EUS N/A 10/08/2018   Procedure: UPPER ENDOSCOPIC ULTRASOUND (EUS) RADIAL;  Surgeon: Lemar Lofty., MD;  Location: [facility];  Service: Gastroenterology;  Laterality: N/A;   PARTIAL HYSTERECTOMY  02/27/1984   POLYPECTOMY  10/08/2018   Procedure: POLYPECTOMY;  Surgeon: Mansouraty, Netty Starring., MD;  Location: [facility];  Service: Gastroenterology;;    Prior to Admission medications   Medication Sig Start Date End Date Taking? Authorizing Provider  ADVAIR HFA 115-21 MCG/ACT inhaler Inhale 2 puffs into the lungs 2 (two) times daily. 04/25/21   [provider]  albuterol (PROVENTIL) (2.5 MG/3ML) 0.083% nebulizer solution Take 3 mLs (2.5 mg total) by nebulization every 4 (four) hours as needed for wheezing or shortness of breath (please include nebulizer  machine, hoses, and mask if needed.). 01/24/22   Sunnie Nielsen, DO  albuterol (VENTOLIN HFA) 108 (90 Base) MCG/ACT inhaler Inhale 2 puffs into the lungs every 4 (four) hours as needed for wheezing or shortness of breath. 01/24/22   Sunnie Nielsen, DO  allopurinol (ZYLOPRIM) 100 MG tablet Take 100 mg by mouth daily with lunch.  04/30/16   [provider]  ascorbic acid (VITAMIN C) 1000 MG tablet Take 1,000 mg by mouth 3 (three)  times daily.    [provider]  aspirin 81 MG EC tablet Take 81 mg by mouth daily.    [provider]  atorvastatin (LIPITOR) 80 MG tablet Take 80 mg by mouth daily.    [provider]  atropine 1 % ophthalmic solution Place 1 drop into the right eye 2 (two) times daily. 04/06/21   [provider]  Biotin 5000 MCG TABS Take 5,000 mcg by mouth daily. HAIR/SKIN/NAILS    [provider]  carvedilol (COREG) 12.5 MG tablet Take 12.5 mg by mouth 2 (two) times daily.    [provider]  cetirizine (ZYRTEC) 10 MG tablet Take 10 mg by mouth daily. 04/20/17   [provider]  Cholecalciferol (VITAMIN D-3) 125 MCG (5000 UT) TABS Take 5,000 Units by mouth daily.    [provider]  DUPIXENT 300 MG/2ML SOPN Inject 300 mg into the skin every 14 (fourteen) days. 05/01/21   [provider]  ferrous sulfate 325 (65 FE) MG tablet Take 325 mg by mouth daily. 12/03/19   [provider]  FLUoxetine (PROZAC) 40 MG capsule Take 40 mg by mouth daily.    [provider]  glipiZIDE (GLUCOTROL XL) 5 MG 24 hr tablet Take 1 tablet (5 mg total) by mouth daily. 05/04/21   Enedina Finner, MD  hydrochlorothiazide (HYDRODIURIL) 25 MG tablet Take 25 mg by mouth daily. 06/26/21   [provider]  HYDROcodone-acetaminophen (NORCO) 10-325 MG tablet Take 1 tablet by mouth every 8 (eight) hours as needed. 08/02/21   [provider]  levothyroxine (SYNTHROID) 25 MCG tablet Take 25 mcg by mouth daily. 02/01/20   [provider]  LORazepam (ATIVAN) 1 MG tablet Take 1 tablet (1 mg total) by mouth 2 (two) times daily as needed for anxiety. 05/20/21   Sreenath, Jonelle Sports, MD  losartan (COZAAR) 100 MG tablet Take 100 mg by mouth daily. 02/09/20   [provider]  metFORMIN (GLUCOPHAGE) 1000 MG tablet Take 1,000 mg by mouth 2 (two) times daily.    [provider]  metoCLOPramide (REGLAN) 10 MG tablet Take 1 tablet (10  mg total) by mouth every 8 (eight) hours as needed for up to 14 days for nausea. 05/20/21 06/03/21  Tresa Moore, MD  mirabegron ER (MYRBETRIQ) 50 MG TB24 tablet Take 1 tablet (50 mg total) by mouth daily. 08/14/21   Michiel Cowboy A, PA-C  mirtazapine (REMERON) 15 MG tablet Take 15 mg by mouth at bedtime.    [provider]  omeprazole (PRILOSEC) 20 MG capsule Take 20 mg by mouth daily as needed. 05/24/21   [provider]  prednisoLONE acetate (PRED FORTE) 1 % ophthalmic suspension Place 1 drop into the right eye 2 (two) times daily. 04/24/21   [provider]  pregabalin (LYRICA) 75 MG capsule Take 1 capsule (75 mg total) by mouth 2 (two) times daily. 05/20/21   Tresa Moore, MD  Probiotic Product (PROBIOTIC PO) Take 1 tablet by mouth daily.  [provider]  promethazine-dextromethorphan (PROMETHAZINE-DM) 6.25-15 MG/5ML syrup Take 5 mLs by mouth 4 (four) times daily as needed for cough. 01/24/22   Sunnie Nielsen, DO  TRESIBA FLEXTOUCH 100 UNIT/ML FlexTouch Pen Inject 15 Units into the skin daily. 05/04/21   Enedina Finner, MD  vitamin E 45 MG (100 UNITS) capsule Take by mouth.    [provider]    Family History  Problem Relation Age of Onset   Breast cancer Mother    Heart failure Father    Other Sister        DEGEN.DISC DISEASE   Breast cancer Maternal Grandmother    Kidney cancer Neg Hx    Kidney disease Neg Hx    Prostate cancer Neg Hx      Social History   Tobacco Use   Smoking status: Never   Smokeless tobacco: Never  Vaping Use   Vaping status: Never Used  Substance Use Topics   Alcohol use: No   Drug use: No    Allergies as of 01/01/2023   (No Known Allergies)    Review of Systems:    All systems reviewed and negative except where noted in HPI.   Physical Exam:  BP 133/78   Pulse 92   Temp 98.1 F (36.7 C) (Oral)   Wt 191 lb (86.6 kg)   BMI 37.30 kg/m  No LMP recorded. Patient has had a  hysterectomy. Psych:  Alert and cooperative. Normal mood and affect. General:   Alert,  Well-developed, well-nourished, pleasant and cooperative in NAD Head:  Normocephalic and atraumatic. Neurologic:  Alert and oriented x3;  grossly normal neurologically. Psych:  Alert and cooperative. Normal mood and affect.  Imaging Studies: No results found.  Assessment and Plan:   Madeline Martinez is a 77 y.o. y/o female has been referred for dysphagia. H/o resection of a large hyperplastic gastric polyp and overdue for  surveillance. She is also due for surveillance colonoscopy due to tubular adenoma in 2019.  History of dilation of esophageal stricture in the past.  She is already on Dupixent for asthma losing weight on Ozempic.  She has medications in her list which could contribute to esophageal dysmotility.  Plan  EGD+colonoscopy to evaluate for dysphagia and surveillance due to personal history of colon polyps   I have discussed alternative options, risks & benefits,  which include, but are not limited to, bleeding, infection, perforation,respiratory complication & drug reaction.  The patient agrees with this plan & written consent will be obtained.     Follow up in as needed  Dr [name],[name]

## 2023-02-04 ENCOUNTER — Telehealth: Payer: Self-pay

## 2023-02-04 NOTE — Telephone Encounter (Signed)
The patient daughter Bonita Quin) called in to reschedule  her mother procedure do to an eye affection.

## 2023-02-06 ENCOUNTER — Ambulatory Visit: Admission: RE | Admit: 2023-02-06 | Payer: Medicare HMO | Source: Home / Self Care | Admitting: Gastroenterology

## 2023-02-06 SURGERY — COLONOSCOPY WITH PROPOFOL
Anesthesia: General

## 2023-03-27 ENCOUNTER — Other Ambulatory Visit: Payer: Self-pay | Admitting: Internal Medicine

## 2023-03-27 DIAGNOSIS — Z1231 Encounter for screening mammogram for malignant neoplasm of breast: Secondary | ICD-10-CM

## 2023-04-10 ENCOUNTER — Inpatient Hospital Stay
Admission: EM | Admit: 2023-04-10 | Discharge: 2023-04-15 | DRG: 638 | Disposition: A | Discharge status: Home Health | Payer: Medicare HMO | Attending: Student | Admitting: Student

## 2023-04-10 ENCOUNTER — Emergency Department: Payer: Medicare HMO

## 2023-04-10 ENCOUNTER — Other Ambulatory Visit: Payer: Self-pay

## 2023-04-10 DIAGNOSIS — R531 Weakness: Secondary | ICD-10-CM

## 2023-04-10 DIAGNOSIS — Z6835 Body mass index (BMI) 35.0-35.9, adult: Secondary | ICD-10-CM | POA: Diagnosis not present

## 2023-04-10 DIAGNOSIS — F32A Depression, unspecified: Secondary | ICD-10-CM | POA: Diagnosis present

## 2023-04-10 DIAGNOSIS — Z7982 Long term (current) use of aspirin: Secondary | ICD-10-CM | POA: Diagnosis not present

## 2023-04-10 DIAGNOSIS — E11649 Type 2 diabetes mellitus with hypoglycemia without coma: Principal | ICD-10-CM

## 2023-04-10 DIAGNOSIS — H44002 Unspecified purulent endophthalmitis, left eye: Secondary | ICD-10-CM | POA: Insufficient documentation

## 2023-04-10 DIAGNOSIS — M109 Gout, unspecified: Secondary | ICD-10-CM | POA: Diagnosis present

## 2023-04-10 DIAGNOSIS — Z79899 Other long term (current) drug therapy: Secondary | ICD-10-CM | POA: Diagnosis not present

## 2023-04-10 DIAGNOSIS — E785 Hyperlipidemia, unspecified: Secondary | ICD-10-CM | POA: Diagnosis present

## 2023-04-10 DIAGNOSIS — Z7984 Long term (current) use of oral hypoglycemic drugs: Secondary | ICD-10-CM | POA: Diagnosis not present

## 2023-04-10 DIAGNOSIS — E860 Dehydration: Secondary | ICD-10-CM

## 2023-04-10 DIAGNOSIS — G4733 Obstructive sleep apnea (adult) (pediatric): Secondary | ICD-10-CM | POA: Insufficient documentation

## 2023-04-10 DIAGNOSIS — Z7951 Long term (current) use of inhaled steroids: Secondary | ICD-10-CM

## 2023-04-10 DIAGNOSIS — E162 Hypoglycemia, unspecified: Secondary | ICD-10-CM | POA: Diagnosis present

## 2023-04-10 DIAGNOSIS — F419 Anxiety disorder, unspecified: Secondary | ICD-10-CM | POA: Diagnosis present

## 2023-04-10 DIAGNOSIS — N281 Cyst of kidney, acquired: Secondary | ICD-10-CM | POA: Diagnosis present

## 2023-04-10 DIAGNOSIS — I129 Hypertensive chronic kidney disease with stage 1 through stage 4 chronic kidney disease, or unspecified chronic kidney disease: Secondary | ICD-10-CM | POA: Diagnosis present

## 2023-04-10 DIAGNOSIS — Z1152 Encounter for screening for COVID-19: Secondary | ICD-10-CM | POA: Diagnosis not present

## 2023-04-10 DIAGNOSIS — E875 Hyperkalemia: Secondary | ICD-10-CM | POA: Diagnosis present

## 2023-04-10 DIAGNOSIS — E8721 Acute metabolic acidosis: Secondary | ICD-10-CM | POA: Diagnosis present

## 2023-04-10 DIAGNOSIS — Z79890 Hormone replacement therapy (postmenopausal): Secondary | ICD-10-CM

## 2023-04-10 DIAGNOSIS — J45909 Unspecified asthma, uncomplicated: Secondary | ICD-10-CM | POA: Diagnosis present

## 2023-04-10 DIAGNOSIS — N1831 Chronic kidney disease, stage 3a: Secondary | ICD-10-CM | POA: Diagnosis present

## 2023-04-10 DIAGNOSIS — I10 Essential (primary) hypertension: Secondary | ICD-10-CM | POA: Diagnosis present

## 2023-04-10 DIAGNOSIS — E1122 Type 2 diabetes mellitus with diabetic chronic kidney disease: Secondary | ICD-10-CM | POA: Diagnosis present

## 2023-04-10 DIAGNOSIS — Z8249 Family history of ischemic heart disease and other diseases of the circulatory system: Secondary | ICD-10-CM

## 2023-04-10 DIAGNOSIS — E039 Hypothyroidism, unspecified: Secondary | ICD-10-CM | POA: Diagnosis present

## 2023-04-10 DIAGNOSIS — N179 Acute kidney failure, unspecified: Secondary | ICD-10-CM | POA: Diagnosis present

## 2023-04-10 DIAGNOSIS — D649 Anemia, unspecified: Secondary | ICD-10-CM | POA: Insufficient documentation

## 2023-04-10 DIAGNOSIS — D539 Nutritional anemia, unspecified: Secondary | ICD-10-CM | POA: Diagnosis present

## 2023-04-10 DIAGNOSIS — Z947 Corneal transplant status: Secondary | ICD-10-CM

## 2023-04-10 DIAGNOSIS — E1159 Type 2 diabetes mellitus with other circulatory complications: Secondary | ICD-10-CM | POA: Diagnosis present

## 2023-04-10 DIAGNOSIS — J111 Influenza due to unidentified influenza virus with other respiratory manifestations: Secondary | ICD-10-CM

## 2023-04-10 DIAGNOSIS — E119 Type 2 diabetes mellitus without complications: Secondary | ICD-10-CM

## 2023-04-10 LAB — CBC WITH DIFFERENTIAL/PLATELET
Abs Immature Granulocytes: 0.04 10*3/uL (ref 0.00–0.07)
Basophils Absolute: 0 10*3/uL (ref 0.0–0.1)
Basophils Relative: 0 %
Eosinophils Absolute: 0 10*3/uL (ref 0.0–0.5)
Eosinophils Relative: 0 %
HCT: 37.9 % (ref 36.0–46.0)
Hemoglobin: 11.9 g/dL — ABNORMAL LOW (ref 12.0–15.0)
Immature Granulocytes: 1 %
Lymphocytes Relative: 14 %
Lymphs Abs: 0.9 10*3/uL (ref 0.7–4.0)
MCH: 31.6 pg (ref 26.0–34.0)
MCHC: 31.4 g/dL (ref 30.0–36.0)
MCV: 100.5 fL — ABNORMAL HIGH (ref 80.0–100.0)
Monocytes Absolute: 0.5 10*3/uL (ref 0.1–1.0)
Monocytes Relative: 7 %
Neutro Abs: 5.2 10*3/uL (ref 1.7–7.7)
Neutrophils Relative %: 78 %
Platelets: 278 10*3/uL (ref 150–400)
RBC: 3.77 MIL/uL — ABNORMAL LOW (ref 3.87–5.11)
RDW: 21.2 % — ABNORMAL HIGH (ref 11.5–15.5)
Smear Review: NORMAL
WBC: 6.6 10*3/uL (ref 4.0–10.5)
nRBC: 0 % (ref 0.0–0.2)

## 2023-04-10 LAB — RESP PANEL BY RT-PCR (RSV, FLU A&B, COVID)  RVPGX2
Influenza A by PCR: NEGATIVE
Influenza B by PCR: NEGATIVE
Resp Syncytial Virus by PCR: NEGATIVE
SARS Coronavirus 2 by RT PCR: NEGATIVE

## 2023-04-10 LAB — CBG MONITORING, ED
Glucose-Capillary: 104 mg/dL — ABNORMAL HIGH (ref 70–99)
Glucose-Capillary: 38 mg/dL — CL (ref 70–99)
Glucose-Capillary: 49 mg/dL — ABNORMAL LOW (ref 70–99)

## 2023-04-10 LAB — BASIC METABOLIC PANEL
Anion gap: 16 — ABNORMAL HIGH (ref 5–15)
BUN: 69 mg/dL — ABNORMAL HIGH (ref 8–23)
CO2: 15 mmol/L — ABNORMAL LOW (ref 22–32)
Calcium: 8.9 mg/dL (ref 8.9–10.3)
Chloride: 106 mmol/L (ref 98–111)
Creatinine, Ser: 3.54 mg/dL — ABNORMAL HIGH (ref 0.44–1.00)
GFR, Estimated: 13 mL/min — ABNORMAL LOW (ref >60)
Glucose, Bld: 56 mg/dL — ABNORMAL LOW (ref 70–99)
Potassium: 5.8 mmol/L — ABNORMAL HIGH (ref 3.5–5.1)
Sodium: 137 mmol/L (ref 135–145)

## 2023-04-10 MED ORDER — DEXTROSE 50 % IV SOLN
INTRAVENOUS | Status: AC
  Administered 2023-04-10: 50 mL via INTRAVENOUS
  Filled 2023-04-10: qty 50

## 2023-04-10 MED ORDER — SODIUM CHLORIDE 0.9 % IV BOLUS: 1000.0000 mL | Freq: Once | INTRAVENOUS | Status: DC

## 2023-04-10 MED ORDER — DEXTROSE 10 % IV SOLN: INTRAVENOUS | Status: DC

## 2023-04-10 MED ORDER — LACTATED RINGERS IV BOLUS
1000.0000 mL | Freq: Once | INTRAVENOUS | Status: AC
  Administered 2023-04-10: 1000 mL via INTRAVENOUS

## 2023-04-10 NOTE — ED Triage Notes (Signed)
Pt reports her family came to check on her and she had been laying I bed, fatigued and had no energy. Pts blood sugar was checked and it was 33, pt also reporting shortness of breath that began today. Denies chest pain cough or congestion.

## 2023-04-10 NOTE — ED Notes (Signed)
Pt given sandwich tray and ginger ale, pt sitting up in bed eating, NADN

## 2023-04-10 NOTE — ED Provider Notes (Signed)
[facility] Provider Note    Event Date/Time   First MD Initiated Contact with Patient 04/10/23 1958     (approximate)   History   Chief Complaint: Hypoglycemia and Shortness of Breath   HPI  Madeline Martinez is a 78 y.o. female with a history of hypertension, diabetes, GERD, and current treatment of Acanthamoeba keratitis at [facility] who comes to the ED due to fatigue and shortness of breath that has been worsening for the past 3 days.  Also reports having decreased appetite and poor oral intake.  No falls or trauma, no vomiting or diarrhea, no fever.  Checked her blood sugar at home, found it to be 33, drank 2 glasses of orange juice and felt a little bit better.        Physical Exam   Triage Vital Signs: ED Triage Vitals  Encounter Vitals Group     BP 04/10/23 1955 127/74     Systolic BP Percentile --      Diastolic BP Percentile --      Pulse Rate 04/10/23 1955 (!) 105     Resp 04/10/23 1955 18     Temp 04/10/23 1955 97.6 F (36.4 C)     Temp Source 04/10/23 1955 Oral     SpO2 04/10/23 1955 96 %     Weight 04/10/23 1950 181 lb (82.1 kg)     Height 04/10/23 1950 5' (1.524 m)     Head Circumference --      Peak Flow --      Pain Score 04/10/23 1950 0     Pain Loc --      Pain Education --      Exclude from Growth Chart --     Most recent vital signs: Vitals:   04/10/23 1955 04/10/23 2300  BP: 127/74 (!) 152/97  Pulse: (!) 105 (!) 101  Resp: 18 16  Temp: 97.6 F (36.4 C)   SpO2: 96% 96%    General: Awake, no distress.  CV:  Good peripheral perfusion.  Tachycardia heart rate 105 Resp:  Normal effort.  Clear to auscultation bilaterally, no crackles or wheezing Abd:  No distention.  Soft nontender Other:  Dry oral mucosa.  Right eye with chronic scarring, left eye with right conjunctivitis and edema, being managed by [facility].   ED Results / Procedures / Treatments   Labs (all labs ordered are listed, but only  abnormal results are displayed) Labs Reviewed  CBC WITH DIFFERENTIAL/PLATELET - Abnormal; Notable for the following components:      Result Value   RBC 3.77 (*)    Hemoglobin 11.9 (*)    MCV 100.5 (*)    RDW 21.2 (*)    All other components within normal limits  BASIC METABOLIC PANEL - Abnormal; Notable for the following components:   Potassium 5.8 (*)    CO2 15 (*)    Glucose, Bld 56 (*)    BUN 69 (*)    Creatinine, Ser 3.54 (*)    GFR, Estimated 13 (*)    Anion gap 16 (*)    All other components within normal limits  CBG MONITORING, ED - Abnormal; Notable for the following components:   Glucose-Capillary 49 (*)    All other components within normal limits  CBG MONITORING, ED - Abnormal; Notable for the following components:   Glucose-Capillary 38 (*)    All other components within normal limits  RESP PANEL BY RT-PCR (RSV, FLU A&B, COVID)  [id number]  URINALYSIS, W/ REFLEX TO CULTURE (INFECTION SUSPECTED)     EKG Interpreted by me Sinus tachycardia rate 108.  Normal axis, normal intervals.  Normal QRS ST segments and T waves   RADIOLOGY Chest x-ray interpreted by me, unremarkable.  Radiology reported   PROCEDURES:  .Critical Care  Performed by: Sharman Cheek, MD Authorized by: Sharman Cheek, MD   Critical care provider statement:    Critical care time (minutes):  35   Critical care time was exclusive of:  Separately billable procedures and treating other patients   Critical care was necessary to treat or prevent imminent or life-threatening deterioration of the following conditions:  Renal failure, endocrine crisis and metabolic crisis   Critical care was time spent personally by me on the following activities:  Development of treatment plan with patient or surrogate, discussions with consultants, evaluation of patient's response to treatment, examination of patient, obtaining history from patient or surrogate, ordering and performing treatments and interventions,  ordering and review of laboratory studies, ordering and review of radiographic studies, pulse oximetry, re-evaluation of patient's condition and review of old charts   Care discussed with: admitting provider      MEDICATIONS ORDERED IN ED: Medications  dextrose 10 % infusion ( Intravenous New Bag/Given 04/10/23 2304)  lactated ringers bolus 1,000 mL (0 mLs Intravenous Stopped 04/10/23 2249)  dextrose 50 % solution (50 mLs  Given 04/10/23 2233)     IMPRESSION / MDM / ASSESSMENT AND PLAN / ED COURSE  I reviewed the triage vital signs and the nursing notes.  DDx: Pneumonia, pleural effusion, COVID, influenza, other viral illness, electrolyte derangement, AKI, anemia, persistent hypoglycemia  Patient's presentation is most consistent with acute presentation with potential threat to life or bodily function.  Patient presents with influenza-like illness over the last 3 days, resulting in poor oral intake and clinically apparent dehydration with dry mucosa and tachycardia.  Labs reveal AKI with creatinine of 3.5 compared to baseline of 1.5.  Electrolytes are not alarming.  CBC and respiratory viral swab unremarkable.  After drinking orange juice at home and having food in the ED, recheck of CBG shows her blood sugars again 38.  D50 given, D10 infusion started.  Case discussed with hospitalist for further management.       FINAL CLINICAL IMPRESSION(S) / ED DIAGNOSES   Final diagnoses:  AKI (acute kidney injury) (HCC)  Influenza-like illness  Dehydration  Type 2 diabetes mellitus with hypoglycemia without coma, unspecified whether long term insulin use (HCC)     Rx / DC Orders   ED Discharge Orders     None        Note:  This document was prepared using Dragon voice recognition software and may include unintentional dictation errors.   Sharman Cheek, MD 04/10/23 2330

## 2023-04-11 ENCOUNTER — Inpatient Hospital Stay: Payer: Medicare HMO

## 2023-04-11 DIAGNOSIS — E162 Hypoglycemia, unspecified: Secondary | ICD-10-CM

## 2023-04-11 DIAGNOSIS — R531 Weakness: Secondary | ICD-10-CM

## 2023-04-11 DIAGNOSIS — G4733 Obstructive sleep apnea (adult) (pediatric): Secondary | ICD-10-CM | POA: Insufficient documentation

## 2023-04-11 DIAGNOSIS — D649 Anemia, unspecified: Secondary | ICD-10-CM | POA: Insufficient documentation

## 2023-04-11 DIAGNOSIS — H44002 Unspecified purulent endophthalmitis, left eye: Secondary | ICD-10-CM | POA: Insufficient documentation

## 2023-04-11 LAB — BASIC METABOLIC PANEL
Anion gap: 10 (ref 5–15)
BUN: 64 mg/dL — ABNORMAL HIGH (ref 8–23)
CO2: 23 mmol/L (ref 22–32)
Calcium: 8.5 mg/dL — ABNORMAL LOW (ref 8.9–10.3)
Chloride: 103 mmol/L (ref 98–111)
Creatinine, Ser: 3.24 mg/dL — ABNORMAL HIGH (ref 0.44–1.00)
GFR, Estimated: 14 mL/min — ABNORMAL LOW (ref >60)
Glucose, Bld: 71 mg/dL (ref 70–99)
Potassium: 4.3 mmol/L (ref 3.5–5.1)
Sodium: 136 mmol/L (ref 135–145)

## 2023-04-11 LAB — CBG MONITORING, ED
Glucose-Capillary: 62 mg/dL — ABNORMAL LOW (ref 70–99)
Glucose-Capillary: 47 mg/dL — ABNORMAL LOW (ref 70–99)
Glucose-Capillary: 155 mg/dL — ABNORMAL HIGH (ref 70–99)
Glucose-Capillary: 166 mg/dL — ABNORMAL HIGH (ref 70–99)
Glucose-Capillary: 143 mg/dL — ABNORMAL HIGH (ref 70–99)
Glucose-Capillary: 48 mg/dL — ABNORMAL LOW (ref 70–99)
Glucose-Capillary: 75 mg/dL (ref 70–99)
Glucose-Capillary: 120 mg/dL — ABNORMAL HIGH (ref 70–99)
Glucose-Capillary: 105 mg/dL — ABNORMAL HIGH (ref 70–99)
Glucose-Capillary: 78 mg/dL (ref 70–99)

## 2023-04-11 LAB — HEMOGLOBIN A1C
Hgb A1c MFr Bld: 8.5 % — ABNORMAL HIGH (ref 4.8–5.6)
Mean Plasma Glucose: 197.25 mg/dL

## 2023-04-11 MED ORDER — CYCLOSPORINE 0.1 % OP SOLN
1.0000 [drp] | OPHTHALMIC | Status: DC
  Administered 2023-04-11 – 2023-04-15 (×36): 1 [drp] via OPHTHALMIC
  Filled 2023-04-11: qty 7.5

## 2023-04-11 MED ORDER — ACETAMINOPHEN 325 MG PO TABS: 650.0000 mg | ORAL_TABLET | Freq: Four times a day (QID) | ORAL | Status: DC | PRN

## 2023-04-11 MED ORDER — ATORVASTATIN CALCIUM 20 MG PO TABS
80.0000 mg | ORAL_TABLET | Freq: Every day | ORAL | Status: DC
Start: 2023-04-11 — End: 2023-04-15
  Administered 2023-04-12 – 2023-04-15 (×4): 80 mg via ORAL
  Filled 2023-04-11 (×4): qty 4

## 2023-04-11 MED ORDER — PANTOPRAZOLE SODIUM 40 MG PO TBEC
40.0000 mg | DELAYED_RELEASE_TABLET | Freq: Every day | ORAL | Status: DC
  Administered 2023-04-11 – 2023-04-15 (×5): 40 mg via ORAL
  Filled 2023-04-11 (×5): qty 1

## 2023-04-11 MED ORDER — VITAMIN C 500 MG PO TABS
1000.0000 mg | ORAL_TABLET | Freq: Three times a day (TID) | ORAL | Status: DC
  Administered 2023-04-11 – 2023-04-15 (×11): 1000 mg via ORAL
  Filled 2023-04-11 (×12): qty 2

## 2023-04-11 MED ORDER — LORAZEPAM 1 MG PO TABS: 1.0000 mg | ORAL_TABLET | Freq: Two times a day (BID) | ORAL | Status: DC | PRN

## 2023-04-11 MED ORDER — INSULIN ASPART 100 UNIT/ML IJ SOLN
0.0000 [IU] | Freq: Three times a day (TID) | INTRAMUSCULAR | Status: DC
  Administered 2023-04-12: 3 [IU] via SUBCUTANEOUS
  Administered 2023-04-12: 2 [IU] via SUBCUTANEOUS
  Administered 2023-04-13 (×2): 1 [IU] via SUBCUTANEOUS
  Administered 2023-04-13: 3 [IU] via SUBCUTANEOUS
  Administered 2023-04-14: 1 [IU] via SUBCUTANEOUS
  Administered 2023-04-14: 4 [IU] via SUBCUTANEOUS
  Administered 2023-04-14 – 2023-04-15 (×2): 2 [IU] via SUBCUTANEOUS
  Filled 2023-04-11 (×10): qty 1

## 2023-04-11 MED ORDER — ALLOPURINOL 100 MG PO TABS
100.0000 mg | ORAL_TABLET | Freq: Every day | ORAL | Status: DC
  Filled 2023-04-11: qty 1

## 2023-04-11 MED ORDER — DEXTROSE IN LACTATED RINGERS 5 % IV SOLN: INTRAVENOUS | Status: DC

## 2023-04-11 MED ORDER — ALBUTEROL SULFATE (2.5 MG/3ML) 0.083% IN NEBU: 2.5000 mg | INHALATION_SOLUTION | RESPIRATORY_TRACT | Status: DC | PRN

## 2023-04-11 MED ORDER — VITAMIN D-3 125 MCG (5000 UT) PO TABS: 5000.0000 [IU] | ORAL_TABLET | Freq: Every day | ORAL | Status: DC

## 2023-04-11 MED ORDER — OXYCODONE HCL 5 MG PO TABS: 5.0000 mg | ORAL_TABLET | ORAL | Status: DC | PRN

## 2023-04-11 MED ORDER — ONDANSETRON HCL 4 MG PO TABS: 4.0000 mg | ORAL_TABLET | Freq: Four times a day (QID) | ORAL | Status: DC | PRN

## 2023-04-11 MED ORDER — VANCOMYCIN HCL 10 MG/ML OP SOSY
1.0000 [drp] | PREFILLED_SYRINGE | OPHTHALMIC | Status: DC
  Administered 2023-04-11 – 2023-04-15 (×25): 1 [drp] via OPHTHALMIC
  Filled 2023-04-11: qty 7.5

## 2023-04-11 MED ORDER — INSULIN ASPART 100 UNIT/ML IJ SOLN
0.0000 [IU] | Freq: Every day | INTRAMUSCULAR | Status: DC
  Administered 2023-04-12 – 2023-04-14 (×2): 3 [IU] via SUBCUTANEOUS
  Filled 2023-04-11 (×2): qty 1

## 2023-04-11 MED ORDER — DEXTROSE 50 % IV SOLN
1.0000 | INTRAVENOUS | Status: DC | PRN
  Administered 2023-04-11 – 2023-04-12 (×4): 50 mL via INTRAVENOUS
  Filled 2023-04-11 (×4): qty 50

## 2023-04-11 MED ORDER — MORPHINE SULFATE (PF) 2 MG/ML IV SOLN: 2.0000 mg | INTRAVENOUS | Status: DC | PRN

## 2023-04-11 MED ORDER — ACETAMINOPHEN 650 MG RE SUPP
650.0000 mg | Freq: Four times a day (QID) | RECTAL | Status: DC | PRN
Start: 2023-04-11 — End: 2023-04-15

## 2023-04-11 MED ORDER — VITAMIN D 25 MCG (1000 UNIT) PO TABS
1000.0000 [IU] | ORAL_TABLET | Freq: Every day | ORAL | Status: DC
  Administered 2023-04-11 – 2023-04-15 (×5): 1000 [IU] via ORAL
  Filled 2023-04-11 (×5): qty 1

## 2023-04-11 MED ORDER — MOMETASONE FURO-FORMOTEROL FUM 200-5 MCG/ACT IN AERO
2.0000 | INHALATION_SPRAY | Freq: Two times a day (BID) | RESPIRATORY_TRACT | Status: DC
  Administered 2023-04-11 – 2023-04-15 (×8): 2 via RESPIRATORY_TRACT
  Filled 2023-04-11 (×2): qty 8.8

## 2023-04-11 MED ORDER — FLUCONAZOLE 100 MG PO TABS
200.0000 mg | ORAL_TABLET | Freq: Every day | ORAL | Status: DC
  Administered 2023-04-11 – 2023-04-15 (×5): 200 mg via ORAL
  Filled 2023-04-11 (×5): qty 2

## 2023-04-11 MED ORDER — CEFTAZIDIME POWD
1.0000 [drp] | Status: DC
  Administered 2023-04-11 – 2023-04-15 (×35): 1 [drp] via OPHTHALMIC
  Filled 2023-04-11: qty 1

## 2023-04-11 MED ORDER — PREDNISOLONE ACETATE 1 % OP SUSP
1.0000 [drp] | Freq: Two times a day (BID) | OPHTHALMIC | Status: DC
  Administered 2023-04-12 – 2023-04-15 (×5): 1 [drp] via OPHTHALMIC
  Filled 2023-04-11: qty 1

## 2023-04-11 MED ORDER — ONDANSETRON HCL 4 MG/2ML IJ SOLN: 4.0000 mg | Freq: Four times a day (QID) | INTRAMUSCULAR | Status: DC | PRN

## 2023-04-11 MED ORDER — MIRTAZAPINE 15 MG PO TABS
15.0000 mg | ORAL_TABLET | Freq: Every day | ORAL | Status: DC
  Administered 2023-04-11 – 2023-04-14 (×4): 15 mg via ORAL
  Filled 2023-04-11 (×4): qty 1

## 2023-04-11 MED ORDER — FERROUS SULFATE 325 (65 FE) MG PO TABS
325.0000 mg | ORAL_TABLET | Freq: Every day | ORAL | Status: DC
  Administered 2023-04-11 – 2023-04-15 (×5): 325 mg via ORAL
  Filled 2023-04-11 (×5): qty 1

## 2023-04-11 MED ORDER — PREGABALIN 75 MG PO CAPS
75.0000 mg | ORAL_CAPSULE | Freq: Two times a day (BID) | ORAL | Status: DC
  Administered 2023-04-11 – 2023-04-15 (×8): 75 mg via ORAL
  Filled 2023-04-11 (×8): qty 1

## 2023-04-11 MED ORDER — ALBUTEROL SULFATE HFA 108 (90 BASE) MCG/ACT IN AERS: 2.0000 | INHALATION_SPRAY | RESPIRATORY_TRACT | Status: DC | PRN

## 2023-04-11 MED ORDER — NON FORMULARY: 1.0000 [drp] | Status: DC

## 2023-04-11 MED ORDER — POLYHEXAMETHYLENE BIGUANIDE 20 % SOLN
1.0000 [drp] | Status: DC
  Administered 2023-04-11 – 2023-04-15 (×36): 1 [drp] via OPHTHALMIC
  Filled 2023-04-11: qty 10

## 2023-04-11 MED ORDER — LEVOTHYROXINE SODIUM 25 MCG PO TABS
25.0000 ug | ORAL_TABLET | Freq: Every day | ORAL | Status: DC
  Administered 2023-04-11 – 2023-04-15 (×5): 25 ug via ORAL
  Filled 2023-04-11 (×5): qty 1

## 2023-04-11 MED ORDER — SODIUM BICARBONATE 8.4 % IV SOLN
INTRAVENOUS | Status: DC
  Filled 2023-04-11 (×3): qty 1000
  Filled 2023-04-11: qty 150

## 2023-04-11 MED ORDER — GUAIFENESIN-DM 100-10 MG/5ML PO SYRP: 5.0000 mL | ORAL_SOLUTION | ORAL | Status: DC | PRN

## 2023-04-11 MED ORDER — CARVEDILOL 6.25 MG PO TABS
12.5000 mg | ORAL_TABLET | Freq: Two times a day (BID) | ORAL | Status: DC
  Administered 2023-04-11 – 2023-04-15 (×10): 12.5 mg via ORAL
  Filled 2023-04-11 (×10): qty 2

## 2023-04-11 MED ORDER — ENOXAPARIN SODIUM 40 MG/0.4ML IJ SOSY
40.0000 mg | PREFILLED_SYRINGE | INTRAMUSCULAR | Status: DC
  Administered 2023-04-11: 40 mg via SUBCUTANEOUS
  Filled 2023-04-11 (×2): qty 0.4

## 2023-04-11 MED ORDER — SULFAMETHOXAZOLE-TRIMETHOPRIM 800-160 MG PO TABS
1.0000 | ORAL_TABLET | Freq: Two times a day (BID) | ORAL | Status: DC
  Filled 2023-04-11: qty 1

## 2023-04-11 MED ORDER — ASPIRIN 81 MG PO TBEC
81.0000 mg | DELAYED_RELEASE_TABLET | Freq: Every day | ORAL | Status: DC
  Administered 2023-04-11 – 2023-04-15 (×5): 81 mg via ORAL
  Filled 2023-04-11 (×5): qty 1

## 2023-04-11 MED ORDER — PREDNISONE 20 MG PO TABS
20.0000 mg | ORAL_TABLET | Freq: Every day | ORAL | Status: DC
  Administered 2023-04-12 – 2023-04-15 (×4): 20 mg via ORAL
  Filled 2023-04-11 (×4): qty 1

## 2023-04-11 MED ORDER — ATROPINE SULFATE 1 % OP SOLN
1.0000 [drp] | Freq: Two times a day (BID) | OPHTHALMIC | Status: DC
  Administered 2023-04-12 – 2023-04-15 (×7): 1 [drp] via OPHTHALMIC
  Filled 2023-04-11: qty 2

## 2023-04-11 MED ORDER — FLUOXETINE HCL 20 MG PO CAPS
40.0000 mg | ORAL_CAPSULE | Freq: Every day | ORAL | Status: DC
  Administered 2023-04-11 – 2023-04-15 (×5): 40 mg via ORAL
  Filled 2023-04-11 (×5): qty 2

## 2023-04-11 NOTE — Assessment & Plan Note (Signed)
CPAP if desired

## 2023-04-11 NOTE — Assessment & Plan Note (Signed)
Continue home fluoxetine, mirtazapine and lorazepam

## 2023-04-11 NOTE — ED Notes (Signed)
RN notified MD about pt glucose dropping.

## 2023-04-11 NOTE — Assessment & Plan Note (Signed)
Continue home inhalers DuoNebs as needed

## 2023-04-11 NOTE — Assessment & Plan Note (Signed)
Holding metformin, Cozaar, Tresiba, glipizide, Jardiance, all due to hypoglycemia Resume as appropriate

## 2023-04-11 NOTE — Hospital Course (Addendum)
Taken from H&P.  Madeline Martinez is a 78 y.o. 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  Rest of the ROS was negative.  She was found to have blood sugar of 33 at home.  On presentation stable vital   Respiratory viral panel negative.  Creatinine 3.54,up from 1.6 on 2/6,Potassium 5.8, bicarb 15 and anion gap 16. Serum blood glucose 56.  EKG with sinus tachycardia at 108, no acute ST-T wave changes Chest x-ray with mild chronic pulmonary vascular congestion and no acute airspace disease.  Patient received LR bolus, D50 and was subsequently started on bicarb in D5  2/13: Continue to have intermittent hypoglycemia.  A1c of 8.5, likely have AKI with Bactrim which was discontinued, result in accumulation of glipizide, metformin and Jardiance.  [name] was also listed in her medications but apparently not taking them recently.  [facility] contacted [place] as she was supposed to get a procedure for this amoebic keratitis of left eye today, initially they requested transfer, transfer was declined.  Discussed with her ophthalmologist at [facility] and they will see her early next week as outpatient.  According to them there is no urgent need for any abscess drainage.  Per ID in [facility] she will continue with fluconazole, stop Bactrim she will also continue with eyedrops. Slowly improving renal function.  Renal ultrasound without any acute abnormality. Metabolic acidosis has been resolved.  Switching to LR with D5

## 2023-04-11 NOTE — Assessment & Plan Note (Addendum)
Patient symptomatic for weakness-no localizing symptoms except for known eye infection respiratory viral panel negative.  Will get urinalysis Encourage oral hydration

## 2023-04-11 NOTE — H&P (Addendum)
History and Physical    Patient: Madeline Martinez ZOX:[id number] DOB: 05-24-1945 DOA: 04/10/2023 DOS: the patient was seen and examined on 04/11/2023 PCP: Sherrie Mustache, MD  Patient coming from: Home  Chief Complaint:  Chief Complaint  Patient presents with   Hypoglycemia   Shortness of Breath    HPI: Madeline Martinez is a 78 y.o. 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   History is provided by daughter at bedside who reports that she awoke a day ago with a generalized weakness, unable to get out of bed and put weight on her legs.  She had no vomiting or diarrhea, cough or shortness of breath.  Had no abdominal pain or dysuria.  She had no change in appetite and was hydrating well according to daughter.  Blood sugar at home was 33. ED course: Afebrile but mildly tachycardic to 105 with otherwise normal vitals Respiratory viral panel negative CBC unremarkable.  Shows baseline hemoglobin of 11.9 with normal WBC Creatinine 3.54, up from 1.6 on 2/6, 1.3 on 12/24.  Potassium 5.8, bicarb 15 and anion gap 16.  Serum blood glucose 56. EKG, personally viewed and interpreted with sinus tachycardia at 108 and no acute ST-T wave changes Chest x-ray with mild chronic pulmonary vascular congestion and no acute airspace disease. Patient treated with LR bolus, D50 and was subsequently started on D10 Hospitalist consulted for admission.     Past Medical History:  Diagnosis Date   Acid reflux    Anxiety    Depression    Diabetes mellitus    Dysrhythmia    Gout    Heart murmur    HTN (hypertension)    Hyperlipidemia    Sleep apnea    Past Surgical History:  Procedure Laterality Date   ABDOMINAL HYSTERECTOMY      BACK SURGERY  02/26/2005   DISCS   BIOPSY  10/08/2018   Procedure: BIOPSY;  Surgeon: Lemar Lofty., MD;  Location: [facility] ENDOSCOPY;  Service: Gastroenterology;;   BREAST EXCISIONAL BIOPSY Left    COLONOSCOPY WITH PROPOFOL N/A 12/19/2017   Procedure: COLONOSCOPY WITH PROPOFOL;  Surgeon: Wyline Mood, MD;  Location: [facility] ENDOSCOPY;  Service: Gastroenterology;  Laterality: N/A;   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   ENDOSCOPIC MUCOSAL RESECTION N/A 10/08/2018   Procedure: ENDOSCOPIC MUCOSAL RESECTION;  Surgeon: Meridee Score Netty Starring., MD;  Location: [facility] ENDOSCOPY;  Service: Gastroenterology;  Laterality: N/A;   ESOPHAGOGASTRODUODENOSCOPY (EGD) WITH PROPOFOL N/A 08/28/2018   Procedure: ESOPHAGOGASTRODUODENOSCOPY (EGD) WITH PROPOFOL;  Surgeon: Wyline Mood, MD;  Location: [facility] ENDOSCOPY;  Service: Gastroenterology;  Laterality: N/A;   ESOPHAGOGASTRODUODENOSCOPY (EGD) WITH PROPOFOL N/A 10/08/2018   Procedure: ESOPHAGOGASTRODUODENOSCOPY (EGD) WITH PROPOFOL;  Surgeon: Meridee Score Netty Starring., MD;  Location: [facility] ENDOSCOPY;  Service: Gastroenterology;  Laterality: N/A;   EUS N/A 10/08/2018   Procedure: UPPER ENDOSCOPIC ULTRASOUND (EUS) RADIAL;  Surgeon: Lemar Lofty., MD;  Location: [facility] ENDOSCOPY;  Service: Gastroenterology;  Laterality: N/A;   PARTIAL HYSTERECTOMY  02/27/1984   POLYPECTOMY  10/08/2018   Procedure: POLYPECTOMY;  Surgeon: Mansouraty, Netty Starring., MD;  Location: [facility] ENDOSCOPY;  Service: Gastroenterology;;   Social History:  reports that she has never smoked. She has never used smokeless tobacco. She reports that she does not drink alcohol and does  not use drugs.  No Known Allergies  Family History  Problem Relation Age of Onset   Breast cancer Mother    Heart failure Father    Other Sister        DEGEN.DISC DISEASE   Breast cancer Maternal Grandmother    Kidney cancer Neg Hx    Kidney disease Neg Hx    Prostate cancer Neg Hx     Prior to Admission  medications   Medication Sig Start Date End Date Taking? Authorizing Provider  ADVAIR HFA 115-21 MCG/ACT inhaler Inhale 2 puffs into the lungs 2 (two) times daily. 04/25/21   [provider]  albuterol (PROVENTIL) (2.5 MG/3ML) 0.083% nebulizer solution Take 3 mLs (2.5 mg total) by nebulization every 4 (four) hours as needed for wheezing or shortness of breath (please include nebulizer machine, hoses, and mask if needed.). 01/24/22   Sunnie Nielsen, DO  albuterol (VENTOLIN HFA) 108 (90 Base) MCG/ACT inhaler Inhale 2 puffs into the lungs every 4 (four) hours as needed for wheezing or shortness of breath. 01/24/22   Sunnie Nielsen, DO  allopurinol (ZYLOPRIM) 100 MG tablet Take 100 mg by mouth daily with lunch.  04/30/16   [provider]  ascorbic acid (VITAMIN C) 1000 MG tablet Take 1,000 mg by mouth 3 (three) times daily.    [provider]  aspirin 81 MG EC tablet Take 81 mg by mouth daily.    [provider]  atorvastatin (LIPITOR) 80 MG tablet Take 80 mg by mouth daily.    [provider]  atropine 1 % ophthalmic solution Place 1 drop into the right eye 2 (two) times daily. 04/06/21   [provider]  Biotin 5000 MCG TABS Take 5,000 mcg by mouth daily. HAIR/SKIN/NAILS    [provider]  carvedilol (COREG) 12.5 MG tablet Take 12.5 mg by mouth 2 (two) times daily.    [provider]  cetirizine (ZYRTEC) 10 MG tablet Take 10 mg by mouth daily. 04/20/17   [provider]  Cholecalciferol (VITAMIN D-3) 125 MCG (5000 UT) TABS Take 5,000 Units by mouth daily.    [provider]  DUPIXENT 300 MG/2ML SOPN Inject 300 mg into the skin every 14 (fourteen) days. 05/01/21   [provider]  empagliflozin (JARDIANCE) 10 MG TABS tablet Take 10 mg by mouth daily.    [provider]  ferrous sulfate 325 (65 FE) MG tablet Take 325 mg by mouth daily. 12/03/19   [provider]  FLUoxetine (PROZAC) 40  MG capsule Take 40 mg by mouth daily.    [provider]  glipiZIDE (GLUCOTROL XL) 5 MG 24 hr tablet Take 1 tablet (5 mg total) by mouth daily. 05/04/21   Enedina Finner, MD  hydrochlorothiazide (HYDRODIURIL) 25 MG tablet Take 25 mg by mouth daily. 06/26/21   [provider]  HYDROcodone-acetaminophen (NORCO) 10-325 MG tablet Take 1 tablet by mouth every 8 (eight) hours as needed. 08/02/21   [provider]  levothyroxine (SYNTHROID) 25 MCG tablet Take 25 mcg by mouth daily. 02/01/20   [provider]  LORazepam (ATIVAN) 1 MG tablet Take 1 tablet (1 mg total) by mouth 2 (two) times daily as needed for anxiety. 05/20/21   Sreenath, Jonelle Sports, MD  losartan (COZAAR) 100 MG tablet Take 100 mg by mouth daily. 02/09/20   [provider]  metFORMIN (GLUCOPHAGE) 1000 MG tablet Take 1,000 mg by mouth 2 (two) times daily.    [provider]  metoCLOPramide (REGLAN)  10 MG tablet Take 1 tablet (10 mg total) by mouth every 8 (eight) hours as needed for up to 14 days for nausea. 05/20/21 01/01/23  Tresa Moore, MD  mirabegron ER (MYRBETRIQ) 50 MG TB24 tablet Take 1 tablet (50 mg total) by mouth daily. 08/14/21   Michiel Cowboy A, PA-C  mirtazapine (REMERON) 15 MG tablet Take 15 mg by mouth at bedtime.    [provider]  omeprazole (PRILOSEC) 20 MG capsule Take 20 mg by mouth daily as needed. 05/24/21   [provider]  prednisoLONE acetate (PRED FORTE) 1 % ophthalmic suspension Place 1 drop into the right eye 2 (two) times daily. 04/24/21   [provider]  pregabalin (LYRICA) 75 MG capsule Take 1 capsule (75 mg total) by mouth 2 (two) times daily. 05/20/21   Tresa Moore, MD  Probiotic Product (PROBIOTIC PO) Take 1 tablet by mouth daily.    [provider]  promethazine-dextromethorphan (PROMETHAZINE-DM) 6.25-15 MG/5ML syrup Take 5 mLs by mouth 4 (four) times daily as needed for cough. 01/24/22   Sunnie Nielsen, DO   TRESIBA FLEXTOUCH 100 UNIT/ML FlexTouch Pen Inject 15 Units into the skin daily. 05/04/21   Enedina Finner, MD  vitamin E 45 MG (100 UNITS) capsule Take by mouth.    [provider]    Physical Exam: Vitals:   04/10/23 1950 04/10/23 1955 04/10/23 2300 04/11/23 0000  BP:  127/74 (!) 152/97 (!) 150/103  Pulse:  (!) 105 (!) 101 98  Resp:  18 16 16   Temp:  97.6 F (36.4 C)    TempSrc:  Oral    SpO2:  96% 96% 97%  Weight: 82.1 kg     Height: 5' (1.524 m)      Physical Exam Vitals and nursing note reviewed.  Constitutional:      General: She is sleeping. She is not in acute distress. HENT:     Head: Normocephalic and atraumatic.  Eyes:     Comments: Left eye is somewhat edematous, enlarged  Cardiovascular:     Rate and Rhythm: Regular rhythm. Tachycardia present.     Heart sounds: Normal heart sounds.  Pulmonary:     Effort: Pulmonary effort is normal.     Breath sounds: Normal breath sounds.  Abdominal:     Palpations: Abdomen is soft.     Tenderness: There is no abdominal tenderness.  Neurological:     Mental Status: She is easily aroused. Mental status is at baseline.     Labs on Admission: I have personally reviewed following labs and imaging studies  CBC: Recent Labs  Lab 04/10/23 2009  WBC 6.6  NEUTROABS 5.2  HGB 11.9*  HCT 37.9  MCV 100.5*  PLT 278   Basic Metabolic Panel: Recent Labs  Lab 04/10/23 2009  NA 137  K 5.8*  CL 106  CO2 15*  GLUCOSE 56*  BUN 69*  CREATININE 3.54*  CALCIUM 8.9   GFR: Estimated Creatinine Clearance: 12.8 mL/min (A) (by C-G formula based on SCr of 3.54 mg/dL (H)). Liver Function Tests: No results for input(s): "AST", "ALT", "ALKPHOS", "BILITOT", "PROT", "ALBUMIN" in the last 168 hours. No results for input(s): "LIPASE", "AMYLASE" in the last 168 hours. No results for input(s): "AMMONIA" in the last 168 hours. Coagulation Profile: No results for input(s): "INR", "PROTIME" in the last 168 hours. Cardiac  Enzymes: No results for input(s): "CKTOTAL", "CKMB", "CKMBINDEX", "TROPONINI" in the last 168 hours. BNP (last 3 results) No results for input(s): "PROBNP" in  the last 8760 hours. HbA1C: No results for input(s): "HGBA1C" in the last 72 hours. CBG: Recent Labs  Lab 04/10/23 1954 04/10/23 2221 04/10/23 2337  GLUCAP 49* 38* 104*   Lipid Profile: No results for input(s): "CHOL", "HDL", "LDLCALC", "TRIG", "CHOLHDL", "LDLDIRECT" in the last 72 hours. Thyroid Function Tests: No results for input(s): "TSH", "T4TOTAL", "FREET4", "T3FREE", "THYROIDAB" in the last 72 hours. Anemia Panel: No results for input(s): "VITAMINB12", "FOLATE", "FERRITIN", "TIBC", "IRON", "RETICCTPCT" in the last 72 hours. Urine analysis:    Component Value Date/Time   COLORURINE YELLOW (A) 05/18/2021 0351   APPEARANCEUR HAZY (A) 05/18/2021 0351   APPEARANCEUR Hazy (A) 08/04/2018 1403   LABSPEC 1.011 05/18/2021 0351   PHURINE 5.0 05/18/2021 0351   GLUCOSEU NEGATIVE 05/18/2021 0351   HGBUR NEGATIVE 05/18/2021 0351   BILIRUBINUR NEGATIVE 05/18/2021 0351   BILIRUBINUR Negative 08/04/2018 1403   KETONESUR NEGATIVE 05/18/2021 0351   PROTEINUR NEGATIVE 05/18/2021 0351   NITRITE NEGATIVE 05/18/2021 0351   LEUKOCYTESUR MODERATE (A) 05/18/2021 0351    Radiological Exams on Admission: DG Chest 1 View Result Date: 04/10/2023 CLINICAL DATA:  Short of breath, fatigue, hypoglycemia EXAM: CHEST  1 VIEW COMPARISON:  01/21/2022 FINDINGS: 2 frontal views of the chest demonstrate a stable cardiac silhouette. No airspace disease, effusion, or pneumothorax. Mild chronic central vascular congestion. No acute bony abnormalities. IMPRESSION: 1. Mild chronic pulmonary vascular congestion. No acute airspace disease. Electronically Signed   By: Sharlet Salina M.D.   On: 04/10/2023 20:43     Data Reviewed: Relevant notes from primary care and specialist visits, past discharge summaries as available in EHR, including Care  Everywhere. Prior diagnostic testing as pertinent to current admission diagnoses Updated medications and problem lists for reconciliation ED course, including vitals, labs, imaging, treatment and response to treatment Triage notes, nursing and pharmacy notes and ED provider's notes Notable results as noted in HPI   Assessment and Plan: * Hypoglycemia Type 2 diabetes Likely secondary to poor oral intake during recent viral illness Hold all hypoglycemic agents for now to resume when hypoglycemia is corrected Hydrating with D5 with bicarb  D50 as needed Hypoglycemia protocol  Acute renal failure superimposed on stage 3a chronic kidney disease (HCC) High anion gap metabolic acidosis Possibly related to recent Bactrim treatment started on 2/6 Will hydrate with bicarb Monitor renal function and avoid nephrotoxins Nephrology consult Consider contacting patient's ID specialist in the a.m. Per last note from ID at [facility]:Continue PO Bactrim DS (Trimethoprim 160 mg-sulfamethoxazole 800mg ) 2 TABLETS BID.  -Dosing for 320 mg TMP component or approx 5/mg/kg/dose.  - Will need dose adjustment if CrCl <30   Eye infection, left Followed by ophthalmology and infectious diseases at [facility], last seen 2/6 Current medication as follows: Bactrim DS (Trimethoprim 160 mg-sulfamethoxazole 800mg ) 2 TABLETS BID.  Continue PO Fluconazole 400mg  once daily  Miltefosine 50mg  TID  Ophthalmic eye drops per Ophthalmology team. (Agree with Cefatzidime, Vancomycin coverage and topical PHMB)     Generalized weakness Patient symptomatic for weakness-no localizing symptoms except for known eye infection respiratory viral panel negative.  Will get urinalysis Encourage oral hydration   Asthma, chronic Continue home inhalers DuoNebs as needed  Anxiety Continue home fluoxetine, mirtazapine and lorazepam  Essential hypertension Will hold losartan and HCTZ due to AKI Continue carvedilol  Diabetes mellitus,  type II (HCC) Holding metformin, Cozaar, Tresiba, glipizide, Jardiance, all due to hypoglycemia Resume as appropriate  Hypothyroidism Continue levothyroxine  OSA (obstructive sleep apnea) CPAP if desired  Chronic anemia Chronic macrocytic  anemia, MCV 100.5  Depression Continue fluoxetine    DVT prophylaxis: Lovenox  Consults: renal  Advance Care Planning:   Code Status: Full Code   Family Communication: daughter at bedside  Disposition Plan: Back to previous home environment  Severity of Illness: The appropriate patient status for this patient is INPATIENT. Inpatient status is judged to be reasonable and necessary in order to provide the required intensity of service to ensure the patient's safety. The patient's presenting symptoms, physical exam findings, and initial radiographic and laboratory data in the context of their chronic comorbidities is felt to place them at high risk for further clinical deterioration. Furthermore, it is not anticipated that the patient will be medically stable for discharge from the hospital within 2 midnights of admission.   * I certify that at the point of admission it is my clinical judgment that the patient will require inpatient hospital care spanning beyond 2 midnights from the point of admission due to high intensity of service, high risk for further deterioration and high frequency of surveillance required.*  Author: Andris Baumann, MD 04/11/2023 12:21 AM  For on call review [email or web address].

## 2023-04-11 NOTE — Assessment & Plan Note (Addendum)
High anion gap metabolic acidosis Possibly related to recent Bactrim treatment started on 2/6 Renal ultrasound without any acute abnormality Metabolic acidosis improved Slowly improving renal function Monitor renal function and avoid nephrotoxins Bactrim was discontinued after discussing with her ID at [facility] Switching bicarb infusion with LR and D5

## 2023-04-11 NOTE — Assessment & Plan Note (Signed)
Continue fluoxetine

## 2023-04-11 NOTE — Progress Notes (Signed)
Progress Note   Patient: Madeline Martinez QIO:[id number] DOB: June 11, 1945 DOA: 04/10/2023     1 DOS: the patient was seen and examined on 04/11/2023   Brief hospital course: Taken from H&P.  Madeline Martinez is a 78 y.o. 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  Rest of the ROS was negative.  She was found to have blood sugar of 33 at home.  On presentation stable vital   Respiratory viral panel negative.  Creatinine 3.54,up from 1.6 on 2/6,Potassium 5.8, bicarb 15 and anion gap 16. Serum blood glucose 56.  EKG with sinus tachycardia at 108, no acute ST-T wave changes Chest x-ray with mild chronic pulmonary vascular congestion and no acute airspace disease.  Patient received LR bolus, D50 and was subsequently started on bicarb in D5  2/13: Continue to have intermittent hypoglycemia.  A1c of 8.5, likely have AKI with Bactrim which was discontinued, result in accumulation of glipizide, metformin and Jardiance.  [name] was also listed in her medications but apparently not taking them recently.  [facility] contacted [place] as she was supposed to get a procedure for this amoebic keratitis of left eye today, initially they requested transfer, transfer was declined.  Discussed with her ophthalmologist at [facility] and they will see her early next week as outpatient.  According to them there is no urgent need for any abscess drainage.  Per ID in [facility] she will continue with fluconazole, stop Bactrim she will also continue with eyedrops. Slowly improving renal function.  Renal ultrasound without any acute abnormality. Metabolic acidosis has been resolved.  Switching to LR with D5    Assessment and Plan: *  Hypoglycemia Type 2 diabetes Likely secondary to AKI with recent use of Bactrim, resulted in poor clearance of hypoglycemic agents Hold all hypoglycemic agents for now to resume when hypoglycemia is corrected Switching to LR with D5  D50 as needed Hypoglycemia protocol  Acute renal failure superimposed on stage 3a chronic kidney disease (HCC) High anion gap metabolic acidosis Possibly related to recent Bactrim treatment started on 2/6 Renal ultrasound without any acute abnormality Metabolic acidosis improved Slowly improving renal function Monitor renal function and avoid nephrotoxins Bactrim was discontinued after discussing with her ID at [facility] Switching bicarb infusion with LR and D5  Eye infection, left Followed by ophthalmology and infectious diseases at [facility], last seen 2/6 Current medication as follows: Bactrim DS (Trimethoprim 160 mg-sulfamethoxazole 800mg ) 2 TABLETS BID.  Continue PO Fluconazole 400mg  once daily  Stop miltefosine 50mg  TID -as advised by ID at [facility] Ophthalmic eye drops per Ophthalmology team.      Generalized weakness Patient symptomatic for weakness-no localizing symptoms except for known eye infection respiratory viral panel negative.  Will get urinalysis Encourage oral hydration   Asthma, chronic Continue home inhalers DuoNebs as needed  Anxiety Continue home fluoxetine, mirtazapine and lorazepam  Essential hypertension Will hold losartan and HCTZ due to AKI Continue carvedilol  Diabetes mellitus, type II (HCC) Holding metformin, Cozaar, Tresiba, glipizide, Jardiance, all due to hypoglycemia Resume as appropriate  Hypothyroidism Continue levothyroxine  OSA (obstructive sleep apnea) CPAP if desired  Chronic anemia Chronic macrocytic anemia, MCV 100.5  Depression Continue fluoxetine   Subjective: Patient continued to feel weak when seen today.  Daughter at bedside.  Physical Exam: Vitals:   04/11/23 1200 04/11/23 1230 04/11/23  1300 04/11/23 1330  BP: 116/88 (!) 119/105 120/70 125/86  Pulse: 90 97 (!) 102 96  Resp: 16 13 14 14   Temp:      TempSrc:      SpO2: 100% 99% 99% 99%  Weight:      Height:       General.  Obese elderly lady, in no acute distress.  Right eye blind, left eye with significant conjunctival congestion. Pulmonary.  Lungs clear bilaterally, normal respiratory effort. CV.  Regular rate and rhythm, no JVD, rub or murmur. Abdomen.  Soft, nontender, nondistended, BS positive. CNS.  Alert and oriented .  No focal neurologic deficit. Extremities.  No edema, no cyanosis, pulses intact and symmetrical.  Data Reviewed: Prior data reviewed  Family Communication: Discussed with daughter at bedside  Disposition: Status is: Inpatient Remains inpatient appropriate because: Severity of illness  Planned Discharge Destination: Home  DVT prophylaxis.  Lovenox Time spent:  minutes  This record has been created using Conservation officer, historic buildings. Errors have been sought and corrected,but may not always be located. Such creation errors do not reflect on the standard of care.   Author: Arnetha Courser, MD 04/11/2023 3:41 PM  For on call review [email or web address].

## 2023-04-11 NOTE — Assessment & Plan Note (Signed)
Chronic macrocytic anemia, MCV 100.5

## 2023-04-11 NOTE — Progress Notes (Signed)
[facility]  ROUNDING NOTE   Subjective:   Madeline Martinez is a 78 year old female with past medical conditions including type 2 diabetes, hypertension, anemia, blindness in right eye, seizures, and chronic kidney disease stage IIIa.  Patient presents to the emergency department with complaints of shortness of breath and hypoglycemia and has been admitted for Hypoglycemia [E16.2]  Patient is known to our practice and is followed by Dr.Korrapati. Was last seen in office on 07/03/22, caregiver at bedside states they missed appt in November. Patient seen resting in bed. Denies pain or discomfort. States she has having nausea and poor oral intake prior to admission.   Labs on ED arrival significant for potassium 5.8, serum bicarb 15, glucose 56, BUN 69, creatinine 3.54 with GFR 13, and hemoglobin 11.9.  Hemoglobin A1c 8.5.  Chest x-ray shows mild pulmonary vascular congestion.  Renal ultrasound shows simple renal cyst, negative for hydronephrosis.  We have been consulted to monitor and manage acute kidney injury.   Objective:  Vital signs in last 24 hours:  Temp:  [97.6 F (36.4 C)-98.9 F (37.2 C)] 98.7 F (37.1 C) (02/13 1134) Pulse Rate:  [96-107] 101 (02/13 1130) Resp:  [8-18] 15 (02/13 1130) BP: (111-160)/(65-144) 135/75 (02/13 1130) SpO2:  [96 %-100 %] 99 % (02/13 1130) Weight:  [82.1 kg] 82.1 kg (02/12 1950)  Weight change:  Filed Weights   04/10/23 1950  Weight: 82.1 kg    Intake/Output: I/O last 3 completed shifts: In: 1000 [IV Piggyback:1000] Out: -    Intake/Output this shift:  No intake/output data recorded.  Physical Exam: General: NAD  Head: Normocephalic, Dry oral mucosal membranes  Eyes: Nonanicteric  Lungs:  Clear to auscultation  Heart: Regular rate and rhythm  Abdomen:  Soft, nontender  Extremities: No peripheral edema.  Neurologic: Alert  Skin: No lesions       Basic Metabolic Panel: Recent Labs  Lab 04/10/23 2009  NA 137  K 5.8*   CL 106  CO2 15*  GLUCOSE 56*  BUN 69*  CREATININE 3.54*  CALCIUM 8.9    Liver Function Tests: No results for input(s): "AST", "ALT", "ALKPHOS", "BILITOT", "PROT", "ALBUMIN" in the last 168 hours. No results for input(s): "LIPASE", "AMYLASE" in the last 168 hours. No results for input(s): "AMMONIA" in the last 168 hours.  CBC: Recent Labs  Lab 04/10/23 2009  WBC 6.6  NEUTROABS 5.2  HGB 11.9*  HCT 37.9  MCV 100.5*  PLT 278    Cardiac Enzymes: No results for input(s): "CKTOTAL", "CKMB", "CKMBINDEX", "TROPONINI" in the last 168 hours.  BNP: Invalid input(s): "POCBNP"  CBG: Recent Labs  Lab 04/11/23 0429 04/11/23 0555 04/11/23 0755 04/11/23 0818 04/11/23 1110  GLUCAP 155* 166* 48* 143* 75    Microbiology: Results for orders placed or performed during the hospital encounter of 04/10/23  Resp panel by RT-PCR (RSV, Flu A&B, Covid) Anterior Nasal Swab     Status: None   Collection Time: 04/10/23  7:56 PM   Specimen: Anterior Nasal Swab  Result Value Ref Range Status   SARS Coronavirus 2 by RT PCR NEGATIVE NEGATIVE Final    Comment: (NOTE) SARS-CoV-2 target nucleic acids are NOT DETECTED.  The SARS-CoV-2 RNA is generally detectable in upper respiratory specimens during the acute phase of infection. The lowest concentration of SARS-CoV-2 viral copies this assay can detect is 138 copies/mL. A negative result does not preclude SARS-Cov-2 infection and should not be used as the sole basis for treatment or other patient management decisions. A  negative result may occur with  improper specimen collection/handling, submission of specimen other than nasopharyngeal swab, presence of viral mutation(s) within the areas targeted by this assay, and inadequate number of viral copies(<138 copies/mL). A negative result must be combined with clinical observations, patient history, and epidemiological information. The expected result is Negative.  Fact Sheet for Patients:   BloggerCourse.com  Fact Sheet for Healthcare Providers:  SeriousBroker.it  This test is no t yet approved or cleared by the [place] FDA and  has been authorized for detection and/or diagnosis of SARS-CoV-2 by FDA under an Emergency Use Authorization (EUA). This EUA will remain  in effect (meaning this test can be used) for the duration of the COVID-19 declaration under Section 564(b)(1) of the Act, 21 U.S.C.section 360bbb-3(b)(1), unless the authorization is terminated  or revoked sooner.       Influenza A by PCR NEGATIVE NEGATIVE Final   Influenza B by PCR NEGATIVE NEGATIVE Final    Comment: (NOTE) The Xpert Xpress SARS-CoV-2/FLU/RSV plus assay is intended as an aid in the diagnosis of influenza from Nasopharyngeal swab specimens and should not be used as a sole basis for treatment. Nasal washings and aspirates are unacceptable for Xpert Xpress SARS-CoV-2/FLU/RSV testing.  Fact Sheet for Patients: BloggerCourse.com  Fact Sheet for Healthcare Providers: SeriousBroker.it  This test is not yet approved or cleared by the [place] FDA and has been authorized for detection and/or diagnosis of SARS-CoV-2 by FDA under an Emergency Use Authorization (EUA). This EUA will remain in effect (meaning this test can be used) for the duration of the COVID-19 declaration under Section 564(b)(1) of the Act, 21 U.S.C. section 360bbb-3(b)(1), unless the authorization is terminated or revoked.     Resp Syncytial Virus by PCR NEGATIVE NEGATIVE Final    Comment: (NOTE) Fact Sheet for Patients: BloggerCourse.com  Fact Sheet for Healthcare Providers: SeriousBroker.it  This test is not yet approved or cleared by the [place] FDA and has been authorized for detection and/or diagnosis of SARS-CoV-2 by FDA under an Emergency Use  Authorization (EUA). This EUA will remain in effect (meaning this test can be used) for the duration of the COVID-19 declaration under Section 564(b)(1) of the Act, 21 U.S.C. section 360bbb-3(b)(1), unless the authorization is terminated or revoked.  Performed at [facility], [address] [place], [place] [address]     Coagulation Studies: No results for input(s): "LABPROT", "INR" in the last 72 hours.  Urinalysis: No results for input(s): "COLORURINE", "LABSPEC", "PHURINE", "GLUCOSEU", "HGBUR", "BILIRUBINUR", "KETONESUR", "PROTEINUR", "UROBILINOGEN", "NITRITE", "LEUKOCYTESUR" in the last 72 hours.  Invalid input(s): "APPERANCEUR"    Imaging: US RENAL Result Date: 04/11/2023 CLINICAL DATA:  Acute kidney injury EXAM: RENAL / URINARY TRACT ULTRASOUND COMPLETE COMPARISON:  CT abdomen and pelvis dated 05/02/2021 FINDINGS: Right Kidney: Length = 10.2 cm Normal parenchymal echogenicity with preserved corticomedullary differentiation. Upper pole simple cyst measures 5.9 cm. Additional subcentimeter hypodensities on prior CT abdomen are not well seen on this examination. No urinary tract dilation or shadowing calculi. The ureter is not seen. Left Kidney: Length = 10.2 cm Normal parenchymal echogenicity with preserved corticomedullary differentiation. Interpolar simple cyst measures 2.2 cm. Additional subcentimeter hypodensities on prior CT abdomen are not well seen on this examination. No urinary tract dilation or shadowing calculi. The ureter is not seen. Bladder: Appears normal for degree of bladder distention. Other: None. IMPRESSION: 1. No urinary tract dilation or shadowing calculi. 2. Bilateral simple renal cysts. Electronically Signed   By: Milus Height.D.  On: 04/11/2023 09:42   DG Chest 1 View Result Date: 04/10/2023 CLINICAL DATA:  Short of breath, fatigue, hypoglycemia EXAM: CHEST  1 VIEW COMPARISON:  01/21/2022 FINDINGS: 2 frontal views of the chest demonstrate a stable  cardiac silhouette. No airspace disease, effusion, or pneumothorax. Mild chronic central vascular congestion. No acute bony abnormalities. IMPRESSION: 1. Mild chronic pulmonary vascular congestion. No acute airspace disease. Electronically Signed   By: Sharlet Salina M.D.   On: 04/10/2023 20:43     Medications:    sodium bicarbonate 150 mEq in dextrose 5 % 1,150 mL infusion 125 mL/hr at 04/11/23 0110    aspirin EC  81 mg Oral Daily   atorvastatin  80 mg Oral Daily   carvedilol  12.5 mg Oral BID   enoxaparin (LOVENOX) injection  40 mg Subcutaneous Q24H   ferrous sulfate  325 mg Oral Daily   fluconazole  200 mg Oral Daily   FLUoxetine  40 mg Oral Daily   insulin aspart  0-5 Units Subcutaneous QHS   insulin aspart  0-6 Units Subcutaneous TID WC   levothyroxine  25 mcg Oral Q0600   mometasone-formoterol  2 puff Inhalation BID   sulfamethoxazole-trimethoprim  1 tablet Oral Q12H   acetaminophen **OR** acetaminophen, albuterol, dextrose, guaiFENesin-dextromethorphan, LORazepam, morphine injection, ondansetron **OR** ondansetron (ZOFRAN) IV, oxyCODONE  Assessment/ Plan:  Ms. Madeline Martinez is a 78 y.o.  female with past medical conditions including type 2 diabetes, hypertension, anemia, blindness in right eye, seizures, and chronic kidney disease stage IIIa.  Patient presents to the emergency department with complaints of shortness of breath and hypoglycemia and has been admitted for Hypoglycemia [E16.2]   Acute Kidney Injury with hyperkalemia on chronic kidney disease stage IIIa with baseline creatinine 1.3 and GFR of 43 on 02/14/23.  Acute kidney injury secondary to poor oral intake resulting in dehydration combined with continued nephrotoxic medications.  Losartan and Jardiance. Renal ultrasound negative for hydronephrosis, simple renal cyst noted.  Potassium 5.8, patient has received Lokelma 10 g.  Agree with IV fluids.  Continue supportive care.  No acute indication for dialysis but will  monitor closely.  Lab Results  Component Value Date   CREATININE 3.54 (H) 04/10/2023   CREATININE 1.06 (H) 01/22/2022   CREATININE 1.14 (H) 01/21/2022    Intake/Output Summary (Last 24 hours) at 04/11/2023 1155 Last data filed at 04/10/2023 2249 Gross per 24 hour  Intake 1000 ml  Output --  Net 1000 ml    2. Acute metabolic acidosis, S bicarb 15. Sodium bicarbonate infusion in progress.   3. Anemia of chronic kidney disease Lab Results  Component Value Date   HGB 11.9 (L) 04/10/2023    Hgb within optimal range. Will monitor for now.   4. Hypertension with chronic kidney disease. Currently receiving carvedilol.    LOS: 1 Kuzey Ogata 2/13/202511:55 AM

## 2023-04-11 NOTE — Assessment & Plan Note (Addendum)
Type 2 diabetes Likely secondary to AKI with recent use of Bactrim, resulted in poor clearance of hypoglycemic agents Hold all hypoglycemic agents for now to resume when hypoglycemia is corrected Switching to LR with D5  D50 as needed Hypoglycemia protocol

## 2023-04-11 NOTE — ED Notes (Signed)
Dr Nelson Chimes, [facility] called and I spoke with Fellow Lana Abusalem. Per Vickey Huger pt has Bacterial and Acantameba corititis. Pt was suppose to have a I&D today missed it. They recommend that the Bactrim and the Miltefosine be stopped and for pt to comtinue with the fluconazole as well as her eye drops. The fellow also recommended transfer to [facility]. Here is her number [phone]

## 2023-04-11 NOTE — ED Notes (Signed)
Can you ask someone to fax her facesheet to [facility] transfer at [phone]

## 2023-04-11 NOTE — Assessment & Plan Note (Signed)
Will hold losartan and HCTZ due to AKI Continue carvedilol

## 2023-04-11 NOTE — Assessment & Plan Note (Signed)
Continue levothyroxine

## 2023-04-11 NOTE — ED Notes (Signed)
Received a call from [facility] in reference to attached patient want to give results for patient. Explained to her that care is with Hospitalist.Dr.Amin not ERMD. Sent secure chat while on the phone with [facility] Infectious disease wanting to discuss patient and get updates how can I connect you with this call.

## 2023-04-11 NOTE — ED Notes (Signed)
Pt provided with juice and graham crackers with peanut butter for glucose of 78.  Will recheck glucose after patient consumes snacks.

## 2023-04-11 NOTE — Assessment & Plan Note (Addendum)
Followed by ophthalmology and infectious diseases at [facility], last seen 2/6 Current medication as follows: Bactrim DS (Trimethoprim 160 mg-sulfamethoxazole 800mg ) 2 TABLETS BID.  Continue PO Fluconazole 400mg  once daily  Stop miltefosine 50mg  TID -as advised by ID at [facility] Ophthalmic eye drops per Ophthalmology team.

## 2023-04-12 ENCOUNTER — Encounter: Payer: Self-pay | Admitting: Internal Medicine

## 2023-04-12 DIAGNOSIS — E162 Hypoglycemia, unspecified: Secondary | ICD-10-CM | POA: Diagnosis not present

## 2023-04-12 LAB — CBC
HCT: 34.3 % — ABNORMAL LOW (ref 36.0–46.0)
Hemoglobin: 11.2 g/dL — ABNORMAL LOW (ref 12.0–15.0)
MCH: 31.1 pg (ref 26.0–34.0)
MCHC: 32.7 g/dL (ref 30.0–36.0)
MCV: 95.3 fL (ref 80.0–100.0)
Platelets: 262 10*3/uL (ref 150–400)
RBC: 3.6 MIL/uL — ABNORMAL LOW (ref 3.87–5.11)
RDW: 19.8 % — ABNORMAL HIGH (ref 11.5–15.5)
WBC: 7.4 10*3/uL (ref 4.0–10.5)
nRBC: 0 % (ref 0.0–0.2)

## 2023-04-12 LAB — BASIC METABOLIC PANEL
Anion gap: 8 (ref 5–15)
BUN: 57 mg/dL — ABNORMAL HIGH (ref 8–23)
CO2: 27 mmol/L (ref 22–32)
Calcium: 8.7 mg/dL — ABNORMAL LOW (ref 8.9–10.3)
Chloride: 105 mmol/L (ref 98–111)
Creatinine, Ser: 3.01 mg/dL — ABNORMAL HIGH (ref 0.44–1.00)
GFR, Estimated: 16 mL/min — ABNORMAL LOW (ref >60)
Glucose, Bld: 129 mg/dL — ABNORMAL HIGH (ref 70–99)
Potassium: 4.4 mmol/L (ref 3.5–5.1)
Sodium: 140 mmol/L (ref 135–145)

## 2023-04-12 LAB — CBG MONITORING, ED
Glucose-Capillary: 89 mg/dL (ref 70–99)
Glucose-Capillary: 120 mg/dL — ABNORMAL HIGH (ref 70–99)
Glucose-Capillary: 124 mg/dL — ABNORMAL HIGH (ref 70–99)
Glucose-Capillary: 212 mg/dL — ABNORMAL HIGH (ref 70–99)

## 2023-04-12 LAB — URIC ACID: Uric Acid, Serum: 8 mg/dL — ABNORMAL HIGH (ref 2.5–7.1)

## 2023-04-12 LAB — VITAMIN D 25 HYDROXY (VIT D DEFICIENCY, FRACTURES): Vit D, 25-Hydroxy: 40.69 ng/mL (ref 30–100)

## 2023-04-12 LAB — VITAMIN B12: Vitamin B-12: 169 pg/mL — ABNORMAL LOW (ref 180–914)

## 2023-04-12 LAB — IRON AND TIBC
Iron: 52 ug/dL (ref 28–170)
Saturation Ratios: 19 % (ref 10.4–31.8)
TIBC: 270 ug/dL (ref 250–450)
UIBC: 218 ug/dL

## 2023-04-12 LAB — GLUCOSE, CAPILLARY
Glucose-Capillary: 293 mg/dL — ABNORMAL HIGH (ref 70–99)
Glucose-Capillary: 381 mg/dL — ABNORMAL HIGH (ref 70–99)
Glucose-Capillary: 258 mg/dL — ABNORMAL HIGH (ref 70–99)

## 2023-04-12 MED ORDER — IPRATROPIUM-ALBUTEROL 0.5-2.5 (3) MG/3ML IN SOLN: 3.0000 mL | Freq: Four times a day (QID) | RESPIRATORY_TRACT | Status: DC | PRN

## 2023-04-12 MED ORDER — ALLOPURINOL 100 MG PO TABS
50.0000 mg | ORAL_TABLET | Freq: Every day | ORAL | Status: DC
  Administered 2023-04-13 – 2023-04-15 (×3): 50 mg via ORAL
  Filled 2023-04-12 (×2): qty 1
  Filled 2023-04-12: qty 0.5
  Filled 2023-04-12: qty 1

## 2023-04-12 MED ORDER — ENOXAPARIN SODIUM 30 MG/0.3ML IJ SOSY
30.0000 mg | PREFILLED_SYRINGE | INTRAMUSCULAR | Status: DC
  Administered 2023-04-12 – 2023-04-15 (×4): 30 mg via SUBCUTANEOUS
  Filled 2023-04-12 (×3): qty 0.3

## 2023-04-12 MED ORDER — SODIUM CHLORIDE 0.9 % IV SOLN: INTRAVENOUS | Status: DC

## 2023-04-12 NOTE — Evaluation (Signed)
Physical Therapy Evaluation Patient Details Name: Madeline Martinez MRN: [id number] DOB: 1946/02/25 Today's Date: 04/12/2023  History of Present Illness  Madeline Martinez is a 78 year old female with past medical conditions including type 2 diabetes, hypertension, anemia, blindness in right eye, seizures, and chronic kidney disease stage IIIa.  Patient presents to the emergency department with complaints of shortness of breath and hypoglycemia  Clinical Impression  Patient resting in bed upon arrival to room; alert and oriented to basic information, follows commands and agreeable to participation with session. Voices need for toileting and requests OOB to chair if possible.   Bilat UE/LE strength and ROM grossly symmetrical and WFL for basic transfers and gait; no focal weakness appreciated.  No pain reported.  Do note mild tremor, R > L, with exertion and fatigue; improves with rest periods. Currently requiring min/mod assist for bed mobility; min assist for sit/stand, basic transfers and gait (20' x2) with RW. Demonstrates hand-over-hand guidance for UE placement on RW; constant assist for RW guidance (due to baseline visual deficits). Very short, choppy steps with limited balance reactions; do recommend continued use of RW and +1 for optimal safety. Mild/mod SOB with exertion; sats >95% on RA  Would benefit from skilled PT to address above deficits and promote optimal return to PLOF.; recommend post-acute PT follow up as indicated by interdisciplinary care team.             If plan is discharge home, recommend the following: A little help with walking and/or transfers;A little help with bathing/dressing/bathroom   Can travel by private vehicle        Equipment Recommendations None recommended by PT  Recommendations for Other Services       Functional Status Assessment Patient has had a recent decline in their functional status and demonstrates the ability to make significant improvements  in function in a reasonable and predictable amount of time.     Precautions / Restrictions Precautions Precautions: Fall Precaution/Restrictions Comments: visually impaired-does intermittently see shapes, light to L eye Restrictions Weight Bearing Restrictions Per Provider Order: No      Mobility  Bed Mobility Overal bed mobility: Needs Assistance Bed Mobility: Supine to Sit     Supine to sit: Mod assist, Min assist          Transfers Overall transfer level: Needs assistance Equipment used: Rolling walker (2 wheels) Transfers: Sit to/from Stand Sit to Stand: Min assist           General transfer comment: hand-over-hand guidance for UE placement on RW; constant assist for RW guidance (due to baseline visual deficits)    Ambulation/Gait Ambulation/Gait assistance: Min assist Gait Distance (Feet):  (20' x2) Assistive device: Rolling walker (2 wheels)         General Gait Details: hand-over-hand guidance for UE placement on RW; constant assist for RW guidance (due to baseline visual deficits).  Very short, choppy steps with limited balance reactions; do recommend continued use of RW and +1 for optimal safety.  Mild/mod SOB with exertion; sats >95% on RA  Stairs            Wheelchair Mobility     Tilt Bed    Modified Rankin (Stroke Patients Only)       Balance Overall balance assessment: Needs assistance Sitting-balance support: Feet supported, No upper extremity supported Sitting balance-Leahy Scale: Good     Standing balance support: Bilateral upper extremity supported Standing balance-Leahy Scale: Fair  Pertinent Vitals/Pain Pain Assessment Pain Assessment: No/denies pain    Home Living Family/patient expects to be discharged to:: Private residence Living Arrangements: Children Available Help at Discharge: Family;Available PRN/intermittently Type of Home: House Home Access: Stairs to enter    Entrance Stairs-Number of Steps: 5   Home Layout: One level Home Equipment: Agricultural consultant (2 wheels)      Prior Function Prior Level of Function : Needs assist             Mobility Comments: Sup/mod indep with RW for limited household distances; denies recent fall history       Extremity/Trunk Assessment   Upper Extremity Assessment Upper Extremity Assessment: Overall WFL for tasks assessed (grossly at least 4/5 throughout)    Lower Extremity Assessment Lower Extremity Assessment: Overall WFL for tasks assessed (grossly at least 4/5 throughout; mild tremor, R > L, with exertion)       Communication   Communication Communication: No apparent difficulties    Cognition Arousal: Alert Behavior During Therapy: WFL for tasks assessed/performed   PT - Cognitive impairments: No apparent impairments                         Following commands: Intact       Cueing Cueing Techniques: Verbal cues, Tactile cues     General Comments      Exercises Other Exercises Other Exercises: Toilet transfer, ambulatory with RW, min assist; sit/stand from standard toilet, min assist   Assessment/Plan    PT Assessment Patient needs continued PT services  PT Problem List Decreased activity tolerance;Decreased balance;Decreased mobility;Decreased coordination;Decreased cognition;Decreased knowledge of use of DME;Decreased safety awareness;Decreased knowledge of precautions;Cardiopulmonary status limiting activity       PT Treatment Interventions DME instruction;Gait training;Stair training;Functional mobility training;Therapeutic activities;Therapeutic exercise;Balance training;Cognitive remediation;Patient/family education    PT Goals (Current goals can be found in the Care Plan section)  Acute Rehab PT Goals Patient Stated Goal: to get something to eat, to get out of the bed PT Goal Formulation: With patient Time For Goal Achievement: 04/26/23 Potential to Achieve  Goals: Good    Frequency Min 1X/week     Co-evaluation   Reason for Co-Treatment: To address functional/ADL transfers PT goals addressed during session: Mobility/safety with mobility OT goals addressed during session: ADL's and self-care       AM-PAC PT "6 Clicks" Mobility  Outcome Measure Help needed turning from your back to your side while in a flat bed without using bedrails?: None Help needed moving from lying on your back to sitting on the side of a flat bed without using bedrails?: A Lot Help needed moving to and from a bed to a chair (including a wheelchair)?: A Little Help needed standing up from a chair using your arms (e.g., wheelchair or bedside chair)?: A Little Help needed to walk in hospital room?: A Little Help needed climbing 3-5 steps with a railing? : A Little 6 Click Score: 18    End of Session Equipment Utilized During Treatment: Gait belt Activity Tolerance: Patient tolerated treatment well Patient left: in chair;with call bell/phone within reach;with chair alarm set Nurse Communication: Mobility status PT Visit Diagnosis: Muscle weakness (generalized) (M62.81);Difficulty in walking, not elsewhere classified (R26.2)    Time: 4403-4742 PT Time Calculation (min) (ACUTE ONLY): 33 min   Charges:   PT Evaluation $PT Eval Moderate Complexity: 1 Mod   PT General Charges $$ ACUTE PT VISIT: 1 Visit  Sheri Gatchel H. Manson Passey, PT, DPT, NCS 04/12/23, 3:58 PM [phone]

## 2023-04-12 NOTE — Evaluation (Signed)
Occupational Therapy Evaluation Patient Details Name: Madeline Martinez MRN: [id number] DOB: 11-02-1945 Today's Date: 04/12/2023   History of Present Illness   Madeline Martinez is a 78 year old female with past medical conditions including type 2 diabetes, hypertension, anemia, blindness in right eye, seizures, and chronic kidney disease stage IIIa.  Patient presents to the emergency department with complaints of shortness of breath and hypoglycemia     Clinical Impressions Madeline Martinez was seen for OT evaluation this date. Prior to hospital admission, pt was MOD I using RW limited household distances. Pt lives with family. Pt currently requires MIN A + RW for toilet t/f; assist for RW mgmt 2/2 baseline vision deficits. SUPERVISION pericare and seated hand washing. MAX A don B shoes. Pt would benefit from skilled OT to address noted impairments and functional limitations (see below for any additional details). Upon hospital discharge, recommend OT follow up and return to familiar environment.     If plan is discharge home, recommend the following:   A little help with walking and/or transfers;A little help with bathing/dressing/bathroom;Help with stairs or ramp for entrance     Functional Status Assessment   Patient has had a recent decline in their functional status and demonstrates the ability to make significant improvements in function in a reasonable and predictable amount of time.     Equipment Recommendations   BSC/3in1     Recommendations for Other Services         Precautions/Restrictions   Precautions Precautions: Fall Restrictions Weight Bearing Restrictions Per Provider Order: No     Mobility Bed Mobility Overal bed mobility: Needs Assistance Bed Mobility: Supine to Sit     Supine to sit: Min assist          Transfers Overall transfer level: Needs assistance Equipment used: Rolling walker (2 wheels) Transfers: Sit to/from Stand Sit to Stand: Min  assist                  Balance Overall balance assessment: Needs assistance Sitting-balance support: No upper extremity supported, Feet supported Sitting balance-Leahy Scale: Good     Standing balance support: Bilateral upper extremity supported Standing balance-Leahy Scale: Fair                             ADL either performed or assessed with clinical judgement   ADL Overall ADL's : Needs assistance/impaired                                       General ADL Comments: MIN A + RW for toilet t/f. SUPERVISION pericare and seated hand washing. MAX A don B shoes      Pertinent Vitals/Pain Pain Assessment Pain Assessment: No/denies pain     Extremity/Trunk Assessment Upper Extremity Assessment Upper Extremity Assessment: Overall WFL for tasks assessed (grossly at least 4/5 throughout)   Lower Extremity Assessment Lower Extremity Assessment: Overall WFL for tasks assessed (grossly at least 4/5 throughout; mild tremor, R > L, with exertion)       Communication Communication Communication: No apparent difficulties   Cognition Arousal: Alert Behavior During Therapy: WFL for tasks assessed/performed Cognition: No apparent impairments                               Following commands: Intact  Cueing  General Comments          Exercises     Shoulder Instructions      Home Living Family/patient expects to be discharged to:: Private residence Living Arrangements: Children Available Help at Discharge: Family;Available PRN/intermittently Type of Home: House Home Access: Stairs to enter [employer] of Steps: 5   Home Layout: One level               Home Equipment: Agricultural consultant (2 wheels)          Prior Functioning/Environment Prior Level of Function : Needs assist             Mobility Comments: Sup/mod indep with RW for limited household distances; denies recent fall history       OT Problem List: Decreased activity tolerance;Decreased strength;Impaired balance (sitting and/or standing);Decreased safety awareness   OT Treatment/Interventions: Therapeutic exercise;Self-care/ADL training;Energy conservation;DME and/or AE instruction;Therapeutic activities      OT Goals(Current goals can be found in the care plan section)   Acute Rehab OT Goals Patient Stated Goal: to go home OT Goal Formulation: With patient Time For Goal Achievement: 04/26/23 Potential to Achieve Goals: Good ADL Goals Pt Will Perform Grooming: with modified independence;standing Pt Will Perform Lower Body Dressing: with modified independence;sit to/from stand Pt Will Transfer to Toilet: with modified independence;ambulating;regular height toilet   OT Frequency:  Min 1X/week    Co-evaluation PT/OT/SLP Co-Evaluation/Treatment: Yes Reason for Co-Treatment: To address functional/ADL transfers PT goals addressed during session: Mobility/safety with mobility OT goals addressed during session: ADL's and self-care      AM-PAC OT "6 Clicks" Daily Activity     Outcome Measure Help from another person eating meals?: None Help from another person taking care of personal grooming?: A Little Help from another person toileting, which includes using toliet, bedpan, or urinal?: A Little Help from another person bathing (including washing, rinsing, drying)?: A Lot Help from another person to put on and taking off regular upper body clothing?: A Little Help from another person to put on and taking off regular lower body clothing?: A Lot 6 Click Score: 17   End of Session Equipment Utilized During Treatment: Rolling walker (2 wheels);Gait belt Nurse Communication: Mobility status  Activity Tolerance: Patient tolerated treatment well Patient left: in chair;with call bell/phone within reach;with chair alarm set  OT Visit Diagnosis: Other abnormalities of gait and mobility (R26.89);Muscle weakness  (generalized) (M62.81)                Time: 1610-9604 OT Time Calculation (min): 22 min Charges:  OT General Charges $OT Visit: 1 Visit OT Evaluation $OT Eval Moderate Complexity: 1 Mod  Kathie Dike, M.S. OTR/L  04/12/23, 3:50 PM  ascom [phone]

## 2023-04-12 NOTE — ED Notes (Signed)
Patient refusing eye drops while sleeping stating that their eye doctor has told her to take them while awake.

## 2023-04-12 NOTE — Progress Notes (Signed)
Progress Note   Patient: Madeline Martinez MVH:[id number] DOB: 11-10-1945 DOA: 04/10/2023     2 DOS: the patient was seen and examined on 04/12/2023   Brief hospital course: Taken from H&P.  Madeline Martinez is a 78 y.o. 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  Rest of the ROS was negative.  She was found to have blood sugar of 33 at home.  On presentation stable vital   Respiratory viral panel negative.  Creatinine 3.54,up from 1.6 on 2/6,Potassium 5.8, bicarb 15 and anion gap 16. Serum blood glucose 56.  EKG with sinus tachycardia at 108, no acute ST-T wave changes Chest x-ray with mild chronic pulmonary vascular congestion and no acute airspace disease.  Patient received LR bolus, D50 and was subsequently started on bicarb in D5  2/13: Continue to have intermittent hypoglycemia.  A1c of 8.5, likely have AKI with Bactrim which was discontinued, result in accumulation of glipizide, metformin and Jardiance.  Madeline Martinez was also listed in her medications but apparently not taking them recently.  [facility] contacted [place] as she was supposed to get a procedure for this amoebic keratitis of left eye today, initially they requested transfer, transfer was declined.  Discussed with her ophthalmologist at [facility] and they will see her early next week as outpatient.  According to them there is no urgent need for any abscess drainage.  Per ID in [facility] she will continue with fluconazole, stop Bactrim she will also continue with eyedrops. Slowly improving renal function.  Renal ultrasound without any acute abnormality. Metabolic acidosis has been resolved.  Switching to LR with D5    Assessment and  Plan: Hypoglycemia, resolved Type 2 diabetes Likely secondary to AKI with recent use of Bactrim, resulted in poor clearance of hypoglycemic agents Hold all hypoglycemic agents for now to resume when hypoglycemia is corrected Continue D50 as needed,Hypoglycemia protocol S/p LR with D5, d/c'd on 2/14, started NS 75 mL/h  Acute renal failure superimposed on stage 3a chronic kidney disease High anion gap metabolic acidosis Possibly related to recent Bactrim treatment started on 2/6 Renal ultrasound without any acute abnormality Metabolic acidosis resolved Renal function gradually improving Monitor renal function and avoid nephrotoxins Bactrim was discontinued after discussing with her ID at [facility] S/p bicarb infusion and LR and D5 2/14 started NS 75 ml/hr Creatinine 3.01  Eye infection, left Followed by ophthalmology and infectious diseases at [facility], last seen 2/6 Current medication as follows: Bactrim DS (Trimethoprim 160 mg-sulfamethoxazole 800mg ) 2 TABLETS BID.  Continue PO Fluconazole 400mg  once daily  Stop miltefosine 50mg  TID -as advised by ID at [facility] Ophthalmic eye drops per Ophthalmology team.      Generalized weakness Patient symptomatic for weakness-no localizing symptoms except for known eye infection respiratory viral panel negative.  Will get urinalysis Encourage oral hydration   Asthma, chronic Continue home inhalers DuoNebs as needed  Anxiety Continue home fluoxetine, mirtazapine and lorazepam  Essential hypertension Will hold losartan and HCTZ due to AKI Continue carvedilol  Diabetes mellitus, type II (HCC) Holding metformin, Cozaar, Tresiba, glipizide, Jardiance, all due to hypoglycemia Resume as appropriate  Hypothyroidism Continue levothyroxine  OSA (obstructive sleep apnea) CPAP if desired  Chronic anemia Chronic macrocytic anemia, MCV 100.5  Depression Continue fluoxetine   Subjective: No  significant events overnight, patient denies any  speech complaint, feels generalized weakness and tiredness, no any other complaints. Management plan discussed with patient's daughter at bedside.   Physical Exam: Vitals:   04/12/23 1100 04/12/23 1130 04/12/23 1200 04/12/23 1335  BP: 127/70 112/73 (!) 115/99 128/77  Pulse: (!) 102 94 94 91  Resp: 19 15 16 16   Temp:    98.4 F (36.9 C)  TempSrc:      SpO2: 100% 100% 100% 96%  Weight:      Height:       General.  Obese elderly lady, not is acute distress.  Right eye blind, left eye with significant conjunctival congestion. Pulmonary.  Lungs clear bilaterally, normal respiratory effort. CV.  Regular rate and rhythm, no JVD, rub or murmur. Abdomen.  Soft, nontender, nondistended, BS positive. CNS.  Alert and oriented .  No focal neurologic deficit. Extremities.  No edema, no cyanosis, pulses intact and symmetrical.  Data Reviewed: Prior data reviewed  Family Communication: Discussed with daughter at bedside  Disposition: Status is: Inpatient Remains inpatient appropriate because: Severity of illness  Planned Discharge Destination: Home  DVT prophylaxis.  Lovenox Time spent:  35 minutes  This record has been created using Conservation officer, historic buildings. Errors have been sought and corrected,but may not always be located. Such creation errors do not reflect on the standard of care.   Author: Gillis Santa, MD 04/12/2023 3:25 PM  For on call review [email or web address].

## 2023-04-12 NOTE — ED Notes (Signed)
Pt needing to use the bathroom at this time. Upon entering rm pt had voided in the bed. Pt placed on bedpan. Pt stating she still needed to go. Pt passed gas. Pt linens changed, clean chux pads placed, and brief applied. Pt short changed and gown placed. Pt repositioned in bed.

## 2023-04-12 NOTE — ED Notes (Signed)
Pt resting. Per pt daughter she does not wish for her mother to be awoken. Meds not passed at this time.

## 2023-04-12 NOTE — Progress Notes (Signed)
[facility]  ROUNDING NOTE   Subjective:   Madeline Martinez is a 78 year old female with past medical conditions including type 2 diabetes, hypertension, anemia, blindness in right eye, seizures, and chronic kidney disease stage IIIa.  Patient presents to the emergency department with complaints of shortness of breath and hypoglycemia and has been admitted for Hypoglycemia [E16.2]  Patient is known to our practice and is followed by Dr.Korrapati. Was last seen in office on 07/03/22, caregiver at bedside states they missed appt in November.   Patient resting in bed Alert  Appetite remains poor  Room air   Objective:  Vital signs in last 24 hours:  Temp:  [97.8 F (36.6 C)-98.5 F (36.9 C)] 97.8 F (36.6 C) (02/14 0811) Pulse Rate:  [67-102] 94 (02/14 1200) Resp:  [11-21] 16 (02/14 1200) BP: (90-160)/(62-113) 115/99 (02/14 1200) SpO2:  [98 %-100 %] 100 % (02/14 1200)  Weight change:  Filed Weights   04/10/23 1950  Weight: 82.1 kg    Intake/Output: I/O last 3 completed shifts: In: 1000 [IV Piggyback:1000] Out: -    Intake/Output this shift:  No intake/output data recorded.  Physical Exam: General: NAD  Head: Normocephalic, Dry oral mucosal membranes  Eyes: Nonanicteric  Lungs:  Clear to auscultation  Heart: Regular rate and rhythm  Abdomen:  Soft, nontender  Extremities: No peripheral edema.  Neurologic: Alert  Skin: No lesions       Basic Metabolic Panel: Recent Labs  Lab 04/10/23 2009 04/11/23 1257 04/12/23 0626  NA 137 136 140  K 5.8* 4.3 4.4  CL 106 103 105  CO2 15* 23 27  GLUCOSE 56* 71 129*  BUN 69* 64* 57*  CREATININE 3.54* 3.24* 3.01*  CALCIUM 8.9 8.5* 8.7*    Liver Function Tests: No results for input(s): "AST", "ALT", "ALKPHOS", "BILITOT", "PROT", "ALBUMIN" in the last 168 hours. No results for input(s): "LIPASE", "AMYLASE" in the last 168 hours. No results for input(s): "AMMONIA" in the last 168 hours.  CBC: Recent Labs   Lab 04/10/23 2009 04/12/23 0626  WBC 6.6 7.4  NEUTROABS 5.2  --   HGB 11.9* 11.2*  HCT 37.9 34.3*  MCV 100.5* 95.3  PLT 278 262    Cardiac Enzymes: No results for input(s): "CKTOTAL", "CKMB", "CKMBINDEX", "TROPONINI" in the last 168 hours.  BNP: Invalid input(s): "POCBNP"  CBG: Recent Labs  Lab 04/11/23 2018 04/12/23 0139 04/12/23 0544 04/12/23 0756 04/12/23 1140  GLUCAP 105* 89 120* 124* 212*    Microbiology: Results for orders placed or performed during the hospital encounter of 04/10/23  Resp panel by RT-PCR (RSV, Flu A&B, Covid) Anterior Nasal Swab     Status: None   Collection Time: 04/10/23  7:56 PM   Specimen: Anterior Nasal Swab  Result Value Ref Range Status   SARS Coronavirus 2 by RT PCR NEGATIVE NEGATIVE Final    Comment: (NOTE) SARS-CoV-2 target nucleic acids are NOT DETECTED.  The SARS-CoV-2 RNA is generally detectable in upper respiratory specimens during the acute phase of infection. The lowest concentration of SARS-CoV-2 viral copies this assay can detect is 138 copies/mL. A negative result does not preclude SARS-Cov-2 infection and should not be used as the sole basis for treatment or other patient management decisions. A negative result may occur with  improper specimen collection/handling, submission of specimen other than nasopharyngeal swab, presence of viral mutation(s) within the areas targeted by this assay, and inadequate number of viral copies(<138 copies/mL). A negative result must be combined with clinical observations, patient  history, and epidemiological information. The expected result is Negative.  Fact Sheet for Patients:  BloggerCourse.com  Fact Sheet for Healthcare Providers:  SeriousBroker.it  This test is no t yet approved or cleared by the [place] FDA and  has been authorized for detection and/or diagnosis of SARS-CoV-2 by FDA under an Emergency Use Authorization  (EUA). This EUA will remain  in effect (meaning this test can be used) for the duration of the COVID-19 declaration under Section 564(b)(1) of the Act, 21 U.S.C.section 360bbb-3(b)(1), unless the authorization is terminated  or revoked sooner.       Influenza A by PCR NEGATIVE NEGATIVE Final   Influenza B by PCR NEGATIVE NEGATIVE Final    Comment: (NOTE) The Xpert Xpress SARS-CoV-2/FLU/RSV plus assay is intended as an aid in the diagnosis of influenza from Nasopharyngeal swab specimens and should not be used as a sole basis for treatment. Nasal washings and aspirates are unacceptable for Xpert Xpress SARS-CoV-2/FLU/RSV testing.  Fact Sheet for Patients: BloggerCourse.com  Fact Sheet for Healthcare Providers: SeriousBroker.it  This test is not yet approved or cleared by the [place] FDA and has been authorized for detection and/or diagnosis of SARS-CoV-2 by FDA under an Emergency Use Authorization (EUA). This EUA will remain in effect (meaning this test can be used) for the duration of the COVID-19 declaration under Section 564(b)(1) of the Act, 21 U.S.C. section 360bbb-3(b)(1), unless the authorization is terminated or revoked.     Resp Syncytial Virus by PCR NEGATIVE NEGATIVE Final    Comment: (NOTE) Fact Sheet for Patients: BloggerCourse.com  Fact Sheet for Healthcare Providers: SeriousBroker.it  This test is not yet approved or cleared by the [place] FDA and has been authorized for detection and/or diagnosis of SARS-CoV-2 by FDA under an Emergency Use Authorization (EUA). This EUA will remain in effect (meaning this test can be used) for the duration of the COVID-19 declaration under Section 564(b)(1) of the Act, 21 U.S.C. section 360bbb-3(b)(1), unless the authorization is terminated or revoked.  Performed at [facility], [address]  [place], [place] [address]     Coagulation Studies: No results for input(s): "LABPROT", "INR" in the last 72 hours.  Urinalysis: No results for input(s): "COLORURINE", "LABSPEC", "PHURINE", "GLUCOSEU", "HGBUR", "BILIRUBINUR", "KETONESUR", "PROTEINUR", "UROBILINOGEN", "NITRITE", "LEUKOCYTESUR" in the last 72 hours.  Invalid input(s): "APPERANCEUR"    Imaging: US RENAL Result Date: 04/11/2023 CLINICAL DATA:  Acute kidney injury EXAM: RENAL / URINARY TRACT ULTRASOUND COMPLETE COMPARISON:  CT abdomen and pelvis dated 05/02/2021 FINDINGS: Right Kidney: Length = 10.2 cm Normal parenchymal echogenicity with preserved corticomedullary differentiation. Upper pole simple cyst measures 5.9 cm. Additional subcentimeter hypodensities on prior CT abdomen are not well seen on this examination. No urinary tract dilation or shadowing calculi. The ureter is not seen. Left Kidney: Length = 10.2 cm Normal parenchymal echogenicity with preserved corticomedullary differentiation. Interpolar simple cyst measures 2.2 cm. Additional subcentimeter hypodensities on prior CT abdomen are not well seen on this examination. No urinary tract dilation or shadowing calculi. The ureter is not seen. Bladder: Appears normal for degree of bladder distention. Other: None. IMPRESSION: 1. No urinary tract dilation or shadowing calculi. 2. Bilateral simple renal cysts. Electronically Signed   By: Agustin Cree M.D.   On: 04/11/2023 09:42   DG Chest 1 View Result Date: 04/10/2023 CLINICAL DATA:  Short of breath, fatigue, hypoglycemia EXAM: CHEST  1 VIEW COMPARISON:  01/21/2022 FINDINGS: 2 frontal views of the chest demonstrate a stable cardiac silhouette. No airspace  disease, effusion, or pneumothorax. Mild chronic central vascular congestion. No acute bony abnormalities. IMPRESSION: 1. Mild chronic pulmonary vascular congestion. No acute airspace disease. Electronically Signed   By: Sharlet Salina M.D.   On: 04/10/2023 20:43     Medications:     dextrose 5% lactated ringers 75 mL/hr at 04/12/23 0540    allopurinol  50 mg Oral Q lunch   ascorbic acid  1,000 mg Oral TID   aspirin EC  81 mg Oral Daily   atorvastatin  80 mg Oral Daily   atropine  1 drop Right Eye BID   carvedilol  12.5 mg Oral BID   Ceftazidime  1 drop Left Eye Q2H   cholecalciferol  1,000 Units Oral Daily   cycloSPORINE  1 drop Left Eye Q2H   enoxaparin (LOVENOX) injection  30 mg Subcutaneous Q24H   ferrous sulfate  325 mg Oral Daily   fluconazole  200 mg Oral Daily   FLUoxetine  40 mg Oral Daily   insulin aspart  0-5 Units Subcutaneous QHS   insulin aspart  0-6 Units Subcutaneous TID WC   levothyroxine  25 mcg Oral Q0600   mirtazapine  15 mg Oral QHS   mometasone-formoterol  2 puff Inhalation BID   pantoprazole  40 mg Oral Daily   Polyhexamethylene Biguanide  1 drop Left Eye Q2H   prednisoLONE acetate  1 drop Right Eye BID   predniSONE  20 mg Oral Q breakfast   pregabalin  75 mg Oral BID   Vancomycin HCl  1 drop Left Eye Q2H   acetaminophen **OR** acetaminophen, dextrose, guaiFENesin-dextromethorphan, ipratropium-albuterol, LORazepam, morphine injection, ondansetron **OR** ondansetron (ZOFRAN) IV, oxyCODONE  Assessment/ Plan:  Ms. Madeline Martinez is a 78 y.o.  female with past medical conditions including type 2 diabetes, hypertension, anemia, blindness in right eye, seizures, and chronic kidney disease stage IIIa.  Patient presents to the emergency department with complaints of shortness of breath and hypoglycemia and has been admitted for Hypoglycemia [E16.2]   Acute Kidney Injury with hyperkalemia on chronic kidney disease stage IIIa with baseline creatinine 1.3 and GFR of 43 on 02/14/23.  Acute kidney injury secondary to poor oral intake resulting in dehydration combined with continued nephrotoxic medications.  Losartan and Jardiance. Renal ultrasound negative for hydronephrosis, simple renal cyst noted.    Potassium 4.4, corrected with Lokelma.  Creatinine mildly improved. Recommend continuing IVF. No acute indication for dialysis.   Lab Results  Component Value Date   CREATININE 3.01 (H) 04/12/2023   CREATININE 3.24 (H) 04/11/2023   CREATININE 3.54 (H) 04/10/2023   No intake or output data in the 24 hours ending 04/12/23 1242   2. Acute metabolic acidosis, S bicarb corrected to 27. IVF changed to D5.   3. Anemia of chronic kidney disease Lab Results  Component Value Date   HGB 11.2 (L) 04/12/2023    Hgb 11.2, stable. Will monitor for now.   4. Hypertension with chronic kidney disease. Currently receiving carvedilol. Blood pressure stable   LOS: 2 Fernanda Twaddell 2/14/202512:42 PM

## 2023-04-13 DIAGNOSIS — E162 Hypoglycemia, unspecified: Secondary | ICD-10-CM | POA: Diagnosis not present

## 2023-04-13 LAB — CBC
HCT: 32.7 % — ABNORMAL LOW (ref 36.0–46.0)
Hemoglobin: 11 g/dL — ABNORMAL LOW (ref 12.0–15.0)
MCH: 31.2 pg (ref 26.0–34.0)
MCHC: 33.6 g/dL (ref 30.0–36.0)
MCV: 92.6 fL (ref 80.0–100.0)
Platelets: 267 10*3/uL (ref 150–400)
RBC: 3.53 MIL/uL — ABNORMAL LOW (ref 3.87–5.11)
RDW: 19.7 % — ABNORMAL HIGH (ref 11.5–15.5)
WBC: 6.2 10*3/uL (ref 4.0–10.5)
nRBC: 0 % (ref 0.0–0.2)

## 2023-04-13 LAB — BASIC METABOLIC PANEL
Anion gap: 10 (ref 5–15)
BUN: 50 mg/dL — ABNORMAL HIGH (ref 8–23)
CO2: 23 mmol/L (ref 22–32)
Calcium: 8.9 mg/dL (ref 8.9–10.3)
Chloride: 106 mmol/L (ref 98–111)
Creatinine, Ser: 2.42 mg/dL — ABNORMAL HIGH (ref 0.44–1.00)
GFR, Estimated: 20 mL/min — ABNORMAL LOW (ref >60)
Glucose, Bld: 164 mg/dL — ABNORMAL HIGH (ref 70–99)
Potassium: 4.6 mmol/L (ref 3.5–5.1)
Sodium: 139 mmol/L (ref 135–145)

## 2023-04-13 LAB — GLUCOSE, CAPILLARY
Glucose-Capillary: 162 mg/dL — ABNORMAL HIGH (ref 70–99)
Glucose-Capillary: 184 mg/dL — ABNORMAL HIGH (ref 70–99)
Glucose-Capillary: 298 mg/dL — ABNORMAL HIGH (ref 70–99)
Glucose-Capillary: 260 mg/dL — ABNORMAL HIGH (ref 70–99)

## 2023-04-13 LAB — MAGNESIUM: Magnesium: 2.3 mg/dL (ref 1.7–2.4)

## 2023-04-13 LAB — PHOSPHORUS: Phosphorus: 3.5 mg/dL (ref 2.5–4.6)

## 2023-04-13 MED ORDER — SODIUM CHLORIDE 0.9 % IV SOLN: INTRAVENOUS | Status: DC

## 2023-04-13 MED ORDER — VITAMIN B-12 1000 MCG PO TABS: 1000.0000 ug | ORAL_TABLET | Freq: Every day | ORAL | Status: DC

## 2023-04-13 MED ORDER — CYANOCOBALAMIN 1000 MCG/ML IJ SOLN
1000.0000 ug | Freq: Every day | INTRAMUSCULAR | Status: DC
  Administered 2023-04-13 – 2023-04-15 (×3): 1000 ug via INTRAMUSCULAR
  Filled 2023-04-13 (×3): qty 1

## 2023-04-13 NOTE — Progress Notes (Signed)
 Physical Therapy Treatment Patient Details Name: Madeline Martinez MRN: [id number] DOB: 1946/01/15 Today's Date: 04/13/2023   History of Present Illness Madeline Martinez is a 78 year old female with past medical conditions including type 2 diabetes, hypertension, anemia, blindness in right eye, seizures, and chronic kidney disease stage IIIa.  Patient presents to the emergency department with complaints of shortness of breath and hypoglycemia    PT Comments  Pt ready for session.  She is able to get to EOB with min a x 1.  Steady in sitting.  Stands to walker with light min assist and walks to doorway with short shuffling steps and heavy reliance on walker.  Fatigues quickly and rest taken in chair by doorway.  She is able to walk to bathroom to void/BM and needs assist for care.  Walks back to bed and opts to return to supine stating she was OOB earlier today.  Back to bed with min a x 1.    Pt does seem to have all equipment at home and per friend in room daughter is available to help at home +24 hours.     If plan is discharge home, recommend the following: A little help with walking and/or transfers;A little help with bathing/dressing/bathroom;Assist for transportation;Help with stairs or ramp for entrance;Assistance with cooking/housework   Can travel by private vehicle        Equipment Recommendations  None recommended by PT    Recommendations for Other Services       Precautions / Restrictions Precautions Precautions: Fall Precaution/Restrictions Comments: visually impaired-does intermittently see shapes, light to L eye Restrictions Weight Bearing Restrictions Per Provider Order: No     Mobility  Bed Mobility Overal bed mobility: Needs Assistance Bed Mobility: Supine to Sit, Sit to Supine     Supine to sit: Min assist Sit to supine: Min assist   General bed mobility comments: light assist needed Patient Response: Cooperative  Transfers Overall transfer level: Needs  assistance Equipment used: Rolling walker (2 wheels) Transfers: Sit to/from Stand Sit to Stand: Min assist                Ambulation/Gait Ambulation/Gait assistance: Min Chemical engineer (Feet): 15 Feet Assistive device: Rolling walker (2 wheels) Gait Pattern/deviations: Step-through pattern, Decreased step length - right, Decreased step length - left, Wide base of support, Trunk flexed Gait velocity: decreased     General Gait Details: 15' x 1 and 10' x 1 in room.  short choppy steps with generally decreased balance.   Stairs             Wheelchair Mobility     Tilt Bed Tilt Bed Patient Response: Cooperative  Modified Rankin (Stroke Patients Only)       Balance Overall balance assessment: Needs assistance Sitting-balance support: Feet supported, No upper extremity supported Sitting balance-Leahy Scale: Good     Standing balance support: Bilateral upper extremity supported Standing balance-Leahy Scale: Fair                              Communication    Cognition Arousal: Alert Behavior During Therapy: WFL for tasks assessed/performed   PT - Cognitive impairments: No apparent impairments                                Cueing    Exercises Other Exercises Other Exercises: to bathroom to void and BM  General Comments        Pertinent Vitals/Pain Pain Assessment Pain Assessment: Faces Faces Pain Scale: Hurts a little bit Pain Location: knees Pain Descriptors / Indicators: Sore, Aching Pain Intervention(s): Limited activity within patient's tolerance, Monitored during session, Repositioned    Home Living                          Prior Function            PT Goals (current goals can now be found in the care plan section) Progress towards PT goals: Progressing toward goals    Frequency    Min 1X/week      PT Plan      Co-evaluation              AM-PAC PT "6 Clicks" Mobility    Outcome Measure  Help needed turning from your back to your side while in a flat bed without using bedrails?: None Help needed moving from lying on your back to sitting on the side of a flat bed without using bedrails?: A Little Help needed moving to and from a bed to a chair (including a wheelchair)?: A Little Help needed standing up from a chair using your arms (e.g., wheelchair or bedside chair)?: A Little Help needed to walk in hospital room?: A Little Help needed climbing 3-5 steps with a railing? : A Lot 6 Click Score: 18    End of Session Equipment Utilized During Treatment: Gait belt Activity Tolerance: Patient tolerated treatment well Patient left: in bed;with call bell/phone within reach;with bed alarm set;with family/visitor present Nurse Communication: Mobility status PT Visit Diagnosis: Muscle weakness (generalized) (M62.81);Difficulty in walking, not elsewhere classified (R26.2)     Time: 9562-1308 PT Time Calculation (min) (ACUTE ONLY): 26 min  Charges:    $Gait Training: 8-22 mins $Therapeutic Activity: 8-22 mins PT General Charges $$ ACUTE PT VISIT: 1 Visit                   Danielle Dess, PTA 04/13/23, 3:55 PM

## 2023-04-13 NOTE — Progress Notes (Signed)
 Progress Note   Patient: Madeline Martinez ZOX:[id number] DOB: 1945-09-22 DOA: 04/10/2023     3 DOS: the patient was seen and examined on 04/13/2023   Brief hospital course: Taken from H&P.  Tylynn Braniff is a 78 y.o. 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  Rest of the ROS was negative.  She was found to have blood sugar of 33 at home.  On presentation stable vital   Respiratory viral panel negative.  Creatinine 3.54,up from 1.6 on 2/6,Potassium 5.8, bicarb 15 and anion gap 16. Serum blood glucose 56.  EKG with sinus tachycardia at 108, no acute ST-T wave changes Chest x-ray with mild chronic pulmonary vascular congestion and no acute airspace disease.  Patient received LR bolus, D50 and was subsequently started on bicarb in D5  2/13: Continue to have intermittent hypoglycemia.  A1c of 8.5, likely have AKI with Bactrim which was discontinued, result in accumulation of glipizide, metformin and Jardiance.  [name] was also listed in her medications but apparently not taking them recently.  [facility] contacted [place] as she was supposed to get a procedure for this amoebic keratitis of left eye today, initially they requested transfer, transfer was declined.  Discussed with her ophthalmologist at [facility] and they will see her early next week as outpatient.  According to them there is no urgent need for any abscess drainage.  Per ID in [facility] she will continue with fluconazole, stop Bactrim she will also continue with eyedrops. Slowly improving renal function.  Renal ultrasound without any acute abnormality. Metabolic acidosis has been resolved.  Switching to LR with D5    Assessment and  Plan: Hypoglycemia, resolved Type 2 diabetes Likely secondary to AKI with recent use of Bactrim, resulted in poor clearance of hypoglycemic agents Hold all hypoglycemic agents for now to resume when hypoglycemia is corrected Continue D50 as needed,Hypoglycemia protocol S/p LR with D5, d/c'd on 2/14, started NS 75 mL/h  Acute renal failure superimposed on stage 3a chronic kidney disease High anion gap metabolic acidosis Possibly related to recent Bactrim treatment started on 2/6 Renal ultrasound without any acute abnormality Metabolic acidosis resolved Renal function gradually improving Monitor renal function and avoid nephrotoxins Bactrim was discontinued after discussing with her ID at [facility] S/p bicarb infusion and LR and D5 2/14 started NS 75 ml/hr Creatinine 3.01--2.42 gradually improving  Eye infection, left Followed by ophthalmology and infectious diseases at [facility], last seen 2/6 Current medication as follows: Bactrim DS (Trimethoprim 160 mg-sulfamethoxazole 800mg ) 2 TABLETS BID.  Continue PO Fluconazole 400mg  once daily  Stop miltefosine 50mg  TID -as advised by ID at [facility] Ophthalmic eye drops per Ophthalmology team.      Generalized weakness Patient symptomatic for weakness-no localizing symptoms except for known eye infection respiratory viral panel negative.  Will get urinalysis Encourage oral hydration   Asthma, chronic Continue home inhalers DuoNebs as needed  Anxiety Continue home fluoxetine, mirtazapine and lorazepam  Essential hypertension Will hold losartan and HCTZ due to AKI Continue carvedilol  Diabetes mellitus, type II (HCC) Holding metformin, Cozaar, Tresiba, glipizide, Jardiance, all due to hypoglycemia Resume as appropriate  Hypothyroidism Continue levothyroxine  OSA (obstructive sleep apnea) CPAP if desired   Depression: Continue fluoxetine  # Chronic microcytic anemia due to B12 deficiency #  Vitamin B12 deficiency: Started vitamin  B12 1000 mcg IM injection daily during hospital stay, followed by oral supplement.  Follow-up PCP to repeat vitamin B12 level after 3 to 6 months.   Subjective: No significant events overnight, overall patient is feeling better, complaining of knee pain more on the left than the right, she has chronic osteoarthritis.  Denies any other complaints.   Physical Exam: Vitals:   04/12/23 1555 04/12/23 1959 04/13/23 0400 04/13/23 0808  BP: 139/71 (!) 148/75 (!) 144/72 (!) 147/80  Pulse: 100 95 85 92  Resp: 20 18 18 20   Temp: 98.6 F (37 C) 99.2 F (37.3 C) (!) 97.5 F (36.4 C) (!) 97.5 F (36.4 C)  TempSrc: Oral  Oral   SpO2: 96% 100% 99% 100%  Weight:      Height:       General.  Obese elderly lady, not is acute distress.  Right eye blind, left eye with significant conjunctival congestion. Pulmonary.  Lungs clear bilaterally, normal respiratory effort. CV.  Regular rate and rhythm, no JVD, rub or murmur. Abdomen.  Soft, nontender, nondistended, BS positive. CNS.  Alert and oriented .  No focal neurologic deficit. Extremities.  No edema, no cyanosis, pulses intact and symmetrical.  Data Reviewed: Prior data reviewed  Family Communication: Discussed with patient's daughter at bedside  Disposition: Status is: Inpatient Remains inpatient appropriate because: Severity of illness  Planned Discharge Destination: Home  DVT prophylaxis.  Lovenox Time spent:  35 minutes  This record has been created using Conservation officer, historic buildings. Errors have been sought and corrected,but may not always be located. Such creation errors do not reflect on the standard of care.   Author: Gillis Santa, MD 04/13/2023 11:57 AM  For on call review [email or web address].

## 2023-04-13 NOTE — Plan of Care (Signed)

## 2023-04-13 NOTE — Progress Notes (Signed)
 Patient provided touch pad call light to use to call for assistance. Pt demonstrates proper use of call light. Encouraged to call for assistance.

## 2023-04-13 NOTE — TOC Initial Note (Signed)
 Transition of Care [facility]) - Initial/Assessment Note    Patient Details  Name: Madeline Martinez MRN: [id number] Date of Birth: [birth year]  Transition of [facility]) CM/SW Contact:    Rodney Langton, RN Phone Number: 04/13/2023, 9:31 AM  Clinical Narrative:                  Patient admitted from home with daughter.  Spoke with daughter, patient sees Dr. Doretha Sou, [facility] [place].  She has walker, cane and wheelchair at home. Advised of recommendations for HHPT/OT, daughter agrees and does not have preference of agency.  She is also asking for aide services for ADLs a few times a week.  Advised that this can be ordered through the [facility] team, but she will need to look at other options for long term. She does not have Medicaid, advised that this will be an out of pocket expense.  Will email list of private pay aide agencies. HH referral sent to [facility], accepted by [facility] for PT/OT/aide.   Expected Discharge Plan: Home w Home Health Services Barriers to Discharge: Continued Medical Work up   Patient Goals and CMS Choice Patient states their goals for this hospitalization and ongoing recovery are:: Home with home health   Choice offered to / list presented to : Adult Children      Expected Discharge Plan and Services     Post Acute Care Choice: Home Health Living arrangements for the past 2 months: Single Family Home                           HH Arranged: Nurse's Aide, PT, OT HH Agency: [facility] Date [facility] Agency Contacted: 04/13/23 Time HH Agency Contacted: 0930 Representative spoke with at [facility] Agency: Elnita Maxwell  Prior Living Arrangements/Services Living arrangements for the past 2 months: Single Family Home Lives with:: Adult Children          Need for Family Participation in Patient Care: Yes (Comment) Care giver support system in place?: Yes (comment) Current home services: DME Criminal Activity/Legal Involvement Pertinent to Current  Situation/Hospitalization: No - Comment as needed  Activities of Daily Living   ADL Screening (condition at time of admission) Independently performs ADLs?: No Does the patient have a NEW difficulty with bathing/dressing/toileting/self-feeding that is expected to last >3 days?: Yes (Initiates electronic notice to provider for possible OT consult) Does the patient have a NEW difficulty with getting in/out of bed, walking, or climbing stairs that is expected to last >3 days?: Yes (Initiates electronic notice to provider for possible PT consult) Does the patient have a NEW difficulty with communication that is expected to last >3 days?: No Is the patient deaf or have difficulty hearing?: No Does the patient have difficulty seeing, even when wearing glasses/contacts?: Yes Does the patient have difficulty concentrating, remembering, or making decisions?: Yes  Permission Sought/Granted   Permission granted to share information with : Yes, Verbal Permission Granted              Emotional Assessment       Orientation: : Oriented to Self, Oriented to Place, Oriented to  Time, Oriented to Situation   Psych Involvement: No (comment)  Admission diagnosis:  Dehydration [E86.0] Hypoglycemia [E16.2] AKI (acute kidney injury) (HCC) [N17.9] Influenza-like illness [J11.1] Type 2 diabetes mellitus with hypoglycemia without coma, unspecified whether long term insulin use (HCC) [E11.649] Patient Active Problem List   Diagnosis Date Noted   Chronic  anemia 04/11/2023   OSA (obstructive sleep apnea) 04/11/2023   Generalized weakness 04/11/2023   Eye infection, left 04/11/2023   Asthma, chronic 01/21/2022   Sinus tachycardia 01/21/2022   Hypoglycemia 05/18/2021   Fall at home, initial encounter 05/17/2021   Diabetes mellitus, type II (HCC) 05/17/2021   Mixed diabetic hyperlipidemia associated with type 2 diabetes mellitus (HCC) 05/17/2021   Acute renal failure superimposed on stage 3a chronic  kidney disease (HCC) 05/17/2021   Acute metabolic encephalopathy 05/02/2021   Anxiety    Hypothyroidism    Stage 3a chronic kidney disease (HCC) 06/26/2019   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   Essential hypertension    Hyperlipidemia    Depression    Esophageal reflux 01/24/2010   PCP:  Sherrie Mustache, MD Pharmacy[phone] [address] [place] [address] [place] [address] Phone: [phone] Fax: [phone]     Social Drivers of Health (SDOH) Social History: SDOH Screenings   Food Insecurity: No Food Insecurity (04/12/2023)  Housing: Low Risk  (04/12/2023)  Transportation Needs: No Transportation Needs (04/12/2023)  Utilities: Not At Risk (04/12/2023)  Depression (PHQ2-9): Low Risk  (04/03/2021)  Recent Concern: Depression (PHQ2-9) - Medium Risk (01/16/2021)  Social Connections: Unknown (04/12/2023)  Tobacco Use: Low Risk  (04/12/2023)   SDOH Interventions:     Readmission Risk Interventions    05/18/2021    3:56 PM  Readmission Risk Prevention Plan  Transportation Screening Complete  PCP or Specialist Appt within 5-7 Days Complete  Home Care Screening Complete  Medication Review (RN CM) Complete

## 2023-04-13 NOTE — Progress Notes (Signed)
 [facility]  ROUNDING NOTE   Subjective:   Madeline Martinez is a 78 year old female with past medical conditions including type 2 diabetes, hypertension, anemia, blindness in right eye, seizures, and chronic kidney disease stage IIIa.  Patient presents to the emergency department with complaints of shortness of breath and hypoglycemia and has been admitted for Dehydration [E86.0] Hypoglycemia [E16.2] AKI (acute kidney injury) (HCC) [N17.9] Influenza-like illness [J11.1] Type 2 diabetes mellitus with hypoglycemia without coma, unspecified whether long term insulin use (HCC) [E11.649]  Patient is known to our practice and is followed by Dr.Korrapati. Was last seen in office on 07/03/22, caregiver at bedside states they missed appt in November.   Patient seen resting in bed Caregiver at bedside. Caregiver reports appetite slowly improving, 50% of normal Patient seen later in the morning seated in chair Caregiver voices concerns of weakness.  Creatinine 2.42   Objective:  Vital signs in last 24 hours:  Temp:  [97.5 F (36.4 C)-99.2 F (37.3 C)] 97.5 F (36.4 C) (02/15 0808) Pulse Rate:  [85-100] 92 (02/15 0808) Resp:  [16-20] 20 (02/15 0808) BP: (128-148)/(71-80) 147/80 (02/15 0808) SpO2:  [96 %-100 %] 100 % (02/15 0808)  Weight change:  Filed Weights   04/10/23 1950  Weight: 82.1 kg    Intake/Output: No intake/output data recorded.   Intake/Output this shift:  Total I/O In: 120 [P.O.:120] Out: -   Physical Exam: General: NAD  Head: Normocephalic, Dry oral mucosal membranes  Eyes: Nonanicteric  Lungs:  Clear to auscultation  Heart: Regular rate and rhythm  Abdomen:  Soft, nontender  Extremities: No peripheral edema.  Neurologic: Alert  Skin: No lesions       Basic Metabolic Panel: Recent Labs  Lab 04/10/23 2009 04/11/23 1257 04/12/23 0626 04/13/23 0618  NA 137 136 140 139  K 5.8* 4.3 4.4 4.6  CL 106 103 105 106  CO2 15* 23 27 23   GLUCOSE  56* 71 129* 164*  BUN 69* 64* 57* 50*  CREATININE 3.54* 3.24* 3.01* 2.42*  CALCIUM 8.9 8.5* 8.7* 8.9  MG  --   --   --  2.3  PHOS  --   --   --  3.5    Liver Function Tests: No results for input(s): "AST", "ALT", "ALKPHOS", "BILITOT", "PROT", "ALBUMIN" in the last 168 hours. No results for input(s): "LIPASE", "AMYLASE" in the last 168 hours. No results for input(s): "AMMONIA" in the last 168 hours.  CBC: Recent Labs  Lab 04/10/23 2009 04/12/23 0626 04/13/23 0618  WBC 6.6 7.4 6.2  NEUTROABS 5.2  --   --   HGB 11.9* 11.2* 11.0*  HCT 37.9 34.3* 32.7*  MCV 100.5* 95.3 92.6  PLT 278 262 267    Cardiac Enzymes: No results for input(s): "CKTOTAL", "CKMB", "CKMBINDEX", "TROPONINI" in the last 168 hours.  BNP: Invalid input(s): "POCBNP"  CBG: Recent Labs  Lab 04/12/23 1554 04/12/23 2024 04/12/23 2315 04/13/23 0810 04/13/23 1155  GLUCAP 293* 381* 258* 162* 184*    Microbiology: Results for orders placed or performed during the hospital encounter of 04/10/23  Resp panel by RT-PCR (RSV, Flu A&B, Covid) Anterior Nasal Swab     Status: None   Collection Time: 04/10/23  7:56 PM   Specimen: Anterior Nasal Swab  Result Value Ref Range Status   SARS Coronavirus 2 by RT PCR NEGATIVE NEGATIVE Final    Comment: (NOTE) SARS-CoV-2 target nucleic acids are NOT DETECTED.  The SARS-CoV-2 RNA is generally detectable in upper respiratory specimens during  the acute phase of infection. The lowest concentration of SARS-CoV-2 viral copies this assay can detect is 138 copies/mL. A negative result does not preclude SARS-Cov-2 infection and should not be used as the sole basis for treatment or other patient management decisions. A negative result may occur with  improper specimen collection/handling, submission of specimen other than nasopharyngeal swab, presence of viral mutation(s) within the areas targeted by this assay, and inadequate number of viral copies(<138 copies/mL). A negative  result must be combined with clinical observations, patient history, and epidemiological information. The expected result is Negative.  Fact Sheet for Patients:  BloggerCourse.com  Fact Sheet for Healthcare Providers:  SeriousBroker.it  This test is no t yet approved or cleared by the [place] FDA and  has been authorized for detection and/or diagnosis of SARS-CoV-2 by FDA under an Emergency Use Authorization (EUA). This EUA will remain  in effect (meaning this test can be used) for the duration of the COVID-19 declaration under Section 564(b)(1) of the Act, 21 U.S.C.section 360bbb-3(b)(1), unless the authorization is terminated  or revoked sooner.       Influenza A by PCR NEGATIVE NEGATIVE Final   Influenza B by PCR NEGATIVE NEGATIVE Final    Comment: (NOTE) The Xpert Xpress SARS-CoV-2/FLU/RSV plus assay is intended as an aid in the diagnosis of influenza from Nasopharyngeal swab specimens and should not be used as a sole basis for treatment. Nasal washings and aspirates are unacceptable for Xpert Xpress SARS-CoV-2/FLU/RSV testing.  Fact Sheet for Patients: BloggerCourse.com  Fact Sheet for Healthcare Providers: SeriousBroker.it  This test is not yet approved or cleared by the [place] FDA and has been authorized for detection and/or diagnosis of SARS-CoV-2 by FDA under an Emergency Use Authorization (EUA). This EUA will remain in effect (meaning this test can be used) for the duration of the COVID-19 declaration under Section 564(b)(1) of the Act, 21 U.S.C. section 360bbb-3(b)(1), unless the authorization is terminated or revoked.     Resp Syncytial Virus by PCR NEGATIVE NEGATIVE Final    Comment: (NOTE) Fact Sheet for Patients: BloggerCourse.com  Fact Sheet for Healthcare Providers: SeriousBroker.it  This  test is not yet approved or cleared by the [place] FDA and has been authorized for detection and/or diagnosis of SARS-CoV-2 by FDA under an Emergency Use Authorization (EUA). This EUA will remain in effect (meaning this test can be used) for the duration of the COVID-19 declaration under Section 564(b)(1) of the Act, 21 U.S.C. section 360bbb-3(b)(1), unless the authorization is terminated or revoked.  Performed at [facility], [address] [place], [place] [address]     Coagulation Studies: No results for input(s): "LABPROT", "INR" in the last 72 hours.  Urinalysis: No results for input(s): "COLORURINE", "LABSPEC", "PHURINE", "GLUCOSEU", "HGBUR", "BILIRUBINUR", "KETONESUR", "PROTEINUR", "UROBILINOGEN", "NITRITE", "LEUKOCYTESUR" in the last 72 hours.  Invalid input(s): "APPERANCEUR"    Imaging: No results found.    Medications:    sodium chloride      allopurinol  50 mg Oral Q lunch   ascorbic acid  1,000 mg Oral TID   aspirin EC  81 mg Oral Daily   atorvastatin  80 mg Oral Daily   atropine  1 drop Right Eye BID   carvedilol  12.5 mg Oral BID   Ceftazidime  1 drop Left Eye Q2H   cholecalciferol  1,000 Units Oral Daily   cyanocobalamin  1,000 mcg Intramuscular Q1200   Followed by   Melene Muller ON 04/20/2023] vitamin B-12  1,000 mcg Oral  Daily   cycloSPORINE  1 drop Left Eye Q2H   enoxaparin (LOVENOX) injection  30 mg Subcutaneous Q24H   ferrous sulfate  325 mg Oral Daily   fluconazole  200 mg Oral Daily   FLUoxetine  40 mg Oral Daily   insulin aspart  0-5 Units Subcutaneous QHS   insulin aspart  0-6 Units Subcutaneous TID WC   levothyroxine  25 mcg Oral Q0600   mirtazapine  15 mg Oral QHS   mometasone-formoterol  2 puff Inhalation BID   pantoprazole  40 mg Oral Daily   Polyhexamethylene Biguanide  1 drop Left Eye Q2H   prednisoLONE acetate  1 drop Right Eye BID   predniSONE  20 mg Oral Q breakfast   pregabalin  75 mg Oral BID   Vancomycin HCl  1  drop Left Eye Q2H   acetaminophen **OR** acetaminophen, dextrose, guaiFENesin-dextromethorphan, ipratropium-albuterol, LORazepam, morphine injection, ondansetron **OR** ondansetron (ZOFRAN) IV, oxyCODONE  Assessment/ Plan:  Madeline Martinez is a 78 y.o.  female with past medical conditions including type 2 diabetes, hypertension, anemia, blindness in right eye, seizures, and chronic kidney disease stage IIIa.  Patient presents to the emergency department with complaints of shortness of breath and hypoglycemia and has been admitted for Dehydration [E86.0] Hypoglycemia [E16.2] AKI (acute kidney injury) (HCC) [N17.9] Influenza-like illness [J11.1] Type 2 diabetes mellitus with hypoglycemia without coma, unspecified whether long term insulin use (HCC) [E11.649]   Acute Kidney Injury with hyperkalemia on chronic kidney disease stage IIIa with baseline creatinine 1.3 and GFR of 43 on 02/14/23.  Acute kidney injury secondary to poor oral intake resulting in dehydration combined with continued nephrotoxic medications.  Losartan and Jardiance. Renal ultrasound negative for hydronephrosis, simple renal cyst noted.    Potassium remains stable, 4.6.  Renal function continues to improve with IV fluids and improving oral intake.  No acute indication for dialysis.  Will defer discharge plan to primary team, will arrange follow-up appointment in our office.  Lab Results  Component Value Date   CREATININE 2.42 (H) 04/13/2023   CREATININE 3.01 (H) 04/12/2023   CREATININE 3.24 (H) 04/11/2023    Intake/Output Summary (Last 24 hours) at 04/13/2023 1309 Last data filed at 04/13/2023 0900 Gross per 24 hour  Intake 120 ml  Output --  Net 120 ml     2. Acute metabolic acidosis, S bicarb corrected   3. Anemia of chronic kidney disease Lab Results  Component Value Date   HGB 11.0 (L) 04/13/2023    Hgb 11.0, stable. Will monitor for now.   4. Hypertension with chronic kidney disease. Currently  receiving carvedilol. Blood pressure acceptable for this patient.   LOS: 3 Madeline Martinez 2/15/20251:09 PM

## 2023-04-13 NOTE — Progress Notes (Signed)
 Patient's daughter, no longer at bedside, aware pt is out of some of her eye drops. Per day shift RN, daughter to bring eye drops when she returns tonight.

## 2023-04-14 DIAGNOSIS — E162 Hypoglycemia, unspecified: Secondary | ICD-10-CM | POA: Diagnosis not present

## 2023-04-14 LAB — CBC
HCT: 31.5 % — ABNORMAL LOW (ref 36.0–46.0)
Hemoglobin: 10.5 g/dL — ABNORMAL LOW (ref 12.0–15.0)
MCH: 31.3 pg (ref 26.0–34.0)
MCHC: 33.3 g/dL (ref 30.0–36.0)
MCV: 93.8 fL (ref 80.0–100.0)
Platelets: 292 10*3/uL (ref 150–400)
RBC: 3.36 MIL/uL — ABNORMAL LOW (ref 3.87–5.11)
RDW: 19.1 % — ABNORMAL HIGH (ref 11.5–15.5)
WBC: 5.8 10*3/uL (ref 4.0–10.5)
nRBC: 0 % (ref 0.0–0.2)

## 2023-04-14 LAB — BASIC METABOLIC PANEL
Anion gap: 10 (ref 5–15)
BUN: 40 mg/dL — ABNORMAL HIGH (ref 8–23)
CO2: 22 mmol/L (ref 22–32)
Calcium: 8.9 mg/dL (ref 8.9–10.3)
Chloride: 111 mmol/L (ref 98–111)
Creatinine, Ser: 1.88 mg/dL — ABNORMAL HIGH (ref 0.44–1.00)
GFR, Estimated: 27 mL/min — ABNORMAL LOW (ref >60)
Glucose, Bld: 154 mg/dL — ABNORMAL HIGH (ref 70–99)
Potassium: 4.4 mmol/L (ref 3.5–5.1)
Sodium: 143 mmol/L (ref 135–145)

## 2023-04-14 LAB — GLUCOSE, CAPILLARY
Glucose-Capillary: 167 mg/dL — ABNORMAL HIGH (ref 70–99)
Glucose-Capillary: 201 mg/dL — ABNORMAL HIGH (ref 70–99)
Glucose-Capillary: 321 mg/dL — ABNORMAL HIGH (ref 70–99)
Glucose-Capillary: 279 mg/dL — ABNORMAL HIGH (ref 70–99)

## 2023-04-14 MED ORDER — SODIUM CHLORIDE 0.9 % IV SOLN: INTRAVENOUS | Status: AC

## 2023-04-14 NOTE — Progress Notes (Signed)
 Progress Note   Patient: Madeline Martinez UJW:[id number] DOB: [birth year] DOA: 04/10/2023     4 DOS: the patient was seen and examined on 04/14/2023   Brief hospital course: Taken from H&P.  Nakita Santerre is a 78 y.o. 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  Rest of the ROS was negative.  She was found to have blood sugar of 33 at home.  On presentation stable vital   Respiratory viral panel negative.  Creatinine 3.54,up from 1.6 on 2/6,Potassium 5.8, bicarb 15 and anion gap 16. Serum blood glucose 56.  EKG with sinus tachycardia at 108, no acute ST-T wave changes Chest x-ray with mild chronic pulmonary vascular congestion and no acute airspace disease.  Patient received LR bolus, D50 and was subsequently started on bicarb in D5  2/13: Continue to have intermittent hypoglycemia.  A1c of 8.5, likely have AKI with Bactrim which was discontinued, result in accumulation of glipizide, metformin and Jardiance.  [name] was also listed in her medications but apparently not taking them recently.  [facility] contacted [place] as she was supposed to get a procedure for this amoebic keratitis of left eye today, initially they requested transfer, transfer was declined.  Discussed with her ophthalmologist at [facility] and they will see her early next week as outpatient.  According to them there is no urgent need for any abscess drainage.  Per ID in [facility] she will continue with fluconazole, stop Bactrim she will also continue with eyedrops. Slowly improving renal function.  Renal ultrasound without any acute abnormality. Metabolic acidosis has been resolved.  Switching to LR with D5    Assessment and  Plan: Hypoglycemia, resolved Type 2 diabetes Likely secondary to AKI with recent use of Bactrim, resulted in poor clearance of hypoglycemic agents Hold all hypoglycemic agents for now to resume when hypoglycemia is corrected Continue D50 as needed,Hypoglycemia protocol S/p LR with D5, d/c'd on 2/14, started NS 75 mL/h  Acute renal failure superimposed on stage 3a chronic kidney disease High anion gap metabolic acidosis Possibly related to recent Bactrim treatment started on 2/6 Renal ultrasound without any acute abnormality Metabolic acidosis resolved Renal function gradually improving Monitor renal function and avoid nephrotoxins Bactrim was discontinued after discussing with her ID at [facility] S/p bicarb infusion and LR and D5 2/14 started NS 75 ml/hr Creatinine 3.01--2.42--1.88 gradually improving  Eye infection, left Followed by ophthalmology and infectious diseases at [facility], last seen 2/6 Current medication as follows: Bactrim DS (Trimethoprim 160 mg-sulfamethoxazole 800mg ) 2 TABLETS BID.  Continue PO Fluconazole 400mg  once daily  Stop miltefosine 50mg  TID -as advised by ID at [facility] Ophthalmic eye drops per Ophthalmology team.    Generalized weakness Patient symptomatic for weakness-no localizing symptoms except for known eye infection respiratory viral panel negative.  Encourage oral hydration   Asthma, chronic Continue home inhalers DuoNebs as needed  Anxiety Continue home fluoxetine, mirtazapine and lorazepam  Essential hypertension Will hold losartan and HCTZ due to AKI Continue carvedilol  Diabetes mellitus, type II (HCC) Holding metformin, Cozaar, Tresiba, glipizide, Jardiance, all due to hypoglycemia Resume as appropriate  Hypothyroidism Continue levothyroxine  OSA (obstructive sleep apnea) CPAP if desired   Depression: Continue fluoxetine  # Chronic microcytic anemia due to B12 deficiency # Vitamin B12 deficiency: Started  vitamin B12 1000 mcg IM  injection daily during hospital stay, followed by oral supplement.  Follow-up PCP to repeat vitamin B12 level after 3 to 6 months.   Subjective: No significant events overnight, patient is feeling physical generalized weakness.  Still does not feel any improvement in the vision of left eye. Denied any new complaints. Discharge plan discussed with patient daughter at bedside, who does not feel to take her today due to physical weakness.  Patient was seen by PT and OT, most likely she will be going to home with home health services. Plan is to discharge her tomorrow a.m.   Physical Exam: Vitals:   04/13/23 1634 04/13/23 2051 04/14/23 0455 04/14/23 0811  BP: (!) 160/84 (!) 147/81 (!) 149/81 137/84  Pulse: 91 79 73 97  Resp: 18 18 16 18   Temp: 98.6 F (37 C)  97.6 F (36.4 C) 98.6 F (37 C)  TempSrc:      SpO2: 98%  97% 98%  Weight:      Height:       General.  Obese elderly lady, not is acute distress.  Right eye blind, left eye with significant conjunctival congestion. Pulmonary.  Lungs clear bilaterally, normal respiratory effort. CV.  Regular rate and rhythm, no JVD, rub or murmur. Abdomen.  Soft, nontender, nondistended, BS positive. CNS.  Alert and oriented .  No focal neurologic deficit. Extremities.  No edema, no cyanosis, pulses intact and symmetrical.  Data Reviewed: Prior data reviewed  Family Communication: Discussed with patient's daughter at bedside  Disposition: Status is: Inpatient Remains inpatient appropriate because: Severity of illness  Planned Discharge Destination: Home  DVT prophylaxis.  Lovenox Time spent:  35 minutes  This record has been created using Conservation officer, historic buildings. Errors have been sought and corrected,but may not always be located. Such creation errors do not reflect on the standard of care.   Author: Gillis Santa, MD 04/14/2023 3:16 PM  For on call review [email or web address].

## 2023-04-15 DIAGNOSIS — E162 Hypoglycemia, unspecified: Secondary | ICD-10-CM | POA: Diagnosis not present

## 2023-04-15 LAB — BASIC METABOLIC PANEL
Anion gap: 8 (ref 5–15)
BUN: 31 mg/dL — ABNORMAL HIGH (ref 8–23)
CO2: 22 mmol/L (ref 22–32)
Calcium: 8.7 mg/dL — ABNORMAL LOW (ref 8.9–10.3)
Chloride: 113 mmol/L — ABNORMAL HIGH (ref 98–111)
Creatinine, Ser: 1.49 mg/dL — ABNORMAL HIGH (ref 0.44–1.00)
GFR, Estimated: 36 mL/min — ABNORMAL LOW (ref >60)
Glucose, Bld: 136 mg/dL — ABNORMAL HIGH (ref 70–99)
Potassium: 4.3 mmol/L (ref 3.5–5.1)
Sodium: 143 mmol/L (ref 135–145)

## 2023-04-15 LAB — CBC
HCT: 31.5 % — ABNORMAL LOW (ref 36.0–46.0)
Hemoglobin: 10.5 g/dL — ABNORMAL LOW (ref 12.0–15.0)
MCH: 31.3 pg (ref 26.0–34.0)
MCHC: 33.3 g/dL (ref 30.0–36.0)
MCV: 94 fL (ref 80.0–100.0)
Platelets: 317 10*3/uL (ref 150–400)
RBC: 3.35 MIL/uL — ABNORMAL LOW (ref 3.87–5.11)
RDW: 19.6 % — ABNORMAL HIGH (ref 11.5–15.5)
WBC: 6.7 10*3/uL (ref 4.0–10.5)
nRBC: 0 % (ref 0.0–0.2)

## 2023-04-15 LAB — GLUCOSE, CAPILLARY
Glucose-Capillary: 125 mg/dL — ABNORMAL HIGH (ref 70–99)
Glucose-Capillary: 216 mg/dL — ABNORMAL HIGH (ref 70–99)

## 2023-04-15 MED ORDER — LOSARTAN POTASSIUM 50 MG PO TABS
50.0000 mg | ORAL_TABLET | Freq: Every day | ORAL | Status: DC
  Administered 2023-04-15: 50 mg via ORAL
  Filled 2023-04-15: qty 1

## 2023-04-15 MED ORDER — CYANOCOBALAMIN 1000 MCG PO TABS: 1000.0000 ug | ORAL_TABLET | Freq: Every day | ORAL | 0 refills | Status: AC

## 2023-04-15 NOTE — Progress Notes (Signed)
 Patient remains resting in bed.  Has remained free from any noted signs of acute distress.  Has tolerated having eye drops administered per MD order.  (L) eye continued to exhibit, small amount of edema, with red hue to sclera.  No noted signs of active draining to (L) eye upon inspection.  Patient has not requested any prn medications for symptom management.  Patient to be monitored until discharged.

## 2023-04-15 NOTE — Progress Notes (Signed)
 Physical Therapy Treatment Patient Details Name: Natori Gudino MRN: [id number] DOB: 1945/08/26 Today's Date: 04/15/2023   History of Present Illness Jacora Hopkins is a 78 year old female with past medical conditions including type 2 diabetes, hypertension, anemia, blindness in right eye, seizures, and chronic kidney disease stage IIIa.  Patient presents to the emergency department with complaints of shortness of breath and hypoglycemia    PT Comments  Pt in bed.  Inc urine.  To EOB with min a x 1.  Stands and attempts to walk to bathroom but fatigues and unsafe to walk further after 6'.    She is able to transfer from recliner to [facility] with min a x 1 for large void and BM along with bath and new clothing.  She transfers back to recliner with min a x 1 then is able to walk 5' forward and 2' backwards with RW and min a x 1.  Extended time spent with pt and daughter.  Daughter is primary caregiver and is struggling at home with her care.  Discussed ways to make transition home safety and to focus more on transfer and use of wheelchair at home for mobility until strength increases. They do have all equipment needed at home but are given a gait belt for safety.  Daughter does not want SNF at this time for rehab.  Will benefit from HHPT/OT and any aide services and supports she can qualify for to support her daughter.     If plan is discharge home, recommend the following: A little help with walking and/or transfers;A little help with bathing/dressing/bathroom;Assist for transportation;Help with stairs or ramp for entrance;Assistance with cooking/housework   Can travel by private vehicle        Equipment Recommendations       Recommendations for Other Services       Precautions / Restrictions Precautions Precautions: Fall Precaution/Restrictions Comments: visually impaired-does intermittently see shapes, light to L eye Restrictions Weight Bearing Restrictions Per Provider Order: No      Mobility  Bed Mobility Overal bed mobility: Needs Assistance Bed Mobility: Supine to Sit     Supine to sit: Min assist       Patient Response: Cooperative  Transfers Overall transfer level: Needs assistance Equipment used: Rolling walker (2 wheels) Transfers: Sit to/from Stand Sit to Stand: Min assist                Ambulation/Gait Ambulation/Gait assistance: Editor, commissioning (Feet): 6 Feet Assistive device: Rolling walker (2 wheels) Gait Pattern/deviations: Step-through pattern, Decreased step length - right, Decreased step length - left, Wide base of support, Trunk flexed Gait velocity: decreased     General Gait Details: 6'when LE weakness made gait unsafe   Stairs             Wheelchair Mobility     Tilt Bed Tilt Bed Patient Response: Cooperative  Modified Rankin (Stroke Patients Only)       Balance Overall balance assessment: Needs assistance Sitting-balance support: Feet supported, No upper extremity supported Sitting balance-Leahy Scale: Good     Standing balance support: Bilateral upper extremity supported Standing balance-Leahy Scale: Fair                              Communication    Cognition Arousal: Alert Behavior During Therapy: WFL for tasks assessed/performed   PT - Cognitive impairments: No apparent impairments  Cueing    Exercises Other Exercises Other Exercises: BSC to void and large BM and bathing.    General Comments        Pertinent Vitals/Pain Pain Assessment Pain Assessment: Faces Faces Pain Scale: Hurts a little bit Pain Intervention(s): Limited activity within patient's tolerance, Monitored during session, Repositioned    Home Living                          Prior Function            PT Goals (current goals can now be found in the care plan section) Progress towards PT goals: Progressing toward goals     Frequency    Min 1X/week      PT Plan      Co-evaluation              AM-PAC PT "6 Clicks" Mobility   Outcome Measure  Help needed turning from your back to your side while in a flat bed without using bedrails?: None Help needed moving from lying on your back to sitting on the side of a flat bed without using bedrails?: A Little Help needed moving to and from a bed to a chair (including a wheelchair)?: A Little Help needed standing up from a chair using your arms (e.g., wheelchair or bedside chair)?: A Little Help needed to walk in hospital room?: A Lot Help needed climbing 3-5 steps with a railing? : A Lot 6 Click Score: 17    End of Session Equipment Utilized During Treatment: Gait belt Activity Tolerance: Patient tolerated treatment well;Patient limited by fatigue Patient left: in chair;with call bell/phone within reach;with chair alarm set;with family/visitor present Nurse Communication: Mobility status PT Visit Diagnosis: Muscle weakness (generalized) (M62.81);Difficulty in walking, not elsewhere classified (R26.2)     Time: 4098-1191 PT Time Calculation (min) (ACUTE ONLY): 44 min  Charges:    $Gait Training: 23-37 mins $Therapeutic Activity: 8-22 mins PT General Charges $$ ACUTE PT VISIT: 1 Visit                   Danielle Dess, PTA 04/15/23, 10:07 AM

## 2023-04-15 NOTE — Discharge Summary (Signed)
 [facility] Discharge Summary   Patient: Madeline Martinez WUJ:[id number]  PCP: Sherrie Mustache, MD  Date of admission: 04/10/2023   Date of discharge:  04/15/2023     Discharge Diagnoses:  Principal Problem:   Hypoglycemia Active Problems:   Acute renal failure superimposed on stage 3a chronic kidney disease (HCC)   Generalized weakness   Eye infection, left   Essential hypertension   Anxiety   Asthma, chronic   Diabetes mellitus, type II (HCC)   Hypothyroidism   Depression   Chronic anemia   OSA (obstructive sleep apnea)   Admitted From: Home Disposition:  Home with [facility] services  Recommendations for Outpatient Follow-up:  F/u with PCP in 1 wk, BMP in 1 wk F/u with Nephrologist in 1 wk F/u with Ophthalmologist in 1 wk Follow up LABS/TEST:  BMP in 1 wk   Follow-up Information     Sherrie Mustache, MD Follow up in 1 week(s).   Specialty: Internal Medicine Contact information: [address] [place] [address] [phone]         Mosetta Pigeon, MD Follow up in 1 week(s).   Specialty: Nephrology Contact information: [address] [place] [address] [phone]                Diet recommendation: Cardiac and Carb modified diet  Activity: The patient is advised to gradually reintroduce usual activities, as tolerated  Discharge Condition: stable  Code Status: Full code   History of present illness: As per the H and P dictated on admission Hospital Course:  Madeline Martinez is a 78 y.o. 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  Rest of the ROS was negative.   She was found to have blood sugar of 33 at home.  On  presentation stable vital   Respiratory viral panel negative.  Creatinine 3.54,up from 1.6 on 2/6,Potassium 5.8, bicarb 15 and anion gap 16. Serum blood glucose 56.  EKG with sinus tachycardia at 108, no acute ST-T wave changes Chest x-ray with mild chronic pulmonary vascular congestion and no acute airspace disease.   Patient received LR bolus, D50 and was subsequently started on bicarb in D5   2/13: Continue to have intermittent hypoglycemia.  A1c of 8.5, likely have AKI with Bactrim which was discontinued, result in accumulation of glipizide, metformin and Jardiance.  [name] was also listed in her medications but apparently not taking them recently.  [facility] contacted [place] as she was supposed to get a procedure for this amoebic keratitis of left eye today, initially they requested transfer, transfer was declined.  Discussed with her ophthalmologist at [facility] and they will see her early next week as outpatient.  According to them there is no urgent need for any abscess drainage.  Per ID in [facility] she will continue with fluconazole, stop Bactrim she will also continue with eyedrops. Slowly improving renal function.  Renal ultrasound without any acute abnormality. Metabolic acidosis has been resolved.  Switching to LR with D5      Assessment and Plan: # Hypoglycemia, resolved # Type 2 diabetes Likely secondary to AKI with recent use of Bactrim, resulted in poor clearance of hypoglycemic agents Held all hypoglycemic agents were held during hospital stay.  S/p D50 as needed,  and hypoglycemia protocol. S/p LR with D5, d/c'd on 2/14, started NS 75 mL/h during hospital stay for AKI.  Resumed home medications on discharge.  Patient was advised to monitor CBG, continue diabetic diet.  Follow with PCP.   # Acute renal failure superimposed on stage 3a chronic kidney disease #High anion gap metabolic acidosis Possibly related to recent Bactrim treatment started on 2/6 Renal ultrasound without any acute  abnormality Metabolic acidosis resolved. Renal function gradually improving Bactrim was discontinued after discussing with her ID at [facility] S/p bicarb infusion and LR and D5 and on 2/14 started NS 75 ml/hr Creatinine 3.01--2.42--1.49 gradually improving.  Continue oral hydration and follow-up with nephrology, repeat BMP in 1 week.   # Eye infection, left Followed by ophthalmology and infectious diseases at [facility], last seen 2/6 Current medication as follows: Bactrim DS (Trimethoprim 160 mg-sulfamethoxazole 800mg ) 2 TABLETS BID.  Which has been discontinued due to acute renal failure. Continue PO Fluconazole 400mg  once daily  Stop miltefosine 50mg  TID -as advised by ID at [facility] Ophthalmic eye drops per Ophthalmology team.   Resumed all medications except Bactrim on discharge and recommended to follow-up with ophthalmologist 1 week.  # Generalized weakness Patient symptomatic for weakness-no localizing symptoms except for known eye infection. respiratory viral panel negative. Encourage oral hydration # Asthma, chronic, Continue home inhalers, DuoNebs as needed # Anxiety, Continue home fluoxetine, mirtazapine and lorazepam # Essential hypertension, held losartan and HCTZ due to AKI Continued carvedilol.  Resumed losartan 50 mg p.o. daily instead of 100 mg.  Patient was advised to monitor BP at home and follow-up with nephrology and titrate dose of losartan accordingly.  Held hydrochlorothiazide for now to be used as needed for lower extremity edema as per nephrology.  Repeat BMP in 1 week # Diabetes mellitus, type II Held metformin, Cozaar, glipizide, Jardiance, all due to hypoglycemia.  Resumed home regimen on discharge.  Patient was advised to monitor CBG Continue diabetic diet and follow with PCP. # Hypothyroidism. Continue levothyroxine # OSA (obstructive sleep apnea) CPAP if desired # Depression: Continue fluoxetine # Chronic microcytic anemia due to B12 deficiency # Vitamin B12 deficiency:  Started vitamin B12 1000 mcg IM injection daily during hospital stay, followed by oral supplement.  Follow-up PCP to repeat vitamin B12 level after 3 to 6 months.    Body mass index is 35.35 kg/m.  Nutrition Interventions:  - Patient was instructed, not to drive, operate heavy machinery, perform activities at heights, swimming or participation in water activities or provide baby sitting services while on Pain, Sleep and Anxiety Medications; until her outpatient Physician has advised to do so again.  - Also recommended to not to take more than prescribed Pain, Sleep and Anxiety Medications.  Patient was seen by physical therapy, who recommended Home health, which was arranged. On the day of the discharge the patient's vitals were stable, and no other acute medical condition were reported by patient. the patient was felt safe to be discharge at Home with Home health.  Consultants: Nephrology Procedures: None  Discharge Exam: General: Appear in no distress, no Rash; Oral Mucosa Clear, moist. Cardiovascular: S1 and S2 Present, no Murmur, Respiratory: normal respiratory effort, Bilateral Air entry present and no Crackles, no wheezes Abdomen: Bowel Sound present, Soft and no tenderness, no hernia Extremities: no Pedal edema, no calf tenderness Neurology: alert and oriented to time, place, and person affect appropriate.  Filed Weights   04/10/23 1950  Weight: 82.1 kg   Vitals:   04/14/23 2214  04/15/23 0451  BP: (!) 167/84 (!) 154/90  Pulse: 72 79  Resp:  16  Temp:  98 F (36.7 C)  SpO2:  96%    DISCHARGE MEDICATION: Allergies as of 04/15/2023   No Known Allergies      Medication List     STOP taking these medications    Dupixent 300 MG/2ML Soaj Generic drug: Dupilumab   promethazine-dextromethorphan 6.25-15 MG/5ML syrup Commonly known as: PROMETHAZINE-DM   SULFAMETHOXAZOLE-TRIMETHOPRIM PO   Tresiba FlexTouch 100 UNIT/ML FlexTouch Pen Generic drug: insulin degludec        TAKE these medications    Advair HFA 115-21 MCG/ACT inhaler Generic drug: fluticasone-salmeterol Inhale 2 puffs into the lungs 2 (two) times daily.   albuterol 108 (90 Base) MCG/ACT inhaler Commonly known as: VENTOLIN HFA Inhale 2 puffs into the lungs every 4 (four) hours as needed for wheezing or shortness of breath.   albuterol (2.5 MG/3ML) 0.083% nebulizer solution Commonly known as: PROVENTIL Take 3 mLs (2.5 mg total) by nebulization every 4 (four) hours as needed for wheezing or shortness of breath (please include nebulizer machine, hoses, and mask if needed.).   allopurinol 100 MG tablet Commonly known as: ZYLOPRIM Take 100 mg by mouth daily with lunch.   ascorbic acid 1000 MG tablet Commonly known as: VITAMIN C Take 1,000 mg by mouth 3 (three) times daily.   aspirin EC 81 MG tablet Take 81 mg by mouth daily.   atorvastatin 80 MG tablet Commonly known as: LIPITOR Take 80 mg by mouth daily.   atropine 1 % ophthalmic solution Place 1 drop into the right eye 2 (two) times daily.   Biotin 5000 MCG Tabs Take 5,000 mcg by mouth daily. HAIR/SKIN/NAILS   carvedilol 12.5 MG tablet Commonly known as: COREG Take 12.5 mg by mouth 2 (two) times daily.   cetirizine 10 MG tablet Commonly known as: ZYRTEC Take 10 mg by mouth daily.   cyanocobalamin 1000 MCG tablet Take 1 tablet (1,000 mcg total) by mouth daily. Start taking on: April 20, 2023   empagliflozin 10 MG Tabs tablet Commonly known as: JARDIANCE Take 10 mg by mouth daily.   ferrous sulfate 325 (65 FE) MG tablet Take 325 mg by mouth daily.   fluconazole 200 MG tablet Commonly known as: DIFLUCAN Take 200 mg by mouth daily.   FLUoxetine 40 MG capsule Commonly known as: PROZAC Take 40 mg by mouth daily.   glipiZIDE 5 MG 24 hr tablet Commonly known as: GLUCOTROL XL Take 1 tablet (5 mg total) by mouth daily.   hydrochlorothiazide 25 MG tablet Commonly known as: HYDRODIURIL Take 1 tablet (25 mg  total) by mouth daily as needed (lower extremity edema). What changed:  when to take this reasons to take this   HYDROcodone-acetaminophen 10-325 MG tablet Commonly known as: NORCO Take 1 tablet by mouth every 8 (eight) hours as needed.   levothyroxine 25 MCG tablet Commonly known as: SYNTHROID Take 25 mcg by mouth daily.   LORazepam 1 MG tablet Commonly known as: ATIVAN Take 1 tablet (1 mg total) by mouth 2 (two) times daily as needed for anxiety.   losartan 100 MG tablet Commonly known as: COZAAR Take 0.5 tablets (50 mg total) by mouth daily. Dose decreased from 100 mg to 50 mg po daily. Monitor BP and increase dose when AKI resolve What changed:  how much to take additional instructions   metFORMIN 1000 MG tablet Commonly known as: GLUCOPHAGE Take 1,000 mg by mouth 2 (two) times daily.  metoCLOPramide 10 MG tablet Commonly known as: REGLAN Take 1 tablet (10 mg total) by mouth every 8 (eight) hours as needed for up to 14 days for nausea.   Miltefosine 50 MG Caps Take 50 mg by mouth in the morning, at noon, and at bedtime.   mirabegron ER 50 MG Tb24 tablet Commonly known as: MYRBETRIQ Take 1 tablet (50 mg total) by mouth daily.   mirtazapine 15 MG tablet Commonly known as: REMERON Take 15 mg by mouth at bedtime.   omeprazole 20 MG capsule Commonly known as: PRILOSEC Take 20 mg by mouth daily as needed.   prednisoLONE acetate 1 % ophthalmic suspension Commonly known as: PRED FORTE Place 1 drop into the right eye 2 (two) times daily.   predniSONE 20 MG tablet Commonly known as: DELTASONE Take 20 mg by mouth daily with breakfast.   pregabalin 75 MG capsule Commonly known as: LYRICA Take 1 capsule (75 mg total) by mouth 2 (two) times daily.   PRESCRIPTION MEDICATION Place 1 drop into the left eye 6 (six) times daily.   PRESCRIPTION MEDICATION Place 1 drop into the left eye 6 (six) times daily.   PRESCRIPTION MEDICATION Place 1 drop into the left eye 6  (six) times daily.   PRESCRIPTION MEDICATION Place 1 drop into the left eye 6 (six) times daily.   PROBIOTIC PO Take 1 tablet by mouth daily.   Vitamin D-3 125 MCG (5000 UT) Tabs Take 5,000 Units by mouth daily.   vitamin E 45 MG (100 UNITS) capsule Take by mouth.       No Known Allergies Discharge Instructions     Call MD for:  difficulty breathing, headache or visual disturbances   Complete by: As directed    Call MD for:  extreme fatigue   Complete by: As directed    Call MD for:  persistant dizziness or light-headedness   Complete by: As directed    Call MD for:  persistant nausea and vomiting   Complete by: As directed    Call MD for:  redness, tenderness, or signs of infection (pain, swelling, redness, odor or green/yellow discharge around incision site)   Complete by: As directed    Call MD for:  severe uncontrolled pain   Complete by: As directed    Call MD for:  temperature >100.4   Complete by: As directed    Diet - low sodium heart healthy   Complete by: As directed    Discharge instructions   Complete by: As directed    F/u with PCP in 1 wk, BMP in 1 wk F/u with Nephrologist in 1 wk F/u with Ophthalmologist in 1 wk   Increase activity slowly   Complete by: As directed        The results of significant diagnostics from this hospitalization (including imaging, microbiology, ancillary and laboratory) are listed below for reference.    Significant Diagnostic Studies: US RENAL Result Date: 04/11/2023 CLINICAL DATA:  Acute kidney injury EXAM: RENAL / URINARY TRACT ULTRASOUND COMPLETE COMPARISON:  CT abdomen and pelvis dated 05/02/2021 FINDINGS: Right Kidney: Length = 10.2 cm Normal parenchymal echogenicity with preserved corticomedullary differentiation. Upper pole simple cyst measures 5.9 cm. Additional subcentimeter hypodensities on prior CT abdomen are not well seen on this examination. No urinary tract dilation or shadowing calculi. The ureter is not seen.  Left Kidney: Length = 10.2 cm Normal parenchymal echogenicity with preserved corticomedullary differentiation. Interpolar simple cyst measures 2.2 cm. Additional subcentimeter hypodensities on prior CT abdomen  are not well seen on this examination. No urinary tract dilation or shadowing calculi. The ureter is not seen. Bladder: Appears normal for degree of bladder distention. Other: None. IMPRESSION: 1. No urinary tract dilation or shadowing calculi. 2. Bilateral simple renal cysts. Electronically Signed   By: Agustin Cree M.D.   On: 04/11/2023 09:42   DG Chest 1 View Result Date: 04/10/2023 CLINICAL DATA:  Short of breath, fatigue, hypoglycemia EXAM: CHEST  1 VIEW COMPARISON:  01/21/2022 FINDINGS: 2 frontal views of the chest demonstrate a stable cardiac silhouette. No airspace disease, effusion, or pneumothorax. Mild chronic central vascular congestion. No acute bony abnormalities. IMPRESSION: 1. Mild chronic pulmonary vascular congestion. No acute airspace disease. Electronically Signed   By: Sharlet Salina M.D.   On: 04/10/2023 20:43    Microbiology: Recent Results (from the past 240 hours)  Resp panel by RT-PCR (RSV, Flu A&B, Covid) Anterior Nasal Swab     Status: None   Collection Time: 04/10/23  7:56 PM   Specimen: Anterior Nasal Swab  Result Value Ref Range Status   SARS Coronavirus 2 by RT PCR NEGATIVE NEGATIVE Final    Comment: (NOTE) SARS-CoV-2 target nucleic acids are NOT DETECTED.  The SARS-CoV-2 RNA is generally detectable in upper respiratory specimens during the acute phase of infection. The lowest concentration of SARS-CoV-2 viral copies this assay can detect is 138 copies/mL. A negative result does not preclude SARS-Cov-2 infection and should not be used as the sole basis for treatment or other patient management decisions. A negative result may occur with  improper specimen collection/handling, submission of specimen other than nasopharyngeal swab, presence of viral mutation(s)  within the areas targeted by this assay, and inadequate number of viral copies(<138 copies/mL). A negative result must be combined with clinical observations, patient history, and epidemiological information. The expected result is Negative.  Fact Sheet for Patients:  BloggerCourse.com  Fact Sheet for Healthcare Providers:  SeriousBroker.it  This test is no t yet approved or cleared by the [place] FDA and  has been authorized for detection and/or diagnosis of SARS-CoV-2 by FDA under an Emergency Use Authorization (EUA). This EUA will remain  in effect (meaning this test can be used) for the duration of the COVID-19 declaration under Section 564(b)(1) of the Act, 21 U.S.C.section 360bbb-3(b)(1), unless the authorization is terminated  or revoked sooner.       Influenza A by PCR NEGATIVE NEGATIVE Final   Influenza B by PCR NEGATIVE NEGATIVE Final    Comment: (NOTE) The Xpert Xpress SARS-CoV-2/FLU/RSV plus assay is intended as an aid in the diagnosis of influenza from Nasopharyngeal swab specimens and should not be used as a sole basis for treatment. Nasal washings and aspirates are unacceptable for Xpert Xpress SARS-CoV-2/FLU/RSV testing.  Fact Sheet for Patients: BloggerCourse.com  Fact Sheet for Healthcare Providers: SeriousBroker.it  This test is not yet approved or cleared by the [place] FDA and has been authorized for detection and/or diagnosis of SARS-CoV-2 by FDA under an Emergency Use Authorization (EUA). This EUA will remain in effect (meaning this test can be used) for the duration of the COVID-19 declaration under Section 564(b)(1) of the Act, 21 U.S.C. section 360bbb-3(b)(1), unless the authorization is terminated or revoked.     Resp Syncytial Virus by PCR NEGATIVE NEGATIVE Final    Comment: (NOTE) Fact Sheet for  Patients: BloggerCourse.com  Fact Sheet for Healthcare Providers: SeriousBroker.it  This test is not yet approved or cleared by the [place] FDA and has been  authorized for detection and/or diagnosis of SARS-CoV-2 by FDA under an Emergency Use Authorization (EUA). This EUA will remain in effect (meaning this test can be used) for the duration of the COVID-19 declaration under Section 564(b)(1) of the Act, 21 U.S.C. section 360bbb-3(b)(1), unless the authorization is terminated or revoked.  Performed at [facility], [address] [place], [place] [address]      Labs: CBC: Recent Labs  Lab 04/10/23 2009 04/12/23 1914 04/13/23 0618 04/14/23 0529 04/15/23 0657  WBC 6.6 7.4 6.2 5.8 6.7  NEUTROABS 5.2  --   --   --   --   HGB 11.9* 11.2* 11.0* 10.5* 10.5*  HCT 37.9 34.3* 32.7* 31.5* 31.5*  MCV 100.5* 95.3 92.6 93.8 94.0  PLT 278 262 267 292 317   Basic Metabolic Panel: Recent Labs  Lab 04/11/23 1257 04/12/23 0626 04/13/23 0618 04/14/23 0529 04/15/23 0657  NA 136 140 139 143 143  K 4.3 4.4 4.6 4.4 4.3  CL 103 105 106 111 113*  CO2 23 27 23 22 22   GLUCOSE 71 129* 164* 154* 136*  BUN 64* 57* 50* 40* 31*  CREATININE 3.24* 3.01* 2.42* 1.88* 1.49*  CALCIUM 8.5* 8.7* 8.9 8.9 8.7*  MG  --   --  2.3  --   --   PHOS  --   --  3.5  --   --    Liver Function Tests: No results for input(s): "AST", "ALT", "ALKPHOS", "BILITOT", "PROT", "ALBUMIN" in the last 168 hours. No results for input(s): "LIPASE", "AMYLASE" in the last 168 hours. No results for input(s): "AMMONIA" in the last 168 hours. Cardiac Enzymes: No results for input(s): "CKTOTAL", "CKMB", "CKMBINDEX", "TROPONINI" in the last 168 hours. BNP (last 3 results) No results for input(s): "BNP" in the last 8760 hours. CBG: Recent Labs  Lab 04/14/23 0826 04/14/23 1155 04/14/23 1640 04/14/23 2130 04/15/23 0734  GLUCAP 167* 201* 321* 279* 125*     Time spent: 35 minutes  Signed:  Gillis Santa  [facility] 04/15/2023 10:41 AM

## 2023-04-15 NOTE — Care Management Important Message (Signed)
 Important Message  Patient Details  Name: Madeline Martinez MRN: [id number] Date of Birth: [birth year]   Important Message Given:  Yes - Medicare IM     Tateanna Bach W, CMA 04/15/2023, 10:10 AM

## 2023-04-15 NOTE — TOC Transition Note (Signed)
 Transition of Care [facility]) - Discharge Note   Patient Details  Name: Madeline Martinez MRN: [id number] Date of Birth: [birth year]  [facility] CM/SW Contact:  Allena Katz, LCSW Phone Number: 04/15/2023, 11:22 AM   Clinical Narrative:   Pt discharging home with [facility] PT/OT/AIDE through [facility]. No other needs.     Final next level of care: Home w Home Health Services Barriers to Discharge: Barriers Resolved   Patient Goals and CMS Choice Patient states their goals for this hospitalization and ongoing recovery are:: Home with home health CMS Medicare.gov Compare Post Acute Care list provided to:: Patient Choice offered to / list presented to : Adult Children      Discharge Placement                       Discharge Plan and Services Additional resources added to the After Visit Summary for       Post Acute Care Choice: Home Health                    HH Arranged: PT, OT, Nurse's Aide HH Agency: [facility] Date [facility] Agency Contacted: 04/13/23 Time HH Agency Contacted: 0930 Representative spoke with at [facility] Agency: cheryl  Social Drivers of Health (SDOH) Interventions SDOH Screenings   Food Insecurity: No Food Insecurity (04/12/2023)  Housing: Low Risk  (04/12/2023)  Transportation Needs: No Transportation Needs (04/12/2023)  Utilities: Not At Risk (04/12/2023)  Depression (PHQ2-9): Low Risk  (04/03/2021)  Recent Concern: Depression (PHQ2-9) - Medium Risk (01/16/2021)  Social Connections: Unknown (04/12/2023)  Tobacco Use: Low Risk  (04/12/2023)     Readmission Risk Interventions    05/18/2021    3:56 PM  Readmission Risk Prevention Plan  Transportation Screening Complete  PCP or Specialist Appt within 5-7 Days Complete  Home Care Screening Complete  Medication Review (RN CM) Complete

## 2023-05-22 ENCOUNTER — Ambulatory Visit
Admission: RE | Admit: 2023-05-22 | Discharge: 2023-05-22 | Disposition: A | Discharge status: Home / Self Care | Source: Ambulatory Visit | Attending: Internal Medicine | Admitting: Internal Medicine

## 2023-05-22 DIAGNOSIS — Z1231 Encounter for screening mammogram for malignant neoplasm of breast: Secondary | ICD-10-CM | POA: Insufficient documentation

## 2023-07-15 ENCOUNTER — Encounter: Admitting: Oncology

## 2023-07-26 ENCOUNTER — Inpatient Hospital Stay

## 2023-07-26 ENCOUNTER — Inpatient Hospital Stay: Attending: Oncology | Admitting: Oncology

## 2023-07-26 ENCOUNTER — Encounter: Payer: Self-pay | Admitting: Oncology

## 2023-07-26 VITALS — HR 100 | Temp 98.8°F | Resp 19 | Ht 60.0 in | Wt 181.0 lb

## 2023-07-26 DIAGNOSIS — D75839 Thrombocytosis, unspecified: Secondary | ICD-10-CM | POA: Insufficient documentation

## 2023-07-26 DIAGNOSIS — E1122 Type 2 diabetes mellitus with diabetic chronic kidney disease: Secondary | ICD-10-CM | POA: Insufficient documentation

## 2023-07-26 DIAGNOSIS — N1831 Chronic kidney disease, stage 3a: Secondary | ICD-10-CM | POA: Diagnosis not present

## 2023-07-26 DIAGNOSIS — D649 Anemia, unspecified: Secondary | ICD-10-CM | POA: Insufficient documentation

## 2023-07-26 LAB — VITAMIN B12: Vitamin B-12: 248 pg/mL (ref 180–914)

## 2023-07-26 LAB — RETICULOCYTES
Immature Retic Fract: 5.3 % (ref 2.3–15.9)
RBC.: 3.55 MIL/uL — ABNORMAL LOW (ref 3.87–5.11)
Retic Count, Absolute: 38 10*3/uL (ref 19.0–186.0)
Retic Ct Pct: 1.1 % (ref 0.4–3.1)

## 2023-07-26 LAB — IRON AND TIBC
Iron: 36 ug/dL (ref 28–170)
Saturation Ratios: 13 % (ref 10.4–31.8)
TIBC: 287 ug/dL (ref 250–450)
UIBC: 251 ug/dL

## 2023-07-26 LAB — FERRITIN: Ferritin: 82 ng/mL (ref 11–307)

## 2023-07-26 LAB — LACTATE DEHYDROGENASE: LDH: 96 U/L — ABNORMAL LOW (ref 98–192)

## 2023-07-26 LAB — FOLATE: Folate: >40 ng/mL (ref >5.9)

## 2023-07-26 NOTE — Progress Notes (Signed)
 Hematology/Oncology Consult note [facility] [phone] [phone]  Patient Care Team: Annelle Kiel, MD as PCP - General (Internal Medicine) Avonne Boettcher, MD as Consulting Physician (Hematology and Oncology)   Name of the patient: Madeline Martinez  [id number]  10-10-1945    Reason for referral-anemia and thrombocytopenia   Referring physician-Dr. Tresia Fruit   Date of visit: 07/26/23   History of presenting illness-patient is a 78 year old female With a past medical history significant for hypertension hyperlipidemia type 2 diabetes obstructive sleep apnea hypothyroidism depression and GERD among other medical problems.  She has been referred for anemia and thrombocytopenia.  Labs from 06/28/2023 showed serum creatinine of 1.7.  Total protein normal at 6.6.  LFTs normal.  White count was normal at 8.6, H&H of 10.1/32.2 with an MCV of 95.3.  Platelets mildly elevated at 532.  B12 levels normal at 302.  TSH normal at 1.83.  A1c elevated at 7.5.  Looking back at her prior CBCs her hemoglobin was around 12 up until early February 2025 and following that it drifted down to the tens.  Around the same time her creatinine has been between 1.5-1.7.  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  She is scheduled to undergo cataract surgery now that her infection has healed in a couple of weeks time  ECOG PS- 2  Pain scale- 0   Review of systems- Review of Systems  Constitutional:  Positive for malaise/fatigue. Negative for chills, fever and weight loss.  HENT:  Negative for congestion, ear discharge and nosebleeds.   Eyes:  Negative for blurred vision.  Respiratory:  Negative for cough, hemoptysis, sputum production, shortness of breath and wheezing.   Cardiovascular:  Negative for chest pain, palpitations, orthopnea and claudication.   Gastrointestinal:  Negative for abdominal pain, blood in stool, constipation, diarrhea, heartburn, melena, nausea and vomiting.  Genitourinary:  Negative for dysuria, flank pain, frequency, hematuria and urgency.  Musculoskeletal:  Negative for back pain, joint pain and myalgias.  Skin:  Negative for rash.  Neurological:  Negative for dizziness, tingling, focal weakness, seizures, weakness and headaches.  Endo/Heme/Allergies:  Does not bruise/bleed easily.  Psychiatric/Behavioral:  Negative for depression and suicidal ideas. The patient does not have insomnia.     Allergies  Allergen Reactions   Other Other (See Comments)    Seasonal allergy    Patient Active Problem List   Diagnosis Date Noted   Chronic anemia 04/11/2023   OSA (obstructive sleep apnea) 04/11/2023   Generalized weakness 04/11/2023   Eye infection, left 04/11/2023   Asthma, chronic 01/21/2022   Sinus tachycardia 01/21/2022   Hypoglycemia 05/18/2021   Fall at home, initial encounter 05/17/2021   Diabetes mellitus, type II (HCC) 05/17/2021   Mixed diabetic hyperlipidemia associated with type 2 diabetes mellitus (HCC) 05/17/2021   Acute renal failure superimposed on stage 3a chronic kidney disease (HCC) 05/17/2021   Acute metabolic encephalopathy 05/02/2021   Anxiety    Hypothyroidism    Stage 3a chronic kidney disease (HCC) 06/26/2019   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   Essential hypertension    Hyperlipidemia    Depression    Esophageal reflux 01/24/2010     Past Medical History:  Diagnosis Date   Acid reflux    Anxiety    Depression    Diabetes mellitus    Dysrhythmia    Gout  Heart murmur    HTN (hypertension)    Hyperlipidemia    Sleep apnea      Past Surgical History:  Procedure Laterality Date   ABDOMINAL HYSTERECTOMY     BACK SURGERY  02/26/2005   DISCS   BIOPSY  10/08/2018   Procedure: BIOPSY;  Surgeon: Normie Becton., MD;  Location: [facility];  Service:  Gastroenterology;;   BREAST EXCISIONAL BIOPSY Left    COLONOSCOPY WITH PROPOFOL  N/A 12/19/2017   Procedure: COLONOSCOPY WITH PROPOFOL ;  Surgeon: Luke Salaam, MD;  Location: [facility];  Service: Gastroenterology;  Laterality: N/A;   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 52   BILATERAL   ENDOSCOPIC MUCOSAL RESECTION N/A 10/08/2018   Procedure: ENDOSCOPIC MUCOSAL RESECTION;  Surgeon: Brice Campi Albino Alu., MD;  Location: [facility] ENDOSCOPY;  Service: Gastroenterology;  Laterality: N/A;   ESOPHAGOGASTRODUODENOSCOPY (EGD) WITH PROPOFOL  N/A 08/28/2018   Procedure: ESOPHAGOGASTRODUODENOSCOPY (EGD) WITH PROPOFOL ;  Surgeon: Luke Salaam, MD;  Location[facility]COPY;  Service: Gastroenterology;  Laterality: N/A;   ESOPHAGOGASTRODUODENOSCOPY (EGD) WITH PROPOFOL  N/A 10/08/2018   Procedure: ESOPHAGOGASTRODUODENOSCOPY (EGD) WITH PROPOFOL ;  Surgeon: Brice Campi Albino Alu., MD;  Location: [facility] ENDOSCOPY;  Service: Gastroenterology;  Laterality: N/A;   EUS N/A 10/08/2018   Procedure: UPPER ENDOSCOPIC ULTRASOUND (EUS) RADIAL;  Surgeon: Normie Becton., MD;  Location: [facility] ENDOS[facility];  Service: Gastroenterology;  Laterality: N/A;   PARTIAL HYSTERECTOMY  02/27/1984   POLYPECTOMY  10/08/2018   Procedure: POLYPECTOMY;  Surgeon: Mansouraty, Albino Alu., MD;  Location: [facility];  Service: Gastroenterology;;    Social History   Socioeconomic History   Marital status: Single    Spouse name: Not on file   Number of children: 2   Years of education: Not on file   Highest education level: Not on file  Occupational History   Not on file  Tobacco Use   Smoking status: Never   Smokeless tobacco: Never  Vaping Use   Vaping status: Never Used  Substance and Sexual Activity   Alcohol use: No   Drug use: No   Sexual activity: Not Currently    Birth control/protection: Surgical    Comment: HYST  Other Topics Concern   Not on file  Social History Narrative   Not on file   Social Drivers of Health    Financial Resource Strain: Not on file  Food Insecurity: No Food Insecurity (07/26/2023)   Hunger Vital Sign    Worried About Running Out of Food in the Last Year: Never true    Ran Out of Food in the Last Year: Never true  Transportation Needs: No Transportation Needs (07/26/2023)   PRAPARE - Administrator, Civil Service (Medical): No    Lack of Transportation (Non-Medical): No  Physical Activity: Not on file  Stress: Not on file  Social Connections: Unknown (07/26/2023)   Social Connection and Isolation Panel [NHANES]    Frequency of Communication with Friends and Family: Never    Frequency of Social Gatherings with Friends and Family: Never    Attends Religious Services: Never    Database administrator or Organizations: No    Attends Banker Meetings: Never    Marital Status: Patient declined  Catering manager Violence: Not At Risk (07/26/2023)   Humiliation, Afraid, Rape, and Kick questionnaire    Fear of Current or Ex-Partner: No    Emotionally Abused: No    Physically Abused: No    Sexually Abused: No     Family History  Problem Relation Age of  Onset   Breast cancer Mother    Heart failure Father    Other Sister        DEGEN.DISC DISEASE   Heart attack Sister    Heart attack Brother    Breast cancer Maternal Grandmother    Kidney cancer Neg Hx    Kidney disease Neg Hx    Prostate cancer Neg Hx      Current Outpatient Medications:    allopurinol  (ZYLOPRIM ) 100 MG tablet, Take 100 mg by mouth daily with lunch. , Disp: , Rfl:    ascorbic acid  (VITAMIN C ) 1000 MG tablet, Take 1,000 mg by mouth 3 (three) times daily., Disp: , Rfl:    aspirin  81 MG EC tablet, Take 81 mg by mouth daily., Disp: , Rfl:    atorvastatin  (LIPITOR) 80 MG tablet, Take 80 mg by mouth daily., Disp: , Rfl:    atropine  1 % ophthalmic solution, Place 1 drop into the right eye 2 (two) times daily., Disp: , Rfl:    Biotin 5000 MCG TABS, Take 5,000 mcg by mouth daily.  HAIR/SKIN/NAILS, Disp: , Rfl:    carvedilol  (COREG ) 12.5 MG tablet, Take 12.5 mg by mouth 2 (two) times daily., Disp: , Rfl:    cetirizine (ZYRTEC) 10 MG tablet, Take 10 mg by mouth daily., Disp: , Rfl:    Cholecalciferol  (VITAMIN D -3) 125 MCG (5000 UT) TABS, Take 5,000 Units by mouth daily., Disp: , Rfl:    cyanocobalamin  (VITAMIN B12) 1000 MCG tablet, Take 1,000 mcg by mouth., Disp: , Rfl:    cyclobenzaprine (FLEXERIL) 10 MG tablet, Take 10 mg by mouth., Disp: , Rfl:    donepezil (ARICEPT) 10 MG tablet, Take 10 mg by mouth at bedtime., Disp: , Rfl:    empagliflozin (JARDIANCE) 10 MG TABS tablet, Take 10 mg by mouth daily., Disp: , Rfl:    EPINEPHrine 0.3 mg/0.3 mL IJ SOAJ injection, once as needed, Disp: , Rfl:    ferrous sulfate  325 (65 FE) MG tablet, Take 325 mg by mouth daily., Disp: , Rfl:    FLUoxetine  (PROZAC ) 40 MG capsule, Take 40 mg by mouth daily., Disp: , Rfl:    Fluticasone -Umeclidin-Vilant (TRELEGY ELLIPTA) 200-62.5-25 MCG/ACT AEPB, Inhale 1 puff into the lungs., Disp: , Rfl:    glipiZIDE  (GLUCOTROL  XL) 5 MG 24 hr tablet, Take 1 tablet (5 mg total) by mouth daily., Disp: 30 tablet, Rfl: 0   hydrochlorothiazide  (HYDRODIURIL ) 25 MG tablet, Take 1 tablet (25 mg total) by mouth daily as needed (lower extremity edema)., Disp: , Rfl:    HYDROcodone -acetaminophen  (NORCO) 10-325 MG tablet, Take 1 tablet by mouth every 8 (eight) hours as needed., Disp: , Rfl:    levothyroxine  (SYNTHROID ) 25 MCG tablet, Take 25 mcg by mouth daily., Disp: , Rfl:    losartan  (COZAAR ) 100 MG tablet, Take 0.5 tablets (50 mg total) by mouth daily. Dose decreased from 100 mg to 50 mg po daily. Monitor BP and increase dose when AKI resolve, Disp: , Rfl:    metFORMIN (GLUCOPHAGE) 1000 MG tablet, Take 1,000 mg by mouth 2 (two) times daily., Disp: , Rfl:    metoCLOPramide  (REGLAN ) 10 MG tablet, Take 1 tablet (10 mg total) by mouth every 8 (eight) hours as needed for up to 14 days for nausea., Disp: 42 tablet, Rfl: 0    mirabegron  ER (MYRBETRIQ ) 50 MG TB24 tablet, Take 1 tablet (50 mg total) by mouth daily., Disp: 90 tablet, Rfl: 3   mirtazapine  (REMERON ) 15 MG tablet, Take 15 mg by  mouth at bedtime., Disp: , Rfl:    omeprazole  (PRILOSEC) 20 MG capsule, Take 20 mg by mouth daily as needed., Disp: , Rfl:    polyethylene glycol (MIRALAX  / GLYCOLAX ) 17 g packet, Take 17 g by mouth., Disp: , Rfl:    prednisoLONE  acetate (PRED FORTE ) 1 % ophthalmic suspension, Place 1 drop into the right eye 2 (two) times daily., Disp: , Rfl:    pregabalin  (LYRICA ) 75 MG capsule, Take 1 capsule (75 mg total) by mouth 2 (two) times daily., Disp: , Rfl:    PRESCRIPTION MEDICATION, Place 1 drop into the left eye 6 (six) times daily., Disp: , Rfl:    PRESCRIPTION MEDICATION, Place 1 drop into the left eye 6 (six) times daily., Disp: , Rfl:    PRESCRIPTION MEDICATION, Place 1 drop into the left eye 6 (six) times daily., Disp: , Rfl:    PRESCRIPTION MEDICATION, Place 1 drop into the left eye 6 (six) times daily., Disp: , Rfl:    Probiotic Product (PROBIOTIC PO), Take 1 tablet by mouth daily., Disp: , Rfl:    vitamin E 45 MG (100 UNITS) capsule, Take by mouth., Disp: , Rfl:    ADVAIR HFA 115-21 MCG/ACT inhaler, Inhale 2 puffs into the lungs 2 (two) times daily. (Patient not taking: Reported on 04/11/2023), Disp: , Rfl:    albuterol  (PROVENTIL ) (2.5 MG/3ML) 0.083% nebulizer solution, Take 3 mLs (2.5 mg total) by nebulization every 4 (four) hours as needed for wheezing or shortness of breath (please include nebulizer machine, hoses, and mask if needed.). (Patient not taking: Reported on 07/26/2023), Disp: 60 mL, Rfl: 0   albuterol  (VENTOLIN  HFA) 108 (90 Base) MCG/ACT inhaler, Inhale 2 puffs into the lungs every 4 (four) hours as needed for wheezing or shortness of breath. (Patient not taking: Reported on 07/26/2023), Disp: 18 g, Rfl: 0   donepezil (ARICEPT) 5 MG tablet, Take 5 mg by mouth at bedtime. (Patient not taking: Reported on 07/26/2023), Disp:  , Rfl:    fluconazole  (DIFLUCAN ) 200 MG tablet, Take 200 mg by mouth daily. (Patient not taking: Reported on 07/26/2023), Disp: , Rfl:    LORazepam  (ATIVAN ) 1 MG tablet, Take 1 tablet (1 mg total) by mouth 2 (two) times daily as needed for anxiety. (Patient not taking: Reported on 07/26/2023), Disp: 30 tablet, Rfl: 0   Miltefosine 50 MG CAPS, Take 50 mg by mouth in the morning, at noon, and at bedtime. (Patient not taking: Reported on 07/26/2023), Disp: , Rfl:    omeprazole  (PRILOSEC OTC) 20 MG tablet, Take 20 mg by mouth. (Patient not taking: Reported on 07/26/2023), Disp: , Rfl:    predniSONE  (DELTASONE ) 20 MG tablet, Take 20 mg by mouth daily with breakfast. (Patient not taking: Reported on 07/26/2023), Disp: , Rfl:    Physical exam:  Vitals:   07/26/23 1354  Pulse: 100  Resp: 19  Temp: 98.8 F (37.1 C)  TempSrc: Tympanic  SpO2: 97%  Weight: 181 lb (82.1 kg)  Height: 5' (1.524 m)   Physical Exam Constitutional:      Comments: Sitting in a wheelchair.  Appears in no acute distress  Cardiovascular:     Rate and Rhythm: Normal rate and regular rhythm.     Heart sounds: Normal heart sounds.  Pulmonary:     Effort: Pulmonary effort is normal.     Breath sounds: Normal breath sounds.  Abdominal:     General: Bowel sounds are normal.     Palpations: Abdomen is soft.  Musculoskeletal:  Right lower leg: No edema.     Left lower leg: No edema.  Skin:    General: Skin is warm and dry.  Neurological:     Mental Status: She is alert and oriented to person, place, and time.           Latest Ref Rng & Units 04/15/2023    6:57 AM  CMP  Glucose 70 - 99 mg/dL 409   BUN 8 - 23 mg/dL 31   Creatinine 8.11 - 1.00 mg/dL 9.14   Sodium 782 - 956 mmol/L 143   Potassium 3.5 - 5.1 mmol/L 4.3   Chloride 98 - 111 mmol/L 113   CO2 22 - 32 mmol/L 22   Calcium  8.9 - 10.3 mg/dL 8.7       Latest Ref Rng & Units 04/15/2023    6:57 AM  CBC  WBC 4.0 - 10.5 K/uL 6.7   Hemoglobin 12.0 - 15.0  g/dL 21.3   Hematocrit 08.6 - 46.0 % 31.5   Platelets 150 - 400 K/uL 317      Assessment and plan- Patient is a 78 y.o. female referred for normocytic anemia  Normocytic anemia and thrombocytosis likely secondary to acute events since September 2025 involving her eye from chronic inflammation.  I am getting a complete anemia workup at this time including ferritin and iron studies B12 folate TSH reticulocyte count LDH serum free light chains and myeloma level.  Her hemoglobin is presently 10.8.She had CMP checked yesterday as well which does show a mild element of CKD with a creatinine of 1.6 which may be also contributing to her anemia.  I will see her back in 3 weeks time to discuss the results of her blood work.  From hematology standpoint she can proceed with her cataract surgery.  Thrombocytosis likely reactive and not related to myeloproliferative neoplasm   Thank you for this kind referral and the opportunity to participate in the care of this patient   Visit Diagnosis 1. Normocytic anemia   2. Thrombocytosis     Dr. Seretha Dance, MD, MPH [facility] [phone] 07/26/2023

## 2023-07-28 LAB — HAPTOGLOBIN: Haptoglobin: 563 mg/dL — ABNORMAL HIGH (ref 42–346)

## 2023-07-29 LAB — MULTIPLE MYELOMA PANEL, SERUM
Albumin SerPl Elph-Mcnc: 3.3 g/dL (ref 2.9–4.4)
Albumin/Glob SerPl: 0.9 (ref 0.7–1.7)
Alpha 1: 0.3 g/dL (ref 0.0–0.4)
Alpha2 Glob SerPl Elph-Mcnc: 1.4 g/dL — ABNORMAL HIGH (ref 0.4–1.0)
B-Globulin SerPl Elph-Mcnc: 1.1 g/dL (ref 0.7–1.3)
Gamma Glob SerPl Elph-Mcnc: 1.1 g/dL (ref 0.4–1.8)
Globulin, Total: 3.8 g/dL (ref 2.2–3.9)
IgA: 278 mg/dL (ref 64–422)
IgG (Immunoglobin G), Serum: 1350 mg/dL (ref 586–1602)
IgM (Immunoglobulin M), Srm: 15 mg/dL — ABNORMAL LOW (ref 26–217)
Total Protein ELP: 7.1 g/dL (ref 6.0–8.5)

## 2023-08-19 ENCOUNTER — Ambulatory Visit: Admitting: Oncology

## 2023-08-26 ENCOUNTER — Inpatient Hospital Stay: Attending: Oncology | Admitting: Oncology

## 2023-08-26 ENCOUNTER — Encounter: Payer: Self-pay | Admitting: Oncology

## 2023-08-26 ENCOUNTER — Inpatient Hospital Stay

## 2023-08-26 VITALS — BP 131/67 | HR 93 | Temp 97.8°F | Resp 16 | Wt 183.0 lb

## 2023-08-26 DIAGNOSIS — N183 Chronic kidney disease, stage 3 unspecified: Secondary | ICD-10-CM | POA: Insufficient documentation

## 2023-08-26 DIAGNOSIS — E538 Deficiency of other specified B group vitamins: Secondary | ICD-10-CM

## 2023-08-26 DIAGNOSIS — D631 Anemia in chronic kidney disease: Secondary | ICD-10-CM | POA: Insufficient documentation

## 2023-08-26 DIAGNOSIS — D649 Anemia, unspecified: Secondary | ICD-10-CM | POA: Diagnosis not present

## 2023-08-26 DIAGNOSIS — D509 Iron deficiency anemia, unspecified: Secondary | ICD-10-CM | POA: Diagnosis not present

## 2023-08-26 MED ORDER — CYANOCOBALAMIN 1000 MCG/ML IJ SOLN: 1000.0000 ug | INTRAMUSCULAR | 1 refills | Status: DC

## 2023-08-26 MED ORDER — BD SAFETYGLIDE SYRINGE/NEEDLE 25G X 1" 3 ML MISC: 1 refills | Status: DC

## 2023-08-26 MED ORDER — CYANOCOBALAMIN 1000 MCG/ML IJ SOLN
1000.0000 ug | Freq: Once | INTRAMUSCULAR | Status: AC
  Administered 2023-08-26: 1000 ug via INTRAMUSCULAR
  Filled 2023-08-26: qty 1

## 2023-08-26 NOTE — Progress Notes (Signed)
 Hematology/Oncology Consult note [facility]  Telephone:([phone] [phone]  Patient Care Team: Weyman Bright, MD as PCP - General (Internal Medicine) Melanee Annah BROCKS, MD as Consulting Physician (Hematology and Oncology)   Name of the patient: Madeline Martinez  [id number]  1945-12-21   Date of visit: 08/26/23  Diagnosis-normocytic anemia likely combination of iron and B12 deficiency  Chief complaint/ Reason for visit-discuss results of blood work  Heme/Onc history: patient is a 78 year old female With a past medical history significant for hypertension hyperlipidemia type 2 diabetes obstructive sleep apnea hypothyroidism depression and GERD among other medical problems.  She has been referred for anemia and thrombocytopenia.  Labs from 06/28/2023 showed serum creatinine of 1.7.  Total protein normal at 6.6.  LFTs normal.  White count was normal at 8.6, H&H of 10.1/32.2 with an MCV of 95.3.  Platelets mildly elevated at 532.  B12 levels normal at 302.  TSH normal at 1.83.  A1c elevated at 7.5.  Looking back at her prior CBCs her hemoglobin was around 12 up until early February 2025 and following that it drifted down to the tens.  Around the same time her creatinine has been between 1.5-1.7.   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  She is scheduled to undergo cataract surgery now that her infection has healed in a couple of weeks time   Results of blood work from 07/26/2023 were as follows: Ferritin levels normal at 82.  Iron saturation 13%.  B12 levels low at 248.  Reticulocyte count low for the degree of anemia at 1.1.  Myeloma panel did not show any evidence of M protein.  Haptoglobin elevated at 563 LDH low at 96.  Interval history-she reports ongoing fatigue.  No new issues as compared to last visit.  ECOG PS- 2 Pain scale-  0   Review of systems- Review of Systems  Constitutional:  Negative for chills, fever, malaise/fatigue and weight loss.  HENT:  Negative for congestion, ear discharge and nosebleeds.   Eyes:  Negative for blurred vision.  Respiratory:  Negative for cough, hemoptysis, sputum production, shortness of breath and wheezing.   Cardiovascular:  Negative for chest pain, palpitations, orthopnea and claudication.  Gastrointestinal:  Negative for abdominal pain, blood in stool, constipation, diarrhea, heartburn, melena, nausea and vomiting.  Genitourinary:  Negative for dysuria, flank pain, frequency, hematuria and urgency.  Musculoskeletal:  Negative for back pain, joint pain and myalgias.  Skin:  Negative for rash.  Neurological:  Negative for dizziness, tingling, focal weakness, seizures, weakness and headaches.  Endo/Heme/Allergies:  Does not bruise/bleed easily.  Psychiatric/Behavioral:  Negative for depression and suicidal ideas. The patient does not have insomnia.       Allergies  Allergen Reactions   Other Other (See Comments)    Seasonal allergy     Past Medical History:  Diagnosis Date   Acid reflux    Anxiety    Depression    Diabetes mellitus    Dysrhythmia    Gout    Heart murmur    HTN (hypertension)    Hyperlipidemia    Sleep apnea      Past Surgical History:  Procedure Laterality Date   ABDOMINAL HYSTERECTOMY     BACK SURGERY  02/26/2005   DISCS   BIOPSY  10/08/2018   Procedure: BIOPSY;  Surgeon: Wilhelmenia Aloha Raddle., MD;  Location:  [facility];  Service: Gastroenterology;;   BREAST EXCISIONAL BIOPSY Left    COLONOSCOPY WITH PROPOFOL  N/A 12/19/2017   Procedure: COLONOSCOPY WITH PROPOFOL ;  Surgeon: Therisa Bi, MD;  Location: [facility] ENDOSCOPY;  Service: Gastroenterology;  Laterality: N/A;   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 58   BILATERAL   ENDOSCOPIC MUCOSAL RESECTION N/A 10/08/2018   Procedure: ENDOSCOPIC MUCOSAL RESECTION;  Surgeon: Wilhelmenia Aloha Raddle.,  MD;  Location: [facility];  Service: Gastroenterology;  Laterality: N/A;   ESOPHAGOGASTRODUODENOSCOPY (EGD) WITH PROPOFOL  N/A 08/28/2018   Procedure: ESOPHAGOGASTRODUODENOSCOPY (EGD) WITH PROPOFOL ;  Surgeon: Therisa Bi, MD;  Location: [facility] ENDOSCOPY;  Service: Gastroenterology;  Laterality: N/A;   ESOPHAGOGASTRODUODENOSCOPY (EGD) WITH PROPOFOL  N/A 10/08/2018   Procedure: ESOPHAGOGASTRODUODENOSCOPY (EGD) WITH PROPOFOL ;  Surgeon: Wilhelmenia Aloha Raddle., MD;  Location: [facility]OS[facility];  Service: Gastroenterology;  Laterality: N/A;   EUS N/A 10/08/2018   Procedure: UPPER ENDOSCOPIC ULTRASOUND (EUS) RADIAL;  Surgeon: Wilhelmenia Aloha Raddle., MD;  Location: [facility];  Service: Gastroenterology;  Laterality: N/A;   PARTIAL HYSTERECTOMY  02/27/1984   POLYPECTOMY  10/08/2018   Procedure: POLYPECTOMY;  Surgeon: Mansouraty, Aloha Raddle., MD;  Location: [facility];  Service: Gastroenterology;;    Social History   Socioeconomic History   Marital status: Single    Spouse name: Not on file   Number of children: 2   Years of education: Not on file   Highest education level: Not on file  Occupational History   Not on file  Tobacco Use   Smoking status: Never   Smokeless tobacco: Never  Vaping Use   Vaping status: Never Used  Substance and Sexual Activity   Alcohol use: No   Drug use: No   Sexual activity: Not Currently    Birth control/protection: Surgical    Comment: HYST  Other Topics Concern   Not on file  Social History Narrative   Not on file   Social Drivers of Health   Financial Resource Strain: Low Risk  (07/30/2023)   Received from [facility]   Overall Financial Resource Strain (CARDIA)    Difficulty of Paying Living Expenses: Not hard at all  Food Insecurity: No Food Insecurity (07/30/2023)   Received from [facility]   Hunger Vital Sign    Within the past 12 months, you worried that your food would run out before you got the money to buy more.: Never true    Within  the past 12 months, the food you bought just didn't last and you didn't have money to get more.: Never true  Transportation Needs: No Transportation Needs (07/30/2023)   Received from [facility] - Transportation    Lack of Transportation (Medical): No    Lack of Transportation (Non-Medical): No  Physical Activity: Not on file  Stress: Not on file  Social Connections: Unknown (07/26/2023)   Social Connection and Isolation Panel    Frequency of Communication with Friends and Family: Never    Frequency of Social Gatherings with Friends and Family: Never    Attends Religious Services: Never    Database administrator or Organizations: No    Attends Banker Meetings: Never    Marital Status: Patient declined  Catering manager Violence: Not At Risk (07/26/2023)   Humiliation, Afraid, Rape, and Kick questionnaire    Fear of Current or Ex-Partner: No    Emotionally Abused: No    Physically Abused: No    Sexually Abused: No    Family History  Problem Relation Age of Onset  Breast cancer Mother    Heart failure Father    Other Sister        DEGEN.DISC DISEASE   Heart attack Sister    Heart attack Brother    Breast cancer Maternal Grandmother    Kidney cancer Neg Hx    Kidney disease Neg Hx    Prostate cancer Neg Hx      Current Outpatient Medications:    ADVAIR HFA 115-21 MCG/ACT inhaler, Inhale 2 puffs into the lungs 2 (two) times daily. (Patient not taking: Reported on 04/11/2023), Disp: , Rfl:    albuterol  (PROVENTIL ) (2.5 MG/3ML) 0.083% nebulizer solution, Take 3 mLs (2.5 mg total) by nebulization every 4 (four) hours as needed for wheezing or shortness of breath (please include nebulizer machine, hoses, and mask if needed.). (Patient not taking: Reported on 07/26/2023), Disp: 60 mL, Rfl: 0   albuterol  (VENTOLIN  HFA) 108 (90 Base) MCG/ACT inhaler, Inhale 2 puffs into the lungs every 4 (four) hours as needed for wheezing or shortness of breath. (Patient not  taking: Reported on 07/26/2023), Disp: 18 g, Rfl: 0   allopurinol  (ZYLOPRIM ) 100 MG tablet, Take 100 mg by mouth daily with lunch. , Disp: , Rfl:    ascorbic acid  (VITAMIN C ) 1000 MG tablet, Take 1,000 mg by mouth 3 (three) times daily., Disp: , Rfl:    aspirin  81 MG EC tablet, Take 81 mg by mouth daily., Disp: , Rfl:    atorvastatin  (LIPITOR) 80 MG tablet, Take 80 mg by mouth daily., Disp: , Rfl:    atropine  1 % ophthalmic solution, Place 1 drop into the right eye 2 (two) times daily., Disp: , Rfl:    Biotin 5000 MCG TABS, Take 5,000 mcg by mouth daily. HAIR/SKIN/NAILS, Disp: , Rfl:    carvedilol  (COREG ) 12.5 MG tablet, Take 12.5 mg by mouth 2 (two) times daily., Disp: , Rfl:    cetirizine (ZYRTEC) 10 MG tablet, Take 10 mg by mouth daily., Disp: , Rfl:    Cholecalciferol  (VITAMIN D -3) 125 MCG (5000 UT) TABS, Take 5,000 Units by mouth daily., Disp: , Rfl:    cyanocobalamin  (VITAMIN B12) 1000 MCG tablet, Take 1,000 mcg by mouth., Disp: , Rfl:    cyclobenzaprine (FLEXERIL) 10 MG tablet, Take 10 mg by mouth., Disp: , Rfl:    donepezil (ARICEPT) 10 MG tablet, Take 10 mg by mouth at bedtime., Disp: , Rfl:    donepezil (ARICEPT) 5 MG tablet, Take 5 mg by mouth at bedtime. (Patient not taking: Reported on 07/26/2023), Disp: , Rfl:    empagliflozin (JARDIANCE) 10 MG TABS tablet, Take 10 mg by mouth daily., Disp: , Rfl:    EPINEPHrine 0.3 mg/0.3 mL IJ SOAJ injection, once as needed, Disp: , Rfl:    ferrous sulfate  325 (65 FE) MG tablet, Take 325 mg by mouth daily., Disp: , Rfl:    fluconazole  (DIFLUCAN ) 200 MG tablet, Take 200 mg by mouth daily. (Patient not taking: Reported on 07/26/2023), Disp: , Rfl:    FLUoxetine  (PROZAC ) 40 MG capsule, Take 40 mg by mouth daily., Disp: , Rfl:    Fluticasone -Umeclidin-Vilant (TRELEGY ELLIPTA) 200-62.5-25 MCG/ACT AEPB, Inhale 1 puff into the lungs., Disp: , Rfl:    glipiZIDE  (GLUCOTROL  XL) 5 MG 24 hr tablet, Take 1 tablet (5 mg total) by mouth daily., Disp: 30 tablet,  Rfl: 0   hydrochlorothiazide  (HYDRODIURIL ) 25 MG tablet, Take 1 tablet (25 mg total) by mouth daily as needed (lower extremity edema)., Disp: , Rfl:    HYDROcodone -acetaminophen  (NORCO) 10-325  MG tablet, Take 1 tablet by mouth every 8 (eight) hours as needed., Disp: , Rfl:    levothyroxine  (SYNTHROID ) 25 MCG tablet, Take 25 mcg by mouth daily., Disp: , Rfl:    LORazepam  (ATIVAN ) 1 MG tablet, Take 1 tablet (1 mg total) by mouth 2 (two) times daily as needed for anxiety. (Patient not taking: Reported on 07/26/2023), Disp: 30 tablet, Rfl: 0   losartan  (COZAAR ) 100 MG tablet, Take 0.5 tablets (50 mg total) by mouth daily. Dose decreased from 100 mg to 50 mg po daily. Monitor BP and increase dose when AKI resolve, Disp: , Rfl:    metFORMIN (GLUCOPHAGE) 1000 MG tablet, Take 1,000 mg by mouth 2 (two) times daily., Disp: , Rfl:    metoCLOPramide  (REGLAN ) 10 MG tablet, Take 1 tablet (10 mg total) by mouth every 8 (eight) hours as needed for up to 14 days for nausea., Disp: 42 tablet, Rfl: 0   Miltefosine 50 MG CAPS, Take 50 mg by mouth in the morning, at noon, and at bedtime. (Patient not taking: Reported on 07/26/2023), Disp: , Rfl:    mirabegron  ER (MYRBETRIQ ) 50 MG TB24 tablet, Take 1 tablet (50 mg total) by mouth daily., Disp: 90 tablet, Rfl: 3   mirtazapine  (REMERON ) 15 MG tablet, Take 15 mg by mouth at bedtime., Disp: , Rfl:    omeprazole  (PRILOSEC OTC) 20 MG tablet, Take 20 mg by mouth. (Patient not taking: Reported on 07/26/2023), Disp: , Rfl:    omeprazole  (PRILOSEC) 20 MG capsule, Take 20 mg by mouth daily as needed., Disp: , Rfl:    polyethylene glycol (MIRALAX  / GLYCOLAX ) 17 g packet, Take 17 g by mouth., Disp: , Rfl:    prednisoLONE  acetate (PRED FORTE ) 1 % ophthalmic suspension, Place 1 drop into the right eye 2 (two) times daily., Disp: , Rfl:    predniSONE  (DELTASONE ) 20 MG tablet, Take 20 mg by mouth daily with breakfast. (Patient not taking: Reported on 07/26/2023), Disp: , Rfl:    pregabalin   (LYRICA ) 75 MG capsule, Take 1 capsule (75 mg total) by mouth 2 (two) times daily., Disp: , Rfl:    PRESCRIPTION MEDICATION, Place 1 drop into the left eye 6 (six) times daily., Disp: , Rfl:    PRESCRIPTION MEDICATION, Place 1 drop into the left eye 6 (six) times daily., Disp: , Rfl:    PRESCRIPTION MEDICATION, Place 1 drop into the left eye 6 (six) times daily., Disp: , Rfl:    PRESCRIPTION MEDICATION, Place 1 drop into the left eye 6 (six) times daily., Disp: , Rfl:    Probiotic Product (PROBIOTIC PO), Take 1 tablet by mouth daily., Disp: , Rfl:    vitamin E 45 MG (100 UNITS) capsule, Take by mouth., Disp: , Rfl:   Physical exam: There were no vitals filed for this visit. Physical Exam   I have personally reviewed labs listed below:    Latest Ref Rng & Units 04/15/2023    6:57 AM  CMP  Glucose 70 - 99 mg/dL 863   BUN 8 - 23 mg/dL 31   Creatinine 9.55 - 1.00 mg/dL 8.50   Sodium 864 - 854 mmol/L 143   Potassium 3.5 - 5.1 mmol/L 4.3   Chloride 98 - 111 mmol/L 113   CO2 22 - 32 mmol/L 22   Calcium  8.9 - 10.3 mg/dL 8.7       Latest Ref Rng & Units 04/15/2023    6:57 AM  CBC  WBC 4.0 - 10.5 K/uL 6.7  Hemoglobin 12.0 - 15.0 g/dL 89.4   Hematocrit 63.9 - 46.0 % 31.5   Platelets 150 - 400 K/uL 317      Assessment and plan- Patient is a 78 y.o. female referred for normocytic anemia  Patient has an element of CKD given that her creatinine is elevated at 1.6.  Ferritin levels are less than 182 with an iron saturation of 13%.  It would be reasonable to offer her IV iron and see if she would have an improvement in her hemoglobin.  B12 levels were also low at 248 and patient has received B12 injections in the past.  We are proceeding with 5 doses of Venofer at this time.  Discussed risks and benefits of IV iron including all but not limited to possible risk of infusion anaphylactic reaction.  Patient understands and agrees to proceed as planned.  We will plan to give her 1 dose of B12  injection today and the rest of the injections she will self administer with the help of family on a monthly basis.  CBC ferritin and iron studies in 3 and 6 months and I will see her back in 6 months.  B12 levels to be repeated in 6 months   Visit Diagnosis 1. B12 deficiency   2. Normocytic anemia   3. Iron deficiency anemia, unspecified iron deficiency anemia type   4. Anemia of chronic kidney failure, stage 3 (moderate) (HCC)      Dr. Annah Skene, MD, MPH [facility] [phone] 08/26/2023 3:55 PM

## 2023-08-28 ENCOUNTER — Inpatient Hospital Stay: Attending: Oncology

## 2023-08-28 VITALS — BP 117/60 | HR 87 | Temp 98.3°F | Resp 18

## 2023-08-28 DIAGNOSIS — D509 Iron deficiency anemia, unspecified: Secondary | ICD-10-CM | POA: Insufficient documentation

## 2023-08-28 DIAGNOSIS — E538 Deficiency of other specified B group vitamins: Secondary | ICD-10-CM

## 2023-08-28 MED ORDER — IRON SUCROSE 20 MG/ML IV SOLN
200.0000 mg | INTRAVENOUS | Status: AC
  Administered 2023-08-28: 200 mg via INTRAVENOUS

## 2023-08-28 NOTE — Patient Instructions (Signed)

## 2023-09-04 ENCOUNTER — Inpatient Hospital Stay

## 2023-09-11 ENCOUNTER — Inpatient Hospital Stay

## 2023-09-11 ENCOUNTER — Encounter: Payer: Self-pay | Admitting: Internal Medicine

## 2023-09-11 VITALS — BP 127/68 | HR 97 | Temp 97.0°F | Resp 18

## 2023-09-11 DIAGNOSIS — D509 Iron deficiency anemia, unspecified: Secondary | ICD-10-CM | POA: Diagnosis not present

## 2023-09-11 DIAGNOSIS — E538 Deficiency of other specified B group vitamins: Secondary | ICD-10-CM

## 2023-09-11 MED ORDER — IRON SUCROSE 20 MG/ML IV SOLN
200.0000 mg | INTRAVENOUS | Status: DC
  Administered 2023-09-11: 200 mg via INTRAVENOUS
  Filled 2023-09-11: qty 10

## 2023-09-11 MED ORDER — CYANOCOBALAMIN 1000 MCG/ML IJ SOLN: 1000.0000 ug | INTRAMUSCULAR | Status: DC

## 2023-09-11 MED ORDER — SODIUM CHLORIDE 0.9% FLUSH
10.0000 mL | Freq: Once | INTRAVENOUS | Status: AC | PRN
  Administered 2023-09-11: 10 mL
  Filled 2023-09-11: qty 10

## 2023-09-18 ENCOUNTER — Inpatient Hospital Stay

## 2023-09-18 VITALS — BP 121/67 | HR 89 | Temp 96.6°F | Resp 18

## 2023-09-18 DIAGNOSIS — D509 Iron deficiency anemia, unspecified: Secondary | ICD-10-CM | POA: Diagnosis not present

## 2023-09-18 DIAGNOSIS — E538 Deficiency of other specified B group vitamins: Secondary | ICD-10-CM

## 2023-09-18 MED ORDER — IRON SUCROSE 20 MG/ML IV SOLN
200.0000 mg | INTRAVENOUS | Status: DC
  Administered 2023-09-18: 200 mg via INTRAVENOUS
  Filled 2023-09-18: qty 10

## 2023-09-18 MED ORDER — SODIUM CHLORIDE 0.9% FLUSH
10.0000 mL | Freq: Once | INTRAVENOUS | Status: AC | PRN
  Administered 2023-09-18: 10 mL
  Filled 2023-09-18: qty 10

## 2023-09-25 ENCOUNTER — Inpatient Hospital Stay

## 2023-09-25 VITALS — BP 121/71 | HR 85 | Temp 96.5°F | Resp 19

## 2023-09-25 DIAGNOSIS — D509 Iron deficiency anemia, unspecified: Secondary | ICD-10-CM | POA: Diagnosis not present

## 2023-09-25 DIAGNOSIS — E538 Deficiency of other specified B group vitamins: Secondary | ICD-10-CM

## 2023-09-25 MED ORDER — IRON SUCROSE 20 MG/ML IV SOLN
200.0000 mg | INTRAVENOUS | Status: DC
  Administered 2023-09-25: 200 mg via INTRAVENOUS
  Filled 2023-09-25: qty 10

## 2023-10-02 ENCOUNTER — Inpatient Hospital Stay: Attending: Oncology

## 2023-10-02 VITALS — BP 126/74 | HR 83 | Temp 95.0°F | Resp 19

## 2023-10-02 DIAGNOSIS — D509 Iron deficiency anemia, unspecified: Secondary | ICD-10-CM | POA: Insufficient documentation

## 2023-10-02 DIAGNOSIS — E538 Deficiency of other specified B group vitamins: Secondary | ICD-10-CM

## 2023-10-02 MED ORDER — IRON SUCROSE 20 MG/ML IV SOLN
200.0000 mg | INTRAVENOUS | Status: DC
  Administered 2023-10-02: 200 mg via INTRAVENOUS
  Filled 2023-10-02: qty 10

## 2023-10-02 MED ORDER — SODIUM CHLORIDE 0.9% FLUSH
10.0000 mL | Freq: Once | INTRAVENOUS | Status: AC | PRN
Start: 2023-10-02 — End: 2023-10-02
  Administered 2023-10-02: 10 mL
  Filled 2023-10-02: qty 10

## 2023-10-23 ENCOUNTER — Emergency Department
Admission: EM | Admit: 2023-10-23 | Discharge: 2023-10-23 | Disposition: A | Discharge status: Home / Self Care | Attending: Emergency Medicine | Admitting: Emergency Medicine

## 2023-10-23 ENCOUNTER — Emergency Department

## 2023-10-23 ENCOUNTER — Other Ambulatory Visit: Payer: Self-pay

## 2023-10-23 DIAGNOSIS — E1122 Type 2 diabetes mellitus with diabetic chronic kidney disease: Secondary | ICD-10-CM | POA: Diagnosis not present

## 2023-10-23 DIAGNOSIS — M79642 Pain in left hand: Secondary | ICD-10-CM | POA: Insufficient documentation

## 2023-10-23 DIAGNOSIS — W19XXXA Unspecified fall, initial encounter: Secondary | ICD-10-CM

## 2023-10-23 DIAGNOSIS — W01198A Fall on same level from slipping, tripping and stumbling with subsequent striking against other object, initial encounter: Secondary | ICD-10-CM | POA: Insufficient documentation

## 2023-10-23 DIAGNOSIS — M79641 Pain in right hand: Secondary | ICD-10-CM | POA: Insufficient documentation

## 2023-10-23 DIAGNOSIS — I129 Hypertensive chronic kidney disease with stage 1 through stage 4 chronic kidney disease, or unspecified chronic kidney disease: Secondary | ICD-10-CM | POA: Insufficient documentation

## 2023-10-23 DIAGNOSIS — M25521 Pain in right elbow: Secondary | ICD-10-CM | POA: Diagnosis not present

## 2023-10-23 DIAGNOSIS — N189 Chronic kidney disease, unspecified: Secondary | ICD-10-CM | POA: Insufficient documentation

## 2023-10-23 DIAGNOSIS — E119 Type 2 diabetes mellitus without complications: Secondary | ICD-10-CM

## 2023-10-23 DIAGNOSIS — F0390 Unspecified dementia without behavioral disturbance: Secondary | ICD-10-CM | POA: Diagnosis not present

## 2023-10-23 DIAGNOSIS — M546 Pain in thoracic spine: Secondary | ICD-10-CM | POA: Diagnosis present

## 2023-10-23 MED ORDER — NAPROXEN 500 MG PO TABS
500.0000 mg | ORAL_TABLET | Freq: Once | ORAL | Status: AC
  Administered 2023-10-23: 500 mg via ORAL
  Filled 2023-10-23: qty 1

## 2023-10-23 MED ORDER — HYDROCODONE-ACETAMINOPHEN 5-325 MG PO TABS
2.0000 | ORAL_TABLET | Freq: Once | ORAL | Status: AC
  Administered 2023-10-23: 2 via ORAL
  Filled 2023-10-23: qty 2

## 2023-10-23 MED ORDER — PREGABALIN 75 MG PO CAPS
150.0000 mg | ORAL_CAPSULE | ORAL | Status: AC
  Administered 2023-10-23: 150 mg via ORAL
  Filled 2023-10-23: qty 2

## 2023-10-23 MED ORDER — METHOCARBAMOL 500 MG PO TABS: 500.0000 mg | ORAL_TABLET | Freq: Three times a day (TID) | ORAL | 0 refills | Status: AC | PRN

## 2023-10-23 NOTE — ED Triage Notes (Signed)
 Patient wheeled to triage with complaints of fall today. Patient states she tripped when attempting to walk from hardwood to carpet. Patient states she hit her head, complaints of head, jaw, hands, and right arm/elbow pain. Patient denies blood thinners.

## 2023-10-23 NOTE — Discharge Instructions (Addendum)
 Your CT scans and x-rays today were all okay and do not show any fractures.  Continue taking your usual medications, use your walker at all times.  Add on methocarbamol  to help with muscle pain over the next week.

## 2023-10-23 NOTE — ED Provider Notes (Signed)
 [facility] Provider Note    Event Date/Time   First MD Initiated Contact with Patient 10/23/23 2126     (approximate)   History   Chief Complaint: Fall   HPI  Madeline Martinez is a 78 y.o. female with a history of hypertension CKD dementia diabetes who reports a fall at home.  She was walking without her walker, and when she crossed a transition in the floor from hardwood to carpet, she lost her balance and fell forward.  Fell onto bilateral hands and complains of pain in both hands, right elbow, upper back.  She also hit her chin.  Denies loss of consciousness.        Past Medical History:  Diagnosis Date   Acid reflux    Anxiety    Depression    Diabetes mellitus    Dysrhythmia    Gout    Heart murmur    HTN (hypertension)    Hyperlipidemia    Sleep apnea     Current Outpatient Rx   Order #: [id number] Class: Normal   Order #: [id number] Class: Historical Med   Order #: [id number] Class: Normal   Order #: [id number] Class: Normal   Order #: [id number] Class: Historical Med   Order #: [id number] Class: Historical Med   Order #: [id number] Class: Historical Med   Order #: [id number] Class: Historical Med   Order #: [id number] Class: Historical Med   Order #: [id number] Class: Historical Med   Order #: [id number] Class: Historical Med   Order #: [id number] Class: Historical Med   Order #: [id number] Class: Historical Med   Order #: [id number] Class: Historical Med   Order #: [id number] Class: Normal   Order #: [id number] Class: Historical Med   Order #: [id number] Class: Historical Med   Order #: [id number] Class: Historical Med   Order #: [id number] Class: Historical Med   Order #: [id number] Class: Historical Med   Order #: [id number] Class: Historical Med   Order #: [id number] Class: Historical Med   Order #: [id number] Class: Historical Med   Order #: [id number] Class: Historical Med   Order #: [id number] Class: Normal   Order #: [id number] Class: Historical Med   Order #:  [id number] Class: Historical Med   Order #: [id number] Class: Historical Med   Order #: [id number] Class: No Print   Order #: [id number] Class: Historical Med   Order #: [id number] Class: Historical Med   Order #: [id number] Class: Normal   Order #: [id number] Class: Historical Med   Order #: [id number] Class: Normal   Order #: [id number] Class: Historical Med   Order #: [id number] Class: Historical Med   Order #: [id number] Class: Historical Med   Order #: [id number] Class: Historical Med   Order #: [id number] Class: Historical Med   Order #: [id number] Class: Historical Med   Order #: [id number] Class: No Print   Order #: [id number] Class: Historical Med   Order #: [id number] Class: Historical Med   Order #: [id number] Class: Historical Med   Order #: [id number] Class: Historical Med   Order #: [id number] Class: Historical Med   Order #: [id number] Class: Normal   Order #: [id number] Class: Historical Med    Past Surgical History:  Procedure Laterality Date   ABDOMINAL HYSTERECTOMY     BACK SURGERY  02/26/2005   DISCS   BIOPSY  10/08/2018   Procedure: BIOPSY;  Surgeon: Mansouraty, Aloha Raddle., MD;  Location: [facility];  Service: Gastroenterology;;   BREAST EXCISIONAL BIOPSY Left    COLONOSCOPY WITH PROPOFOL  N/A 12/19/2017   Procedure: COLONOSCOPY WITH PROPOFOL ;  Surgeon: Therisa Bi, MD;  Location: [name] ENDOSCOPY;  Service: Gastroenterology;  Laterality: N/A;   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 74   BILATERAL  ENDOSCOPIC MUCOSAL RESECTION N/A 10/08/2018   Procedure: ENDOSCOPIC MUCOSAL RESECTION;  Surgeon: Wilhelmenia Aloha Raddle., MD;  Location: [facility];  Service: Gastroenterology;  Laterality: N/A;   ESOPHAGOGASTRODUODENOSCOPY (EGD) WITH PROPOFOL  N/A 08/28/2018   Procedure: ESOPHAGOGASTRODUODENOSCOPY (EGD) WITH PROPOFOL ;  Surgeon: Therisa Bi, MD;  Location: [facility] ENDOSCOPY;  Service: Gastroenterology;  Laterality: N/A;   ESOPHAGOGASTRODUODENOSCOPY (EGD) WITH PROPOFOL  N/A 10/08/2018   Procedure:  ESOPHAGOGASTRODUODENOSCOPY (EGD) WITH PROPOFOL ;  Surgeon: Wilhelmenia Aloha Raddle., MD;  Location: [facility];  Service: Gastroenterology;  Laterality: N/A;   EUS N/A 10/08/2018   Procedure: UPPER ENDOSCOPIC ULTRASOUND (EUS) RADIAL;  Surgeon: Wilhelmenia Aloha Raddle., MD;  Location: [facility];  Service: Gastroenterology;  Laterality: N/A;   PARTIAL HYSTERECTOMY  02/27/1984   POLYPECTOMY  10/08/2018   Procedure: POLYPECTOMY;  Surgeon: Mansouraty, Aloha Raddle., MD;  Location: [facility];  Service: Gastroenterology;;    Physical Exam   Triage Vital Signs: ED Triage Vitals  Encounter Vitals Group     BP 10/23/23 2049 (!) 145/79     Girls Systolic BP Percentile --      Girls Diastolic BP Percentile --      Boys Systolic BP Percentile --      Boys Diastolic BP Percentile --      Pulse Rate 10/23/23 2049 92     Resp 10/23/23 2049 18     Temp 10/23/23 2049 97.7 F (36.5 C)     Temp Source 10/23/23 2049 Oral     SpO2 10/23/23 2049 98 %     Weight 10/23/23 2046 180 lb (81.6 kg)     Height 10/23/23 2046 5' (1.524 m)     Head Circumference --      Peak Flow --      Pain Score 10/23/23 2046 8     Pain Loc --      Pain Education --      Exclude from Growth Chart --     Most recent vital signs: Vitals:   10/23/23 2200 10/23/23 2230  BP: 131/79 (!) 156/90  Pulse: 90 89  Resp: (!) 21 16  Temp:    SpO2: 100% 98%    General: Awake, no distress.  CV:  Good peripheral perfusion.  Regular rate rhythm Resp:  Normal effort.  Clear to auscultation Abd:  No distention.  Soft nontender Other:  Mild swelling of the right dorsal hand.  No focal bony tenderness or wounds. Mild tenderness of left hand over the metacarpals.  No wounds or deformities. Tenderness at the right elbow epicondyles.  Tenderness at midline thoracic back and right rhomboids. No C-spine tenderness.  Mild abrasion to the tongue and bruising to the chin, head is otherwise atraumatic.   ED Results / Procedures /  Treatments   Labs (all labs ordered are listed, but only abnormal results are displayed) Labs Reviewed - No data to display   EKG    RADIOLOGY X-ray bilateral hands interpreted by me, negative for fracture.  Radiology report reviewed X-ray right elbow negative X-ray thoracic spine negative CT head and cervical spine negative for acute injury   PROCEDURES:  Procedures   MEDICATIONS ORDERED IN ED: Medications  HYDROcodone -acetaminophen  (NORCO/VICODIN) 5-325 MG per tablet 2 tablet (2 tablets Oral Given 10/23/23 2157)  pregabalin  (LYRICA ) capsule 150 mg (150 mg Oral Given 10/23/23 2157)  naproxen  (NAPROSYN ) tablet 500 mg (500 mg Oral Given 10/23/23 2157)     IMPRESSION / MDM / ASSESSMENT AND PLAN / ED COURSE  I reviewed the triage vital signs and  the nursing notes.  DDx: Intracranial hemorrhage, C-spine fracture, T-spine fracture, hand fracture, elbow fracture  Patient's presentation is most consistent with acute presentation with potential threat to life or bodily function.  Patient presents with mechanical fall in the setting of not using her usual assistive device.  Overall nontoxic without obvious deformities or serious injuries.  Will continue her usual chronic pain regimen with Lyrica  and hydrocodone , will add on Robaxin .  Encouraged her to use her walker at all times, follow-up with primary care.       FINAL CLINICAL IMPRESSION(S) / ED DIAGNOSES   Final diagnoses:  Fall in home, initial encounter  Type 2 diabetes mellitus without complication, without long-term current use of insulin  (HCC)     Rx / DC Orders   ED Discharge Orders          Ordered    methocarbamol  (ROBAXIN ) 500 MG tablet  Every 8 hours PRN        10/23/23 2242             Note:  This document was prepared using Dragon voice recognition software and may include unintentional dictation errors.   Viviann Pastor, MD 10/23/23 2251

## 2023-11-25 ENCOUNTER — Inpatient Hospital Stay: Attending: Oncology

## 2023-11-25 DIAGNOSIS — D509 Iron deficiency anemia, unspecified: Secondary | ICD-10-CM | POA: Diagnosis present

## 2023-11-25 DIAGNOSIS — D649 Anemia, unspecified: Secondary | ICD-10-CM

## 2023-11-25 DIAGNOSIS — E538 Deficiency of other specified B group vitamins: Secondary | ICD-10-CM

## 2023-11-25 LAB — CBC (CANCER CENTER ONLY)
HCT: 38.6 % (ref 36.0–46.0)
Hemoglobin: 12.2 g/dL (ref 12.0–15.0)
MCH: 30.2 pg (ref 26.0–34.0)
MCHC: 31.6 g/dL (ref 30.0–36.0)
MCV: 95.5 fL (ref 80.0–100.0)
Platelet Count: 349 K/uL (ref 150–400)
RBC: 4.04 MIL/uL (ref 3.87–5.11)
RDW: 14.9 % (ref 11.5–15.5)
WBC Count: 8.1 K/uL (ref 4.0–10.5)
nRBC: 0 % (ref 0.0–0.2)

## 2023-11-25 LAB — FERRITIN: Ferritin: 165 ng/mL (ref 11–307)

## 2023-11-25 LAB — IRON AND TIBC
Iron: 50 ug/dL (ref 28–170)
Saturation Ratios: 19 % (ref 10.4–31.8)
TIBC: 259 ug/dL (ref 250–450)
UIBC: 209 ug/dL

## 2023-11-25 LAB — VITAMIN B12: Vitamin B-12: 454 pg/mL (ref 180–914)

## 2023-11-28 ENCOUNTER — Ambulatory Visit: Admitting: Internal Medicine

## 2023-12-03 ENCOUNTER — Encounter: Payer: Self-pay | Admitting: Internal Medicine

## 2023-12-03 ENCOUNTER — Ambulatory Visit: Payer: Self-pay | Admitting: Internal Medicine

## 2023-12-03 ENCOUNTER — Ambulatory Visit (INDEPENDENT_AMBULATORY_CARE_PROVIDER_SITE_OTHER): Admitting: Internal Medicine

## 2023-12-03 VITALS — BP 144/70 | HR 101 | Ht 60.0 in | Wt 182.0 lb

## 2023-12-03 DIAGNOSIS — K219 Gastro-esophageal reflux disease without esophagitis: Secondary | ICD-10-CM

## 2023-12-03 DIAGNOSIS — J4530 Mild persistent asthma, uncomplicated: Secondary | ICD-10-CM

## 2023-12-03 DIAGNOSIS — E782 Mixed hyperlipidemia: Secondary | ICD-10-CM

## 2023-12-03 DIAGNOSIS — E538 Deficiency of other specified B group vitamins: Secondary | ICD-10-CM

## 2023-12-03 DIAGNOSIS — E038 Other specified hypothyroidism: Secondary | ICD-10-CM | POA: Diagnosis not present

## 2023-12-03 DIAGNOSIS — I152 Hypertension secondary to endocrine disorders: Secondary | ICD-10-CM | POA: Diagnosis not present

## 2023-12-03 DIAGNOSIS — D649 Anemia, unspecified: Secondary | ICD-10-CM

## 2023-12-03 DIAGNOSIS — Z13820 Encounter for screening for osteoporosis: Secondary | ICD-10-CM | POA: Insufficient documentation

## 2023-12-03 DIAGNOSIS — G4733 Obstructive sleep apnea (adult) (pediatric): Secondary | ICD-10-CM

## 2023-12-03 DIAGNOSIS — F419 Anxiety disorder, unspecified: Secondary | ICD-10-CM

## 2023-12-03 DIAGNOSIS — E1169 Type 2 diabetes mellitus with other specified complication: Secondary | ICD-10-CM

## 2023-12-03 DIAGNOSIS — G4700 Insomnia, unspecified: Secondary | ICD-10-CM | POA: Insufficient documentation

## 2023-12-03 DIAGNOSIS — E1165 Type 2 diabetes mellitus with hyperglycemia: Secondary | ICD-10-CM | POA: Diagnosis not present

## 2023-12-03 DIAGNOSIS — Z947 Corneal transplant status: Secondary | ICD-10-CM

## 2023-12-03 DIAGNOSIS — M1000 Idiopathic gout, unspecified site: Secondary | ICD-10-CM | POA: Insufficient documentation

## 2023-12-03 DIAGNOSIS — F320 Major depressive disorder, single episode, mild: Secondary | ICD-10-CM

## 2023-12-03 DIAGNOSIS — E1159 Type 2 diabetes mellitus with other circulatory complications: Secondary | ICD-10-CM

## 2023-12-03 LAB — POCT CBG (FASTING - GLUCOSE)-MANUAL ENTRY: Glucose Fasting, POC: 136 mg/dL — AB (ref 70–99)

## 2023-12-03 MED ORDER — TRAZODONE HCL 50 MG PO TABS: 25.0000 mg | ORAL_TABLET | Freq: Every day | ORAL | 1 refills | Status: AC
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
## 2023-12-05 ENCOUNTER — Other Ambulatory Visit

## 2023-12-06 LAB — CMP14+EGFR
ALT: 19 IU/L (ref 0–32)
AST: 15 IU/L (ref 0–40)
Albumin: 4.1 g/dL (ref 3.8–4.8)
Alkaline Phosphatase: 83 IU/L (ref 49–135)
BUN/Creatinine Ratio: 11 — ABNORMAL LOW (ref 12–28)
BUN: 15 mg/dL (ref 8–27)
Bilirubin Total: <0.2 mg/dL (ref 0.0–1.2)
CO2: 23 mmol/L (ref 20–29)
Calcium: 9.5 mg/dL (ref 8.7–10.3)
Chloride: 101 mmol/L (ref 96–106)
Creatinine, Ser: 1.31 mg/dL — ABNORMAL HIGH (ref 0.57–1.00)
Globulin, Total: 2.4 g/dL (ref 1.5–4.5)
Glucose: 134 mg/dL — ABNORMAL HIGH (ref 70–99)
Potassium: 5.1 mmol/L (ref 3.5–5.2)
Sodium: 139 mmol/L (ref 134–144)
Total Protein: 6.5 g/dL (ref 6.0–8.5)
eGFR: 42 mL/min/1.73 — ABNORMAL LOW (ref >59)

## 2023-12-06 LAB — CBC WITH DIFFERENTIAL/PLATELET
Basophils Absolute: 0.1 x10E3/uL (ref 0.0–0.2)
Basos: 1 %
EOS (ABSOLUTE): 0.4 x10E3/uL (ref 0.0–0.4)
Eos: 8 %
Hematocrit: 37.3 % (ref 34.0–46.6)
Hemoglobin: 11.9 g/dL (ref 11.1–15.9)
Immature Grans (Abs): 0 x10E3/uL (ref 0.0–0.1)
Immature Granulocytes: 0 %
Lymphocytes Absolute: 2.4 x10E3/uL (ref 0.7–3.1)
Lymphs: 45 %
MCH: 30.2 pg (ref 26.6–33.0)
MCHC: 31.9 g/dL (ref 31.5–35.7)
MCV: 95 fL (ref 79–97)
Monocytes Absolute: 0.4 x10E3/uL (ref 0.1–0.9)
Monocytes: 8 %
Neutrophils Absolute: 2 x10E3/uL (ref 1.4–7.0)
Neutrophils: 38 %
Platelets: 328 x10E3/uL (ref 150–450)
RBC: 3.94 x10E6/uL (ref 3.77–5.28)
RDW: 13.6 % (ref 11.7–15.4)
WBC: 5.4 x10E3/uL (ref 3.4–10.8)

## 2023-12-06 LAB — LIPID PANEL W/O CHOL/HDL RATIO
Cholesterol, Total: 177 mg/dL (ref 100–199)
HDL: 49 mg/dL (ref >39)
LDL Chol Calc (NIH): 87 mg/dL (ref 0–99)
Triglycerides: 244 mg/dL — ABNORMAL HIGH (ref 0–149)
VLDL Cholesterol Cal: 41 mg/dL — ABNORMAL HIGH (ref 5–40)

## 2023-12-06 LAB — MICROALBUMIN / CREATININE URINE RATIO
Creatinine, Urine: 142.1 mg/dL
Microalb/Creat Ratio: 12 mg/g{creat} (ref 0–29)
Microalbumin, Urine: 16.8 ug/mL

## 2023-12-06 LAB — HEMOGLOBIN A1C
Est. average glucose Bld gHb Est-mCnc: 163 mg/dL
Hgb A1c MFr Bld: 7.3 % — ABNORMAL HIGH (ref 4.8–5.6)

## 2023-12-06 LAB — TSH+T4F+T3FREE
Free T4: 0.76 ng/dL — ABNORMAL LOW (ref 0.82–1.77)
T3, Free: 2.3 pg/mL (ref 2.0–4.4)
TSH: 2.61 u[IU]/mL (ref 0.450–4.500)

## 2023-12-06 LAB — URIC ACID: Uric Acid: 5.7 mg/dL (ref 3.1–7.9)

## 2023-12-06 LAB — VITAMIN B12: Vitamin B-12: 1043 pg/mL (ref 232–1245)

## 2023-12-17 ENCOUNTER — Ambulatory Visit: Admitting: Internal Medicine

## 2023-12-17 ENCOUNTER — Institutional Professional Consult (permissible substitution): Admitting: Cardiovascular Disease

## 2023-12-17 ENCOUNTER — Encounter: Payer: Self-pay | Admitting: Internal Medicine

## 2023-12-17 VITALS — BP 110/66 | HR 87 | Ht 60.0 in | Wt 186.4 lb

## 2023-12-17 DIAGNOSIS — Z947 Corneal transplant status: Secondary | ICD-10-CM

## 2023-12-17 DIAGNOSIS — G4700 Insomnia, unspecified: Secondary | ICD-10-CM

## 2023-12-17 DIAGNOSIS — E782 Mixed hyperlipidemia: Secondary | ICD-10-CM | POA: Diagnosis not present

## 2023-12-17 DIAGNOSIS — Z6836 Body mass index (BMI) 36.0-36.9, adult: Secondary | ICD-10-CM | POA: Insufficient documentation

## 2023-12-17 DIAGNOSIS — E1165 Type 2 diabetes mellitus with hyperglycemia: Secondary | ICD-10-CM

## 2023-12-17 DIAGNOSIS — F419 Anxiety disorder, unspecified: Secondary | ICD-10-CM

## 2023-12-17 DIAGNOSIS — I152 Hypertension secondary to endocrine disorders: Secondary | ICD-10-CM

## 2023-12-17 DIAGNOSIS — E1159 Type 2 diabetes mellitus with other circulatory complications: Secondary | ICD-10-CM

## 2023-12-17 DIAGNOSIS — E1169 Type 2 diabetes mellitus with other specified complication: Secondary | ICD-10-CM | POA: Diagnosis not present

## 2023-12-17 DIAGNOSIS — G4733 Obstructive sleep apnea (adult) (pediatric): Secondary | ICD-10-CM

## 2023-12-17 DIAGNOSIS — N1832 Chronic kidney disease, stage 3b: Secondary | ICD-10-CM

## 2023-12-17 LAB — POCT CBG (FASTING - GLUCOSE)-MANUAL ENTRY: Glucose Fasting, POC: 99 mg/dL (ref 70–99)

## 2023-12-17 MED ORDER — EMPAGLIFLOZIN 25 MG PO TABS: 25.0000 mg | ORAL_TABLET | Freq: Every day | ORAL | 6 refills | Status: AC

## 2023-12-17 MED ORDER — ROSUVASTATIN CALCIUM 40 MG PO TABS: 40.0000 mg | ORAL_TABLET | Freq: Every day | ORAL | 3 refills | Status: AC

## 2023-12-17 NOTE — Progress Notes (Signed)
 Established Patient Office Visit  Subjective:  Patient ID: Madeline Martinez, female    DOB: 04/01/1945  Age: 78 y.o. MRN: [id number]  Chief Complaint  Patient presents with   Follow-up    2 week follow up    Patient is here for follow up. She is accompanied by her daughter. She reports she has been doing well. She endorses still having issues with insomnia with Trazodone  25 mg. She reports she has not gotten in contact with her pulmonologist to get an update on her needing CPAP therapy. Recommended patient to contact pulmonologist to get that resolved prior to increasing insomnia medication to reduce any adverse effects.   She reports she has been taking her Ativan  once daily and has not noticed an increase in her anxiety symptoms since reducing her dose. Encouraged patient to only take ativan  as needed.  Patients daughter states patient has upcoming appointment with Nephrologist 11/22/23. Discussed changes that could be made to medications if kidney function continues to decline. At this time will increase Jardiance to 25 mg daily. Will hold off on reducing Metformin to once daily or reducing hydrochlorothiazide  dose. Encouraged patient to drink plenty of water daily and eat strict diet of reduced fried fatty foods, carbohydrates and concentrated sweets. Patients HbgA1c has come down to 7.3% but her triglycerides are elevated. Will switch her from Lipitor 80 mg to Crestor 40 mg and discussed starting Tricor in the future if triglyceride levels do not improve with diet.    No other concerns at this time.   Past Medical History:  Diagnosis Date   Acid reflux    Anxiety    Depression    Diabetes mellitus    Dysrhythmia    Gout    Heart murmur    HTN (hypertension)    Hyperlipidemia    Sleep apnea     Past Surgical History:  Procedure Laterality Date   ABDOMINAL HYSTERECTOMY     BACK SURGERY  02/26/2005   DISCS   BIOPSY  10/08/2018   Procedure: BIOPSY;  Surgeon: Wilhelmenia Aloha Raddle., MD;  Location: [facility];  Service: Gastroenterology;;   BREAST EXCISIONAL BIOPSY Left    COLONOSCOPY WITH PROPOFOL  N/A 12/19/2017   Procedure: COLONOSCOPY WITH PROPOFOL ;  Surgeon: Therisa Bi, MD;  Location: [facility] ENDOSCOPY;  Service: Gastroenterology;  Laterality: N/A;   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 3   BILATERAL   ENDOSCOPIC MUCOSAL RESECTION N/A 10/08/2018   Procedure: ENDOSCOPIC MUCOSAL RESECTION;  Surgeon: Wilhelmenia Aloha Raddle., MD;  Location: [facility];  Service: Gastroenterology;  Laterality: N/A;   ESOPHAGOGASTRODUODENOSCOPY (EGD) WITH PROPOFOL  N/A 08/28/2018   Procedure: ESOPHAGOGASTRODUODENOSCOPY (EGD) WITH PROPOFOL ;  Surgeon: Therisa Bi, MD;  Location: [facility] ENDOSCOPY;  Service: Gastroenterology;  Laterality: N/A;   ESOPHAGOGASTRODUODENOSCOPY (EGD) WITH PROPOFOL  N/A 10/08/2018   Procedure: ESOPHAGOGASTRODUODENOSCOPY (EGD) WITH PROPOFOL ;  Surgeon: Wilhelmenia Aloha Raddle., MD;  Location: [facility];  Service: Gastroenterology;  Laterality: N/A;   EUS N/A 10/08/2018   Procedure: UPPER ENDOSCOPIC ULTRASOUND (EUS) RADIAL;  Surgeon: Wilhelmenia Aloha Raddle., MD;  Location: [facility];  Service: Gastroenterology;  Laterality: N/A;   PARTIAL HYSTERECTOMY  02/27/1984   POLYPECTOMY  10/08/2018   Procedure: POLYPECTOMY;  Surgeon: Mansouraty, Aloha Raddle., MD;  Location: [facility];  Service: Gastroenterology;;    Social History   Socioeconomic History   Marital status: Single    Spouse name: Not on file   Number of children: 2   Years of education: Not on file   Highest education level: Not on  file  Occupational History   Not on file  Tobacco Use   Smoking status: Never   Smokeless tobacco: Never  Vaping Use   Vaping status: Never Used  Substance and Sexual Activity   Alcohol use: No   Drug use: No   Sexual activity: Not Currently    Birth control/protection: Surgical    Comment: HYST  Other Topics Concern   Not on file  Social History Narrative    Not on file   Social Drivers of Health   Financial Resource Strain: Low Risk  (07/30/2023)   Received from [facility]   Overall Financial Resource Strain (CARDIA)    Difficulty of Paying Living Expenses: Not hard at all  Food Insecurity: No Food Insecurity (07/30/2023)   Received from [facility]   Hunger Vital Sign    Within the past 12 months, you worried that your food would run out before you got the money to buy more.: Never true    Within the past 12 months, the food you bought just didn't last and you didn't have money to get more.: Never true  Transportation Needs: No Transportation Needs (07/30/2023)   Received from [facility] - Transportation    Lack of Transportation (Medical): No    Lack of Transportation (Non-Medical): No  Physical Activity: Not on file  Stress: Not on file  Social Connections: Unknown (07/26/2023)   Social Connection and Isolation Panel    Frequency of Communication with Friends and Family: Never    Frequency of Social Gatherings with Friends and Family: Never    Attends Religious Services: Never    Database administrator or Organizations: No    Attends Banker Meetings: Never    Marital Status: Patient declined  Catering manager Violence: Not At Risk (07/26/2023)   Humiliation, Afraid, Rape, and Kick questionnaire    Fear of Current or Ex-Partner: No    Emotionally Abused: No    Physically Abused: No    Sexually Abused: No    Family History  Problem Relation Age of Onset   Breast cancer Mother    Heart failure Father    Other Sister        DEGEN.DISC DISEASE   Heart attack Sister    Heart attack Brother    Breast cancer Maternal Grandmother    Kidney cancer Neg Hx    Kidney disease Neg Hx    Prostate cancer Neg Hx     Allergies  Allergen Reactions   Other Other (See Comments)    Seasonal allergy    Outpatient Medications Prior to Visit  Medication Sig   albuterol  (PROVENTIL ) (2.5 MG/3ML) 0.083%  nebulizer solution Take 3 mLs (2.5 mg total) by nebulization every 4 (four) hours as needed for wheezing or shortness of breath (please include nebulizer machine, hoses, and mask if needed.).   albuterol  (VENTOLIN  HFA) 108 (90 Base) MCG/ACT inhaler Inhale 2 puffs into the lungs every 4 (four) hours as needed for wheezing or shortness of breath.   allopurinol  (ZYLOPRIM ) 100 MG tablet Take 100 mg by mouth daily with lunch.  (Patient taking differently: Take 100 mg by mouth as needed.)   ascorbic acid  (VITAMIN C ) 1000 MG tablet Take 1,000 mg by mouth 3 (three) times daily.   aspirin  81 MG EC tablet Take 81 mg by mouth daily.   atropine  1 % ophthalmic solution Place 1 drop into the right eye 2 (two) times daily.   Biotin 5000 MCG TABS  Take 5,000 mcg by mouth daily. HAIR/SKIN/NAILS   carvedilol  (COREG ) 25 MG tablet Take 25 mg by mouth 2 (two) times daily.   cetirizine (ZYRTEC) 10 MG tablet Take 10 mg by mouth daily.   Cholecalciferol  (VITAMIN D -3) 125 MCG (5000 UT) TABS Take 5,000 Units by mouth daily.   cyanocobalamin  (VITAMIN B12) 1000 MCG/ML injection Inject 1 mL (1,000 mcg total) into the muscle every 30 (thirty) days.   cyclobenzaprine (FLEXERIL) 10 MG tablet Take 10 mg by mouth.   donepezil (ARICEPT) 10 MG tablet Take 10 mg by mouth at bedtime.   EPINEPHrine 0.3 mg/0.3 mL IJ SOAJ injection once as needed   FLUoxetine  (PROZAC ) 40 MG capsule Take 40 mg by mouth daily.   Fluticasone -Umeclidin-Vilant (TRELEGY ELLIPTA) 200-62.5-25 MCG/ACT AEPB Inhale 1 puff into the lungs.   glipiZIDE  (GLUCOTROL  XL) 5 MG 24 hr tablet Take 1 tablet (5 mg total) by mouth daily.   hydrochlorothiazide  (HYDRODIURIL ) 25 MG tablet Take 25 mg by mouth daily.   HYDROcodone -acetaminophen  (NORCO) 10-325 MG tablet Take 1 tablet by mouth every 8 (eight) hours as needed.   levothyroxine  (SYNTHROID ) 25 MCG tablet Take 25 mcg by mouth daily.   LORazepam  (ATIVAN ) 1 MG tablet Take 1 tablet (1 mg total) by mouth 2 (two) times daily as  needed for anxiety.   losartan  (COZAAR ) 100 MG tablet Take 100 mg by mouth daily. Dose decreased from 100 mg to 50 mg po daily. Monitor BP and increase dose when AKI resolve   metFORMIN (GLUCOPHAGE) 500 MG tablet Take 500 mg by mouth 2 (two) times daily.   mirabegron  ER (MYRBETRIQ ) 50 MG TB24 tablet Take 1 tablet (50 mg total) by mouth daily.   mirtazapine  (REMERON ) 15 MG tablet Take 15 mg by mouth at bedtime.   omeprazole  (PRILOSEC) 20 MG capsule Take 20 mg by mouth daily as needed.   polyethylene glycol (MIRALAX  / GLYCOLAX ) 17 g packet Take 17 g by mouth.   prednisoLONE  acetate (PRED FORTE ) 1 % ophthalmic suspension Place 1 drop into the right eye 2 (two) times daily.   pregabalin  (LYRICA ) 75 MG capsule Take 75 mg by mouth 3 (three) times daily.   Probiotic Product (PROBIOTIC PO) Take 1 tablet by mouth daily.   SYRINGE-NEEDLE, DISP, 3 ML (BD SAFETYGLIDE SYRINGE/NEEDLE) 25G X 1 3 ML MISC Use the needle for M injection once a month.   traZODone  (DESYREL ) 50 MG tablet Take 0.5 tablets (25 mg total) by mouth at bedtime.   vitamin E 45 MG (100 UNITS) capsule Take by mouth.   [DISCONTINUED] atorvastatin  (LIPITOR) 80 MG tablet Take 80 mg by mouth daily.   [DISCONTINUED] empagliflozin (JARDIANCE) 10 MG TABS tablet Take 10 mg by mouth daily.   cyanocobalamin  (VITAMIN B12) 1000 MCG tablet Take 1,000 mcg by mouth. (Patient not taking: Reported on 12/17/2023)   ferrous sulfate  325 (65 FE) MG tablet Take 325 mg by mouth daily. (Patient not taking: Reported on 12/17/2023)   PRESCRIPTION MEDICATION Place 1 drop into the left eye 6 (six) times daily.   PRESCRIPTION MEDICATION Place 1 drop into the left eye 6 (six) times daily.   PRESCRIPTION MEDICATION Place 1 drop into the left eye 6 (six) times daily.   PRESCRIPTION MEDICATION Place 1 drop into the left eye 6 (six) times daily.   No facility-administered medications prior to visit.    Review of Systems  Constitutional: Negative.  Negative for chills,  fever and malaise/fatigue.  HENT: Negative.  Negative for congestion and sore throat.  Eyes: Negative.  Negative for blurred vision and pain.  Respiratory: Negative.  Negative for cough and shortness of breath.   Cardiovascular: Negative.  Negative for chest pain, palpitations and leg swelling.  Gastrointestinal: Negative.  Negative for abdominal pain, blood in stool, constipation, diarrhea, heartburn, melena, nausea and vomiting.  Genitourinary: Negative.  Negative for dysuria, flank pain, frequency and urgency.  Musculoskeletal: Negative.  Negative for joint pain and myalgias.  Skin: Negative.   Neurological: Negative.  Negative for dizziness, tingling, sensory change, weakness and headaches.  Endo/Heme/Allergies: Negative.   Psychiatric/Behavioral: Negative.  Negative for depression and suicidal ideas. The patient is not nervous/anxious.        Objective:   BP 110/66   Pulse 87   Ht 5' (1.524 m)   Wt 186 lb 6.4 oz (84.6 kg)   SpO2 94%   BMI 36.40 kg/m   Vitals:   12/17/23 1316  BP: 110/66  Pulse: 87  Height: 5' (1.524 m)  Weight: 186 lb 6.4 oz (84.6 kg)  SpO2: 94%  BMI (Calculated): 36.4    Physical Exam Vitals and nursing note reviewed.  Constitutional:      Appearance: Normal appearance.  HENT:     Head: Normocephalic and atraumatic.     Nose: Nose normal.     Mouth/Throat:     Mouth: Mucous membranes are moist.     Pharynx: Oropharynx is clear.  Eyes:     Conjunctiva/sclera: Conjunctivae normal.     Pupils: Pupils are equal, round, and reactive to light.  Cardiovascular:     Rate and Rhythm: Normal rate and regular rhythm.     Pulses: Normal pulses.     Heart sounds: Normal heart sounds. No murmur heard. Pulmonary:     Effort: Pulmonary effort is normal.     Breath sounds: Normal breath sounds. No wheezing.  Abdominal:     General: Bowel sounds are normal.     Palpations: Abdomen is soft.     Tenderness: There is no abdominal tenderness. There is no  right CVA tenderness or left CVA tenderness.  Musculoskeletal:        General: Normal range of motion.     Cervical back: Normal range of motion.     Right lower leg: No edema.     Left lower leg: No edema.  Skin:    General: Skin is warm and dry.  Neurological:     General: No focal deficit present.     Mental Status: She is alert and oriented to person, place, and time.  Psychiatric:        Mood and Affect: Mood normal.        Behavior: Behavior normal.      Results for orders placed or performed in visit on 12/17/23  POCT CBG (Fasting - Glucose)  Result Value Ref Range   Glucose Fasting, POC 99 70 - 99 mg/dL    Recent Results (from the past 2160 hours)  Vitamin B12     Status: None   Collection Time: 11/25/23  1:34 PM  Result Value Ref Range   Vitamin B-12 454 180 - 914 pg/mL    Comment: (NOTE) This assay is not validated for testing neonatal or myeloproliferative syndrome specimens for Vitamin B12 levels. Performed at [facility], [address] [place], [place] [address]   Iron  and TIBC     Status: None   Collection Time: 11/25/23  1:34 PM  Result Value Ref Range   Iron  50  28 - 170 ug/dL   TIBC 740 749 - 549 ug/dL   Saturation Ratios 19 10.4 - 31.8 %   UIBC 209 ug/dL    Comment: Performed at [facility], [address] [place], [place] [address]  Ferritin     Status: None   Collection Time: 11/25/23  1:34 PM  Result Value Ref Range   Ferritin 165 11 - 307 ng/mL    Comment: Performed at [facility], [address] [place], [place] [address]  CBC (Cancer Center Only)     Status: None   Collection Time: 11/25/23  1:34 PM  Result Value Ref Range   WBC Count 8.1 4.0 - 10.5 K/uL   RBC 4.04 3.87 - 5.11 MIL/uL   Hemoglobin 12.2 12.0 - 15.0 g/dL   HCT 61.3 63.9 - 53.9 %   MCV 95.5 80.0 - 100.0 fL   MCH 30.2 26.0 - 34.0 pg   MCHC 31.6 30.0 - 36.0 g/dL   RDW 85.0 88.4 - 84.4 %   Platelet Count 349 150 - 400 K/uL   nRBC 0.0 0.0 -  0.2 %    Comment: Performed at [facility], [address] [place], [place] [address]  POCT CBG (Fasting - Glucose)     Status: Abnormal   Collection Time: 12/03/23  1:11 PM  Result Value Ref Range   Glucose Fasting, POC 136 (A) 70 - 99 mg/dL  CBC with Diff     Status: None   Collection Time: 12/05/23 10:03 AM  Result Value Ref Range   WBC 5.4 3.4 - 10.8 x10E3/uL   RBC 3.94 3.77 - 5.28 x10E6/uL   Hemoglobin 11.9 11.1 - 15.9 g/dL   Hematocrit 62.6 65.9 - 46.6 %   MCV 95 79 - 97 fL   MCH 30.2 26.6 - 33.0 pg   MCHC 31.9 31.5 - 35.7 g/dL   RDW 86.3 88.2 - 84.5 %   Platelets 328 150 - 450 x10E3/uL   Neutrophils 38 Not Estab. %   Lymphs 45 Not Estab. %   Monocytes 8 Not Estab. %   Eos 8 Not Estab. %   Basos 1 Not Estab. %   Neutrophils Absolute 2.0 1.4 - 7.0 x10E3/uL   Lymphocytes Absolute 2.4 0.7 - 3.1 x10E3/uL   Monocytes Absolute 0.4 0.1 - 0.9 x10E3/uL   EOS (ABSOLUTE) 0.4 0.0 - 0.4 x10E3/uL   Basophils Absolute 0.1 0.0 - 0.2 x10E3/uL   Immature Granulocytes 0 Not Estab. %   Immature Grans (Abs) 0.0 0.0 - 0.1 x10E3/uL  CMP14+EGFR     Status: Abnormal   Collection Time: 12/05/23 10:03 AM  Result Value Ref Range   Glucose 134 (H) 70 - 99 mg/dL   BUN 15 8 - 27 mg/dL   Creatinine, Ser 8.68 (H) 0.57 - 1.00 mg/dL   eGFR 42 (L) >40 fO/fpw/8.26   BUN/Creatinine Ratio 11 (L) 12 - 28   Sodium 139 134 - 144 mmol/L   Potassium 5.1 3.5 - 5.2 mmol/L   Chloride 101 96 - 106 mmol/L   CO2 23 20 - 29 mmol/L   Calcium  9.5 8.7 - 10.3 mg/dL   Total Protein 6.5 6.0 - 8.5 g/dL   Albumin 4.1 3.8 - 4.8 g/dL   Globulin, Total 2.4 1.5 - 4.5 g/dL   Bilirubin Total <9.7 0.0 - 1.2 mg/dL   Alkaline Phosphatase 83 49 - 135 IU/L   AST 15 0 - 40 IU/L   ALT 19 0 - 32  IU/L  Lipid Panel w/o Chol/HDL Ratio     Status: Abnormal   Collection Time: 12/05/23 10:03 AM  Result Value Ref Range   Cholesterol, Total 177 100 - 199 mg/dL   Triglycerides 755 (H) 0 - 149 mg/dL   HDL 49 >60 mg/dL   VLDL  Cholesterol Cal 41 (H) 5 - 40 mg/dL   LDL Chol Calc (NIH) 87 0 - 99 mg/dL  Hemoglobin J8r     Status: Abnormal   Collection Time: 12/05/23 10:03 AM  Result Value Ref Range   Hgb A1c MFr Bld 7.3 (H) 4.8 - 5.6 %    Comment:          Prediabetes: 5.7 - 6.4          Diabetes: >6.4          Glycemic control for adults with diabetes: <7.0    Est. average glucose Bld gHb Est-mCnc 163 mg/dL  UDY+[id number]     Status: Abnormal   Collection Time: 12/05/23 10:03 AM  Result Value Ref Range   TSH 2.610 0.450 - 4.500 uIU/mL   T3, Free 2.3 2.0 - 4.4 pg/mL   Free T4 0.76 (L) 0.82 - 1.77 ng/dL  Vitamin B12     Status: None   Collection Time: 12/05/23 10:03 AM  Result Value Ref Range   Vitamin B-12 1,043 232 - 1,245 pg/mL  Uric acid     Status: None   Collection Time: 12/05/23 10:04 AM  Result Value Ref Range   Uric Acid 5.7 3.1 - 7.9 mg/dL    Comment:            Therapeutic target for gout patients: <6.0  Microalbumin / Creatinine Urine Ratio     Status: None   Collection Time: 12/05/23 10:09 AM  Result Value Ref Range   Creatinine, Urine 142.1 Not Estab. mg/dL   Microalbumin, Urine 83.1 Not Estab. ug/mL   Microalb/Creat Ratio 12 0 - 29 mg/g creat    Comment:                        Normal:                0 -  29                        Moderately increased: 30 - 300                        Severely increased:       >300   POCT CBG (Fasting - Glucose)     Status: Normal   Collection Time: 12/17/23  1:24 PM  Result Value Ref Range   Glucose Fasting, POC 99 70 - 99 mg/dL      Assessment & Plan:  Increase Jardiance to 25 mg daily. Hold off on additional medication changes at this time. Keep upcoming specialists appointments. Continue other medications as prescribed. Reinforced healthy diet and exercise as tolerated. Patient to reach out to Pulmonologist about completed sleep study and needing CPAP. Problem List Items Addressed This Visit     Hypertension associated with diabetes (HCC)    Relevant Medications   empagliflozin (JARDIANCE) 25 MG TABS tablet   rosuvastatin (CRESTOR) 40 MG tablet   Stage 3a chronic kidney disease (HCC)   History of cornea transplant   Anxiety   Diabetes mellitus, type II (HCC) - Primary   Relevant Medications  empagliflozin (JARDIANCE) 25 MG TABS tablet   rosuvastatin (CRESTOR) 40 MG tablet   Other Relevant Orders   POCT CBG (Fasting - Glucose) (Completed)   Mixed diabetic hyperlipidemia associated with type 2 diabetes mellitus (HCC)   Relevant Medications   empagliflozin (JARDIANCE) 25 MG TABS tablet   rosuvastatin (CRESTOR) 40 MG tablet   OSA (obstructive sleep apnea)   Insomnia   Body mass index (BMI) of 36.0-36.9 in adult    Return in about 3 months (around 03/18/2024).   Total time spent: 25 minutes. This time includes review of previous notes and results and patient face to face interaction during today's visit.    FERNAND FREDY RAMAN, MD  12/17/2023   This document may have been prepared by [facility] Voice Recognition software and as such may include unintentional dictation errors.

## 2023-12-24 ENCOUNTER — Ambulatory Visit: Admitting: Cardiovascular Disease

## 2023-12-24 ENCOUNTER — Telehealth: Payer: Self-pay

## 2023-12-24 ENCOUNTER — Encounter: Payer: Self-pay | Admitting: Cardiovascular Disease

## 2023-12-24 VITALS — BP 128/76 | HR 92 | Ht 60.0 in | Wt 187.4 lb

## 2023-12-24 DIAGNOSIS — I152 Hypertension secondary to endocrine disorders: Secondary | ICD-10-CM

## 2023-12-24 DIAGNOSIS — R002 Palpitations: Secondary | ICD-10-CM | POA: Diagnosis not present

## 2023-12-24 DIAGNOSIS — E1159 Type 2 diabetes mellitus with other circulatory complications: Secondary | ICD-10-CM | POA: Diagnosis not present

## 2023-12-24 DIAGNOSIS — G4733 Obstructive sleep apnea (adult) (pediatric): Secondary | ICD-10-CM

## 2023-12-24 DIAGNOSIS — N1832 Chronic kidney disease, stage 3b: Secondary | ICD-10-CM

## 2023-12-24 DIAGNOSIS — E782 Mixed hyperlipidemia: Secondary | ICD-10-CM | POA: Diagnosis not present

## 2023-12-24 DIAGNOSIS — E1169 Type 2 diabetes mellitus with other specified complication: Secondary | ICD-10-CM

## 2023-12-24 DIAGNOSIS — R0602 Shortness of breath: Secondary | ICD-10-CM

## 2023-12-24 DIAGNOSIS — Z6836 Body mass index (BMI) 36.0-36.9, adult: Secondary | ICD-10-CM

## 2023-12-24 NOTE — Progress Notes (Addendum)
   12/24/2023  Patient ID: Madeline Martinez, female   DOB: 1945-08-14, 78 y.o.   MRN: [id number]  Received referral request from PCP care team to assist with medication management. Attempted to contact patient for schedule appointment. Left HIPAA compliant message for patient to return my call at their convenience.   Patient returned call, reviewed various services that I offer. Patient declines to schedule anything today, will reach in future if she feels this changes.  Jon VEAR Lindau, PharmD Clinical Pharmacist [phone]

## 2023-12-24 NOTE — Progress Notes (Signed)
 Cardiology Office Note   Date:  12/24/2023   ID:  Madeline Martinez, DOB November 19, 1945, MRN [id number]  PCP:  Fernand Fredy RAMAN, MD  Cardiologist:  Denyse Fernand, MD      History of Present Illness: Madeline Martinez is a 78 y.o. female who presents for  Chief Complaint  Patient presents with   Consult    Hypertenison assoicated with diabetes.      18 YOAF presents for evaluation as has been on coreg  25 bid. Has occasional palpitation and breathing really hard. No chest pains.      Past Medical History:  Diagnosis Date   Acid reflux    Anxiety    Depression    Diabetes mellitus    Dysrhythmia    Gout    Heart murmur    HTN (hypertension)    Hyperlipidemia    Sleep apnea      Past Surgical History:  Procedure Laterality Date   ABDOMINAL HYSTERECTOMY     BACK SURGERY  02/26/2005   DISCS   BIOPSY  10/08/2018   Procedure: BIOPSY;  Surgeon: Wilhelmenia Aloha Raddle., MD;  Location: [facility];  Service: Gastroenterology;;   BREAST EXCISIONAL BIOPSY Left    COLONOSCOPY WITH PROPOFOL  N/A 12/19/2017   Procedure: COLONOSCOPY WITH PROPOFOL ;  Surgeon: Therisa Bi, MD;  Location: [facility] ENDOSCOPY;  Service: Gastroenterology;  Laterality: N/A;   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 49   BILATERAL   ENDOSCOPIC MUCOSAL RESECTION N/A 10/08/2018   Procedure: ENDOSCOPIC MUCOSAL RESECTION;  Surgeon: Wilhelmenia Aloha Raddle., MD;  Location: [facility];  Service: Gastroenterology;  Laterality: N/A;   ESOPHAGOGASTRODUODENOSCOPY (EGD) WITH PROPOFOL  N/A 08/28/2018   Procedure: ESOPHAGOGASTRODUODENOSCOPY (EGD) WITH PROPOFOL ;  Surgeon: Therisa Bi, MD;  Location: [facility]COPY;  Service: Gastroenterology;  Laterality: N/A;   ESOPHAGOGASTRODUODENOSCOPY (EGD) WITH PROPOFOL  N/A 10/08/2018   Procedure: ESOPHAGOGASTRODUODENOSCOPY (EGD) WITH PROPOFOL ;  Surgeon: Wilhelmenia Aloha Raddle., MD;  Location: [facility];  Service: Gastroenterology;  Laterality: N/A;   EUS N/A 10/08/2018   Procedure: UPPER  ENDOSCOPIC ULTRASOUND (EUS) RADIAL;  Surgeon: Wilhelmenia Aloha Raddle., MD;  Location: [facility];  Service: Gastroenterology;  Laterality: N/A;   PARTIAL HYSTERECTOMY  02/27/1984   POLYPECTOMY  10/08/2018   Procedure: POLYPECTOMY;  Surgeon: Mansouraty, Aloha Raddle., MD;  Location: [facility];  Service: Gastroenterology;;     Current Outpatient Medications  Medication Sig Dispense Refill   albuterol  (PROVENTIL ) (2.5 MG/3ML) 0.083% nebulizer solution Take 3 mLs (2.5 mg total) by nebulization every 4 (four) hours as needed for wheezing or shortness of breath (please include nebulizer machine, hoses, and mask if needed.). 60 mL 0   albuterol  (VENTOLIN  HFA) 108 (90 Base) MCG/ACT inhaler Inhale 2 puffs into the lungs every 4 (four) hours as needed for wheezing or shortness of breath. 18 g 0   allopurinol  (ZYLOPRIM ) 100 MG tablet Take 100 mg by mouth daily with lunch.  (Patient taking differently: Take 100 mg by mouth as needed.)     ascorbic acid  (VITAMIN C ) 1000 MG tablet Take 1,000 mg by mouth 3 (three) times daily.     aspirin  81 MG EC tablet Take 81 mg by mouth daily.     atropine  1 % ophthalmic solution Place 1 drop into the right eye 2 (two) times daily.     Biotin 5000 MCG TABS Take 5,000 mcg by mouth daily. HAIR/SKIN/NAILS     carvedilol  (COREG ) 25 MG tablet Take 25 mg by mouth 2 (two) times daily.     cetirizine (ZYRTEC) 10 MG  tablet Take 10 mg by mouth daily.     Cholecalciferol  (VITAMIN D -3) 125 MCG (5000 UT) TABS Take 5,000 Units by mouth daily.     cyanocobalamin  (VITAMIN B12) 1000 MCG/ML injection Inject 1 mL (1,000 mcg total) into the muscle every 30 (thirty) days. 4 mL 1   cyclobenzaprine (FLEXERIL) 10 MG tablet Take 10 mg by mouth.     donepezil (ARICEPT) 10 MG tablet Take 10 mg by mouth at bedtime.     empagliflozin (JARDIANCE) 25 MG TABS tablet Take 1 tablet (25 mg total) by mouth daily before breakfast. 30 tablet 6   EPINEPHrine 0.3 mg/0.3 mL IJ SOAJ injection once as needed      FLUoxetine  (PROZAC ) 40 MG capsule Take 40 mg by mouth daily.     Fluticasone -Umeclidin-Vilant (TRELEGY ELLIPTA) 200-62.5-25 MCG/ACT AEPB Inhale 1 puff into the lungs.     glipiZIDE  (GLUCOTROL  XL) 5 MG 24 hr tablet Take 1 tablet (5 mg total) by mouth daily. 30 tablet 0   hydrochlorothiazide  (HYDRODIURIL ) 25 MG tablet Take 25 mg by mouth daily.     HYDROcodone -acetaminophen  (NORCO) 10-325 MG tablet Take 1 tablet by mouth every 8 (eight) hours as needed.     levothyroxine  (SYNTHROID ) 25 MCG tablet Take 25 mcg by mouth daily.     LORazepam  (ATIVAN ) 1 MG tablet Take 1 tablet (1 mg total) by mouth 2 (two) times daily as needed for anxiety. 30 tablet 0   losartan  (COZAAR ) 100 MG tablet Take 100 mg by mouth daily. Dose decreased from 100 mg to 50 mg po daily. Monitor BP and increase dose when AKI resolve     metFORMIN (GLUCOPHAGE) 500 MG tablet Take 500 mg by mouth 2 (two) times daily.     mirabegron  ER (MYRBETRIQ ) 50 MG TB24 tablet Take 1 tablet (50 mg total) by mouth daily. 90 tablet 3   mirtazapine  (REMERON ) 15 MG tablet Take 15 mg by mouth at bedtime.     omeprazole  (PRILOSEC) 20 MG capsule Take 20 mg by mouth daily as needed.     polyethylene glycol (MIRALAX  / GLYCOLAX ) 17 g packet Take 17 g by mouth.     prednisoLONE  acetate (PRED FORTE ) 1 % ophthalmic suspension Place 1 drop into the right eye 2 (two) times daily.     pregabalin  (LYRICA ) 75 MG capsule Take 75 mg by mouth 3 (three) times daily.     Probiotic Product (PROBIOTIC PO) Take 1 tablet by mouth daily.     rosuvastatin (CRESTOR) 40 MG tablet Take 1 tablet (40 mg total) by mouth daily. 90 tablet 3   SYRINGE-NEEDLE, DISP, 3 ML (BD SAFETYGLIDE SYRINGE/NEEDLE) 25G X 1 3 ML MISC Use the needle for M injection once a month. 30 each 1   traZODone  (DESYREL ) 50 MG tablet Take 0.5 tablets (25 mg total) by mouth at bedtime. 15 tablet 1   vitamin E 45 MG (100 UNITS) capsule Take by mouth.     PRESCRIPTION MEDICATION Place 1 drop into the left eye 6  (six) times daily.     PRESCRIPTION MEDICATION Place 1 drop into the left eye 6 (six) times daily.     PRESCRIPTION MEDICATION Place 1 drop into the left eye 6 (six) times daily.     PRESCRIPTION MEDICATION Place 1 drop into the left eye 6 (six) times daily.     No current facility-administered medications for this visit.    Allergies:   Other    Social History:   reports that she has  never smoked. She has never used smokeless tobacco. She reports that she does not drink alcohol and does not use drugs.   Family History:  family history includes Breast cancer in her maternal grandmother and mother; Heart attack in her brother and sister; Heart failure in her father; Other in her sister.    ROS:     Review of Systems  Constitutional: Negative.   HENT: Negative.    Eyes: Negative.   Respiratory: Negative.    Gastrointestinal: Negative.   Genitourinary: Negative.   Musculoskeletal: Negative.   Skin: Negative.   Neurological: Negative.   Endo/Heme/Allergies: Negative.   Psychiatric/Behavioral: Negative.    All other systems reviewed and are negative.     All other systems are reviewed and negative.    PHYSICAL EXAM: VS:  BP 128/76   Pulse 92   Ht 5' (1.524 m)   Wt 187 lb 6.4 oz (85 kg)   SpO2 93%   BMI 36.60 kg/m  , BMI Body mass index is 36.6 kg/m. Last weight:  Wt Readings from Last 3 Encounters:  12/24/23 187 lb 6.4 oz (85 kg)  12/17/23 186 lb 6.4 oz (84.6 kg)  12/03/23 182 lb (82.6 kg)     Physical Exam Constitutional:      Appearance: Normal appearance.  Cardiovascular:     Rate and Rhythm: Normal rate and regular rhythm.     Heart sounds: Normal heart sounds.  Pulmonary:     Effort: Pulmonary effort is normal.     Breath sounds: Normal breath sounds.  Musculoskeletal:     Right lower leg: No edema.     Left lower leg: No edema.  Neurological:     Mental Status: She is alert.       EKG:   Recent Labs: 04/13/2023: Magnesium  2.3 12/05/2023: ALT  19; BUN 15; Creatinine, Ser 1.31; Hemoglobin 11.9; Platelets 328; Potassium 5.1; Sodium 139; TSH 2.610    Lipid Panel    Component Value Date/Time   CHOL 177 12/05/2023 1003   TRIG 244 (H) 12/05/2023 1003   HDL 49 12/05/2023 1003   LDLCALC 87 12/05/2023 1003      Other studies Reviewed: Additional studies/ records that were reviewed today include:  Review of the above records demonstrates:       No data to display            ASSESSMENT AND PLAN:    ICD-10-CM   1. Palpitation  R00.2 PCV ECHOCARDIOGRAM COMPLETE    MYOCARDIAL PERFUSION IMAGING    CARDIAC EVENT MONITOR   Advise getting holter.    2. CKD stage 3b, GFR 30-44 ml/min (HCC)  N18.32 PCV ECHOCARDIOGRAM COMPLETE    MYOCARDIAL PERFUSION IMAGING    CARDIAC EVENT MONITOR    3. Hypertension associated with diabetes (HCC)  E11.59 PCV ECHOCARDIOGRAM COMPLETE   I15.2 MYOCARDIAL PERFUSION IMAGING    CARDIAC EVENT MONITOR    4. Mixed diabetic hyperlipidemia associated with type 2 diabetes mellitus (HCC)  E11.69 PCV ECHOCARDIOGRAM COMPLETE   E78.2 MYOCARDIAL PERFUSION IMAGING    CARDIAC EVENT MONITOR    5. OSA (obstructive sleep apnea)  G47.33 PCV ECHOCARDIOGRAM COMPLETE    MYOCARDIAL PERFUSION IMAGING    CARDIAC EVENT MONITOR    6. Body mass index (BMI) of 36.0-36.9 in adult  Z68.36 PCV ECHOCARDIOGRAM COMPLETE    MYOCARDIAL PERFUSION IMAGING    CARDIAC EVENT MONITOR    7. SOB (shortness of breath)  R06.02 PCV ECHOCARDIOGRAM COMPLETE    MYOCARDIAL PERFUSION IMAGING  CARDIAC EVENT MONITOR   Get echo, and stress test.       Problem List Items Addressed This Visit       Cardiovascular and Mediastinum   Hypertension associated with diabetes (HCC)   Relevant Orders   PCV ECHOCARDIOGRAM COMPLETE   MYOCARDIAL PERFUSION IMAGING   CARDIAC EVENT MONITOR     Respiratory   OSA (obstructive sleep apnea)   Relevant Orders   PCV ECHOCARDIOGRAM COMPLETE   MYOCARDIAL PERFUSION IMAGING   CARDIAC EVENT MONITOR      Endocrine   Mixed diabetic hyperlipidemia associated with type 2 diabetes mellitus (HCC)   Relevant Orders   PCV ECHOCARDIOGRAM COMPLETE   MYOCARDIAL PERFUSION IMAGING   CARDIAC EVENT MONITOR     Other   Body mass index (BMI) of 36.0-36.9 in adult   Relevant Orders   PCV ECHOCARDIOGRAM COMPLETE   MYOCARDIAL PERFUSION IMAGING   CARDIAC EVENT MONITOR   Other Visit Diagnoses       Palpitation    -  Primary   Advise getting holter.   Relevant Orders   PCV ECHOCARDIOGRAM COMPLETE   MYOCARDIAL PERFUSION IMAGING   CARDIAC EVENT MONITOR     CKD stage 3b, GFR 30-44 ml/min (HCC)       Relevant Orders   PCV ECHOCARDIOGRAM COMPLETE   MYOCARDIAL PERFUSION IMAGING   CARDIAC EVENT MONITOR     SOB (shortness of breath)       Get echo, and stress test.   Relevant Orders   PCV ECHOCARDIOGRAM COMPLETE   MYOCARDIAL PERFUSION IMAGING   CARDIAC EVENT MONITOR          Disposition:   Return in about 4 weeks (around 01/21/2024) for echo, stress test and holter and f/u.    Total time spent: 50 minutes  Signed,  Denyse Bathe, MD  12/24/2023 1:55 PM    [facility]

## 2023-12-31 ENCOUNTER — Ambulatory Visit (INDEPENDENT_AMBULATORY_CARE_PROVIDER_SITE_OTHER)

## 2023-12-31 DIAGNOSIS — N1832 Chronic kidney disease, stage 3b: Secondary | ICD-10-CM

## 2023-12-31 DIAGNOSIS — E1169 Type 2 diabetes mellitus with other specified complication: Secondary | ICD-10-CM

## 2023-12-31 DIAGNOSIS — Z6836 Body mass index (BMI) 36.0-36.9, adult: Secondary | ICD-10-CM

## 2023-12-31 DIAGNOSIS — R002 Palpitations: Secondary | ICD-10-CM

## 2023-12-31 DIAGNOSIS — G4733 Obstructive sleep apnea (adult) (pediatric): Secondary | ICD-10-CM

## 2023-12-31 DIAGNOSIS — I152 Hypertension secondary to endocrine disorders: Secondary | ICD-10-CM

## 2023-12-31 DIAGNOSIS — R0602 Shortness of breath: Secondary | ICD-10-CM

## 2024-01-16 ENCOUNTER — Encounter

## 2024-01-22 ENCOUNTER — Other Ambulatory Visit

## 2024-02-05 ENCOUNTER — Encounter: Payer: Self-pay | Admitting: Anesthesiology

## 2024-02-05 ENCOUNTER — Other Ambulatory Visit: Payer: Self-pay

## 2024-02-05 ENCOUNTER — Ambulatory Visit
Admission: RE | Admit: 2024-02-05 | Discharge: 2024-02-05 | Disposition: A | Attending: Gastroenterology | Admitting: Gastroenterology

## 2024-02-05 ENCOUNTER — Encounter
Admission: RE | Disposition: A | Discharge status: Home / Self Care | Payer: Self-pay | Source: Home / Self Care | Attending: Gastroenterology

## 2024-02-05 ENCOUNTER — Ambulatory Visit: Payer: Self-pay | Admitting: Anesthesiology

## 2024-02-05 ENCOUNTER — Encounter: Payer: Self-pay | Admitting: Gastroenterology

## 2024-02-05 DIAGNOSIS — I10 Essential (primary) hypertension: Secondary | ICD-10-CM | POA: Diagnosis not present

## 2024-02-05 DIAGNOSIS — F419 Anxiety disorder, unspecified: Secondary | ICD-10-CM | POA: Diagnosis not present

## 2024-02-05 DIAGNOSIS — R1310 Dysphagia, unspecified: Secondary | ICD-10-CM | POA: Diagnosis present

## 2024-02-05 DIAGNOSIS — K219 Gastro-esophageal reflux disease without esophagitis: Secondary | ICD-10-CM | POA: Diagnosis not present

## 2024-02-05 DIAGNOSIS — G4730 Sleep apnea, unspecified: Secondary | ICD-10-CM | POA: Diagnosis not present

## 2024-02-05 DIAGNOSIS — E119 Type 2 diabetes mellitus without complications: Secondary | ICD-10-CM | POA: Diagnosis not present

## 2024-02-05 DIAGNOSIS — K449 Diaphragmatic hernia without obstruction or gangrene: Secondary | ICD-10-CM | POA: Diagnosis not present

## 2024-02-05 DIAGNOSIS — E039 Hypothyroidism, unspecified: Secondary | ICD-10-CM | POA: Diagnosis not present

## 2024-02-05 DIAGNOSIS — Z860100 Personal history of colon polyps, unspecified: Secondary | ICD-10-CM | POA: Diagnosis not present

## 2024-02-05 DIAGNOSIS — Z538 Procedure and treatment not carried out for other reasons: Secondary | ICD-10-CM | POA: Diagnosis not present

## 2024-02-05 DIAGNOSIS — Z1211 Encounter for screening for malignant neoplasm of colon: Secondary | ICD-10-CM | POA: Diagnosis not present

## 2024-02-05 DIAGNOSIS — Z7984 Long term (current) use of oral hypoglycemic drugs: Secondary | ICD-10-CM | POA: Diagnosis not present

## 2024-02-05 DIAGNOSIS — J45909 Unspecified asthma, uncomplicated: Secondary | ICD-10-CM | POA: Diagnosis not present

## 2024-02-05 DIAGNOSIS — K222 Esophageal obstruction: Secondary | ICD-10-CM | POA: Diagnosis not present

## 2024-02-05 HISTORY — PX: ESOPHAGOGASTRODUODENOSCOPY: SHX5428

## 2024-02-05 HISTORY — PX: COLONOSCOPY: SHX5424

## 2024-02-05 HISTORY — PX: ESOPHAGEAL DILATION: SHX303

## 2024-02-05 LAB — GLUCOSE, CAPILLARY: Glucose-Capillary: 139 mg/dL — ABNORMAL HIGH (ref 70–99)

## 2024-02-05 SURGERY — COLONOSCOPY
Anesthesia: General

## 2024-02-05 MED ORDER — PROPOFOL 500 MG/50ML IV EMUL
INTRAVENOUS | Status: DC | PRN
  Administered 2024-02-05: 135 ug/kg/min via INTRAVENOUS

## 2024-02-05 MED ORDER — PROPOFOL 10 MG/ML IV BOLUS
INTRAVENOUS | Status: DC | PRN
  Administered 2024-02-05: 30 mg via INTRAVENOUS

## 2024-02-05 MED ORDER — PROPOFOL 10 MG/ML IV BOLUS
INTRAVENOUS | Status: AC
  Filled 2024-02-05: qty 40

## 2024-02-05 MED ORDER — SODIUM CHLORIDE 0.9 % IV SOLN: INTRAVENOUS | Status: DC

## 2024-02-05 NOTE — Anesthesia Preprocedure Evaluation (Addendum)
 Anesthesia Evaluation  Patient identified by MRN, date of birth, ID band Patient awake    Reviewed: Allergy & Precautions, H&P , NPO status , Patient's Chart, lab work & pertinent test results  Airway Mallampati: II  TM Distance: >3 FB Neck ROM: full    Dental  (+) Edentulous Lower, Edentulous Upper   Pulmonary asthma , sleep apnea    Pulmonary exam normal        Cardiovascular hypertension, Normal cardiovascular exam     Neuro/Psych  PSYCHIATRIC DISORDERS Anxiety        GI/Hepatic Neg liver ROS,GERD  ,,  Endo/Other  diabetes, Type 2Hypothyroidism    Renal/GU CRFRenal disease  negative genitourinary   Musculoskeletal   Abdominal  (+) + obese  Peds  Hematology negative hematology ROS (+)   Anesthesia Other Findings 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   Past Medical History: No date: Acid reflux No date: Anxiety No date: Depression No date: Diabetes mellitus No date: Dysrhythmia No date: Gout No date: Heart murmur No date: HTN (hypertension) No date: Hyperlipidemia No date: Sleep apnea  Past Surgical History: No date: ABDOMINAL HYSTERECTOMY 02/26/2005: BACK SURGERY     Comment:  DISCS 10/08/2018: BIOPSY     Comment:  Procedure: BIOPSY;  Surgeon: Wilhelmenia Aloha Raddle.,               MD;  Location: [facility];  Service: Gastroenterology;; No date: BREAST EXCISIONAL BIOPSY; Left 12/19/2017: COLONOSCOPY WITH PROPOFOL ; N/A     Comment:  Procedure: COLONOSCOPY WITH PROPOFOL ;  Surgeon: Therisa Bi, MD;  Location: [facility] ENDOSCOPY;  Service:               Gastroenterology;  Laterality: N/A; APPROX AGE 49: CORNEAL TRANSPLANT     Comment:  BILATERAL 10/08/2018: ENDOSCOPIC MUCOSAL RESECTION; N/A     Comment:  Procedure: ENDOSCOPIC MUCOSAL RESECTION;  Surgeon:               Wilhelmenia Aloha Raddle., MD;  Location: [facility];                Service: Gastroenterology;  Laterality:  N/A; 08/28/2018: ESOPHAGOGASTRODUODENOSCOPY (EGD) WITH PROPOFOL ; N/A     Comment:  Procedure: ESOPHAGOGASTRODUODENOSCOPY (EGD) WITH               PROPOFOL ;  Surgeon: Therisa Bi, MD;  Location: [facility]               ENDOSCOPY;  Service: Gastroenterology;  Laterality: N/A; 10/08/2018: ESOPHAGOGASTRODUODENOSCOPY (EGD) WITH PROPOFOL ; N/A     Comment:  Procedure: ESOPHAGOGASTRODUODENOSCOPY (EGD) WITH               PROPOFOL ;  Surgeon: Wilhelmenia Aloha Raddle., MD;                Location: [facility] ENDOSCOPY;  Service: Gastroenterology;                Laterality: N/A; 10/08/2018: EUS; N/A     Comment:  Procedure: UPPER ENDOSCOPIC ULTRASOUND (EUS) RADIAL;                Surgeon: Wilhelmenia Aloha Raddle., MD;  Location: [facility]               ENDOSCOPY;  Service: Gastroenterology;  Laterality: N/A; 02/27/1984: PARTIAL HYSTERECTOMY 10/08/2018: POLYPECTOMY     Comment:  Procedure: POLYPECTOMY;  Surgeon: Wilhelmenia Aloha  Mickey., MD;  Location: [facility];  Service:               Gastroenterology;;     Reproductive/Obstetrics negative OB ROS                              Anesthesia Physical Anesthesia Plan  ASA: 3  Anesthesia Plan: General   Post-op Pain Management: Minimal or no pain anticipated   Induction: Intravenous  PONV Risk Score and Plan: Propofol  infusion and TIVA  Airway Management Planned: Natural Airway and Nasal Cannula  Additional Equipment:   Intra-op Plan:   Post-operative Plan:   Informed Consent: I have reviewed the patients History and Physical, chart, labs and discussed the procedure including the risks, benefits and alternatives for the proposed anesthesia with the patient or authorized representative who has indicated his/her understanding and acceptance.     Dental Advisory Given  Plan Discussed with: CRNA and Surgeon  Anesthesia Plan Comments:          Anesthesia Quick Evaluation

## 2024-02-05 NOTE — Anesthesia Procedure Notes (Signed)
 Procedure Name: MAC Date/Time: 02/05/2024 10:46 AM  Performed by: Brandy Almarie BROCKS, CRNAPre-anesthesia Checklist: Emergency Drugs available, Patient identified, Suction available and Patient being monitored Oxygen Delivery Method: Simple face mask

## 2024-02-05 NOTE — Progress Notes (Signed)
 Positioning patient for colonoscopy and upon taking pullup off, pullup was full of brown yellow mushy stool. Colonoscopy aborted/cancelled

## 2024-02-05 NOTE — Transfer of Care (Signed)
 Immediate Anesthesia Transfer of Care Note  Patient: Madeline Martinez  Procedure(s) Performed: COLONOSCOPY EGD (ESOPHAGOGASTRODUODENOSCOPY)  Patient Location: PACU  Anesthesia Type:General  Level of Consciousness: awake, alert , and oriented  Airway & Oxygen Therapy: Patient Spontanous Breathing  Post-op Assessment: Report given to RN, Post -op Vital signs reviewed and stable, and Patient moving all extremities X 4  Post vital signs: Reviewed and stable  Last Vitals:  Vitals Value Taken Time  BP 136/88 02/05/24 11:00  Temp    Pulse 87 02/05/24 11:01  Resp 12 02/05/24 11:01  SpO2 95 % 02/05/24 11:01  Vitals shown include unfiled device data.  Last Pain:  Vitals:   02/05/24 1010  TempSrc: Tympanic  PainSc: 0-No pain         Complications: No notable events documented.

## 2024-02-05 NOTE — H&P (Signed)
 Ruel Kung , MD [address], [name], [place], [address] Phone: [phone] Fax: [phone]  Primary Care Physician:  Fernand Fredy RAMAN, MD   Pre-Procedure History & Physical: HPI:  Madeline Martinez is a 78 y.o. female is here for an endoscopy and colonoscopy    Past Medical History:  Diagnosis Date   Acid reflux    Anxiety    Depression    Diabetes mellitus    Dysrhythmia    Gout    Heart murmur    HTN (hypertension)    Hyperlipidemia    Sleep apnea     Past Surgical History:  Procedure Laterality Date   ABDOMINAL HYSTERECTOMY     BACK SURGERY  02/26/2005   DISCS   BIOPSY  10/08/2018   Procedure: BIOPSY;  Surgeon: Wilhelmenia Aloha Raddle., MD;  Location: [facility];  Service: Gastroenterology;;   BREAST EXCISIONAL BIOPSY Left    COLONOSCOPY WITH PROPOFOL  N/A 12/19/2017   Procedure: COLONOSCOPY WITH PROPOFOL ;  Surgeon: Kung Ruel, MD;  Location: [facility]COPY;  Service: Gastroenterology;  Laterality: N/A;   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   ENDOSCOPIC MUCOSAL RESECTION N/A 10/08/2018   Procedure: ENDOSCOPIC MUCOSAL RESECTION;  Surgeon: Wilhelmenia Aloha Raddle., MD;  Location: [facility];  Service: Gastroenterology;  Laterality: N/A;   ESOPHAGOGASTRODUODENOSCOPY (EGD) WITH PROPOFOL  N/A 08/28/2018   Procedure: ESOPHAGOGASTRODUODENOSCOPY (EGD) WITH PROPOFOL ;  Surgeon: Kung Ruel, MD;  Location: [facility] ENDOSCOPY;  Service: Gastroenterology;  Laterality: N/A;   ESOPHAGOGASTRODUODENOSCOPY (EGD) WITH PROPOFOL  N/A 10/08/2018   Procedure: ESOPHAGOGASTRODUODENOSCOPY (EGD) WITH PROPOFOL ;  Surgeon: Wilhelmenia Aloha Raddle., MD;  Location: [facility];  Service: Gastroenterology;  Laterality: N/A;   EUS N/A 10/08/2018   Procedure: UPPER ENDOSCOPIC ULTRASOUND (EUS) RADIAL;  Surgeon: Wilhelmenia Aloha Raddle., MD;  Location: [facility];  Service: Gastroenterology;  Laterality: N/A;   PARTIAL HYSTERECTOMY  02/27/1984   POLYPECTOMY  10/08/2018    Procedure: POLYPECTOMY;  Surgeon: Mansouraty, Aloha Raddle., MD;  Location: [facility];  Service: Gastroenterology;;    Prior to Admission medications   Medication Sig Start Date End Date Taking? Authorizing Provider  albuterol  (PROVENTIL ) (2.5 MG/3ML) 0.083% nebulizer solution Take 3 mLs (2.5 mg total) by nebulization every 4 (four) hours as needed for wheezing or shortness of breath (please include nebulizer machine, hoses, and mask if needed.). 01/24/22   Alexander, Natalie, DO  albuterol  (VENTOLIN  HFA) 108 (90 Base) MCG/ACT inhaler Inhale 2 puffs into the lungs every 4 (four) hours as needed for wheezing or shortness of breath. 01/24/22   Alexander, Natalie, DO  allopurinol  (ZYLOPRIM ) 100 MG tablet Take 100 mg by mouth daily with lunch.  Patient taking differently: Take 100 mg by mouth as needed. 04/30/16   [provider]  ascorbic acid  (VITAMIN C ) 1000 MG tablet Take 1,000 mg by mouth 3 (three) times daily.    [provider]  aspirin  81 MG EC tablet Take 81 mg by mouth daily.    [provider]  atropine  1 % ophthalmic solution Place 1 drop into the right eye 2 (two) times daily. 04/06/21   [provider]  Biotin 5000 MCG TABS Take 5,000 mcg by mouth daily. HAIR/SKIN/NAILS    [provider]  carvedilol  (COREG ) 25 MG tablet Take 25 mg by mouth 2 (two) times daily.    [provider]  cetirizine (ZYRTEC) 10 MG tablet Take 10 mg by mouth daily. 04/20/17   [provider]  Cholecalciferol  (VITAMIN D -3) 125 MCG (5000 UT) TABS  Take 5,000 Units by mouth daily.    [provider]  cyanocobalamin  (VITAMIN B12) 1000 MCG/ML injection Inject 1 mL (1,000 mcg total) into the muscle every 30 (thirty) days. 08/26/23   Melanee Annah BROCKS, MD  cyclobenzaprine (FLEXERIL) 10 MG tablet Take 10 mg by mouth. 03/10/23   [provider]  donepezil (ARICEPT) 10 MG tablet Take 10 mg by mouth at bedtime.    [provider]  empagliflozin   (JARDIANCE ) 25 MG TABS tablet Take 1 tablet (25 mg total) by mouth daily before breakfast. 12/17/23   Fernand Fredy RAMAN, MD  EPINEPHrine 0.3 mg/0.3 mL IJ SOAJ injection once as needed    [provider]  FLUoxetine  (PROZAC ) 40 MG capsule Take 40 mg by mouth daily.    [provider]  Fluticasone -Umeclidin-Vilant (TRELEGY ELLIPTA) 200-62.5-25 MCG/ACT AEPB Inhale 1 puff into the lungs. 06/12/23   [provider]  glipiZIDE  (GLUCOTROL  XL) 5 MG 24 hr tablet Take 1 tablet (5 mg total) by mouth daily. 05/04/21   Patel, Sona, MD  hydrochlorothiazide  (HYDRODIURIL ) 25 MG tablet Take 25 mg by mouth daily. 04/15/23   Von Bellis, MD  HYDROcodone -acetaminophen  (NORCO) 10-325 MG tablet Take 1 tablet by mouth every 8 (eight) hours as needed. 08/02/21   [provider]  levothyroxine  (SYNTHROID ) 25 MCG tablet Take 25 mcg by mouth daily. 02/01/20   [provider]  LORazepam  (ATIVAN ) 1 MG tablet Take 1 tablet (1 mg total) by mouth 2 (two) times daily as needed for anxiety. 05/20/21   Jhonny Calvin NOVAK, MD  losartan  (COZAAR ) 100 MG tablet Take 100 mg by mouth daily. Dose decreased from 100 mg to 50 mg po daily. Monitor BP and increase dose when AKI resolve 04/15/23   Von Bellis, MD  metFORMIN (GLUCOPHAGE) 500 MG tablet Take 500 mg by mouth 2 (two) times daily.    [provider]  mirabegron  ER (MYRBETRIQ ) 50 MG TB24 tablet Take 1 tablet (50 mg total) by mouth daily. 08/14/21   Helon Kirsch A, PA-C  mirtazapine  (REMERON ) 15 MG tablet Take 15 mg by mouth at bedtime.    [provider]  omeprazole  (PRILOSEC) 20 MG capsule Take 20 mg by mouth daily as needed. 05/24/21   [provider]  polyethylene glycol (MIRALAX  / GLYCOLAX ) 17 g packet Take 17 g by mouth.    [provider]  prednisoLONE  acetate (PRED FORTE ) 1 % ophthalmic suspension Place 1 drop into the right eye 2 (two) times daily. 04/24/21   [provider]  pregabalin  (LYRICA )  75 MG capsule Take 75 mg by mouth 3 (three) times daily.    [provider]  PRESCRIPTION MEDICATION Place 1 drop into the left eye 6 (six) times daily.    [provider]  PRESCRIPTION MEDICATION Place 1 drop into the left eye 6 (six) times daily.    [provider]  PRESCRIPTION MEDICATION Place 1 drop into the left eye 6 (six) times daily.    [provider]  PRESCRIPTION MEDICATION Place 1 drop into the left eye 6 (six) times daily.    [provider]  Probiotic Product (PROBIOTIC PO) Take 1 tablet by mouth daily.    [provider]  rosuvastatin  (CRESTOR ) 40 MG tablet Take 1 tablet (40 mg total) by mouth daily. 12/17/23   Fernand Fredy RAMAN, MD  SYRINGE-NEEDLE, DISP, 3 ML (BD SAFETYGLIDE SYRINGE/NEEDLE) 25G X 1 3 ML MISC Use the needle for M injection once a month. 08/26/23  Melanee Annah BROCKS, MD  traZODone  (DESYREL ) 50 MG tablet Take 0.5 tablets (25 mg total) by mouth at bedtime. 12/03/23   Fernand Fredy RAMAN, MD  vitamin E 45 MG (100 UNITS) capsule Take by mouth.    [provider]    Allergies as of 12/30/2023 - Review Complete 12/24/2023  Allergen Reaction Noted   Other Other (See Comments) 05/09/2023    Family History  Problem Relation Age of Onset   Breast cancer Mother    Heart failure Father    Other Sister        DEGEN.DISC DISEASE   Heart attack Sister    Heart attack Brother    Breast cancer Maternal Grandmother    Kidney cancer Neg Hx    Kidney disease Neg Hx    Prostate cancer Neg Hx     Social History   Socioeconomic History   Marital status: Single    Spouse name: Not on file   Number of children: 2   Years of education: Not on file   Highest education level: Not on file  Occupational History   Not on file  Tobacco Use   Smoking status: Never   Smokeless tobacco: Never  Vaping Use   Vaping status: Never Used  Substance and Sexual Activity   Alcohol use: No   Drug use: No   Sexual activity: Not  Currently    Birth control/protection: Surgical    Comment: HYST  Other Topics Concern   Not on file  Social History Narrative   Not on file   Social Drivers of Health   Financial Resource Strain: Low Risk  (12/30/2023)   Received from [facility]   Overall Financial Resource Strain (CARDIA)    Difficulty of Paying Living Expenses: Not hard at all  Food Insecurity: No Food Insecurity (12/30/2023)   Received from [facility]   Hunger Vital Sign    Within the past 12 months, you worried that your food would run out before you got the money to buy more.: Never true    Within the past 12 months, the food you bought just didn't last and you didn't have money to get more.: Never true  Transportation Needs: No Transportation Needs (12/30/2023)   Received from [facility]    In the past 12 months, has lack of transportation kept you from medical appointments or from getting medications?: No    Lack of Transportation (Non-Medical): No  Physical Activity: Not on file  Stress: Not on file  Social Connections: Unknown (07/26/2023)   Social Connection and Isolation Panel    Frequency of Communication with Friends and Family: Never    Frequency of Social Gatherings with Friends and Family: Never    Attends Religious Services: Never    Database Administrator or Organizations: No    Attends Banker Meetings: Never    Marital Status: Patient declined  Catering Manager Violence: Not At Risk (07/26/2023)   Humiliation, Afraid, Rape, and Kick questionnaire    Fear of Current or Ex-Partner: No    Emotionally Abused: No    Physically Abused: No    Sexually Abused: No    Review of Systems: See HPI, otherwise negative ROS  Physical Exam: There were no vitals taken for this visit. General:   Alert,  pleasant and cooperative in NAD Head:  Normocephalic and atraumatic. Neck:  Supple; no masses or  thyromegaly. Lungs:  Clear  throughout to auscultation, normal respiratory effort.    Heart:  +S1, +S2, Regular rate and rhythm, No edema. Abdomen:  Soft, nontender and nondistended. Normal bowel sounds, without guarding, and without rebound.   Neurologic:  Alert and  oriented x4;  grossly normal neurologically.  Impression/Plan: Madeline Martinez is here for an endoscopy and colonoscopy  to be performed for  evaluation of dysphagia and personal history of colon polyps    Risks, benefits, limitations, and alternatives regarding endoscopy have been reviewed with the patient.  Questions have been answered.  All parties agreeable.   Ruel Kung, MD  02/05/2024, 9:59 AM d

## 2024-02-05 NOTE — Op Note (Signed)
 [facility] Gastroenterology Patient Name: Madeline Martinez Procedure Date: 02/05/2024 10:38 AM MRN: [id number] Account #: [id number] Date of Birth: 13-Oct-1945 Admit Type: Outpatient Age: 78 Room: [facility] ENDO ROOM 3 Gender: Female Note Status: Finalized Instrument Name: Endoscope [id number] Procedure:             Upper GI endoscopy Indications:           Dysphagia Providers:             Ruel Kung MD, MD Referring MD:          Fredy CANDIE Bathe, MD (Referring MD) Medicines:             Monitored Anesthesia Care Complications:         No immediate complications. Procedure:             Pre-Anesthesia Assessment:                        - Prior to the procedure, a History and Physical was                         performed, and patient medications, allergies and                         sensitivities were reviewed. The patient's tolerance                         of previous anesthesia was reviewed.                        - The risks and benefits of the procedure and the                         sedation options and risks were discussed with the                         patient. All questions were answered and informed                         consent was obtained.                        - ASA Grade Assessment: II - A patient with mild                         systemic disease.                        After obtaining informed consent, the endoscope was                         passed under direct vision. Throughout the procedure,                         the patient's blood pressure, pulse, and oxygen                         saturations were monitored continuously. The Endoscope                         was introduced through  the mouth, and advanced to the                         third part of duodenum. The upper GI endoscopy was                         accomplished with ease. The patient tolerated the                         procedure well. Findings:      The examined duodenum was  normal.      A medium-sized hiatal hernia was present.      One benign-appearing, intrinsic mild stenosis was found 35 cm from the       incisors. This stenosis measured 1.6 cm (inner diameter). The stenosis       was traversed. A TTS dilator was passed through the scope. Dilation with       a 15-16.5-18 mm balloon dilator was performed to 18 mm. The dilation       site was examined and showed no change. dilator dragged inflated to the       proximal esophagus with no resistance      The exam was otherwise without abnormality. Impression:            - Normal examined duodenum.                        - Medium-sized hiatal hernia.                        - Benign-appearing esophageal stenosis. Dilated.                        - The examination was otherwise normal.                        - No specimens collected. Recommendation:        - Perform a colonoscopy today. Procedure Code(s):     --- Professional ---                        [phone], Esophagogastroduodenoscopy, flexible,                         transoral; with transendoscopic balloon dilation of                         esophagus (less than 30 mm diameter) Diagnosis Code(s):     --- Professional ---                        K44.9, Diaphragmatic hernia without obstruction or                         gangrene                        K22.2, Esophageal obstruction                        R13.10, Dysphagia, unspecified CPT copyright 2022 American Medical Association. All rights reserved. The codes documented in this report are preliminary and upon coder review may  be revised to meet current compliance requirements. Ruel Kung, MD Ruel Kung MD, MD 02/05/2024 10:55:02 AM This report has been signed electronically. Number of Addenda: 0 Note Initiated On: 02/05/2024 10:38 AM Estimated Blood Loss:  Estimated blood loss: none.      [facility]

## 2024-02-06 ENCOUNTER — Other Ambulatory Visit: Payer: Self-pay | Admitting: Internal Medicine

## 2024-02-06 MED ORDER — GLIPIZIDE ER 5 MG PO TB24: 5.0000 mg | ORAL_TABLET | Freq: Every day | ORAL | 3 refills | Status: DC

## 2024-02-06 MED ORDER — HYDROCHLOROTHIAZIDE 25 MG PO TABS: 25.0000 mg | ORAL_TABLET | Freq: Every day | ORAL | 3 refills | Status: AC

## 2024-02-06 MED ORDER — CETIRIZINE HCL 10 MG PO TABS: 10.0000 mg | ORAL_TABLET | Freq: Every day | ORAL | 3 refills | Status: AC

## 2024-02-06 NOTE — Anesthesia Postprocedure Evaluation (Signed)
 Anesthesia Post Note  Patient: Madeline Martinez  Procedure(s) Performed: COLONOSCOPY EGD (ESOPHAGOGASTRODUODENOSCOPY) ESOPHAGOSCOPY, WITH ESOPHAGEAL DILATION  Patient location during evaluation: Endoscopy Anesthesia Type: General Level of consciousness: awake and alert Pain management: pain level controlled Vital Signs Assessment: post-procedure vital signs reviewed and stable Respiratory status: spontaneous breathing, nonlabored ventilation and respiratory function stable Cardiovascular status: blood pressure returned to baseline and stable Postop Assessment: no apparent nausea or vomiting Anesthetic complications: no   No notable events documented.   Last Vitals:  Vitals:   02/05/24 1110 02/05/24 1120  BP: 136/83 (!) 145/84  Pulse: 82 81  Resp: 13 12  Temp:    SpO2: 94% 94%    Last Pain:  Vitals:   02/06/24 0737  TempSrc:   PainSc: 0-No pain                 [name]

## 2024-02-10 ENCOUNTER — Ambulatory Visit: Admitting: Oncology

## 2024-02-10 ENCOUNTER — Inpatient Hospital Stay: Attending: Oncology

## 2024-02-10 ENCOUNTER — Other Ambulatory Visit: Payer: Self-pay

## 2024-02-10 ENCOUNTER — Encounter: Payer: Self-pay | Admitting: Oncology

## 2024-02-10 VITALS — BP 106/46 | HR 87 | Temp 96.6°F | Ht 60.0 in | Wt 184.0 lb

## 2024-02-10 DIAGNOSIS — E538 Deficiency of other specified B group vitamins: Secondary | ICD-10-CM | POA: Insufficient documentation

## 2024-02-10 DIAGNOSIS — D509 Iron deficiency anemia, unspecified: Secondary | ICD-10-CM | POA: Diagnosis present

## 2024-02-10 DIAGNOSIS — D649 Anemia, unspecified: Secondary | ICD-10-CM

## 2024-02-10 LAB — CBC (CANCER CENTER ONLY)
HCT: 39.8 % (ref 36.0–46.0)
Hemoglobin: 12.3 g/dL (ref 12.0–15.0)
MCH: 30.4 pg (ref 26.0–34.0)
MCHC: 30.9 g/dL (ref 30.0–36.0)
MCV: 98.3 fL (ref 80.0–100.0)
Platelet Count: 335 K/uL (ref 150–400)
RBC: 4.05 MIL/uL (ref 3.87–5.11)
RDW: 14.9 % (ref 11.5–15.5)
WBC Count: 7.3 K/uL (ref 4.0–10.5)
nRBC: 0 % (ref 0.0–0.2)

## 2024-02-10 LAB — IRON AND TIBC
Iron: 69 ug/dL (ref 28–170)
Saturation Ratios: 23 % (ref 10.4–31.8)
TIBC: 297 ug/dL (ref 250–450)
UIBC: 228 ug/dL

## 2024-02-10 LAB — FERRITIN: Ferritin: 277 ng/mL (ref 11–307)

## 2024-02-10 LAB — VITAMIN B12: Vitamin B-12: 340 pg/mL (ref 180–914)

## 2024-02-10 MED ORDER — BD SAFETYGLIDE SYRINGE/NEEDLE 25G X 1" 3 ML MISC: 1 refills | Status: AC

## 2024-02-10 MED ORDER — CYANOCOBALAMIN 1000 MCG/ML IJ SOLN: 1000.0000 ug | INTRAMUSCULAR | 1 refills | Status: AC

## 2024-02-10 NOTE — Progress Notes (Signed)
 Hematology/Oncology Consult note [facility]  [phone] [phone]  Patient Care Team: Fernand Fredy RAMAN, MD as PCP - General (Internal Medicine) Melanee Annah BROCKS, MD as Consulting Physician (Oncology)   Name of the patient: Madeline Martinez  [id number]  1945-04-18   Date of visit: 02/10/2024  Diagnosis-normocytic anemia likely combination of iron  and B12 deficiency  Chief complaint/ Reason for visit-routine follow-up of anemia  Heme/Onc history: patient is a 78 year old female With a past medical history significant for hypertension hyperlipidemia type 2 diabetes obstructive sleep apnea hypothyroidism depression and GERD among other medical problems.  She has been referred for anemia and thrombocytopenia.  Labs from 06/28/2023 showed serum creatinine of 1.7.  Total protein normal at 6.6.  LFTs normal.  White count was normal at 8.6, H&H of 10.1/32.2 with an MCV of 95.3.  Platelets mildly elevated at 532.  B12 levels normal at 302.  TSH normal at 1.83.  A1c elevated at 7.5.  Looking back at her prior CBCs her hemoglobin was around 12 up until early February 2025 and following that it drifted down to the tens.  Around the same time her creatinine has been between 1.5-1.7.   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  She is scheduled to undergo cataract surgery now that her infection has healed in a couple of weeks time   Results of blood work from 07/26/2023 were as follows: Ferritin levels normal at 82.  Iron  saturation 13%.  B12 levels low at 248.  Reticulocyte count low for the degree of anemia at 1.1.  Myeloma panel did not show any evidence of M protein.  Haptoglobin elevated at 563 LDH low at 96.    Interval history- Discussed the use of AI scribe software for clinical note transcription with the patient, who gave verbal consent to  proceed.  History of Present Illness   Madeline Martinez is a 78 year old female with anemia and vitamin B12 deficiency who presents for hematology follow-up to assess stability of her anemia and management of B12 supplementation.  Six months ago, she received iron  infusions for anemia when her hemoglobin was in the tens. Her hemoglobin has since improved and is currently 12.3, within normal limits. She is awaiting updated iron  studies. She does not report symptoms suggestive of recurrent anemia.  She administers B12 injections at home for vitamin B12 deficiency but missed her injection this month due to running out of medication.  She describes persistent visual disturbances, with partial improvement but not full resolution. She is able to perceive shadows of objects but cannot read her iPad, and on some days, images on the television appear cloudy. She notes variability in her vision, with intermittent good and bad days.       ECOG PS- 0 Pain scale- 0   Review of systems- Review of Systems  Constitutional:  Negative for chills, fever, malaise/fatigue and weight loss.  HENT:  Negative for congestion, ear discharge and nosebleeds.   Eyes:  Negative for blurred vision.  Respiratory:  Negative for cough, hemoptysis, sputum production, shortness of breath and wheezing.   Cardiovascular:  Negative for chest pain, palpitations, orthopnea and claudication.  Gastrointestinal:  Negative for abdominal pain, blood in stool, constipation, diarrhea, heartburn, melena, nausea and vomiting.  Genitourinary:  Negative for dysuria, flank pain, frequency, hematuria and urgency.  Musculoskeletal:  Negative for back pain, joint  pain and myalgias.  Skin:  Negative for rash.  Neurological:  Negative for dizziness, tingling, focal weakness, seizures, weakness and headaches.  Endo/Heme/Allergies:  Does not bruise/bleed easily.  Psychiatric/Behavioral:  Negative for depression and suicidal ideas. The patient does  not have insomnia.       Allergies[1]   Past Medical History:  Diagnosis Date   Acid reflux    Anxiety    Depression    Diabetes mellitus    Dysrhythmia    Gout    Heart murmur    HTN (hypertension)    Hyperlipidemia    Sleep apnea      Past Surgical History:  Procedure Laterality Date   ABDOMINAL HYSTERECTOMY     BACK SURGERY  02/26/2005   DISCS   BIOPSY  10/08/2018   Procedure: BIOPSY;  Surgeon: Wilhelmenia Aloha Raddle., MD;  Location: [facility];  Service: Gastroenterology;;   BREAST EXCISIONAL BIOPSY Left    COLONOSCOPY N/A 02/05/2024   Procedure: COLONOSCOPY;  Surgeon: Therisa Bi, MD;  Location[facility];  Service: Gastroenterology;  Laterality: N/A;   COLONOSCOPY WITH PROPOFOL  N/A 12/19/2017   Procedure: COLONOSCOPY WITH PROPOFOL ;  Surgeon: Therisa Bi, MD;  Location: [facility] ENDOSCOPY;  Service: Gastroenterology;  Laterality: N/A;   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   ENDOSCOPIC MUCOSAL RESECTION N/A 10/08/2018   Procedure: ENDOSCOPIC MUCOSAL RESECTION;  Surgeon: Wilhelmenia Aloha Raddle., MD;  Location: [facility];  Service: Gastroenterology;  Laterality: N/A;   ESOPHAGEAL DILATION  02/05/2024   Procedure: ESOPHAGOSCOPY, WITH ESOPHAGEAL DILATION;  Surgeon: Therisa Bi, MD;  Location: [facility] ENDOSCOPY;  Service: Gastroenterology;;   ESOPHAGOGASTRODUODENOSCOPY N/A 02/05/2024   Procedure: EGD (ESOPHAGOGASTRODUODENOSCOPY);  Surgeon: Therisa Bi, MD;  Location: [facility] END[facility]COPY;  Service: Gastroenterology;  Laterality: N/A;   ESOPHAGOGASTRODUODENOSCOPY (EGD) WITH PROPOFOL  N/A 08/28/2018   Procedure: ESOPHAGOGASTRODUODENOSCOPY (EGD) WITH PROPOFOL ;  Surgeon: Therisa Bi, MD;  Location: [facility] ENDOSCOPY;  Service: Gastroenterology;  Laterality: N/A;   ESOPHAGOGASTRODUODENOSCOPY (EGD) WITH PROPOFOL  N/A 10/08/2018   Procedure: ESOPHAGOGASTRODUODENOSCOPY (EGD) WITH PROPOFOL ;  Surgeon: Wilhelmenia Aloha Raddle., MD;  Location: [facility];  Service:  Gastroenterology;  Laterality: N/A;   EUS N/A 10/08/2018   Procedure: UPPER ENDOSCOPIC ULTRASOUND (EUS) RADIAL;  Surgeon: Wilhelmenia Aloha Raddle., MD;  Location: [facility];  Service: Gastroenterology;  Laterality: N/A;   PARTIAL HYSTERECTOMY  02/27/1984   POLYPECTOMY  10/08/2018   Procedure: POLYPECTOMY;  Surgeon: Mansouraty, Aloha Raddle., MD;  Location: [facility];  Service: Gastroenterology;;    Social History   Socioeconomic History   Marital status: Single    Spouse name: Not on file   Number of children: 2   Years of education: Not on file   Highest education level: Not on file  Occupational History   Not on file  Tobacco Use   Smoking status: Never   Smokeless tobacco: Never  Vaping Use   Vaping status: Never Used  Substance and Sexual Activity   Alcohol use: No   Drug use: No   Sexual activity: Not Currently    Birth control/protection: Surgical    Comment: HYST  Other Topics Concern   Not on file  Social History Narrative   Not on file   Social Drivers of Health   Tobacco Use: Low Risk (02/10/2024)   Patient History    Smoking Tobacco Use: Never    Smokeless Tobacco Use: Never    Passive Exposure: Not on file  Financial Resource Strain: Low Risk  (12/30/2023)   Received from [facility]  Overall Financial Resource Strain (CARDIA)    Difficulty of Paying Living Expenses: Not hard at all  Food Insecurity: No Food Insecurity (12/30/2023)   Received from [facility]   Epic    Within the past 12 months, you worried that your food would run out before you got the money to buy more.: Never true    Within the past 12 months, the food you bought just didn't last and you didn't have money to get more.: Never true  Transportation Needs: No Transportation Needs (12/30/2023)   Received from [facility] - Transportation    In the past 12 months, has lack of transportation kept you from medical  appointments or from getting medications?: No    Lack of Transportation (Non-Medical): No  Physical Activity: Not on file  Stress: Not on file  Social Connections: Unknown (07/26/2023)   Social Connection and Isolation Panel    Frequency of Communication with Friends and Family: Never    Frequency of Social Gatherings with Friends and Family: Never    Attends Religious Services: Never    Database Administrator or Organizations: No    Attends Banker Meetings: Never    Marital Status: Patient declined  Catering Manager Violence: Not At Risk (07/26/2023)   Humiliation, Afraid, Rape, and Kick questionnaire    Fear of Current or Ex-Partner: No    Emotionally Abused: No    Physically Abused: No    Sexually Abused: No  Depression (PHQ2-9): Low Risk (02/10/2024)   Depression (PHQ2-9)    PHQ-2 Score: 0  Alcohol Screen: Low Risk (07/26/2023)   Alcohol Screen    Last Alcohol Screening Score (AUDIT): 0  Housing: Unknown (12/30/2023)   Received from [facility]   Epic    In the last 12 months, was there a time when you were not able to pay the mortgage or rent on time?: No    Number of Times Moved in the Last Year: Not on file    At any time in the past 12 months, were you homeless or living in a shelter (including now)?: No  Utilities: Not At Risk (12/30/2023)   Received from [facility]   Epic    In the past 12 months has the electric, gas, oil, or water company threatened to shut off services in your home?: No  Health Literacy: Not on file    Family History  Problem Relation Age of Onset   Breast cancer Mother    Heart failure Father    Other Sister        DEGEN.DISC DISEASE   Heart attack Sister    Heart attack Brother    Breast cancer Maternal Grandmother    Kidney cancer Neg Hx    Kidney disease Neg Hx    Prostate cancer Neg Hx     Current Medications[2]  Physical exam:  Vitals:   02/10/24 1324  BP: (!) 106/46  Pulse: 87   Temp: (!) 96.6 F (35.9 C)  TempSrc: Tympanic  SpO2: 96%  Weight: 184 lb (83.5 kg)  Height: 5' (1.524 m)   Physical Exam Constitutional:      Comments: Sitting in a wheelchair.  Appears in no acute distress.  Blindness in the right eye.  Cardiovascular:     Rate and Rhythm: Normal rate and regular rhythm.     Heart sounds: Normal heart sounds.  Pulmonary:     Effort: Pulmonary effort  is normal.     Breath sounds: Normal breath sounds.  Abdominal:     General: Bowel sounds are normal.     Palpations: Abdomen is soft.  Skin:    General: Skin is warm and dry.  Neurological:     Mental Status: She is alert and oriented to person, place, and time.      I have personally reviewed labs listed below:    Latest Ref Rng & Units 12/05/2023   10:03 AM  CMP  Glucose 70 - 99 mg/dL 865   BUN 8 - 27 mg/dL 15   Creatinine 9.42 - 1.00 mg/dL 8.68   Sodium 865 - 855 mmol/L 139   Potassium 3.5 - 5.2 mmol/L 5.1   Chloride 96 - 106 mmol/L 101   CO2 20 - 29 mmol/L 23   Calcium  8.7 - 10.3 mg/dL 9.5   Total Protein 6.0 - 8.5 g/dL 6.5   Total Bilirubin 0.0 - 1.2 mg/dL <9.7   Alkaline Phos 49 - 135 IU/L 83   AST 0 - 40 IU/L 15   ALT 0 - 32 IU/L 19       Latest Ref Rng & Units 02/10/2024    1:07 PM  CBC  WBC 4.0 - 10.5 K/uL 7.3   Hemoglobin 12.0 - 15.0 g/dL 87.6   Hematocrit 63.9 - 46.0 % 39.8   Platelets 150 - 400 K/uL 335       Assessment and plan- Patient is a 78 y.o. female here for routine follow-up of iron  and B12 deficiency anemia  Assessment and Plan    Anemia Anemia improved with prior iron  infusions; hemoglobin normal. No further IV iron  needed. - Awaited iron  studies results. - Scheduled repeat blood work in four months. - Scheduled follow-up visit in eight months. - Conditional plan to contact her if laboratory results change.  Vitamin B12 deficiency Vitamin B12 deficiency managed with home injections; missed dose due to running out of medication. Resuming  supplementation appropriate. - Renewed prescription for vitamin B12 injections. - Instructed her to resume vitamin B12 injections at home.         Visit Diagnosis 1. B12 deficiency   2. Iron  deficiency anemia, unspecified iron  deficiency anemia type      Dr. Annah Skene, MD, MPH [facility] at [facility] [phone] 02/10/2024 4:35 PM                   [1]  Allergies Allergen Reactions   Other Other (See Comments)    Seasonal allergy  [2]  Current Outpatient Medications:    albuterol  (PROVENTIL ) (2.5 MG/3ML) 0.083% nebulizer solution, Take 3 mLs (2.5 mg total) by nebulization every 4 (four) hours as needed for wheezing or shortness of breath (please include nebulizer machine, hoses, and mask if needed.)., Disp: 60 mL, Rfl: 0   albuterol  (VENTOLIN  HFA) 108 (90 Base) MCG/ACT inhaler, Inhale 2 puffs into the lungs every 4 (four) hours as needed for wheezing or shortness of breath., Disp: 18 g, Rfl: 0   allopurinol  (ZYLOPRIM ) 100 MG tablet, Take 100 mg by mouth daily with lunch.  (Patient taking differently: Take 100 mg by mouth as needed.), Disp: , Rfl:    ascorbic acid  (VITAMIN C ) 1000 MG tablet, Take 1,000 mg by mouth 3 (three) times daily., Disp: , Rfl:    aspirin  81 MG EC tablet, Take 81 mg by mouth daily., Disp: , Rfl:    atropine  1 % ophthalmic solution, Place 1 drop into the  right eye 2 (two) times daily., Disp: , Rfl:    Biotin 5000 MCG TABS, Take 5,000 mcg by mouth daily. HAIR/SKIN/NAILS, Disp: , Rfl:    carvedilol  (COREG ) 25 MG tablet, Take 25 mg by mouth 2 (two) times daily., Disp: , Rfl:    cetirizine  (ZYRTEC ) 10 MG tablet, Take 1 tablet (10 mg total) by mouth daily., Disp: 90 tablet, Rfl: 3   Cholecalciferol  (VITAMIN D -3) 125 MCG (5000 UT) TABS, Take 5,000 Units by mouth daily., Disp: , Rfl:    cyanocobalamin  (VITAMIN B12) 1000 MCG/ML injection, Inject 1 mL (1,000 mcg total) into the muscle every 30 (thirty) days., Disp: 4 mL, Rfl: 1    cyclobenzaprine (FLEXERIL) 10 MG tablet, Take 10 mg by mouth., Disp: , Rfl:    donepezil (ARICEPT) 10 MG tablet, Take 10 mg by mouth at bedtime., Disp: , Rfl:    empagliflozin  (JARDIANCE ) 25 MG TABS tablet, Take 1 tablet (25 mg total) by mouth daily before breakfast., Disp: 30 tablet, Rfl: 6   EPINEPHrine 0.3 mg/0.3 mL IJ SOAJ injection, once as needed, Disp: , Rfl:    FLUoxetine  (PROZAC ) 40 MG capsule, Take 40 mg by mouth daily., Disp: , Rfl:    Fluticasone -Umeclidin-Vilant (TRELEGY ELLIPTA) 200-62.5-25 MCG/ACT AEPB, Inhale 1 puff into the lungs., Disp: , Rfl:    glipiZIDE  (GLUCOTROL  XL) 5 MG 24 hr tablet, Take 1 tablet (5 mg total) by mouth daily., Disp: 90 tablet, Rfl: 3   hydrochlorothiazide  (HYDRODIURIL ) 25 MG tablet, Take 1 tablet (25 mg total) by mouth daily., Disp: 90 tablet, Rfl: 3   HYDROcodone -acetaminophen  (NORCO) 10-325 MG tablet, Take 1 tablet by mouth every 8 (eight) hours as needed., Disp: , Rfl:    levothyroxine  (SYNTHROID ) 25 MCG tablet, Take 25 mcg by mouth daily., Disp: , Rfl:    LORazepam  (ATIVAN ) 1 MG tablet, Take 1 tablet (1 mg total) by mouth 2 (two) times daily as needed for anxiety., Disp: 30 tablet, Rfl: 0   losartan  (COZAAR ) 100 MG tablet, Take 100 mg by mouth daily. Dose decreased from 100 mg to 50 mg po daily. Monitor BP and increase dose when AKI resolve, Disp: , Rfl:    metFORMIN (GLUCOPHAGE) 500 MG tablet, Take 500 mg by mouth 2 (two) times daily., Disp: , Rfl:    mirabegron  ER (MYRBETRIQ ) 50 MG TB24 tablet, Take 1 tablet (50 mg total) by mouth daily., Disp: 90 tablet, Rfl: 3   mirtazapine  (REMERON ) 15 MG tablet, Take 15 mg by mouth at bedtime., Disp: , Rfl:    omeprazole  (PRILOSEC) 20 MG capsule, Take 20 mg by mouth daily as needed., Disp: , Rfl:    polyethylene glycol (MIRALAX  / GLYCOLAX ) 17 g packet, Take 17 g by mouth., Disp: , Rfl:    prednisoLONE  acetate (PRED FORTE ) 1 % ophthalmic suspension, Place 1 drop into the right eye 2 (two) times daily., Disp: , Rfl:     pregabalin  (LYRICA ) 75 MG capsule, Take 75 mg by mouth 3 (three) times daily., Disp: , Rfl:    PRESCRIPTION MEDICATION, Place 1 drop into the left eye 6 (six) times daily., Disp: , Rfl:    Probiotic Product (PROBIOTIC PO), Take 1 tablet by mouth daily., Disp: , Rfl:    rosuvastatin  (CRESTOR ) 40 MG tablet, Take 1 tablet (40 mg total) by mouth daily., Disp: 90 tablet, Rfl: 3   SYRINGE-NEEDLE, DISP, 3 ML (BD SAFETYGLIDE SYRINGE/NEEDLE) 25G X 1 3 ML MISC, Use the needle for M injection once a month., Disp: 30 each, Rfl:  1   traZODone  (DESYREL ) 50 MG tablet, Take 0.5 tablets (25 mg total) by mouth at bedtime., Disp: 15 tablet, Rfl: 1   vitamin E 45 MG (100 UNITS) capsule, Take by mouth., Disp: , Rfl:

## 2024-02-25 ENCOUNTER — Ambulatory Visit: Admitting: Oncology

## 2024-02-25 ENCOUNTER — Other Ambulatory Visit

## 2024-03-06 ENCOUNTER — Ambulatory Visit

## 2024-03-06 DIAGNOSIS — R0602 Shortness of breath: Secondary | ICD-10-CM

## 2024-03-06 DIAGNOSIS — I340 Nonrheumatic mitral (valve) insufficiency: Secondary | ICD-10-CM

## 2024-03-06 DIAGNOSIS — E1169 Type 2 diabetes mellitus with other specified complication: Secondary | ICD-10-CM

## 2024-03-06 DIAGNOSIS — I371 Nonrheumatic pulmonary valve insufficiency: Secondary | ICD-10-CM

## 2024-03-06 DIAGNOSIS — G4733 Obstructive sleep apnea (adult) (pediatric): Secondary | ICD-10-CM

## 2024-03-06 DIAGNOSIS — I361 Nonrheumatic tricuspid (valve) insufficiency: Secondary | ICD-10-CM | POA: Diagnosis not present

## 2024-03-06 DIAGNOSIS — R002 Palpitations: Secondary | ICD-10-CM

## 2024-03-06 DIAGNOSIS — Z6836 Body mass index (BMI) 36.0-36.9, adult: Secondary | ICD-10-CM

## 2024-03-06 DIAGNOSIS — I152 Hypertension secondary to endocrine disorders: Secondary | ICD-10-CM

## 2024-03-06 DIAGNOSIS — N1832 Chronic kidney disease, stage 3b: Secondary | ICD-10-CM

## 2024-03-12 ENCOUNTER — Ambulatory Visit

## 2024-03-12 DIAGNOSIS — R0602 Shortness of breath: Secondary | ICD-10-CM | POA: Diagnosis not present

## 2024-03-12 DIAGNOSIS — Z6836 Body mass index (BMI) 36.0-36.9, adult: Secondary | ICD-10-CM

## 2024-03-12 DIAGNOSIS — E1159 Type 2 diabetes mellitus with other circulatory complications: Secondary | ICD-10-CM

## 2024-03-12 DIAGNOSIS — R002 Palpitations: Secondary | ICD-10-CM

## 2024-03-12 DIAGNOSIS — N1832 Chronic kidney disease, stage 3b: Secondary | ICD-10-CM

## 2024-03-12 DIAGNOSIS — G4733 Obstructive sleep apnea (adult) (pediatric): Secondary | ICD-10-CM

## 2024-03-12 DIAGNOSIS — E1169 Type 2 diabetes mellitus with other specified complication: Secondary | ICD-10-CM

## 2024-03-12 MED ORDER — TECHNETIUM TC 99M SESTAMIBI GENERIC - CARDIOLITE
10.7000 | Freq: Once | INTRAVENOUS | Status: AC | PRN
  Administered 2024-03-12: 10.7 via INTRAVENOUS

## 2024-03-12 MED ORDER — TECHNETIUM TC 99M SESTAMIBI GENERIC - CARDIOLITE
30.3000 | Freq: Once | INTRAVENOUS | Status: AC | PRN
  Administered 2024-03-12: 30.3 via INTRAVENOUS

## 2024-03-17 ENCOUNTER — Ambulatory Visit: Admitting: Cardiovascular Disease

## 2024-03-17 ENCOUNTER — Other Ambulatory Visit: Payer: Self-pay | Admitting: Internal Medicine

## 2024-03-17 ENCOUNTER — Encounter: Payer: Self-pay | Admitting: Cardiovascular Disease

## 2024-03-17 VITALS — BP 114/62 | HR 94 | Ht 60.0 in | Wt 183.6 lb

## 2024-03-17 DIAGNOSIS — I5033 Acute on chronic diastolic (congestive) heart failure: Secondary | ICD-10-CM | POA: Diagnosis not present

## 2024-03-17 DIAGNOSIS — E1169 Type 2 diabetes mellitus with other specified complication: Secondary | ICD-10-CM

## 2024-03-17 DIAGNOSIS — E538 Deficiency of other specified B group vitamins: Secondary | ICD-10-CM

## 2024-03-17 DIAGNOSIS — E1159 Type 2 diabetes mellitus with other circulatory complications: Secondary | ICD-10-CM

## 2024-03-17 DIAGNOSIS — E1165 Type 2 diabetes mellitus with hyperglycemia: Secondary | ICD-10-CM

## 2024-03-17 DIAGNOSIS — I340 Nonrheumatic mitral (valve) insufficiency: Secondary | ICD-10-CM | POA: Diagnosis not present

## 2024-03-17 DIAGNOSIS — I152 Hypertension secondary to endocrine disorders: Secondary | ICD-10-CM | POA: Diagnosis not present

## 2024-03-17 DIAGNOSIS — R0602 Shortness of breath: Secondary | ICD-10-CM

## 2024-03-17 DIAGNOSIS — Z6836 Body mass index (BMI) 36.0-36.9, adult: Secondary | ICD-10-CM

## 2024-03-17 DIAGNOSIS — N1832 Chronic kidney disease, stage 3b: Secondary | ICD-10-CM | POA: Diagnosis not present

## 2024-03-17 DIAGNOSIS — E038 Other specified hypothyroidism: Secondary | ICD-10-CM

## 2024-03-17 DIAGNOSIS — G4733 Obstructive sleep apnea (adult) (pediatric): Secondary | ICD-10-CM | POA: Diagnosis not present

## 2024-03-17 DIAGNOSIS — R002 Palpitations: Secondary | ICD-10-CM | POA: Diagnosis not present

## 2024-03-17 DIAGNOSIS — E782 Mixed hyperlipidemia: Secondary | ICD-10-CM

## 2024-03-17 MED ORDER — FUROSEMIDE 20 MG PO TABS: 20.0000 mg | ORAL_TABLET | Freq: Every day | ORAL | 11 refills | Status: AC

## 2024-03-17 NOTE — Progress Notes (Signed)
 "     Cardiology Office Note   Date:  03/17/2024   ID:  Madeline Martinez, DOB 03/16/1945, MRN [id number]  PCP:  Fernand Fredy RAMAN, MD  Cardiologist:  Denyse Fernand, MD      History of Present Illness: Madeline Martinez is a 79 y.o. female who presents for  Chief Complaint  Patient presents with   Follow-up    Nst, echo, and holter results    78YOBF, had no energy, and pain shooting to legs after stress test. But it better now.      Past Medical History:  Diagnosis Date   Acid reflux    Anxiety    Depression    Diabetes mellitus    Dysrhythmia    Gout    Heart murmur    HTN (hypertension)    Hyperlipidemia    Sleep apnea      Past Surgical History:  Procedure Laterality Date   ABDOMINAL HYSTERECTOMY     BACK SURGERY  02/26/2005   DISCS   BIOPSY  10/08/2018   Procedure: BIOPSY;  Surgeon: Wilhelmenia Aloha Raddle., MD;  Location: [facility];  Service: Gastroenterology;;   BREAST EXCISIONAL BIOPSY Left    COLONOSCOPY N/A 02/05/2024   Procedure: COLONOSCOPY;  Surgeon: Therisa Bi, MD;  Location: [facility] ENDOSCOPY;  Service: Gastroenterology;  Laterality: N/A;   COLONOSCOPY WITH PROPOFOL  N/A 12/19/2017   Procedure: COLONOSCOPY WITH PROPOFOL ;  Surgeon: Therisa Bi, MD;  Location: [facility] ENDOSCOPY;  Service: Gastroenterology;  Laterality: N/A;   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   ENDOSCOPIC MUCOSAL RESECTION N/A 10/08/2018   Procedure: ENDOSCOPIC MUCOSAL RESECTION;  Surgeon: Wilhelmenia Aloha Raddle., MD;  Location: [facility] ENDOSCOPY;  Service: Gastroenterology;  Laterality: N/A;   ESOPHAGEAL DILATION  02/05/2024   Procedure: ESOPHAGOSCOPY, WITH ESOPHAGEAL DILATION;  Surgeon: Therisa Bi, MD;  Location: [facility] ENDOSCOPY;  Service: Gastroenterology;;   ESOPHAGOGASTRODUODENOSCOPY N/A 02/05/2024   Procedure: EGD (ESOPHAGOGASTRODUODENOSCOPY);  Surgeon: Therisa Bi, MD;  Location[facility];  Service: Gastroenterology;  Laterality: N/A;   ESOPHAGOGASTRODUODENOSCOPY (EGD)  WITH PROPOFOL  N/A 08/28/2018   Procedure: ESOPHAGOGASTRODUODENOSCOPY (EGD) WITH PROPOFOL ;  Surgeon: Therisa Bi, MD;  Location: [facility] ENDOSCOPY;  Service: Gastroenterology;  Laterality: N/A;   ESOPHAGOGASTRODUODENOSCOPY (EGD) WITH PROPOFOL  N/A 10/08/2018   Procedure: ESOPHAGOGASTRODUODENOSCOPY (EGD) WITH PROPOFOL ;  Surgeon: Wilhelmenia Aloha Raddle., MD;  Location: [facility] ENDOSCOPY;  Service: Gastroenterology;  Laterality: N/A;   EUS N/A 10/08/2018   Procedure: UPPER ENDOSCOPIC ULTRASOUND (EUS) RADIAL;  Surgeon: Wilhelmenia Aloha Raddle., MD;  Location: [facility] ENDOSCOPY;  Service: Gastroenterology;  Laterality: N/A;   PARTIAL HYSTERECTOMY  02/27/1984   POLYPECTOMY  10/08/2018   Procedure: POLYPECTOMY;  Surgeon: Mansouraty, Aloha Raddle., MD;  Location: [facility] ENDOSCOPY;  Service: Gastroenterology;;     Current Outpatient Medications  Medication Sig Dispense Refill   albuterol  (PROVENTIL ) (2.5 MG/3ML) 0.083% nebulizer solution Take 3 mLs (2.5 mg total) by nebulization every 4 (four) hours as needed for wheezing or shortness of breath (please include nebulizer machine, hoses, and mask if needed.). 60 mL 0   albuterol  (VENTOLIN  HFA) 108 (90 Base) MCG/ACT inhaler Inhale 2 puffs into the lungs every 4 (four) hours as needed for wheezing or shortness of breath. 18 g 0   allopurinol  (ZYLOPRIM ) 100 MG tablet Take 100 mg by mouth daily with lunch.  (Patient taking differently: Take 100 mg by mouth as needed.)     ascorbic acid  (VITAMIN C ) 1000 MG tablet Take 1,000 mg by mouth 3 (three) times daily.     aspirin  81  MG EC tablet Take 81 mg by mouth daily.     atropine  1 % ophthalmic solution Place 1 drop into the right eye 2 (two) times daily.     Biotin 5000 MCG TABS Take 5,000 mcg by mouth daily. HAIR/SKIN/NAILS     carvedilol  (COREG ) 25 MG tablet Take 25 mg by mouth 2 (two) times daily.     cetirizine  (ZYRTEC ) 10 MG tablet Take 1 tablet (10 mg total) by mouth daily. 90 tablet 3   Cholecalciferol  (VITAMIN D -3) 125 MCG  (5000 UT) TABS Take 5,000 Units by mouth daily.     cyanocobalamin  (VITAMIN B12) 1000 MCG/ML injection Inject 1 mL (1,000 mcg total) into the muscle every 30 (thirty) days. 4 mL 1   cyclobenzaprine (FLEXERIL) 10 MG tablet Take 10 mg by mouth.     donepezil (ARICEPT) 10 MG tablet Take 10 mg by mouth at bedtime.     empagliflozin  (JARDIANCE ) 25 MG TABS tablet Take 1 tablet (25 mg total) by mouth daily before breakfast. 30 tablet 6   EPINEPHrine 0.3 mg/0.3 mL IJ SOAJ injection once as needed     FLUoxetine  (PROZAC ) 40 MG capsule Take 40 mg by mouth daily.     Fluticasone -Umeclidin-Vilant (TRELEGY ELLIPTA) 200-62.5-25 MCG/ACT AEPB Inhale 1 puff into the lungs.     furosemide  (LASIX ) 20 MG tablet Take 1 tablet (20 mg total) by mouth daily. 30 tablet 11   glipiZIDE  (GLUCOTROL  XL) 5 MG 24 hr tablet Take 1 tablet (5 mg total) by mouth daily. 90 tablet 3   hydrochlorothiazide  (HYDRODIURIL ) 25 MG tablet Take 1 tablet (25 mg total) by mouth daily. 90 tablet 3   HYDROcodone -acetaminophen  (NORCO) 10-325 MG tablet Take 1 tablet by mouth every 8 (eight) hours as needed.     levothyroxine  (SYNTHROID ) 25 MCG tablet Take 25 mcg by mouth daily.     LORazepam  (ATIVAN ) 1 MG tablet Take 1 tablet (1 mg total) by mouth 2 (two) times daily as needed for anxiety. 30 tablet 0   losartan  (COZAAR ) 100 MG tablet Take 100 mg by mouth daily. Dose decreased from 100 mg to 50 mg po daily. Monitor BP and increase dose when AKI resolve     metFORMIN (GLUCOPHAGE) 500 MG tablet Take 500 mg by mouth 2 (two) times daily.     mirabegron  ER (MYRBETRIQ ) 50 MG TB24 tablet Take 1 tablet (50 mg total) by mouth daily. 90 tablet 3   mirtazapine  (REMERON ) 15 MG tablet Take 15 mg by mouth at bedtime.     omeprazole  (PRILOSEC) 20 MG capsule Take 20 mg by mouth daily as needed.     polyethylene glycol (MIRALAX  / GLYCOLAX ) 17 g packet Take 17 g by mouth.     prednisoLONE  acetate (PRED FORTE ) 1 % ophthalmic suspension Place 1 drop into the right eye  2 (two) times daily.     pregabalin  (LYRICA ) 75 MG capsule Take 75 mg by mouth 3 (three) times daily.     Probiotic Product (PROBIOTIC PO) Take 1 tablet by mouth daily.     rosuvastatin  (CRESTOR ) 40 MG tablet Take 1 tablet (40 mg total) by mouth daily. 90 tablet 3   SYRINGE-NEEDLE, DISP, 3 ML (BD SAFETYGLIDE SYRINGE/NEEDLE) 25G X 1 3 ML MISC Use the needle for M injection once a month. 30 each 1   traZODone  (DESYREL ) 50 MG tablet Take 0.5 tablets (25 mg total) by mouth at bedtime. 15 tablet 1   vitamin E 45 MG (100 UNITS) capsule Take by mouth.  PRESCRIPTION MEDICATION Place 1 drop into the left eye 6 (six) times daily.     No current facility-administered medications for this visit.    Allergies:   Other    Social History:   reports that she has never smoked. She has never used smokeless tobacco. She reports that she does not drink alcohol and does not use drugs.   Family History:  family history includes Breast cancer in her maternal grandmother and mother; Heart attack in her brother and sister; Heart failure in her father; Other in her sister.    ROS:     Review of Systems  Constitutional: Negative.   HENT: Negative.    Eyes: Negative.   Respiratory: Negative.    Gastrointestinal: Negative.   Genitourinary: Negative.   Musculoskeletal: Negative.   Skin: Negative.   Neurological: Negative.   Endo/Heme/Allergies: Negative.   Psychiatric/Behavioral: Negative.    All other systems reviewed and are negative.     All other systems are reviewed and negative.    PHYSICAL EXAM: VS:  BP 114/62   Pulse 94   Ht 5' (1.524 m)   Wt 183 lb 9.6 oz (83.3 kg)   SpO2 93%   BMI 35.86 kg/m  , BMI Body mass index is 35.86 kg/m. Last weight:  Wt Readings from Last 3 Encounters:  03/17/24 183 lb 9.6 oz (83.3 kg)  02/10/24 184 lb (83.5 kg)  02/05/24 186 lb 3.2 oz (84.5 kg)     Physical Exam Constitutional:      Appearance: Normal appearance.  Cardiovascular:     Rate and  Rhythm: Normal rate and regular rhythm.     Heart sounds: Normal heart sounds.  Pulmonary:     Effort: Pulmonary effort is normal.     Breath sounds: Normal breath sounds.  Musculoskeletal:     Right lower leg: No edema.     Left lower leg: No edema.  Neurological:     Mental Status: She is alert.       EKG:   Recent Labs: 04/13/2023: Magnesium  2.3 12/05/2023: ALT 19; BUN 15; Creatinine, Ser 1.31; Potassium 5.1; Sodium 139; TSH 2.610 02/10/2024: Hemoglobin 12.3; Platelet Count 335    Lipid Panel    Component Value Date/Time   CHOL 177 12/05/2023 1003   TRIG 244 (H) 12/05/2023 1003   HDL 49 12/05/2023 1003   LDLCALC 87 12/05/2023 1003      Other studies Reviewed: Additional studies/ records that were reviewed today include:  Review of the above records demonstrates:       No data to display            ASSESSMENT AND PLAN:    ICD-10-CM   1. Hypertension associated with diabetes (HCC)  E11.59 furosemide  (LASIX ) 20 MG tablet   I15.2     2. CKD stage 3b, GFR 30-44 ml/min (HCC)  N18.32 furosemide  (LASIX ) 20 MG tablet    3. Mixed diabetic hyperlipidemia associated with type 2 diabetes mellitus (HCC)  E11.69 furosemide  (LASIX ) 20 MG tablet   E78.2     4. OSA (obstructive sleep apnea)  G47.33 furosemide  (LASIX ) 20 MG tablet    5. Body mass index (BMI) of 36.0-36.9 in adult  Z68.36 furosemide  (LASIX ) 20 MG tablet    6. SOB (shortness of breath)  R06.02 furosemide  (LASIX ) 20 MG tablet    7. Palpitation  R00.2 furosemide  (LASIX ) 20 MG tablet    8. Nonrheumatic mitral valve regurgitation  I34.0 furosemide  (LASIX ) 20 MG tablet  9. Acute on chronic diastolic congestive heart failure (HCC)  I50.33 furosemide  (LASIX ) 20 MG tablet   Stress test normal. LVEF normal on echo but has grade 1 diastolic dysfunction, addlasix on top of jaurdiance.       Problem List Items Addressed This Visit       Cardiovascular and Mediastinum   Hypertension associated with  diabetes (HCC) - Primary   Relevant Medications   furosemide  (LASIX ) 20 MG tablet     Respiratory   OSA (obstructive sleep apnea)   Relevant Medications   furosemide  (LASIX ) 20 MG tablet     Endocrine   Mixed diabetic hyperlipidemia associated with type 2 diabetes mellitus (HCC)   Relevant Medications   furosemide  (LASIX ) 20 MG tablet     Other   Body mass index (BMI) of 36.0-36.9 in adult   Relevant Medications   furosemide  (LASIX ) 20 MG tablet   Other Visit Diagnoses       CKD stage 3b, GFR 30-44 ml/min (HCC)       Relevant Medications   furosemide  (LASIX ) 20 MG tablet     SOB (shortness of breath)       Relevant Medications   furosemide  (LASIX ) 20 MG tablet     Palpitation       Relevant Medications   furosemide  (LASIX ) 20 MG tablet     Nonrheumatic mitral valve regurgitation       Relevant Medications   furosemide  (LASIX ) 20 MG tablet     Acute on chronic diastolic congestive heart failure (HCC)       Stress test normal. LVEF normal on echo but has grade 1 diastolic dysfunction, addlasix on top of jaurdiance.   Relevant Medications   furosemide  (LASIX ) 20 MG tablet          Disposition:   Return in about 5 weeks (around 04/21/2024).    Total time spent: 35 minutes  Signed,  Denyse Bathe, MD  03/17/2024 10:01 AM    [facility] "

## 2024-03-18 LAB — CMP14+EGFR
ALT: 27 IU/L (ref 0–32)
AST: 21 IU/L (ref 0–40)
Albumin: 4.6 g/dL (ref 3.8–4.8)
Alkaline Phosphatase: 80 IU/L (ref 49–135)
BUN/Creatinine Ratio: 14 (ref 12–28)
BUN: 25 mg/dL (ref 8–27)
Bilirubin Total: 0.3 mg/dL (ref 0.0–1.2)
CO2: 23 mmol/L (ref 20–29)
Calcium: 9.7 mg/dL (ref 8.7–10.3)
Chloride: 99 mmol/L (ref 96–106)
Creatinine, Ser: 1.83 mg/dL — ABNORMAL HIGH (ref 0.57–1.00)
Globulin, Total: 2.5 g/dL (ref 1.5–4.5)
Glucose: 114 mg/dL — ABNORMAL HIGH (ref 70–99)
Potassium: 5.2 mmol/L (ref 3.5–5.2)
Sodium: 140 mmol/L (ref 134–144)
Total Protein: 7.1 g/dL (ref 6.0–8.5)
eGFR: 28 mL/min/1.73 — ABNORMAL LOW

## 2024-03-18 LAB — CBC WITH DIFFERENTIAL/PLATELET
Basophils Absolute: 0.1 x10E3/uL (ref 0.0–0.2)
Basos: 1 %
EOS (ABSOLUTE): 0.7 x10E3/uL — ABNORMAL HIGH (ref 0.0–0.4)
Eos: 10 %
Hematocrit: 41.2 % (ref 34.0–46.6)
Hemoglobin: 13.1 g/dL (ref 11.1–15.9)
Immature Grans (Abs): 0 x10E3/uL (ref 0.0–0.1)
Immature Granulocytes: 0 %
Lymphocytes Absolute: 2.8 x10E3/uL (ref 0.7–3.1)
Lymphs: 44 %
MCH: 30.7 pg (ref 26.6–33.0)
MCHC: 31.8 g/dL (ref 31.5–35.7)
MCV: 97 fL (ref 79–97)
Monocytes Absolute: 0.5 x10E3/uL (ref 0.1–0.9)
Monocytes: 7 %
Neutrophils Absolute: 2.5 x10E3/uL (ref 1.4–7.0)
Neutrophils: 38 %
Platelets: 354 x10E3/uL (ref 150–450)
RBC: 4.27 x10E6/uL (ref 3.77–5.28)
RDW: 13.3 % (ref 11.7–15.4)
WBC: 6.5 x10E3/uL (ref 3.4–10.8)

## 2024-03-18 LAB — VITAMIN B12: Vitamin B-12: 1150 pg/mL (ref 232–1245)

## 2024-03-18 LAB — LIPID PANEL W/O CHOL/HDL RATIO
Cholesterol, Total: 174 mg/dL (ref 100–199)
HDL: 60 mg/dL
LDL Chol Calc (NIH): 86 mg/dL (ref 0–99)
Triglycerides: 167 mg/dL — ABNORMAL HIGH (ref 0–149)
VLDL Cholesterol Cal: 28 mg/dL (ref 5–40)

## 2024-03-18 LAB — TSH+T4F+T3FREE
Free T4: 0.92 ng/dL (ref 0.82–1.77)
T3, Free: 2.2 pg/mL (ref 2.0–4.4)
TSH: 2.35 u[IU]/mL (ref 0.450–4.500)

## 2024-03-18 LAB — HEMOGLOBIN A1C
Est. average glucose Bld gHb Est-mCnc: 146 mg/dL
Hgb A1c MFr Bld: 6.7 % — ABNORMAL HIGH (ref 4.8–5.6)

## 2024-03-19 ENCOUNTER — Ambulatory Visit: Admitting: Cardiovascular Disease

## 2024-03-20 ENCOUNTER — Ambulatory Visit: Payer: Self-pay | Admitting: Internal Medicine

## 2024-03-24 ENCOUNTER — Ambulatory Visit: Admitting: Internal Medicine

## 2024-03-31 ENCOUNTER — Ambulatory Visit

## 2024-03-31 VITALS — BP 130/78 | HR 88 | Ht 60.0 in

## 2024-03-31 DIAGNOSIS — I152 Hypertension secondary to endocrine disorders: Secondary | ICD-10-CM

## 2024-03-31 DIAGNOSIS — N1832 Chronic kidney disease, stage 3b: Secondary | ICD-10-CM | POA: Insufficient documentation

## 2024-03-31 DIAGNOSIS — M1000 Idiopathic gout, unspecified site: Secondary | ICD-10-CM

## 2024-03-31 DIAGNOSIS — E1169 Type 2 diabetes mellitus with other specified complication: Secondary | ICD-10-CM

## 2024-03-31 DIAGNOSIS — E782 Mixed hyperlipidemia: Secondary | ICD-10-CM

## 2024-03-31 DIAGNOSIS — E1165 Type 2 diabetes mellitus with hyperglycemia: Secondary | ICD-10-CM

## 2024-03-31 DIAGNOSIS — Z947 Corneal transplant status: Secondary | ICD-10-CM

## 2024-03-31 DIAGNOSIS — E1159 Type 2 diabetes mellitus with other circulatory complications: Secondary | ICD-10-CM

## 2024-03-31 MED ORDER — ALLOPURINOL 100 MG PO TABS: 100.0000 mg | ORAL_TABLET | Freq: Every day | ORAL | 3 refills | Status: AC

## 2024-03-31 MED ORDER — LOSARTAN POTASSIUM 100 MG PO TABS: 100.0000 mg | ORAL_TABLET | Freq: Every day | ORAL | 3 refills | Status: DC

## 2024-03-31 MED ORDER — TIRZEPATIDE 2.5 MG/0.5ML ~~LOC~~ SOAJ: 2.5000 mg | SUBCUTANEOUS | 0 refills | Status: AC

## 2024-03-31 NOTE — Assessment & Plan Note (Signed)
-   Refill allopurinol . Avoid high purine diet.

## 2024-03-31 NOTE — Progress Notes (Signed)
 "  Established Patient Office Visit  Subjective:  Patient ID: Madeline Martinez, female    DOB: 06/18/1945  Age: 79 y.o. MRN: [id number]  Chief Complaint  Patient presents with   Follow-up    3 month follow up    Patient is here today for follow up. She reports feeling well today and has no new concerns to discuss at this time.   Patient recently had labs and we discussed those in detail today. Her kidney function had declined significantly. Will recheck BMP to confirm worsening kidney function. Patient is established with nephrology and last saw them 11/2023. She reports taking her medications as prescribed without side effects. Reinforced drinking plenty of water daily.  Patient reports having difficulty with freestyle libre sensors falling off before they are due to be removed so she has not been checking her blood glucose levels. Advised patient to use finger stick glucose monitor at least once daily fasting if she does not have freestyle libre on to monitor blood sugar control. Patients HbgA1c has improved to 6.7% Due to decreased kidney function will stop metformin and glipizide  at this time. Patient was on Ozempic 0.5 mg weekly injection in the past and did not have any weight loss or glycemic control so it was stopped by a previous provider. Will send Mounjaro  2.5 mg weekly injection to compensate metformin and glipizide  discontinuation. Reinforced healthy diet and staying active. Discussed increasing to next dose in 1 month if she is tolerating the medication without GI upset. Also provided patient with 1 month sample box of Mounjaro  and educated patient on storage and proper use.  She denies chest pain, shortness of breath, palpitations, headaches, dizziness at this time. Colonoscopy is scheduled 04/2024    No other concerns at this time.   Past Medical History:  Diagnosis Date   Acid reflux    Anxiety    Depression    Diabetes mellitus    Dysrhythmia    Gout    Heart murmur     HTN (hypertension)    Hyperlipidemia    Sleep apnea     Past Surgical History:  Procedure Laterality Date   ABDOMINAL HYSTERECTOMY     BACK SURGERY  02/26/2005   DISCS   BIOPSY  10/08/2018   Procedure: BIOPSY;  Surgeon: Wilhelmenia Aloha Raddle., MD;  Location: [facility];  Service: Gastroenterology;;   BREAST EXCISIONAL BIOPSY Left    COLONOSCOPY N/A 02/05/2024   Procedure: COLONOSCOPY;  Surgeon: Therisa Bi, MD;  Location: [facility] ENDOSCOPY;  Service: Gastroenterology;  Laterality: N/A;   COLONOSCOPY WITH PROPOFOL  N/A 12/19/2017   Procedure: COLONOSCOPY WITH PROPOFOL ;  Surgeon: Therisa Bi, MD;  Location: [facility]COPY;  Service: Gastroenterology;  Laterality: N/A;   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 53   BILATERAL   ENDOSCOPIC MUCOSAL RESECTION N/A 10/08/2018   Procedure: ENDOSCOPIC MUCOSAL RESECTION;  Surgeon: Wilhelmenia Aloha Raddle., MD;  Location: [facility];  Service: Gastroenterology;  Laterality: N/A;   ESOPHAGEAL DILATION  02/05/2024   Procedure: ESOPHAGOSCOPY, WITH ESOPHAGEAL DILATION;  Surgeon: Therisa Bi, MD;  Location: [facility] ENDOSCOPY;  Service: Gastroenterology;;   ESOPHAGOGASTRODUODENOSCOPY N/A 02/05/2024   Procedure: EGD (ESOPHAGOGASTRODUODENOSCOPY);  Surgeon: Therisa Bi, MD;  Location: [facility] ENDOSCOPY;  Service: Gastroenterology;  Laterality: N/A;   ESOPHAGOGASTRODUODENOSCOPY (EGD) WITH PROPOFOL  N/A 08/28/2018   Procedure: ESOPHAGOGASTRODUODENOSCOPY (EGD) WITH PROPOFOL ;  Surgeon: Therisa Bi, MD;  Location: [facility] ENDOSCOPY;  Service: Gastroenterology;  Laterality: N/A;   ESOPHAGOGASTRODUODENOSCOPY (EGD) WITH PROPOFOL  N/A 10/08/2018   Procedure: ESOPHAGOGASTRODUODENOSCOPY (EGD) WITH PROPOFOL ;  Surgeon:  Mansouraty, Aloha Raddle., MD;  Location: [facility];  Service: Gastroenterology;  Laterality: N/A;   EUS N/A 10/08/2018   Procedure: UPPER ENDOSCOPIC ULTRASOUND (EUS) RADIAL;  Surgeon: Wilhelmenia Aloha Raddle., MD;  Location: [facility];  Service: Gastroenterology;   Laterality: N/A;   PARTIAL HYSTERECTOMY  02/27/1984   POLYPECTOMY  10/08/2018   Procedure: POLYPECTOMY;  Surgeon: Mansouraty, Aloha Raddle., MD;  Location: [facility];  Service: Gastroenterology;;    Social History   Socioeconomic History   Marital status: Single    Spouse name: Not on file   Number of children: 2   Years of education: Not on file   Highest education level: Not on file  Occupational History   Not on file  Tobacco Use   Smoking status: Never   Smokeless tobacco: Never  Vaping Use   Vaping status: Never Used  Substance and Sexual Activity   Alcohol use: No   Drug use: No   Sexual activity: Not Currently    Birth control/protection: Surgical    Comment: HYST  Other Topics Concern   Not on file  Social History Narrative   Not on file   Social Drivers of Health   Tobacco Use: Low Risk (03/31/2024)   Patient History    Smoking Tobacco Use: Never    Smokeless Tobacco Use: Never    Passive Exposure: Not on file  Financial Resource Strain: Low Risk (03/17/2024)   Received from [facility]   Overall Financial Resource Strain (CARDIA)    How hard is it for you to pay for the very basics like food, housing, medical care, and heating?: Not hard at all  Food Insecurity: No Food Insecurity (03/17/2024)   Received from [facility]   Epic    Within the past 12 months, you worried that your food would run out before you got the money to buy more.: Never true    Within the past 12 months, the food you bought just didn't last and you didn't have money to get more.: Never true  Transportation Needs: No Transportation Needs (03/17/2024)   Received from [facility]    In the past 12 months, has lack of transportation kept you from medical appointments or from getting medications?: No    In the past 12 months, has lack of transportation kept you from meetings, work, or from getting things needed for daily living?: No  Physical Activity: Not on file  Stress: Not  on file  Social Connections: Unknown (07/26/2023)   Social Connection and Isolation Panel    Frequency of Communication with Friends and Family: Never    Frequency of Social Gatherings with Friends and Family: Never    Attends Religious Services: Never    Database Administrator or Organizations: No    Attends Banker Meetings: Never    Marital Status: Patient declined  Catering Manager Violence: Not At Risk (07/26/2023)   Humiliation, Afraid, Rape, and Kick questionnaire    Fear of Current or Ex-Partner: No    Emotionally Abused: No    Physically Abused: No    Sexually Abused: No  Depression (PHQ2-9): Low Risk (02/10/2024)   Depression (PHQ2-9)    PHQ-2 Score: 0  Alcohol Screen: Low Risk (07/26/2023)   Alcohol Screen    Last Alcohol Screening Score (AUDIT): 0  Housing: Low Risk (03/17/2024)   Received from [facility]    In the last 12 months, was there a time when you were not  able to pay the mortgage or rent on time?: No    In the past 12 months, how many times have you moved where you were living?: 0    At any time in the past 12 months, were you homeless or living in a shelter (including now)?: No  Utilities: Not At Risk (03/17/2024)   Received from [facility]    In the past 12 months has the electric, gas, oil, or water company threatened to shut off services in your home?: No  Health Literacy: Not on file    Family History  Problem Relation Age of Onset   Breast cancer Mother    Heart failure Father    Other Sister        DEGEN.DISC DISEASE   Heart attack Sister    Heart attack Brother    Breast cancer Maternal Grandmother    Kidney cancer Neg Hx    Kidney disease Neg Hx    Prostate cancer Neg Hx     Allergies[1]  Review of Systems  Constitutional:  Negative for malaise/fatigue.  HENT: Negative.    Eyes:  Negative for blurred vision and pain.  Respiratory:  Negative for cough and shortness of breath.   Cardiovascular:  Negative  for chest pain, palpitations, claudication and leg swelling.  Gastrointestinal:  Negative for abdominal pain, blood in stool, constipation, diarrhea, nausea and vomiting.  Genitourinary:  Negative for dysuria, frequency and urgency.  Musculoskeletal: Negative.   Skin: Negative.   Neurological:  Negative for dizziness, tingling, sensory change and headaches.  Endo/Heme/Allergies: Negative.   Psychiatric/Behavioral: Negative.         Objective:   BP 130/78   Pulse 88   Ht 5' (1.524 m)   SpO2 96%   BMI 35.86 kg/m   Vitals:   03/31/24 1337  BP: 130/78  Pulse: 88  Height: 5' (1.524 m)  Weight: Comment: patient refused to weigh  SpO2: 96%    Physical Exam Vitals and nursing note reviewed.  Constitutional:      Appearance: Normal appearance.  HENT:     Head: Normocephalic.  Eyes:     Extraocular Movements: Extraocular movements intact.     Pupils: Pupils are equal, round, and reactive to light.  Cardiovascular:     Rate and Rhythm: Normal rate and regular rhythm.     Pulses: Normal pulses.     Heart sounds: Normal heart sounds. No murmur heard. Pulmonary:     Effort: Pulmonary effort is normal. No respiratory distress.     Breath sounds: Normal breath sounds.  Abdominal:     General: There is no distension.     Tenderness: There is no abdominal tenderness.  Musculoskeletal:        General: No tenderness. Normal range of motion.     Cervical back: Normal range of motion and neck supple.     Right lower leg: No edema.     Left lower leg: No edema.  Skin:    General: Skin is warm and dry.     Coloration: Skin is not jaundiced.     Findings: No erythema.  Neurological:     General: No focal deficit present.     Mental Status: She is alert and oriented to person, place, and time.  Psychiatric:        Mood and Affect: Mood normal.        Speech: Speech normal.        Behavior: Behavior is cooperative.  Cognition and Memory: Memory is not impaired.       No results found for any visits on 03/31/24.  Recent Results (from the past 2160 hours)  Glucose, capillary     Status: Abnormal   Collection Time: 02/05/24 10:13 AM  Result Value Ref Range   Glucose-Capillary 139 (H) 70 - 99 mg/dL    Comment: Glucose reference range applies only to samples taken after fasting for at least 8 hours.  Vitamin B12     Status: None   Collection Time: 02/10/24  1:07 PM  Result Value Ref Range   Vitamin B-12 340 180 - 914 pg/mL    Comment: (NOTE) This assay is not validated for testing neonatal or myeloproliferative syndrome specimens for Vitamin B12 levels. Performed at [facility], [address] [place], [place] [address]   Iron  and TIBC     Status: None   Collection Time: 02/10/24  1:07 PM  Result Value Ref Range   Iron  69 28 - 170 ug/dL   TIBC 702 749 - 549 ug/dL   Saturation Ratios 23 10.4 - 31.8 %   UIBC 228 ug/dL    Comment: Performed at [facility], [address] [place], [place] [address]  Ferritin     Status: None   Collection Time: 02/10/24  1:07 PM  Result Value Ref Range   Ferritin 277 11 - 307 ng/mL    Comment: Performed at [facility], [address] [place], [place] [address]  CBC (Cancer Center Only)     Status: None   Collection Time: 02/10/24  1:07 PM  Result Value Ref Range   WBC Count 7.3 4.0 - 10.5 K/uL   RBC 4.05 3.87 - 5.11 MIL/uL   Hemoglobin 12.3 12.0 - 15.0 g/dL   HCT 60.1 63.9 - 53.9 %   MCV 98.3 80.0 - 100.0 fL   MCH 30.4 26.0 - 34.0 pg   MCHC 30.9 30.0 - 36.0 g/dL   RDW 85.0 88.4 - 84.4 %   Platelet Count 335 150 - 400 K/uL   nRBC 0.0 0.0 - 0.2 %    Comment: Performed at [facility], [address] [place], [place] [address]  CMP14+EGFR     Status: Abnormal   Collection Time: 03/17/24 11:16 AM  Result Value Ref Range   Glucose 114 (H) 70 - 99 mg/dL   BUN 25 8 - 27 mg/dL   Creatinine, Ser 8.16 (H) 0.57 - 1.00 mg/dL   eGFR 28 (L) >40 fO/fpw/8.26    BUN/Creatinine Ratio 14 12 - 28   Sodium 140 134 - 144 mmol/L   Potassium 5.2 3.5 - 5.2 mmol/L   Chloride 99 96 - 106 mmol/L   CO2 23 20 - 29 mmol/L   Calcium  9.7 8.7 - 10.3 mg/dL   Total Protein 7.1 6.0 - 8.5 g/dL   Albumin 4.6 3.8 - 4.8 g/dL   Globulin, Total 2.5 1.5 - 4.5 g/dL   Bilirubin Total 0.3 0.0 - 1.2 mg/dL   Alkaline Phosphatase 80 49 - 135 IU/L   AST 21 0 - 40 IU/L   ALT 27 0 - 32 IU/L  Lipid Panel w/o Chol/HDL Ratio     Status: Abnormal   Collection Time: 03/17/24 11:16 AM  Result Value Ref Range   Cholesterol, Total 174 100 - 199 mg/dL   Triglycerides 832 (H) 0 - 149 mg/dL   HDL 60 >60 mg/dL   VLDL Cholesterol Cal 28 5 - 40  mg/dL   LDL Chol Calc (NIH) 86 0 - 99 mg/dL  Vitamin B12     Status: None   Collection Time: 03/17/24 11:16 AM  Result Value Ref Range   Vitamin B-12 1,150 232 - 1,245 pg/mL  CBC with Diff     Status: Abnormal   Collection Time: 03/17/24 11:16 AM  Result Value Ref Range   WBC 6.5 3.4 - 10.8 x10E3/uL   RBC 4.27 3.77 - 5.28 x10E6/uL   Hemoglobin 13.1 11.1 - 15.9 g/dL   Hematocrit 58.7 65.9 - 46.6 %   MCV 97 79 - 97 fL   MCH 30.7 26.6 - 33.0 pg   MCHC 31.8 31.5 - 35.7 g/dL   RDW 86.6 88.2 - 84.5 %   Platelets 354 150 - 450 x10E3/uL   Neutrophils 38 Not Estab. %   Lymphs 44 Not Estab. %   Monocytes 7 Not Estab. %   Eos 10 Not Estab. %   Basos 1 Not Estab. %   Neutrophils Absolute 2.5 1.4 - 7.0 x10E3/uL   Lymphocytes Absolute 2.8 0.7 - 3.1 x10E3/uL   Monocytes Absolute 0.5 0.1 - 0.9 x10E3/uL   EOS (ABSOLUTE) 0.7 (H) 0.0 - 0.4 x10E3/uL   Basophils Absolute 0.1 0.0 - 0.2 x10E3/uL   Immature Granulocytes 0 Not Estab. %   Immature Grans (Abs) 0.0 0.0 - 0.1 x10E3/uL  Hemoglobin A1c     Status: Abnormal   Collection Time: 03/17/24 11:16 AM  Result Value Ref Range   Hgb A1c MFr Bld 6.7 (H) 4.8 - 5.6 %    Comment:          Prediabetes: 5.7 - 6.4          Diabetes: >6.4          Glycemic control for adults with diabetes: <7.0    Est.  average glucose Bld gHb Est-mCnc 146 mg/dL  [id number]     Status: None   Collection Time: 03/17/24 11:16 AM  Result Value Ref Range   TSH 2.350 0.450 - 4.500 uIU/mL   T3, Free 2.2 2.0 - 4.4 pg/mL   Free T4 0.92 0.82 - 1.77 ng/dL       Assessment & Plan:   Assessment & Plan Idiopathic gout, unspecified chronicity, unspecified site - Refill allopurinol . Avoid high purine diet. Hypertension associated with diabetes (HCC) Type 2 diabetes mellitus with hyperglycemia, without long-term current use of insulin  (HCC) Mixed diabetic hyperlipidemia associated with type 2 diabetes mellitus (HCC) CKD stage 3b, GFR 30-44 ml/min (HCC) - Continue healthy diet and exercise as tolerated. - Stop glipizide ; stop metformin. Start Mounjaro  2.5 mg weekly injection. Continue medications as prescribed. - Discussed labs in detail today. Recheck BMP today. History of cornea transplant - Keep specialists appointments as scheduled; get eye exams completed as recommended.    Return in about 4 weeks (around 04/28/2024) for Medicare AWV.   Total time spent: 30 minutes  Oddis DELENA Cain, FNP  03/31/2024   This document may have been prepared by [facility] Voice Recognition software and as such may include unintentional dictation errors.     [1]  Allergies Allergen Reactions   Other Other (See Comments)    Seasonal allergy   "

## 2024-03-31 NOTE — Assessment & Plan Note (Signed)
-   Keep specialists appointments as scheduled; get eye exams completed as recommended.

## 2024-04-01 ENCOUNTER — Ambulatory Visit: Payer: Self-pay

## 2024-04-01 ENCOUNTER — Other Ambulatory Visit: Payer: Self-pay

## 2024-04-01 DIAGNOSIS — E1159 Type 2 diabetes mellitus with other circulatory complications: Secondary | ICD-10-CM

## 2024-04-01 LAB — BASIC METABOLIC PANEL WITH GFR
BUN/Creatinine Ratio: 15 (ref 12–28)
BUN: 29 mg/dL — ABNORMAL HIGH (ref 8–27)
CO2: 26 mmol/L (ref 20–29)
Calcium: 9.8 mg/dL (ref 8.7–10.3)
Chloride: 97 mmol/L (ref 96–106)
Creatinine, Ser: 1.93 mg/dL — ABNORMAL HIGH (ref 0.57–1.00)
Glucose: 196 mg/dL — ABNORMAL HIGH (ref 70–99)
Potassium: 5.2 mmol/L (ref 3.5–5.2)
Sodium: 139 mmol/L (ref 134–144)
eGFR: 26 mL/min/{1.73_m2} — ABNORMAL LOW

## 2024-04-01 MED ORDER — LOSARTAN POTASSIUM 100 MG PO TABS: 100.0000 mg | ORAL_TABLET | Freq: Every day | ORAL | 3 refills | Status: AC

## 2024-04-01 NOTE — Progress Notes (Signed)
 Patient notified

## 2024-04-28 ENCOUNTER — Ambulatory Visit: Admitting: Internal Medicine

## 2024-04-30 ENCOUNTER — Ambulatory Visit: Admitting: Cardiovascular Disease

## 2024-05-20 ENCOUNTER — Ambulatory Visit: Admission: RE | Admit: 2024-05-20 | Source: Home / Self Care | Admitting: Gastroenterology

## 2024-05-20 ENCOUNTER — Encounter: Admission: RE | Payer: Self-pay | Source: Home / Self Care

## 2024-06-10 ENCOUNTER — Inpatient Hospital Stay

## 2024-10-12 ENCOUNTER — Inpatient Hospital Stay

## 2024-10-12 ENCOUNTER — Inpatient Hospital Stay: Admitting: Oncology
